# Patient Record
Sex: Male | Born: 1935
Health system: Southern US, Community
[De-identification: ages and names within clinical notes are randomized; demographics above are authoritative.]

## PROBLEM LIST (undated history)

## (undated) DIAGNOSIS — N189 Chronic kidney disease, unspecified: Secondary | ICD-10-CM

## (undated) DIAGNOSIS — M109 Gout, unspecified: Secondary | ICD-10-CM

## (undated) DIAGNOSIS — E785 Hyperlipidemia, unspecified: Secondary | ICD-10-CM

## (undated) DIAGNOSIS — R0600 Dyspnea, unspecified: Secondary | ICD-10-CM

## (undated) DIAGNOSIS — K219 Gastro-esophageal reflux disease without esophagitis: Secondary | ICD-10-CM

## (undated) DIAGNOSIS — R0609 Other forms of dyspnea: Secondary | ICD-10-CM

## (undated) DIAGNOSIS — J439 Emphysema, unspecified: Secondary | ICD-10-CM

## (undated) DIAGNOSIS — R9439 Abnormal result of other cardiovascular function study: Secondary | ICD-10-CM

## (undated) DIAGNOSIS — J449 Chronic obstructive pulmonary disease, unspecified: Secondary | ICD-10-CM

## (undated) DIAGNOSIS — I10 Essential (primary) hypertension: Secondary | ICD-10-CM

## (undated) HISTORY — DX: Chronic kidney disease, unspecified: N18.9

## (undated) HISTORY — DX: Gout, unspecified: M10.9

## (undated) HISTORY — DX: Abnormal result of other cardiovascular function study: R94.39

## (undated) HISTORY — DX: Chronic obstructive pulmonary disease, unspecified: J44.9

## (undated) HISTORY — PX: APPENDECTOMY: SHX54

## (undated) HISTORY — DX: Dyspnea, unspecified: R06.00

## (undated) HISTORY — DX: Other forms of dyspnea: R06.09

## (undated) HISTORY — DX: Essential (primary) hypertension: I10

## (undated) HISTORY — DX: Gastro-esophageal reflux disease without esophagitis: K21.9

## (undated) HISTORY — DX: Emphysema, unspecified: J43.9

## (undated) HISTORY — PX: HERNIA REPAIR: SHX51

## (undated) HISTORY — DX: Hyperlipidemia, unspecified: E78.5

---

## 2004-02-25 ENCOUNTER — Ambulatory Visit (HOSPITAL_COMMUNITY): Admission: RE | Admit: 2004-02-25 | Discharge: 2004-02-25 | Payer: Self-pay | Admitting: Gastroenterology

## 2011-07-19 ENCOUNTER — Other Ambulatory Visit: Payer: Self-pay | Admitting: Family Medicine

## 2012-11-15 ENCOUNTER — Encounter: Payer: Self-pay | Admitting: Cardiology

## 2013-05-21 ENCOUNTER — Encounter: Payer: Self-pay | Admitting: Cardiology

## 2013-09-03 ENCOUNTER — Encounter (INDEPENDENT_AMBULATORY_CARE_PROVIDER_SITE_OTHER): Payer: Self-pay

## 2013-09-03 ENCOUNTER — Encounter: Payer: Self-pay | Admitting: Cardiology

## 2013-09-03 ENCOUNTER — Ambulatory Visit (INDEPENDENT_AMBULATORY_CARE_PROVIDER_SITE_OTHER): Payer: Commercial Managed Care - HMO | Admitting: Cardiology

## 2013-09-03 VITALS — BP 142/80 | HR 79 | Ht 72.0 in | Wt 200.2 lb

## 2013-09-03 DIAGNOSIS — R0609 Other forms of dyspnea: Secondary | ICD-10-CM

## 2013-09-03 DIAGNOSIS — E785 Hyperlipidemia, unspecified: Secondary | ICD-10-CM | POA: Insufficient documentation

## 2013-09-03 DIAGNOSIS — I10 Essential (primary) hypertension: Secondary | ICD-10-CM | POA: Insufficient documentation

## 2013-09-03 DIAGNOSIS — R079 Chest pain, unspecified: Secondary | ICD-10-CM

## 2013-09-03 NOTE — Patient Instructions (Signed)
We will schedule you for a stress myoview study.   

## 2013-09-03 NOTE — Progress Notes (Signed)
   Charles Archer Date of Birth: 07/04/1936 Medical Record #[id number]  History of Present Illness: [name] is seen at the request of Dr. Tenny Craw for evaluation of dyspnea. He is a pleasant 77 year old white male who has a history of hyperlipidemia and hypertension. He reports that he has had slowly progressive dyspnea on exertion for the past 5 years. He does have a history of tobacco abuse. He quit smoking in 1990 but prior to that had smoked as much as 3 packs per day. He reports he was tried on Symbicort without any improvement. He has also been on a nitroglycerin patch for the past month without any improvement. He denies any significant chest pain or discomfort. He has no cough. He admits that he is fairly sedentary. The extent of his exertion is when he walks from his cough cart up to the green while playing golf.  Medications: Lisinopril 40 mg daily, Albuterol inhaler when necessary Niacin 250 mg daily Allopurinol 100 mg daily Symbicort 160/4.52 puffs twice a day Nitroglycerin patch 0.2 mg per hour daily Patient is on multiple over-the-counter supplements including vitamin B complex, Gingko biloba, zinc, Osteo Bi-Flex  Past Medical History  Diagnosis Date  . HTN (hypertension)   . Hyperlipidemia   . Gout   . GERD (gastroesophageal reflux disease)   . CKD (chronic kidney disease)     Past Surgical History  Procedure Laterality Date  . Hernia repair    . Appendectomy      History  Smoking status  . Former Smoker  . Quit date: 09/03/1989  Smokeless tobacco  . Not on file    History  Alcohol Use: Not on file    History reviewed. No pertinent family history.  Review of Systems: As noted in history of present illness.  All other systems were reviewed and are negative.  Physical Exam: BP 142/80  Pulse 79  Ht 6' (1.829 m)  Wt 200 lb 4 oz (90.833 kg)  BMI 27.15 kg/m2 He is a pleasant overweight white male in no acute distress. HEENT: Normocephalic, atraumatic.  Pupils equal round and reactive light accommodation. Extraocular movements are full. Oropharynx is clear. Neck is supple no JVD, adenopathy, thyromegaly, or bruits. Lungs: Clear Cardiovascular: Regular rate and rhythm, normal S1 and S2, no gallop or murmur. PMI is normal. Abdomen: Soft and nontender. No masses or bruits. Bowel sounds are positive. Extremities: No cyanosis or edema. Pulses are 2+ and symmetric. Skin: Warm and dry Neuro: Alert oriented x3. Cranial nerves II through XII are intact.  LABORATORY DATA: ECG today demonstrates normal sinus rhythm with PACs. He has a right bundle branch block.  Assessment / Plan: 1. Dyspnea on exertion. I suspect this is mostly related to COPD and/or deconditioning. These could be anginal equivalent symptoms with history of hypertension and dyslipidemia. He reports that he has had chest x-ray and complete lab work with Dr. Tenny Craw. I do not have these results. We will schedule him for a stress Myoview study. If this is normal then I would recommend further treatment for his COPD and a regular aerobic exercise program. 2. Hypertension-controlled 3. Mild dyslipidemia.

## 2013-09-19 ENCOUNTER — Encounter: Payer: Self-pay | Admitting: Cardiology

## 2013-09-19 ENCOUNTER — Ambulatory Visit (HOSPITAL_COMMUNITY): Payer: Medicare HMO | Attending: Cardiovascular Disease | Admitting: Radiology

## 2013-09-19 VITALS — BP 116/90 | HR 84 | Ht 72.0 in | Wt 198.0 lb

## 2013-09-19 DIAGNOSIS — R079 Chest pain, unspecified: Secondary | ICD-10-CM

## 2013-09-19 DIAGNOSIS — J449 Chronic obstructive pulmonary disease, unspecified: Secondary | ICD-10-CM | POA: Insufficient documentation

## 2013-09-19 DIAGNOSIS — Z87891 Personal history of nicotine dependence: Secondary | ICD-10-CM | POA: Insufficient documentation

## 2013-09-19 DIAGNOSIS — I4510 Unspecified right bundle-branch block: Secondary | ICD-10-CM | POA: Insufficient documentation

## 2013-09-19 DIAGNOSIS — E785 Hyperlipidemia, unspecified: Secondary | ICD-10-CM | POA: Insufficient documentation

## 2013-09-19 DIAGNOSIS — R0602 Shortness of breath: Secondary | ICD-10-CM

## 2013-09-19 DIAGNOSIS — J4489 Other specified chronic obstructive pulmonary disease: Secondary | ICD-10-CM | POA: Insufficient documentation

## 2013-09-19 DIAGNOSIS — I10 Essential (primary) hypertension: Secondary | ICD-10-CM | POA: Insufficient documentation

## 2013-09-19 DIAGNOSIS — R0989 Other specified symptoms and signs involving the circulatory and respiratory systems: Secondary | ICD-10-CM | POA: Insufficient documentation

## 2013-09-19 DIAGNOSIS — R0609 Other forms of dyspnea: Secondary | ICD-10-CM | POA: Insufficient documentation

## 2013-09-19 MED ORDER — TECHNETIUM TC 99M SESTAMIBI GENERIC - CARDIOLITE
11.0000 | Freq: Once | INTRAVENOUS | Status: AC | PRN
  Administered 2013-09-19: 11 via INTRAVENOUS

## 2013-09-19 MED ORDER — TECHNETIUM TC 99M SESTAMIBI GENERIC - CARDIOLITE
33.0000 | Freq: Once | INTRAVENOUS | Status: AC | PRN
  Administered 2013-09-19: 33 via INTRAVENOUS

## 2013-09-19 NOTE — Progress Notes (Signed)
[facility] SITE [address] NUCLEAR MED [address] [place], [place] [address] [phone]    Cardiology Nuclear Med Study  Charles Archer is a 77 y.o. male     MRN : [id number]     DOB: 23-Aug-1936  Procedure Date: 09/19/2013  Nuclear Med Background Indication for Stress Test:  Evaluation for Ischemia History:  No known CAD, COPD Cardiac Risk Factors: History of Smoking, Hypertension, Lipids and RBBB  Symptoms:  DOE   Nuclear Pre-Procedure Caffeine/Decaff Intake:  None > 12 hrs NPO After: 7:00am   Lungs:  clear O2 Sat: 94% on room air. IV 0.9% NS with Angio Cath:  22g  IV Site: R Antecubital x 1, tolerated well IV Started by:  Irean Hong, RN  Chest Size (in):  42 Cup Size: n/a  Height: 6' (1.829 m)  Weight:  198 lb (89.812 kg)  BMI:  Body mass index is 26.85 kg/(m^2). Tech Comments:  Took am medications    Nuclear Med Study 1 or 2 day study: 1 day  Stress Test Type:  Stress  Reading MD: Tobias Alexander, MD  Order Authorizing Provider:  Peter Swaziland, MD  Resting Radionuclide: Technetium 12m Sestamibi  Resting Radionuclide Dose: 11.0 mCi   Stress Radionuclide:  Technetium 43m Sestamibi  Stress Radionuclide Dose: 33.0 mCi           Stress Protocol Rest HR: 84 Stress HR: 142  Rest BP: 116/90 Stress BP: 176/79  Exercise Time (min): 4:00 METS: 4.6           Dose of Adenosine (mg):  n/a Dose of Lexiscan: n/a mg  Dose of Atropine (mg): n/a Dose of Dobutamine: n/a mcg/kg/min (at max HR)  Stress Test Technologist: Nelson Chimes, BS-ES  Nuclear Technologist:  Dario Guardian, CNMT     Rest Procedure:  Myocardial perfusion imaging was performed at rest 45 minutes following the intravenous administration of Technetium 66m Sestamibi. Rest ECG: NSR with RBBB  Stress Procedure:  The patient exercised on the treadmill utilizing the Bruce Protocol for 4:00 minutes. The patient stopped due to severe SOB and denied any chest pain.  Technetium 66m Sestamibi was injected at peak  exercise and myocardial perfusion imaging was performed after a brief delay. Stress ECG: Insignificant upsloping ST depression anterolateral leads.   QPS Raw Data Images:  Normal; no motion artifact; normal heart/lung ratio. Stress Images:  Medium-sized, moderate basal to mid inferior perfusion defect.  Rest Images:  Medium-sized, moderate basal to mid inferior perfusion defect.  Subtraction (SDS):  Fixed, medium-sized basal to mid inferior perfusion defect. Transient Ischemic Dilatation (Normal <1.22):  0.98 Lung/Heart Ratio (Normal <0.45):  0.26  Quantitative Gated Spect Images QGS EDV:  n/a ml QGS ESV:  n/a ml  Impression Exercise Capacity:  Poor exercise capacity. BP Response:  Normal blood pressure response. Clinical Symptoms:  Very dyspneic.  ECG Impression:  Insignificant upsloping ST segment depression. Comparison with Prior Nuclear Study: No images to compare  Overall Impression:  Low risk stress nuclear study with a fixed, medium-sized moderate basal to mid inferior perfusion defect.  No ischemia. This may represent prior inferior MI.  LV Ejection Fraction: Study not gated.  LV Wall Motion:  ungated study.   Marca Ancona 09/22/2013

## 2013-09-26 ENCOUNTER — Other Ambulatory Visit: Payer: Self-pay

## 2013-09-26 DIAGNOSIS — R079 Chest pain, unspecified: Secondary | ICD-10-CM

## 2013-10-15 ENCOUNTER — Ambulatory Visit (HOSPITAL_COMMUNITY): Payer: Medicare HMO | Attending: Cardiology | Admitting: Radiology

## 2013-10-15 DIAGNOSIS — R072 Precordial pain: Secondary | ICD-10-CM

## 2013-10-15 DIAGNOSIS — E785 Hyperlipidemia, unspecified: Secondary | ICD-10-CM | POA: Insufficient documentation

## 2013-10-15 DIAGNOSIS — Z87891 Personal history of nicotine dependence: Secondary | ICD-10-CM | POA: Insufficient documentation

## 2013-10-15 DIAGNOSIS — I079 Rheumatic tricuspid valve disease, unspecified: Secondary | ICD-10-CM | POA: Insufficient documentation

## 2013-10-15 DIAGNOSIS — I10 Essential (primary) hypertension: Secondary | ICD-10-CM | POA: Insufficient documentation

## 2013-10-15 DIAGNOSIS — I359 Nonrheumatic aortic valve disorder, unspecified: Secondary | ICD-10-CM | POA: Insufficient documentation

## 2013-10-15 DIAGNOSIS — R079 Chest pain, unspecified: Secondary | ICD-10-CM

## 2013-10-15 DIAGNOSIS — R0609 Other forms of dyspnea: Secondary | ICD-10-CM | POA: Insufficient documentation

## 2013-10-15 DIAGNOSIS — R0989 Other specified symptoms and signs involving the circulatory and respiratory systems: Secondary | ICD-10-CM

## 2013-10-15 NOTE — Progress Notes (Signed)
Echocardiogram performed.  

## 2013-10-20 ENCOUNTER — Encounter: Payer: Self-pay | Admitting: Cardiology

## 2013-10-20 ENCOUNTER — Ambulatory Visit (INDEPENDENT_AMBULATORY_CARE_PROVIDER_SITE_OTHER): Payer: Medicare HMO | Admitting: Cardiology

## 2013-10-20 VITALS — BP 140/76 | HR 90 | Ht 72.0 in | Wt 205.0 lb

## 2013-10-20 DIAGNOSIS — R0609 Other forms of dyspnea: Secondary | ICD-10-CM

## 2013-10-20 DIAGNOSIS — R9439 Abnormal result of other cardiovascular function study: Secondary | ICD-10-CM

## 2013-10-20 DIAGNOSIS — I10 Essential (primary) hypertension: Secondary | ICD-10-CM

## 2013-10-20 DIAGNOSIS — R0989 Other specified symptoms and signs involving the circulatory and respiratory systems: Secondary | ICD-10-CM

## 2013-10-20 DIAGNOSIS — E785 Hyperlipidemia, unspecified: Secondary | ICD-10-CM

## 2013-10-20 NOTE — Progress Notes (Signed)
Charles Archer Date of Birth: January 08, 1936 Medical Record #[id number]  History of Present Illness: Charles Archer is seen for follow up of dyspnea. He  has a history of hyperlipidemia and hypertension. He reports that he has had slowly progressive dyspnea on exertion for the past 5 years. He does have a history of tobacco abuse. He quit smoking in 1990 but prior to that had smoked as much as 3 packs per day. He reports he was tried on Symbicort without any improvement. He has also been on a nitroglycerin patch for the past month without any improvement. He denies any significant chest pain or discomfort. He has no cough. He admits that he is fairly sedentary. He recently underwent cardiac evaluation with a stress Myoview and Echo as noted below.  Medications: Lisinopril 40 mg daily, Albuterol inhaler when necessary Niacin 250 mg daily Allopurinol 100 mg daily Symbicort 160/4.52 puffs twice a day Nitroglycerin patch 0.2 mg per hour daily Patient is on multiple over-the-counter supplements including vitamin B complex, Gingko biloba, zinc, Osteo Bi-Flex  Past Medical History  Diagnosis Date  . HTN (hypertension)   . Hyperlipidemia   . Gout   . GERD (gastroesophageal reflux disease)   . CKD (chronic kidney disease)   . Dyspnea on exertion   . COPD (chronic obstructive pulmonary disease)     Past Surgical History  Procedure Laterality Date  . Hernia repair    . Appendectomy      History  Smoking status  . Former Smoker  . Quit date: 09/03/1989  Smokeless tobacco  . Not on file    History  Alcohol Use: Not on file    History reviewed. No pertinent family history.  Review of Systems: As noted in history of present illness.  All other systems were reviewed and are negative.  Physical Exam: BP 140/76  Pulse 90  Ht 6' (1.829 m)  Wt 205 lb (92.987 kg)  BMI 27.80 kg/m2 He is a pleasant overweight white male in no acute distress. HEENT: Normocephalic, atraumatic. Pupils equal  round and reactive light accommodation. Extraocular movements are full. Oropharynx is clear. Neck is supple no JVD, adenopathy, thyromegaly, or bruits. Lungs: Clear Cardiovascular: Regular rate and rhythm, normal S1 and S2, no gallop or murmur. PMI is normal. Abdomen: Soft and nontender. No masses or bruits. Bowel sounds are positive. Extremities: No cyanosis or edema. Pulses are 2+ and symmetric. Skin: Warm and dry Neuro: Alert oriented x3. Cranial nerves II through XII are intact.  LABORATORY DATA: ECG today demonstrates normal sinus rhythm with PACs. He has a right bundle branch block.  Echo:Study Conclusions  - Left ventricle: The cavity size was normal. Wall thickness was increased in a pattern of mild LVH. The estimated ejection fraction was 45%. Diffuse hypokinesis. The basal inferior wall looks worse than other wall segments. Features are consistent with a pseudonormal left ventricular filling pattern, with concomitant abnormal relaxation and increased filling pressure (grade 2 diastolic dysfunction). - Aortic valve: There was no stenosis. Trivial regurgitation. - Mitral valve: No significant regurgitation. - Right ventricle: Systolic function was mildly reduced. - Tricuspid valve: Peak RV-RA gradient: 24mm Hg (S). - Pulmonary arteries: PA systolic pressure 30-34 mmHg. - Systemic veins: IVC measured 2.3 cm with normal respirophasic variation, suggesting RA pressure 6-10 mmHg. Impressions:  - Normal LV size with mild LV hypertrophy. EF 45% with diffuse hypokinesis (somewhat worse in the basal inferior wall). Moderate diastolic dysfunction. Normal RV size with mildly decreased systolic function. No significant valvular  abnormalities.  Cardiology Nuclear Med Study  Charles Archer is a 78 y.o. male MRN : [id number] DOB: 11-18-1935  Procedure Date: 09/19/2013  Nuclear Med Background  Indication for Stress Test: Evaluation for Ischemia  History: No known CAD, COPD  Cardiac  Risk Factors: History of Smoking, Hypertension, Lipids and RBBB  Symptoms: DOE  Nuclear Pre-Procedure  Caffeine/Decaff Intake: None > 12 hrs  NPO After: 7:00am   Lungs: clear  O2 Sat: 94% on room air.  IV 0.9% NS with Angio Cath: 22g   IV Site: R Antecubital x 1, tolerated well  IV Started by: [name], RN   Chest Size (in): 42  Cup Size: n/a   Height: 6' (1.829 m)  Weight: 198 lb (89.812 kg)   BMI: Body mass index is 26.85 kg/(m^2).  Tech Comments: Took am medications   Nuclear Med Study  1 or 2 day study: 1 day  Stress Test Type: Stress   Reading MD: [name], MD  Order Authorizing Provider: [name], MD   Resting Radionuclide: Technetium 7442m Sestamibi  Resting Radionuclide Dose: 11.0 mCi   Stress Radionuclide: Technetium 3142m Sestamibi  Stress Radionuclide Dose: 33.0 mCi   Stress Protocol  Rest HR: 84  Stress HR: 142   Rest BP: 116/90  Stress BP: 176/79   Exercise Time (min): 4:00  METS: 4.6    Dose of Adenosine (mg): n/a  Dose of Lexiscan: n/a mg   Dose of Atropine (mg): n/a  Dose of Dobutamine: n/a mcg/kg/min (at max HR)   Stress Test Technologist: [name], BS-ES  Nuclear Technologist: [name], CNMT   Rest Procedure: Myocardial perfusion imaging was performed at rest 45 minutes following the intravenous administration of Technetium 8542m Sestamibi.  Rest ECG: NSR with RBBB  Stress Procedure: The patient exercised on the treadmill utilizing the Bruce Protocol for 4:00 minutes. The patient stopped due to severe SOB and denied any chest pain. Technetium 4842m Sestamibi was injected at peak exercise and myocardial perfusion imaging was performed after a brief delay.  Stress ECG: Insignificant upsloping ST depression anterolateral leads.  QPS  Raw Data Images: Normal; no motion artifact; normal heart/lung ratio.  Stress Images: Medium-sized, moderate basal to mid inferior perfusion defect.  Rest Images: Medium-sized, moderate basal to mid inferior perfusion defect.   Subtraction (SDS): Fixed, medium-sized basal to mid inferior perfusion defect.  Transient Ischemic Dilatation (Normal <1.22): 0.98  Lung/Heart Ratio (Normal <0.45): 0.26  Quantitative Gated Spect Images  QGS EDV: n/a ml  QGS ESV: n/a ml  Impression  Exercise Capacity: Poor exercise capacity.  BP Response: Normal blood pressure response.  Clinical Symptoms: Very dyspneic.  ECG Impression: Insignificant upsloping ST segment depression.  Comparison with Prior Nuclear Study: No images to compare  Overall Impression: Low risk stress nuclear study with a fixed, medium-sized moderate basal to mid inferior perfusion defect. No ischemia. This may represent prior inferior MI.  LV Ejection Fraction: Study not gated. LV Wall Motion: ungated study.  Marca AnconaDalton Archer  09/22/2013     Assessment / Plan: 1. Dyspnea on exertion. Recent noninvasive cardiac evaluation demonstrates at least intermediate risk with poor exercise tolerance, fixed inferior wall defect consistent with infarct, and decreased LV systolic function. I have recommended a cardiac cath to further evaluate his coronary status. Recommended he start an ASA daily. Given his CKD will have him arrive early for IV hydration. The procedure and risks were reviewed including but not limited to death, myocardial infarction, stroke, arrythmias, bleeding, transfusion, emergency surgery,  dye allergy, or renal dysfunction. The patient voices understanding and is agreeable to proceed.. 2. Hypertension-controlled 3. Mild dyslipidemia. 4. CKD. Awaiting labs.

## 2013-10-20 NOTE — Patient Instructions (Signed)
Will schedule you for a cardiac catheterization. Hold lisinopril the day before and the day of the procedure.   Start ASA 81 mg daily.

## 2013-10-20 NOTE — Addendum Note (Signed)
Addended by: Tonita PhoenixBOWDEN, Kwabena Strutz K on: 10/20/2013 04:32 PM   Modules accepted: Orders

## 2013-10-21 ENCOUNTER — Encounter (HOSPITAL_COMMUNITY): Payer: Self-pay | Admitting: Pharmacy Technician

## 2013-10-21 LAB — CBC WITH DIFFERENTIAL/PLATELET
BASOS PCT: 0.7 % (ref 0.0–3.0)
Basophils Absolute: 0 10*3/uL (ref 0.0–0.1)
EOS PCT: 4 % (ref 0.0–5.0)
Eosinophils Absolute: 0.3 10*3/uL (ref 0.0–0.7)
HEMATOCRIT: 46.3 % (ref 39.0–52.0)
HEMOGLOBIN: 15.8 g/dL (ref 13.0–17.0)
Lymphocytes Relative: 37.6 % (ref 12.0–46.0)
Lymphs Abs: 2.6 10*3/uL (ref 0.7–4.0)
MCHC: 34.1 g/dL (ref 30.0–36.0)
MCV: 95.4 fl (ref 78.0–100.0)
MONOS PCT: 5.3 % (ref 3.0–12.0)
Monocytes Absolute: 0.4 10*3/uL (ref 0.1–1.0)
NEUTROS ABS: 3.6 10*3/uL (ref 1.4–7.7)
Neutrophils Relative %: 52.4 % (ref 43.0–77.0)
Platelets: 189 10*3/uL (ref 150.0–400.0)
RBC: 4.85 Mil/uL (ref 4.22–5.81)
RDW: 14.8 % — ABNORMAL HIGH (ref 11.5–14.6)
WBC: 6.8 10*3/uL (ref 4.5–10.5)

## 2013-10-21 LAB — BASIC METABOLIC PANEL
BUN: 16 mg/dL (ref 6–23)
CO2: 27 mEq/L (ref 19–32)
Calcium: 9.2 mg/dL (ref 8.4–10.5)
Chloride: 109 mEq/L (ref 96–112)
Creatinine, Ser: 1.5 mg/dL (ref 0.4–1.5)
GFR: 50.31 mL/min — AB (ref >60.00)
GLUCOSE: 104 mg/dL — AB (ref 70–99)
POTASSIUM: 4.3 meq/L (ref 3.5–5.1)
SODIUM: 142 meq/L (ref 135–145)

## 2013-10-21 LAB — PROTIME-INR
INR: 1.1 ratio — ABNORMAL HIGH (ref 0.8–1.0)
Prothrombin Time: 12.1 s (ref 10.2–12.4)

## 2013-10-22 ENCOUNTER — Encounter (HOSPITAL_COMMUNITY)
Admission: RE | Disposition: A | Discharge status: Home / Self Care | Payer: Self-pay | Source: Ambulatory Visit | Attending: Cardiology

## 2013-10-22 ENCOUNTER — Ambulatory Visit (HOSPITAL_COMMUNITY)
Admission: RE | Admit: 2013-10-22 | Discharge: 2013-10-22 | Disposition: A | Discharge status: Home / Self Care | Payer: Medicare HMO | Source: Ambulatory Visit | Attending: Cardiology | Admitting: Cardiology

## 2013-10-22 DIAGNOSIS — R9439 Abnormal result of other cardiovascular function study: Secondary | ICD-10-CM | POA: Insufficient documentation

## 2013-10-22 DIAGNOSIS — I2510 Atherosclerotic heart disease of native coronary artery without angina pectoris: Secondary | ICD-10-CM | POA: Insufficient documentation

## 2013-10-22 DIAGNOSIS — E785 Hyperlipidemia, unspecified: Secondary | ICD-10-CM | POA: Insufficient documentation

## 2013-10-22 DIAGNOSIS — R0989 Other specified symptoms and signs involving the circulatory and respiratory systems: Principal | ICD-10-CM | POA: Insufficient documentation

## 2013-10-22 DIAGNOSIS — J4489 Other specified chronic obstructive pulmonary disease: Secondary | ICD-10-CM | POA: Insufficient documentation

## 2013-10-22 DIAGNOSIS — J449 Chronic obstructive pulmonary disease, unspecified: Secondary | ICD-10-CM | POA: Insufficient documentation

## 2013-10-22 DIAGNOSIS — Z87891 Personal history of nicotine dependence: Secondary | ICD-10-CM | POA: Insufficient documentation

## 2013-10-22 DIAGNOSIS — N189 Chronic kidney disease, unspecified: Secondary | ICD-10-CM | POA: Insufficient documentation

## 2013-10-22 DIAGNOSIS — K219 Gastro-esophageal reflux disease without esophagitis: Secondary | ICD-10-CM | POA: Insufficient documentation

## 2013-10-22 DIAGNOSIS — R0609 Other forms of dyspnea: Secondary | ICD-10-CM | POA: Insufficient documentation

## 2013-10-22 DIAGNOSIS — I129 Hypertensive chronic kidney disease with stage 1 through stage 4 chronic kidney disease, or unspecified chronic kidney disease: Secondary | ICD-10-CM | POA: Insufficient documentation

## 2013-10-22 DIAGNOSIS — M109 Gout, unspecified: Secondary | ICD-10-CM | POA: Insufficient documentation

## 2013-10-22 HISTORY — PX: LEFT HEART CATHETERIZATION WITH CORONARY ANGIOGRAM: SHX5451

## 2013-10-22 SURGERY — LEFT HEART CATHETERIZATION WITH CORONARY ANGIOGRAM
Anesthesia: LOCAL

## 2013-10-22 MED ORDER — VERAPAMIL HCL 2.5 MG/ML IV SOLN
INTRAVENOUS | Status: AC
  Filled 2013-10-22: qty 2

## 2013-10-22 MED ORDER — FENTANYL CITRATE 0.05 MG/ML IJ SOLN
INTRAMUSCULAR | Status: AC
  Filled 2013-10-22: qty 2

## 2013-10-22 MED ORDER — SODIUM CHLORIDE 0.9 % IV SOLN: 250.0000 mL | INTRAVENOUS | Status: DC | PRN

## 2013-10-22 MED ORDER — SODIUM CHLORIDE 0.9 % IV SOLN: 1.0000 mL/kg/h | INTRAVENOUS | Status: DC

## 2013-10-22 MED ORDER — ASPIRIN 81 MG PO CHEW: 81.0000 mg | CHEWABLE_TABLET | ORAL | Status: DC

## 2013-10-22 MED ORDER — MIDAZOLAM HCL 2 MG/2ML IJ SOLN
INTRAMUSCULAR | Status: AC
  Filled 2013-10-22: qty 2

## 2013-10-22 MED ORDER — SODIUM CHLORIDE 0.9 % IJ SOLN: 3.0000 mL | Freq: Two times a day (BID) | INTRAMUSCULAR | Status: DC

## 2013-10-22 MED ORDER — SODIUM CHLORIDE 0.9 % IJ SOLN: 3.0000 mL | INTRAMUSCULAR | Status: DC | PRN

## 2013-10-22 MED ORDER — LIDOCAINE HCL (PF) 1 % IJ SOLN
INTRAMUSCULAR | Status: AC
  Filled 2013-10-22: qty 30

## 2013-10-22 MED ORDER — HEPARIN SODIUM (PORCINE) 1000 UNIT/ML IJ SOLN
INTRAMUSCULAR | Status: AC
  Filled 2013-10-22: qty 1

## 2013-10-22 MED ORDER — NITROGLYCERIN 0.2 MG/ML ON CALL CATH LAB
INTRAVENOUS | Status: AC
  Filled 2013-10-22: qty 1

## 2013-10-22 MED ORDER — HEPARIN (PORCINE) IN NACL 2-0.9 UNIT/ML-% IJ SOLN
INTRAMUSCULAR | Status: AC
  Filled 2013-10-22: qty 1500

## 2013-10-22 MED ORDER — SODIUM CHLORIDE 0.9 % IV SOLN
INTRAVENOUS | Status: DC
  Administered 2013-10-22: 07:00:00 via INTRAVENOUS

## 2013-10-22 NOTE — CV Procedure (Signed)
    Cardiac Catheterization Procedure Note  Name: Charles AtesDaniel A Archer MRN: [id number] DOB: [birth year]  Procedure: Left Heart Cath, Selective Coronary Angiography  Indication: 78 yo WM presents with symptoms of dyspnea on exertion. Myoview study demonstrates a fixed inferior wall defect. Echo shows an EF of 45% with global hypokinesis.   Procedural Details: The right wrist was prepped, draped, and anesthetized with 1% lidocaine. Using the modified Seldinger technique, a 5 French sheath was introduced into the right radial artery. 3 mg of verapamil was administered through the sheath, weight-based unfractionated heparin was administered intravenously. Standard Judkins catheters were used for selective coronary angiography. Catheter exchanges were performed over an exchange length guidewire. There were no immediate procedural complications. A TR band was used for radial hemostasis at the completion of the procedure.  The patient was transferred to the post catheterization recovery area for further monitoring.  Procedural Findings: Hemodynamics: AO 137/67 mean 101 mm Hg LV 138/12 mm Hg  Coronary angiography: Coronary dominance: right  Left mainstem: Normal  Left anterior descending (LAD): The proximal vessel is ectatic. There are diffuse irregularities less than 10-20%. The first diagonal is a large branch and is normal.  Left circumflex (LCx): The LCX gives rise to a single large OM. It is normal.  Right coronary artery (RCA): There is mild ectasia in the mid vessel followed by a segment with 20-30% disease.  Left ventriculography: Not done.  Final Conclusions:   1. Nonobstructive CAD 2. Normal LV filling pressures.  Recommendations: Based on his cath data I do not feel his dyspnea is cardiac related. May need to consider pulmonary evaluation.  Theron Aristaeter [facility] HospitalJordanMD,FACC 10/22/2013, 12:02 PM

## 2013-10-22 NOTE — Discharge Instructions (Signed)

## 2013-10-22 NOTE — Research (Signed)
[facility] Research Study Informed Consent   Subject Name: Charles Archer  Subject met inclusion and exclusion criteria.  The informed consent form, study requirements and expectations were reviewed with the subject and questions and concerns were addressed prior to the signing of the consent form.  The subject verbalized understanding of the trail requirements.  The subject agreed to participate in the [place] trial and signed the informed consent at [place] on 10/22/2013.  The informed consent was obtained prior to performance of any protocol-specific procedures for the subject.  A copy of the signed informed consent was given to the subject and a copy was placed in the subject's medical record.  Blossom Hoops 10/22/2013, 8:38 AM

## 2013-10-22 NOTE — Interval H&P Note (Signed)
History and Physical Interval Note:  10/22/2013 11:36 AM  Charles Archer  has presented today for surgery, with the diagnosis of Chest pain  The various methods of treatment have been discussed with the patient and family. After consideration of risks, benefits and other options for treatment, the patient has consented to  Procedure(s): LEFT HEART CATHETERIZATION WITH CORONARY ANGIOGRAM (N/A) as a surgical intervention .  The patient's history has been reviewed, patient examined, no change in status, stable for surgery.  I have reviewed the patient's chart and labs.  Questions were answered to the patient's satisfaction.   Cath Lab Visit (complete for each Cath Lab visit)  Clinical Evaluation Leading to the Procedure:   ACS: no  Non-ACS:    Anginal Classification: CCS III  Anti-ischemic medical therapy: Minimal Therapy (1 class of medications)  Non-Invasive Test Results: Intermediate-risk stress test findings: cardiac mortality 1-3%/year  Prior CABG: No previous CABG        Theron Aristaeter [facility] HospitalJordanMD,FACC 10/22/2013 11:37 AM

## 2013-10-22 NOTE — H&P (View-Only) (Signed)
 Charles Archer Date of Birth: 11/13/1935 Medical Record #[id number]  History of Present Illness: Mr. Albrecht is seen for follow up of dyspnea. He  has a history of hyperlipidemia and hypertension. He reports that he has had slowly progressive dyspnea on exertion for the past 5 years. He does have a history of tobacco abuse. He quit smoking in 1990 but prior to that had smoked as much as 3 packs per day. He reports he was tried on Symbicort without any improvement. He has also been on a nitroglycerin patch for the past month without any improvement. He denies any significant chest pain or discomfort. He has no cough. He admits that he is fairly sedentary. He recently underwent cardiac evaluation with a stress Myoview and Echo as noted below.  Medications: Lisinopril 40 mg daily, Albuterol inhaler when necessary Niacin 250 mg daily Allopurinol 100 mg daily Symbicort 160/4.52 puffs twice a day Nitroglycerin patch 0.2 mg per hour daily Patient is on multiple over-the-counter supplements including vitamin B complex, Gingko biloba, zinc, Osteo Bi-Flex  Past Medical History  Diagnosis Date  . HTN (hypertension)   . Hyperlipidemia   . Gout   . GERD (gastroesophageal reflux disease)   . CKD (chronic kidney disease)   . Dyspnea on exertion   . COPD (chronic obstructive pulmonary disease)     Past Surgical History  Procedure Laterality Date  . Hernia repair    . Appendectomy      History  Smoking status  . Former Smoker  . Quit date: 09/03/1989  Smokeless tobacco  . Not on file    History  Alcohol Use: Not on file    History reviewed. No pertinent family history.  Review of Systems: As noted in history of present illness.  All other systems were reviewed and are negative.  Physical Exam: BP 140/76  Pulse 90  Ht 6' (1.829 m)  Wt 205 lb (92.987 kg)  BMI 27.80 kg/m2 He is a pleasant overweight white male in no acute distress. HEENT: Normocephalic, atraumatic. Pupils equal  round and reactive light accommodation. Extraocular movements are full. Oropharynx is clear. Neck is supple no JVD, adenopathy, thyromegaly, or bruits. Lungs: Clear Cardiovascular: Regular rate and rhythm, normal S1 and S2, no gallop or murmur. PMI is normal. Abdomen: Soft and nontender. No masses or bruits. Bowel sounds are positive. Extremities: No cyanosis or edema. Pulses are 2+ and symmetric. Skin: Warm and dry Neuro: Alert oriented x3. Cranial nerves II through XII are intact.  LABORATORY DATA: ECG today demonstrates normal sinus rhythm with PACs. He has a right bundle branch block.  Echo:Study Conclusions  - Left ventricle: The cavity size was normal. Wall thickness was increased in a pattern of mild LVH. The estimated ejection fraction was 45%. Diffuse hypokinesis. The basal inferior wall looks worse than other wall segments. Features are consistent with a pseudonormal left ventricular filling pattern, with concomitant abnormal relaxation and increased filling pressure (grade 2 diastolic dysfunction). - Aortic valve: There was no stenosis. Trivial regurgitation. - Mitral valve: No significant regurgitation. - Right ventricle: Systolic function was mildly reduced. - Tricuspid valve: Peak RV-RA gradient: 24mm Hg (S). - Pulmonary arteries: PA systolic pressure 30-34 mmHg. - Systemic veins: IVC measured 2.3 cm with normal respirophasic variation, suggesting RA pressure 6-10 mmHg. Impressions:  - Normal LV size with mild LV hypertrophy. EF 45% with diffuse hypokinesis (somewhat worse in the basal inferior wall). Moderate diastolic dysfunction. Normal RV size with mildly decreased systolic function. No significant valvular   abnormalities.  Cardiology Nuclear Med Study  Delance A Knoll is a 78 y.o. male MRN : [id number] DOB: 01/27/1936  Procedure Date: 09/19/2013  Nuclear Med Background  Indication for Stress Test: Evaluation for Ischemia  History: No known CAD, COPD  Cardiac  Risk Factors: History of Smoking, Hypertension, Lipids and RBBB  Symptoms: DOE  Nuclear Pre-Procedure  Caffeine/Decaff Intake: None > 12 hrs  NPO After: 7:00am   Lungs: clear  O2 Sat: 94% on room air.  IV 0.9% NS with Angio Cath: 22g   IV Site: R Antecubital x 1, tolerated well  IV Started by: Patsy Edwards, RN   Chest Size (in): 42  Cup Size: n/a   Height: 6' (1.829 m)  Weight: 198 lb (89.812 kg)   BMI: Body mass index is 26.85 kg/(m^2).  Tech Comments: Took am medications   Nuclear Med Study  1 or 2 day study: 1 day  Stress Test Type: Stress   Reading MD: Katarina Nelson, MD  Order Authorizing Provider: Peter Jordan, MD   Resting Radionuclide: Technetium 99m Sestamibi  Resting Radionuclide Dose: 11.0 mCi   Stress Radionuclide: Technetium 99m Sestamibi  Stress Radionuclide Dose: 33.0 mCi   Stress Protocol  Rest HR: 84  Stress HR: 142   Rest BP: 116/90  Stress BP: 176/79   Exercise Time (min): 4:00  METS: 4.6    Dose of Adenosine (mg): n/a  Dose of Lexiscan: n/a mg   Dose of Atropine (mg): n/a  Dose of Dobutamine: n/a mcg/kg/min (at max HR)   Stress Test Technologist: Sharon Brooks, BS-ES  Nuclear Technologist: Wendy Bryson, CNMT   Rest Procedure: Myocardial perfusion imaging was performed at rest 45 minutes following the intravenous administration of Technetium 99m Sestamibi.  Rest ECG: NSR with RBBB  Stress Procedure: The patient exercised on the treadmill utilizing the Bruce Protocol for 4:00 minutes. The patient stopped due to severe SOB and denied any chest pain. Technetium 99m Sestamibi was injected at peak exercise and myocardial perfusion imaging was performed after a brief delay.  Stress ECG: Insignificant upsloping ST depression anterolateral leads.  QPS  Raw Data Images: Normal; no motion artifact; normal heart/lung ratio.  Stress Images: Medium-sized, moderate basal to mid inferior perfusion defect.  Rest Images: Medium-sized, moderate basal to mid inferior perfusion defect.   Subtraction (SDS): Fixed, medium-sized basal to mid inferior perfusion defect.  Transient Ischemic Dilatation (Normal <1.22): 0.98  Lung/Heart Ratio (Normal <0.45): 0.26  Quantitative Gated Spect Images  QGS EDV: n/a ml  QGS ESV: n/a ml  Impression  Exercise Capacity: Poor exercise capacity.  BP Response: Normal blood pressure response.  Clinical Symptoms: Very dyspneic.  ECG Impression: Insignificant upsloping ST segment depression.  Comparison with Prior Nuclear Study: No images to compare  Overall Impression: Low risk stress nuclear study with a fixed, medium-sized moderate basal to mid inferior perfusion defect. No ischemia. This may represent prior inferior MI.  LV Ejection Fraction: Study not gated. LV Wall Motion: ungated study.  Dalton McLean  09/22/2013     Assessment / Plan: 1. Dyspnea on exertion. Recent noninvasive cardiac evaluation demonstrates at least intermediate risk with poor exercise tolerance, fixed inferior wall defect consistent with infarct, and decreased LV systolic function. I have recommended a cardiac cath to further evaluate his coronary status. Recommended he start an ASA daily. Given his CKD will have him arrive early for IV hydration. The procedure and risks were reviewed including but not limited to death, myocardial infarction, stroke, arrythmias, bleeding, transfusion, emergency surgery,   dye allergy, or renal dysfunction. The patient voices understanding and is agreeable to proceed.. 2. Hypertension-controlled 3. Mild dyslipidemia. 4. CKD. Awaiting labs.  

## 2013-11-05 ENCOUNTER — Encounter: Payer: Self-pay | Admitting: Nurse Practitioner

## 2013-11-05 ENCOUNTER — Ambulatory Visit (INDEPENDENT_AMBULATORY_CARE_PROVIDER_SITE_OTHER): Payer: Medicare HMO | Admitting: Nurse Practitioner

## 2013-11-05 VITALS — BP 120/68 | HR 90 | Ht 72.0 in | Wt 202.2 lb

## 2013-11-05 DIAGNOSIS — R0600 Dyspnea, unspecified: Secondary | ICD-10-CM

## 2013-11-05 DIAGNOSIS — R0609 Other forms of dyspnea: Secondary | ICD-10-CM

## 2013-11-05 DIAGNOSIS — R0989 Other specified symptoms and signs involving the circulatory and respiratory systems: Secondary | ICD-10-CM

## 2013-11-05 NOTE — Progress Notes (Signed)
Charles Archer Date of Birth: 24-Apr-1936 Medical Record #[id number]  History of Present Illness: Charles Archer is seen back today for a post cath visit. Seen for Charles Archer. He has HLD and HLD. Has had progressive DOE with past tobacco abuse - quite in 1990 but had smoked as much as 3 packs per day.   Had had recent Myoview and echo - led to cardiac cath - this showed non obstructive disease with normal LV function. His dyspnea was not felt to be cardiac related. Pulmonary referral recommended.   Comes back today. Here alone. Still short of breath - can only walk about 10 yards and then gets short of breath. Uses some inhalers with no real improvement. Never seen pulmonary and is willing to go. No problems with his cath site.    Current Outpatient Prescriptions  Medication Sig Dispense Refill  . acetaminophen (TYLENOL ARTHRITIS PAIN) 650 MG CR tablet Take 650 mg by mouth every 8 (eight) hours as needed for pain.      Marland Kitchen. albuterol (PROVENTIL HFA) 108 (90 BASE) MCG/ACT inhaler Inhale 1 puff into the lungs every 4 (four) hours as needed for wheezing or shortness of breath.       . allopurinol (ZYLOPRIM) 100 MG tablet Take 100 mg by mouth daily.      Marland Kitchen. aspirin 81 MG tablet Take 81 mg by mouth daily.      . B Complex-Folic Acid (SUPER B COMPLEX MAXI PO) Take 1 tablet by mouth daily.       . calcium carbonate (OS-CAL) 600 MG TABS tablet Take 600 mg by mouth daily.      . Chlorpheniramine Maleate (ALLERGY RELIEF PO) Take 10 mg by mouth as needed.      . Cinnamon 500 MG capsule Take 1,000 mg by mouth daily.      Marland Kitchen. docusate sodium (COLACE) 50 MG capsule Take 50 mg by mouth daily as needed for mild constipation.       . fluorouracil (EFUDEX) 5 % cream Apply 5 % topically 2 (two) times daily.      . Ginkgo Biloba 40 MG TABS Take 40 mg by mouth daily.       Marland Kitchen. lisinopril (PRINIVIL,ZESTRIL) 40 MG tablet Take 40 mg by mouth daily.      . Misc Natural Products (OSTEO BI-FLEX ADV JOINT SHIELD PO) Take 1,500 mg  by mouth daily.      . Multiple Vitamin (MULTIVITAMIN) capsule Take 1 capsule by mouth daily.      . Naproxen Sodium (ALEVE PO) Take 220 mg by mouth as needed (for pain).       . niacin 250 MG tablet Take 250 mg by mouth at bedtime.      . vitamin B-12 (CYANOCOBALAMIN) 1000 MCG tablet Take 1,000 mcg by mouth daily.      Marland Kitchen. zinc gluconate 50 MG tablet Take 50 mg by mouth daily.      . nitroGLYCERIN (NITRODUR - DOSED IN MG/24 HR) 0.2 mg/hr patch Place 0.2 mg onto the skin daily.        No current facility-administered medications for this visit.    No Known Allergies  Past Medical History  Diagnosis Date  . HTN (hypertension)   . Hyperlipidemia   . Gout   . GERD (gastroesophageal reflux disease)   . CKD (chronic kidney disease)   . Dyspnea on exertion   . COPD (chronic obstructive pulmonary disease)   . Abnormal stress test  s/p cardiac cath 10/2013 - nonobstructive CAD with normal LV function    Past Surgical History  Procedure Laterality Date  . Hernia repair    . Appendectomy      History  Smoking status  . Former Smoker  . Quit date: 09/03/1989  Smokeless tobacco  . Not on file    History  Alcohol Use: Not on file    History reviewed. No pertinent family history.  Review of Systems: The review of systems is per the HPI.  All other systems were reviewed and are negative.  Physical Exam: BP 120/68  Pulse 90  Ht 6' (1.829 m)  Wt 202 lb 3.2 oz (91.717 kg)  BMI 27.42 kg/m2  SpO2 96% Patient is very pleasant and in no acute distress. Skin is warm and dry. Color is normal.  HEENT is unremarkable. Normocephalic/atraumatic. PERRL. Sclera are nonicteric. Neck is supple. No masses. No JVD. Lungs are clear. Cardiac exam shows a regular rate and rhythm. Occasional ectopics.  Abdomen is soft. Extremities are without edema. Gait and ROM are intact. No gross neurologic deficits noted.  LABORATORY DATA: EKG today shows sinus with PVCs - RBBB  Lab Results  Component  Value Date   WBC 6.8 10/20/2013   HGB 15.8 10/20/2013   HCT 46.3 10/20/2013   PLT 189.0 10/20/2013   GLUCOSE 104* 10/20/2013   NA 142 10/20/2013   K 4.3 10/20/2013   CL 109 10/20/2013   CREATININE 1.5 10/20/2013   BUN 16 10/20/2013   CO2 27 10/20/2013   INR 1.1* 10/20/2013   Echo Study Conclusions  - Left ventricle: The cavity size was normal. Wall thickness was increased in a pattern of mild LVH. The estimated ejection fraction was 45%. Diffuse hypokinesis. The basal inferior wall looks worse than other wall segments. Features are consistent with a pseudonormal left ventricular filling pattern, with concomitant abnormal relaxation and increased filling pressure (grade 2 diastolic dysfunction). - Aortic valve: There was no stenosis. Trivial regurgitation. - Mitral valve: No significant regurgitation. - Right ventricle: Systolic function was mildly reduced. - Tricuspid valve: Peak RV-RA gradient: 24mm Hg (S). - Pulmonary arteries: PA systolic pressure 30-34 mmHg. - Systemic veins: IVC measured 2.3 cm with normal respirophasic variation, suggesting RA pressure 6-10 mmHg.  Myoview Overall Impression: Low risk stress nuclear study with a fixed, medium-sized moderate basal to mid inferior perfusion defect. No ischemia. This may represent prior inferior MI.  LV Ejection Fraction: Study not gated. LV Wall Motion: ungated study.  Charles Archer  09/22/2013  Coronary angiography:  Coronary dominance: right  Left mainstem: Normal  Left anterior descending (LAD): The proximal vessel is ectatic. There are diffuse irregularities less than 10-20%. The first diagonal is a large branch and is normal.  Left circumflex (LCx): The LCX gives rise to a single large OM. It is normal.  Right coronary artery (RCA): There is mild ectasia in the mid vessel followed by a segment with 20-30% disease.  Left ventriculography: Not done.  Final Conclusions:  1. Nonobstructive CAD  2. Normal LV filling pressures.    Recommendations: Based on his cath data I do not feel his dyspnea is cardiac related. May need to consider pulmonary evaluation.  Charles Arista [facility]  10/22/2013, 12:02 PM    Assessment / Plan:  1. Post cath - showing non obstructive CAD - normal LV function - favor CV risk factor modification.   2. Dyspnea - not felt to be cardiac related - favor pulmonary - he is asking for referral.  3. HTN - BP looks good  4. HLD  We will see back prn. Referral to pulmonary. No change in medicines.   Patient is agreeable to this plan and will call if any problems develop in the interim.   Rosalio Macadamia, RN, ANP-C [facility] [address] [place], [place]  [address] [phone]

## 2013-11-05 NOTE — Patient Instructions (Addendum)
Your heart cath looks good with normal pumping function of your heart and very minimal blockage.   I am going to refer you to the lung doctor - pulmonary referral - anyone except Dr. Sherene SiresWert  We will see you back as needed  Call the [facility] office at [phone] if you have any questions, problems or concerns.

## 2013-11-11 ENCOUNTER — Ambulatory Visit (INDEPENDENT_AMBULATORY_CARE_PROVIDER_SITE_OTHER): Payer: Medicare HMO | Admitting: Emergency Medicine

## 2013-11-11 ENCOUNTER — Encounter: Payer: Self-pay | Admitting: Emergency Medicine

## 2013-11-11 VITALS — BP 124/76 | HR 83 | Ht 72.0 in | Wt 203.0 lb

## 2013-11-11 DIAGNOSIS — R0989 Other specified symptoms and signs involving the circulatory and respiratory systems: Secondary | ICD-10-CM

## 2013-11-11 DIAGNOSIS — R0609 Other forms of dyspnea: Secondary | ICD-10-CM

## 2013-11-11 NOTE — Patient Instructions (Signed)
We will perform full pulmonary function testing at your next visit Walking oximetry today Continue to have ventolin available to use as needed Follow with Dr Delton CoombesByrum next available with full PFT.

## 2013-11-11 NOTE — Progress Notes (Signed)
Subjective:    Patient ID: Charles Archer, male    DOB: 05/02/1936, 78 y.o.   MRN: [id number]  HPI 78 yo former smoker (90 pk-yrs), hx of HTN, hyperlipemia. Has been evaluated at [facility] for DOE. Stress testing and L heart cath done 09/23/13 and 10/22/13 > non-critical CAD, overall reassuring. His SOB has been slowly progressive over 5 yrs, now limiting. Unable to walk hills. He does still golf but clearly a change. No CP, no cough or wheeze.  He was treated with prednisone x 1 in the summer for CP, now ascribed to GERD. He does have ventolin that he uses rarely for cough.    Review of Systems  Constitutional: Negative for fever and unexpected weight change.  HENT: Negative for congestion, dental problem, ear pain, nosebleeds, postnasal drip, rhinorrhea, sinus pressure, sneezing, sore throat and trouble swallowing.   Eyes: Negative for redness and itching.  Respiratory: Positive for cough and shortness of breath. Negative for chest tightness and wheezing.   Cardiovascular: Negative for palpitations and leg swelling.  Gastrointestinal: Positive for abdominal distention. Negative for nausea and vomiting.       Heartburn  Genitourinary: Negative for dysuria.  Musculoskeletal: Positive for joint swelling.       Stiffness  Skin: Negative for rash.  Neurological: Negative for headaches.  Hematological: Bruises/bleeds easily.  Psychiatric/Behavioral: Negative for dysphoric mood. The patient is not nervous/anxious.     Past Medical History  Diagnosis Date  . HTN (hypertension)   . Hyperlipidemia   . Gout   . GERD (gastroesophageal reflux disease)   . CKD (chronic kidney disease)   . Dyspnea on exertion   . COPD (chronic obstructive pulmonary disease)   . Abnormal stress test     s/p cardiac cath 10/2013 - nonobstructive CAD with normal LV function  . Emphysema lung      Family History  Problem Relation Age of Onset  . Emphysema Father   . Asthma Father      History    Social History  . Marital Status: Married    Spouse Name: N/A    Number of Children: 2  . Years of Education: N/A   Occupational History  . retired Print production planner    Social History Main Topics  . Smoking status: Former Smoker -- 3.00 packs/day for 30 years    Types: Cigarettes    Quit date: 09/03/1989  . Smokeless tobacco: Not on file  . Alcohol Use: No  . Drug Use: No  . Sexual Activity: Not on file   Other Topics Concern  . Not on file   Social History Narrative  . 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 acetaminophen (TYLENOL ARTHRITIS PAIN) 650 MG CR tablet Take 650 mg by mouth every 8 (eight) hours as needed for pain.      Marland Kitchen albuterol (PROVENTIL HFA) 108 (90 BASE) MCG/ACT inhaler Inhale 1 puff into the lungs every 4 (four) hours as needed for wheezing or shortness of breath.       . allopurinol (ZYLOPRIM) 100 MG tablet Take 100 mg by mouth daily.      Marland Kitchen aspirin 81 MG tablet Take 81 mg by mouth daily.      . B Complex-Folic Acid (SUPER B COMPLEX MAXI PO) Take 1 tablet by mouth daily.       Marland Kitchen  calcium carbonate (OS-CAL) 600 MG TABS tablet Take 600 mg by mouth daily.      . Chlorpheniramine Maleate (ALLERGY RELIEF PO) Take 10 mg by mouth as needed.      . Cinnamon 500 MG capsule Take 1,000 mg by mouth daily.      Marland Kitchen. docusate sodium (COLACE) 50 MG capsule Take 50 mg by mouth daily as needed for mild constipation.       . fluorouracil (EFUDEX) 5 % cream Apply 5 % topically 2 (two) times daily.      . Ginkgo Biloba 40 MG TABS Take 40 mg by mouth daily.       Marland Kitchen. lisinopril (PRINIVIL,ZESTRIL) 40 MG tablet Take 40 mg by mouth daily.      . Misc Natural Products (OSTEO BI-FLEX ADV JOINT SHIELD PO) Take 1,500 mg by mouth daily.      . Multiple Vitamin (MULTIVITAMIN) capsule Take 1 capsule by mouth daily.      . Naproxen Sodium  (ALEVE PO) Take 220 mg by mouth as needed (for pain).       . niacin 250 MG tablet Take 250 mg by mouth at bedtime.      . vitamin B-12 (CYANOCOBALAMIN) 1000 MCG tablet Take 1,000 mcg by mouth daily.      Marland Kitchen. zinc gluconate 50 MG tablet Take 50 mg by mouth daily.       No facility-administered medications prior to visit.       Objective:   Physical Exam Filed Vitals:   11/11/13 1007  BP: 124/76  Pulse: 83  Height: 6' (1.829 m)  Weight: 203 lb (92.08 kg)  SpO2: 96%   Gen: Pleasant, well-nourished, in no distress,  normal affect  ENT: No lesions,  mouth clear,  oropharynx clear, no postnasal drip  Neck: No JVD, no TMG, no carotid bruits  Lungs: No use of accessory muscles, no dullness to percussion, clear without rales or rhonchi  Cardiovascular: RRR, heart sounds normal, no murmur or gallops, no peripheral edema  Musculoskeletal: No deformities, no cyanosis or clubbing  Neuro: alert, non focal  Skin: Warm, no lesions or rashes      Assessment & Plan:  Dyspnea on exertion Reassuring cardiac eval. Agree that he likely has some degree COPD. Need to quantify.  - full PFT - walking oximetry today (states that he had desat at Dr Charlott Rakesoss's office on one occasion) - rov next avail

## 2013-11-11 NOTE — Assessment & Plan Note (Signed)
Reassuring cardiac eval. Agree that he likely has some degree COPD. Need to quantify.  - full PFT - walking oximetry today (states that he had desat at Dr Charlott Rakesoss's office on one occasion) - rov next avail

## 2013-12-15 ENCOUNTER — Other Ambulatory Visit: Payer: Self-pay | Admitting: Emergency Medicine

## 2013-12-15 DIAGNOSIS — R0602 Shortness of breath: Secondary | ICD-10-CM

## 2013-12-16 ENCOUNTER — Ambulatory Visit (INDEPENDENT_AMBULATORY_CARE_PROVIDER_SITE_OTHER): Payer: Medicare HMO | Admitting: Emergency Medicine

## 2013-12-16 ENCOUNTER — Encounter: Payer: Self-pay | Admitting: Emergency Medicine

## 2013-12-16 VITALS — BP 120/78 | HR 83 | Ht 71.0 in | Wt 200.0 lb

## 2013-12-16 DIAGNOSIS — R0989 Other specified symptoms and signs involving the circulatory and respiratory systems: Secondary | ICD-10-CM

## 2013-12-16 DIAGNOSIS — R0602 Shortness of breath: Secondary | ICD-10-CM

## 2013-12-16 DIAGNOSIS — J449 Chronic obstructive pulmonary disease, unspecified: Secondary | ICD-10-CM

## 2013-12-16 DIAGNOSIS — R0609 Other forms of dyspnea: Secondary | ICD-10-CM

## 2013-12-16 LAB — PULMONARY FUNCTION TEST
DL/VA % pred: 35 %
DL/VA: 1.66 ml/min/mmHg/L
DLCO UNC: 10.84 ml/min/mmHg
DLCO unc % pred: 32 %
FEF 25-75 Post: 1.19 L/sec
FEF 25-75 Pre: 0.97 L/sec
FEF2575-%CHANGE-POST: 21 %
FEF2575-%Pred-Post: 52 %
FEF2575-%Pred-Pre: 42 %
FEV1-%Change-Post: 6 %
FEV1-%PRED-POST: 75 %
FEV1-%Pred-Pre: 70 %
FEV1-POST: 2.37 L
FEV1-Pre: 2.22 L
FEV1FVC-%CHANGE-POST: 2 %
FEV1FVC-%Pred-Pre: 72 %
FEV6-%Change-Post: 5 %
FEV6-%PRED-PRE: 99 %
FEV6-%Pred-Post: 104 %
FEV6-PRE: 4.06 L
FEV6-Post: 4.27 L
FEV6FVC-%Change-Post: 0 %
FEV6FVC-%PRED-POST: 102 %
FEV6FVC-%Pred-Pre: 102 %
FVC-%Change-Post: 4 %
FVC-%Pred-Post: 101 %
FVC-%Pred-Pre: 96 %
FVC-PRE: 4.23 L
FVC-Post: 4.44 L
POST FEV6/FVC RATIO: 96 %
PRE FEV6/FVC RATIO: 96 %
Post FEV1/FVC ratio: 53 %
Pre FEV1/FVC ratio: 52 %
RV % pred: 91 %
RV: 2.41 L
TLC % PRED: 100 %
TLC: 7.29 L

## 2013-12-16 NOTE — Progress Notes (Signed)
PFT done today. 

## 2013-12-16 NOTE — Assessment & Plan Note (Signed)
-   PFT support moderate AFL, will trial spiriva

## 2013-12-16 NOTE — Patient Instructions (Signed)
We will start Spiriva 1 inhalation daily We will perform a CT scan of your chest  Follow with Dr Delton CoombesByrum in 1 month to review

## 2013-12-16 NOTE — Progress Notes (Signed)
   Subjective:    Patient ID: Charles Archer, male    DOB: 1935/11/11, 78 y.o.   MRN: [id number]  HPI 78 yo former smoker (90 pk-yrs), hx of HTN, hyperlipemia. Has been [facility] at [facility] for DOE. Stress testing and L heart cath done 09/23/13 and 10/22/13 > non-critical CAD, overall reassuring. His SOB has been slowly progressive over 5 yrs, now limiting. Unable to walk hills. He does still golf but clearly a change. No CP, no cough or wheeze.  He was treated with prednisone x 1 in the summer for CP, now ascribed to GERD. He does have ventolin that he uses rarely for cough.   ROV 12/16/13 -- former smoker, seen for exertional dyspnea. He underwent PFT today >> moderate AFL, no BD response, normal volumes, significantly decreased DLCO.  No new sx, still with exertional SOB. No flares. Last time exertional hypoxemia.    Review of Systems  Constitutional: Negative for fever and unexpected weight change.  HENT: Negative for congestion, dental problem, ear pain, nosebleeds, postnasal drip, rhinorrhea, sinus pressure, sneezing, sore throat and trouble swallowing.   Eyes: Negative for redness and itching.  Respiratory: Positive for shortness of breath. Negative for cough, chest tightness and wheezing.   Cardiovascular: Negative for palpitations and leg swelling.  Gastrointestinal: Negative for nausea, vomiting and abdominal distention.       Heartburn  Genitourinary: Negative for dysuria.  Musculoskeletal: Positive for joint swelling.       Stiffness  Skin: Negative for rash.  Neurological: Negative for headaches.  Hematological: Bruises/bleeds easily.  Psychiatric/Behavioral: Negative for dysphoric mood. The patient is not nervous/anxious.        Objective:   Physical Exam Filed Vitals:   12/16/13 1324  BP: 120/78  Pulse: 83  Height: 5\' 11"  (1.803 m)  Weight: 200 lb (90.719 kg)  SpO2: 90%   Gen: Pleasant, well-nourished, in no distress,  normal affect  ENT: No lesions,   mouth clear,  oropharynx clear, no postnasal drip  Neck: No JVD, no TMG, no carotid bruits  Lungs: No use of accessory muscles, no dullness to percussion, clear without rales or rhonchi  Cardiovascular: RRR, heart sounds normal, no murmur or gallops, no peripheral edema  Musculoskeletal: No deformities, no cyanosis or clubbing  Neuro: alert, non focal  Skin: Warm, no lesions or rashes      Assessment & Plan:  COPD (chronic obstructive pulmonary disease) - PFT support moderate AFL, will trial spiriva   Dyspnea on exertion His hypoxemia seems out of proportion to his COPD. Will check CT chest to look for ILD in setting joint disease.

## 2013-12-16 NOTE — Assessment & Plan Note (Signed)
His hypoxemia seems out of proportion to his COPD. Will check CT chest to look for ILD in setting joint disease.

## 2013-12-24 ENCOUNTER — Ambulatory Visit (INDEPENDENT_AMBULATORY_CARE_PROVIDER_SITE_OTHER)
Admission: RE | Admit: 2013-12-24 | Discharge: 2013-12-24 | Disposition: A | Discharge status: Home / Self Care | Payer: Medicare HMO | Source: Ambulatory Visit | Attending: Emergency Medicine | Admitting: Emergency Medicine

## 2013-12-24 DIAGNOSIS — R0609 Other forms of dyspnea: Secondary | ICD-10-CM

## 2013-12-24 DIAGNOSIS — R0989 Other specified symptoms and signs involving the circulatory and respiratory systems: Secondary | ICD-10-CM

## 2014-01-19 ENCOUNTER — Ambulatory Visit (INDEPENDENT_AMBULATORY_CARE_PROVIDER_SITE_OTHER): Payer: Commercial Managed Care - HMO | Admitting: Emergency Medicine

## 2014-01-19 ENCOUNTER — Encounter: Payer: Self-pay | Admitting: Emergency Medicine

## 2014-01-19 VITALS — BP 110/70 | HR 112 | Ht 71.0 in | Wt 210.4 lb

## 2014-01-19 DIAGNOSIS — R0602 Shortness of breath: Secondary | ICD-10-CM

## 2014-01-19 DIAGNOSIS — J449 Chronic obstructive pulmonary disease, unspecified: Secondary | ICD-10-CM

## 2014-01-19 MED ORDER — ALBUTEROL SULFATE HFA 108 (90 BASE) MCG/ACT IN AERS: 2.0000 | INHALATION_SPRAY | Freq: Four times a day (QID) | RESPIRATORY_TRACT | Status: DC | PRN

## 2014-01-19 MED ORDER — TIOTROPIUM BROMIDE MONOHYDRATE 18 MCG IN CAPS: 18.0000 ug | ORAL_CAPSULE | Freq: Every day | RESPIRATORY_TRACT | Status: DC

## 2014-01-19 NOTE — Patient Instructions (Signed)
Return in 4 months to see Dr. Delton CoombesByrum Continue spiriva daily sent to pharm and proair Start on O2

## 2014-01-19 NOTE — Progress Notes (Signed)
Subjective:    Patient ID: Charles Archer, male    DOB: 1936-01-17, 78 y.o.   MRN: [id number]  HPI 78 yo former smoker (90 pk-yrs), hx of HTN, hyperlipemia. Has been evaluated at [facility] for DOE. Stress testing and L heart cath done 09/23/13 and 10/22/13 > non-critical CAD, overall reassuring. His SOB has been slowly progressive over 5 yrs, now limiting. Unable to walk hills. He does still golf but clearly a change. No CP, no cough or wheeze.  He was treated with prednisone x 1 in the summer for CP, now ascribed to GERD. He does have ventolin that he uses rarely for cough.   ROV 12/16/13 -- former smoker, seen for exertional dyspnea. He underwent PFT today >> moderate AFL, no BD response, normal volumes, significantly decreased DLCO.  No new sx, still with exertional SOB. No flares. Last time exertional hypoxemia.   ROV 01/19/14 -- returns for f/u COPD, former tobacco. Last time we started Spiriva. Also checked CT chest as his hypoxemia seemed out of proportion  to his COPD. He says that he has benfited from Spiriva. His exertional tolerance is better.    Review of Systems  Constitutional: Negative for fever and unexpected weight change.  HENT: Negative for congestion, dental problem, ear pain, nosebleeds, postnasal drip, rhinorrhea, sinus pressure, sneezing, sore throat and trouble swallowing.   Eyes: Negative for redness and itching.  Respiratory: Positive for shortness of breath. Negative for cough, chest tightness and wheezing.   Cardiovascular: Negative for palpitations and leg swelling.  Gastrointestinal: Negative for nausea, vomiting and abdominal distention.       Heartburn  Genitourinary: Negative for dysuria.  Musculoskeletal: Positive for joint swelling.       Stiffness  Skin: Negative for rash.  Neurological: Negative for headaches.  Hematological: Bruises/bleeds easily.  Psychiatric/Behavioral: Negative for dysphoric mood. The patient is not nervous/anxious.         Objective:   Physical Exam Filed Vitals:   01/19/14 1355  BP: 110/70  Pulse: 112  Height: 5\' 11"  (1.803 m)  Weight: 95.437 kg (210 lb 6.4 oz)  SpO2: 90%   Gen: Pleasant, well-nourished, in no distress,  normal affect  ENT: No lesions,  mouth clear,  oropharynx clear, no postnasal drip  Neck: No JVD, no TMG, no carotid bruits  Lungs: No use of accessory muscles, clear without rales or rhonchi  Cardiovascular: RRR, heart sounds normal, no murmur or gallops, no peripheral edema  Musculoskeletal: No deformities, no cyanosis or clubbing  Neuro: alert, non focal  Skin: Warm, no lesions or rashes   12/24/13 --  COMPARISON  No priors.  FINDINGS  Mediastinum: Heart size is normal. There is no significant  pericardial fluid, thickening or pericardial calcification. There is  atherosclerosis of the thoracic aorta, the great vessels of the  mediastinum and the coronary arteries, including calcified  atherosclerotic plaque in the left main, left anterior descending,  left circumflex and right coronary arteries. No pathologically  enlarged mediastinal or hilar lymph nodes. Please note that accurate  exclusion of hilar adenopathy is limited on noncontrast CT scans.  Esophagus is unremarkable in appearance.  Lungs/Pleura: Mild diffuse bronchial wall thickening. Severe  centrilobular and paraseptal emphysema with advanced bullous changes  throughout the upper lobes of the lungs bilaterally, as well as the  lateral aspect of the right lower lobe. High-resolution images  demonstrate no definite regions of subpleural reticulation,  parenchymal banding, traction bronchiectasis or frank honeycombing  to suggest superimposed interstitial  lung disease at this time. No  acute consolidative airspace disease. No pleural effusions. No  definite suspicious appearing pulmonary nodules or masses are noted.  Upper Abdomen: Atherosclerosis. Low-attenuation in the hepatic  parenchyma, compatible  with hepatic steatosis.  Musculoskeletal: Healing nondisplaced fractures of the anterior  aspects of the left sixth, seventh and eighth ribs. There are no  aggressive appearing lytic or blastic lesions noted in the  visualized portions of the skeleton.  IMPRESSION  1. No evidence of underlying interstitial lung disease.  2. Mild diffuse bronchial wall thickening with severe centrilobular  and paraseptal emphysema; imaging findings suggestive of advanced  COPD.  3. Atherosclerosis, including left main and 3 vessel coronary artery  disease. Assessment for potential risk factor modification, dietary  therapy or pharmacologic therapy may be warranted, if clinically  indicated.  4. Hepatic steatosis      Assessment & Plan:  COPD (chronic obstructive pulmonary disease) - will continue spiriva as he has benefited - will change ventolin to proair because he feels it does more for him - new start O2 w exertion.

## 2014-01-19 NOTE — Assessment & Plan Note (Signed)
-   will continue spiriva as he has benefited - will change ventolin to proair because he feels it does more for him - new start O2 w exertion.

## 2014-01-20 ENCOUNTER — Telehealth: Payer: Self-pay | Admitting: Emergency Medicine

## 2014-01-20 DIAGNOSIS — J449 Chronic obstructive pulmonary disease, unspecified: Secondary | ICD-10-CM

## 2014-01-20 NOTE — Telephone Encounter (Signed)
Pt is aware. Nothing further needed 

## 2014-01-20 NOTE — Telephone Encounter (Signed)
lmtcb x1 for pt Order has been placed with updated liter flow.

## 2014-01-20 NOTE — Telephone Encounter (Signed)
Pt returned triage's call.  Charles Archer ° °

## 2014-06-10 ENCOUNTER — Ambulatory Visit (INDEPENDENT_AMBULATORY_CARE_PROVIDER_SITE_OTHER): Payer: Commercial Managed Care - HMO | Admitting: Emergency Medicine

## 2014-06-10 ENCOUNTER — Encounter: Payer: Self-pay | Admitting: Emergency Medicine

## 2014-06-10 VITALS — BP 122/74 | HR 92 | Ht 71.0 in | Wt 205.0 lb

## 2014-06-10 DIAGNOSIS — Z23 Encounter for immunization: Secondary | ICD-10-CM

## 2014-06-10 DIAGNOSIS — J449 Chronic obstructive pulmonary disease, unspecified: Secondary | ICD-10-CM

## 2014-06-10 NOTE — Addendum Note (Signed)
Addended by: Velvet Bathe on: 06/10/2014 02:17 PM   Modules accepted: Orders

## 2014-06-10 NOTE — Patient Instructions (Signed)
Please continue your Spiriva daily Use ProAir 2 puffs as needed for shortness of breath Wear your oxygen with exertion We will give the Prevnar-13 Pneumonia shot today Please get the Flu Shot in the Fall Follow with Dr Delton Coombes in 6 months or sooner if you have any problems

## 2014-06-10 NOTE — Progress Notes (Signed)
Subjective:    Patient ID: Charles Archer, male    DOB: [birth year], 78 y.o.   MRN: [id number]  HPI 78 yo former smoker (90 pk-yrs), hx of HTN, hyperlipemia. Has been evaluated at [facility] for DOE. Stress testing and L heart cath done 09/23/13 and 10/22/13 > non-critical CAD, overall reassuring. His SOB has been slowly progressive over 5 yrs, now limiting. Unable to walk hills. He does still golf but clearly a change. No CP, no cough or wheeze.  He was treated with prednisone x 1 in the summer for CP, now ascribed to GERD. He does have ventolin that he uses rarely for cough.   ROV 12/16/13 -- former smoker, seen for exertional dyspnea. He underwent PFT today >> moderate AFL, no BD response, normal volumes, significantly decreased DLCO.  No new sx, still with exertional SOB. No flares. Last time exertional hypoxemia.   ROV 01/19/14 -- returns for f/u COPD, former tobacco. Last time we started Spiriva. Also checked CT chest as his hypoxemia seemed out of proportion  to his COPD. He says that he has benfited from Spiriva. His exertional tolerance is better.   ROV 06/10/14 -- follows for COPD, hypoxemia. We started O2 w exertion last visit. He believes proair is better than ventolin.  His breathing has been better, he is able to golf and exert some. Uses his O2 with any significant exertion. He has had the pneumovax, not clear whether he has had the Prevnar-13   Review of Systems  Constitutional: Negative for fever and unexpected weight change.  HENT: Negative for congestion, dental problem, ear pain, nosebleeds, postnasal drip, rhinorrhea, sinus pressure, sneezing, sore throat and trouble swallowing.   Eyes: Negative for redness and itching.  Respiratory: Positive for shortness of breath. Negative for cough, chest tightness and wheezing.   Cardiovascular: Negative for palpitations and leg swelling.  Gastrointestinal: Negative for nausea, vomiting and abdominal distention.       Heartburn   Genitourinary: Negative for dysuria.  Musculoskeletal: Negative for joint swelling.       Stiffness  Skin: Negative for rash.  Neurological: Negative for headaches.  Hematological: Does not bruise/bleed easily.  Psychiatric/Behavioral: Negative for dysphoric mood. The patient is not nervous/anxious.        Objective:   Physical Exam Filed Vitals:   06/10/14 1349  BP: 122/74  Pulse: 92  Height:  (1.803 m)  Weight: 205 lb (92.987 kg)  SpO2: 94%   Gen: Pleasant, well-nourished, in no distress,  normal affect  ENT: No lesions,  mouth clear,  oropharynx clear, no postnasal drip  Neck: No JVD, no TMG, no carotid bruits  Lungs: No use of accessory muscles, clear without rales or rhonchi  Cardiovascular: RRR, heart sounds normal, no murmur or gallops, no peripheral edema  Musculoskeletal: No deformities, no cyanosis or clubbing  Neuro: alert, non focal  Skin: Warm, no lesions or rashes   12/24/13 --  COMPARISON  No priors.  FINDINGS  Mediastinum: Heart size is normal. There is no significant  pericardial fluid, thickening or pericardial calcification. There is  atherosclerosis of the thoracic aorta, the great vessels of the  mediastinum and the coronary arteries, including calcified  atherosclerotic plaque in the left main, left anterior descending,  left circumflex and right coronary arteries. No pathologically  enlarged mediastinal or hilar lymph nodes. Please note that accurate  exclusion of hilar adenopathy is limited on noncontrast CT scans.  Esophagus is unremarkable in appearance.  Lungs/Pleura: Mild diffuse bronchial  wall thickening. Severe  centrilobular and paraseptal emphysema with advanced bullous changes  throughout the upper lobes of the lungs bilaterally, as well as the  lateral aspect of the right lower lobe. High-resolution images  demonstrate no definite regions of subpleural reticulation,  parenchymal banding, traction bronchiectasis or frank  honeycombing  to suggest superimposed interstitial lung disease at this time. No  acute consolidative airspace disease. No pleural effusions. No  definite suspicious appearing pulmonary nodules or masses are noted.  Upper Abdomen: Atherosclerosis. Low-attenuation in the hepatic  parenchyma, compatible with hepatic steatosis.  Musculoskeletal: Healing nondisplaced fractures of the anterior  aspects of the left sixth, seventh and eighth ribs. There are no  aggressive appearing lytic or blastic lesions noted in the  visualized portions of the skeleton.  IMPRESSION  1. No evidence of underlying interstitial lung disease.  2. Mild diffuse bronchial wall thickening with severe centrilobular  and paraseptal emphysema; imaging findings suggestive of advanced  COPD.  3. Atherosclerosis, including left main and 3 vessel coronary artery  disease. Assessment for potential risk factor modification, dietary  therapy or pharmacologic therapy may be warranted, if clinically  indicated.  4. Hepatic steatosis      Assessment & Plan:  COPD (chronic obstructive pulmonary disease) Please continue your Spiriva daily Use ProAir 2 puffs as needed for shortness of breath Wear your oxygen with exertion We will give the Prevnar-13 Pneumonia shot today Please get the Flu Shot in the Fall Follow with Dr Delton Coombes in 6 months or sooner if you have any problems

## 2014-06-10 NOTE — Assessment & Plan Note (Signed)
Please continue your Spiriva daily Use ProAir 2 puffs as needed for shortness of breath Wear your oxygen with exertion We will give the Prevnar-13 Pneumonia shot today Please get the Flu Shot in the Fall Follow with Dr Marieli Rudy in 6 months or sooner if you have any problems  

## 2014-09-07 ENCOUNTER — Other Ambulatory Visit: Payer: Self-pay | Admitting: Emergency Medicine

## 2014-09-07 ENCOUNTER — Telehealth: Payer: Self-pay | Admitting: Emergency Medicine

## 2014-09-08 ENCOUNTER — Other Ambulatory Visit: Payer: Self-pay | Admitting: Emergency Medicine

## 2014-09-08 NOTE — Telephone Encounter (Signed)
Spoke with pt and he states that the pharmacy called him and told him that it has been taken care of.  Nothing further needed.

## 2014-09-24 ENCOUNTER — Encounter (HOSPITAL_COMMUNITY): Payer: Self-pay | Admitting: Cardiology

## 2015-02-23 ENCOUNTER — Other Ambulatory Visit: Payer: Self-pay | Admitting: Emergency Medicine

## 2015-02-23 NOTE — Telephone Encounter (Signed)
Spiriva refilled #1 0 refills patient must schedule office visit.

## 2015-03-24 ENCOUNTER — Encounter: Payer: Self-pay | Admitting: Emergency Medicine

## 2015-03-24 ENCOUNTER — Ambulatory Visit (INDEPENDENT_AMBULATORY_CARE_PROVIDER_SITE_OTHER): Payer: Commercial Managed Care - HMO | Admitting: Emergency Medicine

## 2015-03-24 VITALS — BP 108/72 | HR 86 | Ht 71.0 in | Wt 194.0 lb

## 2015-03-24 DIAGNOSIS — J449 Chronic obstructive pulmonary disease, unspecified: Secondary | ICD-10-CM | POA: Diagnosis not present

## 2015-03-24 DIAGNOSIS — R05 Cough: Secondary | ICD-10-CM

## 2015-03-24 DIAGNOSIS — J309 Allergic rhinitis, unspecified: Secondary | ICD-10-CM

## 2015-03-24 DIAGNOSIS — R053 Chronic cough: Secondary | ICD-10-CM

## 2015-03-24 MED ORDER — FLUTICASONE PROPIONATE 50 MCG/ACT NA SUSP: 2.0000 | Freq: Every day | NASAL | Status: DC

## 2015-03-24 NOTE — Assessment & Plan Note (Addendum)
He is on a exacerbations. He is having more cough but he remains active As He Has His Oxygen. He Has Been Able to [employer]. Suspect That His Cough Is Related to allergic disease. We discussed an exercise routine today. [facility]. If he feels that he needs more observation or supervision then I will refer him to pulmonary rehabilitation

## 2015-03-24 NOTE — Patient Instructions (Addendum)
Please continue your Spiriva.  Please continue your oxygen as you have been using it Continue loratadine 10mg  daily Try using fluticasone nasal spray, 2 sprays each nostril once a day during the allergy season If her cough does not improve in the next several weeks and please call our office. We may need to temporarily stop your lisinopril to help your cough resolve Follow with Dr Delton CoombesByrum in 6 months or sooner if you have any problems

## 2015-03-24 NOTE — Assessment & Plan Note (Signed)
I believe that we can improve his cough by more aggressively treating allergies. If his cough persists then we will likely need to perform a trial off of lisinopril to see if he benefits

## 2015-03-24 NOTE — Progress Notes (Signed)
Subjective:    Patient ID: Charles Archer, male    DOB: October 19, 1935, 79 y.o.   MRN: [id number]  HPI 79 yo former smoker (90 pk-yrs), hx of HTN, hyperlipemia. Has been evaluated at [facility] for DOE. Stress testing and L heart cath done 09/23/13 and 10/22/13 > non-critical CAD, overall reassuring. His SOB has been slowly progressive over 5 yrs, now limiting. Unable to walk hills. He does still golf but clearly a change. No CP, no cough or wheeze.  He was treated with prednisone x 1 in the summer for CP, now ascribed to GERD. He does have ventolin that he uses rarely for cough.   ROV 12/16/13 -- former smoker, seen for exertional dyspnea. He underwent PFT today >> moderate AFL, no BD response, normal volumes, significantly decreased DLCO.  No new sx, still with exertional SOB. No flares. Last time exertional hypoxemia.   ROV 01/19/14 -- returns for f/u COPD, former tobacco. Last time we started Spiriva. Also checked CT chest as his hypoxemia seemed out of proportion  to his COPD. He says that he has benfited from Spiriva. His exertional tolerance is better.   ROV 06/10/14 -- follows for COPD, hypoxemia. We started O2 w exertion last visit. He believes proair is better than ventolin.  His breathing has been better, he is able to golf and exert some. Uses his O2 with any significant exertion. He has had the pneumovax, not clear whether he has had the Prevnar-13  ROV 03/24/15 -- follow-up visit for COPD and chronic hypoxic respiratory failure. He has been having congestion for about 2 weeks, some cough minimally productive of clear. He is on lisinopril, has always tolerated it.  He has been taking loratadine qd, has chlorpheniramine on his list but has not been using. Occasional GERD sx.    Review of Systems  Constitutional: Negative for fever and unexpected weight change.  HENT: Positive for congestion. Negative for dental problem, ear pain, nosebleeds, postnasal drip, rhinorrhea, sinus pressure,  sneezing, sore throat and trouble swallowing.   Eyes: Negative for redness and itching.  Respiratory: Positive for cough. Negative for chest tightness, shortness of breath and wheezing.   Cardiovascular: Negative for palpitations and leg swelling.  Gastrointestinal: Negative for nausea, vomiting and abdominal distention.  Genitourinary: Negative for dysuria.  Musculoskeletal: Negative for joint swelling.  Skin: Negative for rash.  Neurological: Negative for headaches.  Hematological: Does not bruise/bleed easily.  Psychiatric/Behavioral: Negative for dysphoric mood. The patient is not nervous/anxious.        Objective:   Physical Exam Filed Vitals:   03/24/15 1424  BP: 108/72  Pulse: 86  Height: 5\' 11"  (1.803 m)  Weight: 194 lb (87.998 kg)  SpO2: 95%   Gen: Pleasant, well-nourished, in no distress,  normal affect  ENT: No lesions,  mouth clear,  oropharynx clear, no postnasal drip  Neck: No JVD, no TMG, no carotid bruits  Lungs: No use of accessory muscles, clear without rales or rhonchi  Cardiovascular: RRR, heart sounds normal, no murmur or gallops, no peripheral edema  Musculoskeletal: No deformities, no cyanosis or clubbing  Neuro: alert, non focal  Skin: Warm, no lesions or rashes   12/24/13 --  COMPARISON  No priors.  FINDINGS  Mediastinum: Heart size is normal. There is no significant  pericardial fluid, thickening or pericardial calcification. There is  atherosclerosis of the thoracic aorta, the great vessels of the  mediastinum and the coronary arteries, including calcified  atherosclerotic plaque in the left main,  left anterior descending,  left circumflex and right coronary arteries. No pathologically  enlarged mediastinal or hilar lymph nodes. Please note that accurate  exclusion of hilar adenopathy is limited on noncontrast CT scans.  Esophagus is unremarkable in appearance.  Lungs/Pleura: Mild diffuse bronchial wall thickening. Severe  centrilobular  and paraseptal emphysema with advanced bullous changes  throughout the upper lobes of the lungs bilaterally, as well as the  lateral aspect of the right lower lobe. High-resolution images  demonstrate no definite regions of subpleural reticulation,  parenchymal banding, traction bronchiectasis or frank honeycombing  to suggest superimposed interstitial lung disease at this time. No  acute consolidative airspace disease. No pleural effusions. No  definite suspicious appearing pulmonary nodules or masses are noted.  Upper Abdomen: Atherosclerosis. Low-attenuation in the hepatic  parenchyma, compatible with hepatic steatosis.  Musculoskeletal: Healing nondisplaced fractures of the anterior  aspects of the left sixth, seventh and eighth ribs. There are no  aggressive appearing lytic or blastic lesions noted in the  visualized portions of the skeleton.  IMPRESSION  1. No evidence of underlying interstitial lung disease.  2. Mild diffuse bronchial wall thickening with severe centrilobular  and paraseptal emphysema; imaging findings suggestive of advanced  COPD.  3. Atherosclerosis, including left main and 3 vessel coronary artery  disease. Assessment for potential risk factor modification, dietary  therapy or pharmacologic therapy may be warranted, if clinically  indicated.  4. Hepatic steatosis      Assessment & Plan:  COPD (chronic obstructive pulmonary disease) He is on a exacerbations. He is having more cough but he remains active As He Has His Oxygen. He Has Been Able to [employer]. Suspect That His Cough Is Related to allergic disease. We discussed an exercise routine today. He is going to go to the [facility]. If he feels that he needs more observation or supervision then I will refer him to pulmonary rehabilitation  Allergic rhinitis I have asked him to continue his loratadine every day. We will also try adding fluticasone nasal spray daily to see if his cough improves with more aggressive  treatment of his allergies.   Chronic cough I believe that we can improve his cough by more aggressively treating allergies. If his cough persists then we will likely need to perform a trial off of lisinopril to see if he benefits

## 2015-03-24 NOTE — Assessment & Plan Note (Signed)
I have asked him to continue his loratadine every day. We will also try adding fluticasone nasal spray daily to see if his cough improves with more aggressive treatment of his allergies.

## 2015-03-30 ENCOUNTER — Other Ambulatory Visit: Payer: Self-pay | Admitting: Emergency Medicine

## 2015-07-15 ENCOUNTER — Other Ambulatory Visit: Payer: Self-pay | Admitting: Emergency Medicine

## 2015-10-05 ENCOUNTER — Ambulatory Visit (INDEPENDENT_AMBULATORY_CARE_PROVIDER_SITE_OTHER): Payer: Commercial Managed Care - HMO | Admitting: Emergency Medicine

## 2015-10-05 ENCOUNTER — Encounter: Payer: Self-pay | Admitting: Emergency Medicine

## 2015-10-05 VITALS — BP 128/74 | HR 87 | Ht 71.0 in | Wt 196.6 lb

## 2015-10-05 DIAGNOSIS — J449 Chronic obstructive pulmonary disease, unspecified: Secondary | ICD-10-CM | POA: Diagnosis not present

## 2015-10-05 DIAGNOSIS — J309 Allergic rhinitis, unspecified: Secondary | ICD-10-CM | POA: Diagnosis not present

## 2015-10-05 DIAGNOSIS — R05 Cough: Secondary | ICD-10-CM

## 2015-10-05 DIAGNOSIS — R053 Chronic cough: Secondary | ICD-10-CM

## 2015-10-05 NOTE — Assessment & Plan Note (Signed)
Continue  your loratdine daily Use fluticasone nasal spray as needed. We will plan to use it every day during the spring.

## 2015-10-05 NOTE — Assessment & Plan Note (Signed)
Appears to be well managed at this time. I do not believe we need to stop lisinopril for now

## 2015-10-05 NOTE — Assessment & Plan Note (Signed)
Please continue your Spiriva daily, try converting to the Respimat version 2 puffs daily Use albuterol 2 puffs as needed.  Flu Shot up to date.  Follow with Dr Delton CoombesByrum in 6 months or sooner if you have any problems

## 2015-10-05 NOTE — Progress Notes (Signed)
Subjective:    Patient ID: Charles Archer, male    DOB: 1936-02-07, 79 y.o.   MRN: [id number]  HPI 79 yo former smoker (90 pk-yrs), hx of HTN, hyperlipemia. Has been evaluated at [facility] for DOE. Stress testing and L heart cath done 09/23/13 and 10/22/13 > non-critical CAD, overall reassuring. His SOB has been slowly progressive over 5 yrs, now limiting. Unable to walk hills. He does still golf but clearly a change. No CP, no cough or wheeze.  He was treated with prednisone x 1 in the summer for CP, now ascribed to GERD. He does have ventolin that he uses rarely for cough.   ROV 12/16/13 -- former smoker, seen for exertional dyspnea. He underwent PFT today >> moderate AFL, no BD response, normal volumes, significantly decreased DLCO.  No new sx, still with exertional SOB. No flares. Last time exertional hypoxemia.   ROV 01/19/14 -- returns for f/u COPD, former tobacco. Last time we started Spiriva. Also checked CT chest as his hypoxemia seemed out of proportion  to his COPD. He says that he has benfited from Spiriva. His exertional tolerance is better.   ROV 06/10/14 -- follows for COPD, hypoxemia. We started O2 w exertion last visit. He believes proair is better than ventolin.  His breathing has been better, he is able to golf and exert some. Uses his O2 with any significant exertion. He has had the pneumovax, not clear whether he has had the Prevnar-13  ROV 03/24/15 -- follow-up visit for COPD and chronic hypoxic respiratory failure. He has been having congestion for about 2 weeks, some cough minimally productive of clear. He is on lisinopril, has always tolerated it.  He has been taking loratadine qd, has chlorpheniramine on his list but has not been using. Occasional GERD sx.   ROV 10/05/15 -- follow up for COPD, chronic hypoxemic respiratory failure. Allergic rhinitis , chronic cough. At his last visit we added fluticasone nasal spray to loratadine. Continued Spiriva.  He benefited from the  flonase, still uses prn. He feels that his breathing has been fairly stable, may have been worse this Fall. He has used albuterol before - helps him. Uses it about 2x a week. Remains on lisinopril.    Review of Systems  Constitutional: Negative for fever and unexpected weight change.  HENT: Positive for congestion. Negative for dental problem, ear pain, nosebleeds, postnasal drip, rhinorrhea, sinus pressure, sneezing, sore throat and trouble swallowing.   Eyes: Negative for redness and itching.  Respiratory: Positive for cough. Negative for chest tightness, shortness of breath and wheezing.   Cardiovascular: Negative for palpitations and leg swelling.  Gastrointestinal: Negative for nausea, vomiting and abdominal distention.  Genitourinary: Negative for dysuria.  Musculoskeletal: Negative for joint swelling.  Skin: Negative for rash.  Neurological: Negative for headaches.  Hematological: Does not bruise/bleed easily.  Psychiatric/Behavioral: Negative for dysphoric mood. The patient is not nervous/anxious.        Objective:   Physical Exam Filed Vitals:   10/05/15 1416  BP: 128/74  Pulse: 87  Height:  (1.803 m)  Weight: 196 lb 9.6 oz (89.177 kg)  SpO2: 94%   Gen: Pleasant, well-nourished, in no distress,  normal affect  ENT: No lesions,  mouth clear,  oropharynx clear, no postnasal drip  Neck: No JVD, no TMG, no carotid bruits  Lungs: No use of accessory muscles, clear without rales or rhonchi  Cardiovascular: RRR, heart sounds normal, no murmur or gallops, no peripheral edema  Musculoskeletal:  No deformities, no cyanosis or clubbing  Neuro: alert, non focal  Skin: Warm, no lesions or rashes   12/24/13 --  COMPARISON  No priors.  FINDINGS  Mediastinum: Heart size is normal. There is no significant  pericardial fluid, thickening or pericardial calcification. There is  atherosclerosis of the thoracic aorta, the great vessels of the  mediastinum and the coronary  arteries, including calcified  atherosclerotic plaque in the left main, left anterior descending,  left circumflex and right coronary arteries. No pathologically  enlarged mediastinal or hilar lymph nodes. Please note that accurate  exclusion of hilar adenopathy is limited on noncontrast CT scans.  Esophagus is unremarkable in appearance.  Lungs/Pleura: Mild diffuse bronchial wall thickening. Severe  centrilobular and paraseptal emphysema with advanced bullous changes  throughout the upper lobes of the lungs bilaterally, as well as the  lateral aspect of the right lower lobe. High-resolution images  demonstrate no definite regions of subpleural reticulation,  parenchymal banding, traction bronchiectasis or frank honeycombing  to suggest superimposed interstitial lung disease at this time. No  acute consolidative airspace disease. No pleural effusions. No  definite suspicious appearing pulmonary nodules or masses are noted.  Upper Abdomen: Atherosclerosis. Low-attenuation in the hepatic  parenchyma, compatible with hepatic steatosis.  Musculoskeletal: Healing nondisplaced fractures of the anterior  aspects of the left sixth, seventh and eighth ribs. There are no  aggressive appearing lytic or blastic lesions noted in the  visualized portions of the skeleton.  IMPRESSION  1. No evidence of underlying interstitial lung disease.  2. Mild diffuse bronchial wall thickening with severe centrilobular  and paraseptal emphysema; imaging findings suggestive of advanced  COPD.  3. Atherosclerosis, including left main and 3 vessel coronary artery  disease. Assessment for potential risk factor modification, dietary  therapy or pharmacologic therapy may be warranted, if clinically  indicated.  4. Hepatic steatosis      Assessment & Plan:  COPD (chronic obstructive pulmonary disease) Please continue your Spiriva daily, try converting to the Respimat version 2 puffs daily Use albuterol 2 puffs as  needed.  Flu Shot up to date.  Follow with Dr Delton CoombesByrum in 6 months or sooner if you have any problems  Allergic rhinitis Continue  your loratdine daily Use fluticasone nasal spray as needed. We will plan to use it every day during the spring.   Chronic cough Appears to be well managed at this time. I do not believe we need to stop lisinopril for now

## 2015-10-05 NOTE — Patient Instructions (Signed)
Please continue your Spiriva daily, try converting to the Respimat version 2 puffs daily Use albuterol 2 puffs as needed.  Continue  your loratdine daily Use fluticasone nasal spray as needed. We will plan to use it every day during the spring.  Flu Shot up to date.  Follow with Dr Delton CoombesByrum in 6 months or sooner if you have any problems

## 2015-10-24 DIAGNOSIS — J449 Chronic obstructive pulmonary disease, unspecified: Secondary | ICD-10-CM | POA: Diagnosis not present

## 2015-10-29 DIAGNOSIS — J449 Chronic obstructive pulmonary disease, unspecified: Secondary | ICD-10-CM | POA: Diagnosis not present

## 2015-11-09 ENCOUNTER — Other Ambulatory Visit: Payer: Self-pay | Admitting: Emergency Medicine

## 2015-11-24 DIAGNOSIS — J449 Chronic obstructive pulmonary disease, unspecified: Secondary | ICD-10-CM | POA: Diagnosis not present

## 2015-11-29 DIAGNOSIS — J449 Chronic obstructive pulmonary disease, unspecified: Secondary | ICD-10-CM | POA: Diagnosis not present

## 2015-12-10 ENCOUNTER — Other Ambulatory Visit: Payer: Self-pay | Admitting: Emergency Medicine

## 2015-12-22 DIAGNOSIS — J449 Chronic obstructive pulmonary disease, unspecified: Secondary | ICD-10-CM | POA: Diagnosis not present

## 2015-12-27 DIAGNOSIS — J449 Chronic obstructive pulmonary disease, unspecified: Secondary | ICD-10-CM | POA: Diagnosis not present

## 2016-01-05 ENCOUNTER — Other Ambulatory Visit: Payer: Self-pay | Admitting: Emergency Medicine

## 2016-01-11 ENCOUNTER — Telehealth: Payer: Self-pay | Admitting: Emergency Medicine

## 2016-01-11 NOTE — Telephone Encounter (Signed)
Spoke with patient, aware of rec's per RB. Nothing further needed.

## 2016-01-11 NOTE — Telephone Encounter (Signed)
Patient states that his wife was diagnosed with "infectious PNA".   Patient wants to know what he can do to prevent from getting this from her. Dr. Delton CoombesByrum, please advise.

## 2016-01-11 NOTE — Telephone Encounter (Signed)
Good hand washing, avoid any contact if possible. No prophylactic meds are indicated. Needs to let us know if he develops sx

## 2016-01-22 DIAGNOSIS — J449 Chronic obstructive pulmonary disease, unspecified: Secondary | ICD-10-CM | POA: Diagnosis not present

## 2016-01-27 DIAGNOSIS — J449 Chronic obstructive pulmonary disease, unspecified: Secondary | ICD-10-CM | POA: Diagnosis not present

## 2016-02-21 DIAGNOSIS — J449 Chronic obstructive pulmonary disease, unspecified: Secondary | ICD-10-CM | POA: Diagnosis not present

## 2016-02-26 DIAGNOSIS — J449 Chronic obstructive pulmonary disease, unspecified: Secondary | ICD-10-CM | POA: Diagnosis not present

## 2016-03-23 DIAGNOSIS — J449 Chronic obstructive pulmonary disease, unspecified: Secondary | ICD-10-CM | POA: Diagnosis not present

## 2016-03-28 DIAGNOSIS — J449 Chronic obstructive pulmonary disease, unspecified: Secondary | ICD-10-CM | POA: Diagnosis not present

## 2016-03-29 DIAGNOSIS — M109 Gout, unspecified: Secondary | ICD-10-CM | POA: Diagnosis not present

## 2016-03-29 DIAGNOSIS — I10 Essential (primary) hypertension: Secondary | ICD-10-CM | POA: Diagnosis not present

## 2016-03-29 DIAGNOSIS — J449 Chronic obstructive pulmonary disease, unspecified: Secondary | ICD-10-CM | POA: Diagnosis not present

## 2016-03-29 DIAGNOSIS — Z0000 Encounter for general adult medical examination without abnormal findings: Secondary | ICD-10-CM | POA: Diagnosis not present

## 2016-03-29 DIAGNOSIS — E7800 Pure hypercholesterolemia, unspecified: Secondary | ICD-10-CM | POA: Diagnosis not present

## 2016-03-29 DIAGNOSIS — M179 Osteoarthritis of knee, unspecified: Secondary | ICD-10-CM | POA: Diagnosis not present

## 2016-03-29 DIAGNOSIS — N400 Enlarged prostate without lower urinary tract symptoms: Secondary | ICD-10-CM | POA: Diagnosis not present

## 2016-03-29 DIAGNOSIS — N529 Male erectile dysfunction, unspecified: Secondary | ICD-10-CM | POA: Diagnosis not present

## 2016-04-11 ENCOUNTER — Ambulatory Visit (INDEPENDENT_AMBULATORY_CARE_PROVIDER_SITE_OTHER): Payer: PPO | Admitting: Emergency Medicine

## 2016-04-11 ENCOUNTER — Encounter: Payer: Self-pay | Admitting: Emergency Medicine

## 2016-04-11 VITALS — BP 122/84 | HR 84 | Ht 71.0 in | Wt 192.0 lb

## 2016-04-11 DIAGNOSIS — J449 Chronic obstructive pulmonary disease, unspecified: Secondary | ICD-10-CM

## 2016-04-11 DIAGNOSIS — J309 Allergic rhinitis, unspecified: Secondary | ICD-10-CM | POA: Diagnosis not present

## 2016-04-11 DIAGNOSIS — R053 Chronic cough: Secondary | ICD-10-CM

## 2016-04-11 DIAGNOSIS — R05 Cough: Secondary | ICD-10-CM

## 2016-04-11 NOTE — Progress Notes (Signed)
Subjective:    Patient ID: Charles Archer, male    DOB: 1936/06/01, 80 y.o.   MRN: [id number]  HPI 80 yo former smoker (90 pk-yrs), hx of HTN, hyperlipemia. Has been [facility] for DOE. Stress testing and L heart cath done 09/23/13 and 10/22/13 > non-critical CAD, overall reassuring. His SOB has been slowly progressive over 5 yrs, now limiting. Unable to walk hills. He does still golf but clearly a change. No CP, no cough or wheeze.  He was treated with prednisone x 1 in the summer for CP, now ascribed to GERD. He does have ventolin that he uses rarely for cough.   ROV 12/16/13 -- former smoker, seen for exertional dyspnea. He underwent PFT today >> moderate AFL, no BD response, normal volumes, significantly decreased DLCO.  No new sx, still with exertional SOB. No flares. Last time exertional hypoxemia.   ROV 01/19/14 -- returns for f/u COPD, former tobacco. Last time we started Spiriva. Also checked CT chest as his hypoxemia seemed out of proportion  to his COPD. He says that he has benfited from Spiriva. His exertional tolerance is better.   ROV 06/10/14 -- follows for COPD, hypoxemia. We started O2 w exertion last visit. He believes proair is better than ventolin.  His breathing has been better, he is able to golf and exert some. Uses his O2 with any significant exertion. He has had the pneumovax, not clear whether he has had the Prevnar-13  ROV 03/24/15 -- follow-up visit for COPD and chronic hypoxic respiratory failure. He has been having congestion for about 2 weeks, some cough minimally productive of clear. He is on lisinopril, has always tolerated it.  He has been taking loratadine qd, has chlorpheniramine on his list but has not been using. Occasional GERD sx.   ROV 10/05/15 -- follow up for COPD, chronic hypoxemic respiratory failure. Allergic rhinitis , chronic cough. At his last visit we added fluticasone nasal spray to loratadine. Continued Spiriva.  He benefited from the  flonase, still uses prn. He feels that his breathing has been fairly stable, may have been worse this Fall. He has used albuterol before - helps him. Uses it about 2x a week. Remains on lisinopril.   ROV 05/11/16 -- 80 year old gentleman who follows up today for his COPD and chronic hypoxemic respiratory failure. He also has chronic cough in the setting of this and allergic rhinitis. He was taken off lisinopril about a month ago and it seems to have helped some. Uses his O2 reliably, uses it with playing golf. He has run out of flonase, uses it prn.  He has not had any flares since last visit.    Review of Systems  Constitutional: Negative for fever and unexpected weight change.  HENT: Positive for congestion. Negative for dental problem, ear pain, nosebleeds, postnasal drip, rhinorrhea, sinus pressure, sneezing, sore throat and trouble swallowing.   Eyes: Negative for redness and itching.  Respiratory: Positive for cough. Negative for chest tightness, shortness of breath and wheezing.   Cardiovascular: Negative for palpitations and leg swelling.  Gastrointestinal: Negative for nausea, vomiting and abdominal distention.  Genitourinary: Negative for dysuria.  Musculoskeletal: Negative for joint swelling.  Skin: Negative for rash.  Neurological: Negative for headaches.  Hematological: Does not bruise/bleed easily.  Psychiatric/Behavioral: Negative for dysphoric mood. The patient is not nervous/anxious.        Objective:   Physical Exam Filed Vitals:   04/11/16 1415 04/11/16 1416  BP:  122/84  Pulse:  84  Height: 5\' 11"  (1.803 m)   Weight: 192 lb (87.091 kg)   SpO2:  93%   Gen: Pleasant, well-nourished, in no distress,  normal affect  ENT: No lesions,  mouth clear,  oropharynx clear, no postnasal drip  Neck: No JVD, no TMG, no carotid bruits  Lungs: No use of accessory muscles, clear without rales or rhonchi  Cardiovascular: RRR, heart sounds normal, no murmur or gallops, no  peripheral edema  Musculoskeletal: No deformities, no cyanosis or clubbing  Neuro: alert, non focal  Skin: Warm, no lesions or rashes   12/24/13 --  COMPARISON  No priors.  FINDINGS  Mediastinum: Heart size is normal. There is no significant  pericardial fluid, thickening or pericardial calcification. There is  atherosclerosis of the thoracic aorta, the great vessels of the  mediastinum and the coronary arteries, including calcified  atherosclerotic plaque in the left main, left anterior descending,  left circumflex and right coronary arteries. No pathologically  enlarged mediastinal or hilar lymph nodes. Please note that accurate  exclusion of hilar adenopathy is limited on noncontrast CT scans.  Esophagus is unremarkable in appearance.  Lungs/Pleura: Mild diffuse bronchial wall thickening. Severe  centrilobular and paraseptal emphysema with advanced bullous changes  throughout the upper lobes of the lungs bilaterally, as well as the  lateral aspect of the right lower lobe. High-resolution images  demonstrate no definite regions of subpleural reticulation,  parenchymal banding, traction bronchiectasis or frank honeycombing  to suggest superimposed interstitial lung disease at this time. No  acute consolidative airspace disease. No pleural effusions. No  definite suspicious appearing pulmonary nodules or masses are noted.  Upper Abdomen: Atherosclerosis. Low-attenuation in the hepatic  parenchyma, compatible with hepatic steatosis.  Musculoskeletal: Healing nondisplaced fractures of the anterior  aspects of the left sixth, seventh and eighth ribs. There are no  aggressive appearing lytic or blastic lesions noted in the  visualized portions of the skeleton.  IMPRESSION  1. No evidence of underlying interstitial lung disease.  2. Mild diffuse bronchial wall thickening with severe centrilobular  and paraseptal emphysema; imaging findings suggestive of advanced  COPD.  3.  Atherosclerosis, including left main and 3 vessel coronary artery  disease. Assessment for potential risk factor modification, dietary  therapy or pharmacologic therapy may be warranted, if clinically  indicated.  4. Hepatic steatosis      Assessment & Plan:  COPD (chronic obstructive pulmonary disease)  Please continue Spiriva and albuterol as you are taking them We will restart Flonase 2 sprays each nostril once a day Where your oxygen 2 L/m with exertion Follow with Dr Delton CoombesByrum in 6 months or sooner if you have any problems Remember to get the flu shot this fall  Allergic rhinitis flonase qd  Chronic cough Appears to be somewhat better after switching his lisinopril to an ARB. Continue rhinitis controlled   Levy Pupaobert Dayzee Trower, MD, PhD 04/11/2016, 2:51 PM [facility] [phone] or if no answer [phone]

## 2016-04-11 NOTE — Assessment & Plan Note (Signed)
Appears to be somewhat better after switching his lisinopril to an ARB. Continue rhinitis controlled

## 2016-04-11 NOTE — Assessment & Plan Note (Signed)
flonase qd

## 2016-04-11 NOTE — Patient Instructions (Addendum)
Please continue Spiriva and albuterol as you are taking them We will restart Flonase 2 sprays each nostril once a day Where your oxygen 2 L/m with exertion Follow with Dr Delton CoombesByrum in 6 months or sooner if you have any problems Remember to get the flu shot this fall

## 2016-04-11 NOTE — Assessment & Plan Note (Signed)
Please continue Spiriva and albuterol as you are taking them We will restart Flonase 2 sprays each nostril once a day Where your oxygen 2 L/m with exertion Follow with Dr Jevante Hollibaugh in 6 months or sooner if you have any problems Remember to get the flu shot this fall 

## 2016-04-12 ENCOUNTER — Other Ambulatory Visit: Payer: Self-pay | Admitting: Emergency Medicine

## 2016-04-22 DIAGNOSIS — J449 Chronic obstructive pulmonary disease, unspecified: Secondary | ICD-10-CM | POA: Diagnosis not present

## 2016-04-27 DIAGNOSIS — J449 Chronic obstructive pulmonary disease, unspecified: Secondary | ICD-10-CM | POA: Diagnosis not present

## 2016-05-23 DIAGNOSIS — J449 Chronic obstructive pulmonary disease, unspecified: Secondary | ICD-10-CM | POA: Diagnosis not present

## 2016-05-28 DIAGNOSIS — J449 Chronic obstructive pulmonary disease, unspecified: Secondary | ICD-10-CM | POA: Diagnosis not present

## 2016-06-23 DIAGNOSIS — J449 Chronic obstructive pulmonary disease, unspecified: Secondary | ICD-10-CM | POA: Diagnosis not present

## 2016-06-28 DIAGNOSIS — J449 Chronic obstructive pulmonary disease, unspecified: Secondary | ICD-10-CM | POA: Diagnosis not present

## 2016-07-15 ENCOUNTER — Other Ambulatory Visit: Payer: Self-pay | Admitting: Emergency Medicine

## 2016-07-23 DIAGNOSIS — J449 Chronic obstructive pulmonary disease, unspecified: Secondary | ICD-10-CM | POA: Diagnosis not present

## 2016-07-28 DIAGNOSIS — J449 Chronic obstructive pulmonary disease, unspecified: Secondary | ICD-10-CM | POA: Diagnosis not present

## 2016-08-08 DIAGNOSIS — Z23 Encounter for immunization: Secondary | ICD-10-CM | POA: Diagnosis not present

## 2016-08-16 DIAGNOSIS — H2513 Age-related nuclear cataract, bilateral: Secondary | ICD-10-CM | POA: Diagnosis not present

## 2016-08-16 DIAGNOSIS — Z0100 Encounter for examination of eyes and vision without abnormal findings: Secondary | ICD-10-CM | POA: Diagnosis not present

## 2016-08-16 DIAGNOSIS — H25043 Posterior subcapsular polar age-related cataract, bilateral: Secondary | ICD-10-CM | POA: Diagnosis not present

## 2016-08-21 ENCOUNTER — Encounter: Payer: Self-pay | Admitting: Emergency Medicine

## 2016-08-21 ENCOUNTER — Ambulatory Visit (INDEPENDENT_AMBULATORY_CARE_PROVIDER_SITE_OTHER): Payer: PPO | Admitting: Emergency Medicine

## 2016-08-21 DIAGNOSIS — J301 Allergic rhinitis due to pollen: Secondary | ICD-10-CM

## 2016-08-21 DIAGNOSIS — J449 Chronic obstructive pulmonary disease, unspecified: Secondary | ICD-10-CM | POA: Diagnosis not present

## 2016-08-21 MED ORDER — UMECLIDINIUM-VILANTEROL 62.5-25 MCG/INH IN AEPB: 1.0000 | INHALATION_SPRAY | Freq: Every day | RESPIRATORY_TRACT | 5 refills | Status: DC

## 2016-08-21 NOTE — Assessment & Plan Note (Signed)
We will temporarily stop Spiriva, start Anoro once daily for the next month. Continue albuterol 2 puffs as needed Continue oxygen at 2 L/m with exertion He will call us to let us know he feels that he's benefited from the change to Anoro. If so we will order this through his pharmacy and continue  Flu shot up to date Follow with Dr Jalan Fariss in 6 months or sooner if you have any problems  

## 2016-08-21 NOTE — Progress Notes (Signed)
Subjective:    Patient ID: Charles Archer, male    DOB: [birth year], 80 y.o.   MRN: [id number]  HPI 80 yo former smoker (90 pk-yrs), hx of HTN, hyperlipemia. [facility] for DOE. Stress testing and L heart cath done 09/23/13 and 10/22/13 > non-critical CAD, overall reassuring. His SOB has been slowly progressive over 5 yrs, now limiting. Unable to walk hills. He does still golf but clearly a change. No CP, no cough or wheeze.  He was treated with prednisone x 1 in the summer for CP, now ascribed to GERD. He does have ventolin that he uses rarely for cough.   ROV 12/16/13 -- former smoker, seen for exertional dyspnea. He underwent PFT today >> moderate AFL, no BD response, normal volumes, significantly decreased DLCO.  No new sx, still with exertional SOB. No flares. Last time exertional hypoxemia.   ROV 01/19/14 -- returns for f/u COPD, former tobacco. Last time we started Spiriva. Also checked CT chest as his hypoxemia seemed out of proportion  to his COPD. He says that he has benfited from Spiriva. His exertional tolerance is better.   ROV 06/10/14 -- follows for COPD, hypoxemia. We started O2 w exertion last visit. He believes proair is better than ventolin.  His breathing has been better, he is able to golf and exert some. Uses his O2 with any significant exertion. He has had the pneumovax, not clear whether he has had the Prevnar-13  ROV 03/24/15 -- follow-up visit for COPD and chronic hypoxic respiratory failure. He has been having congestion for about 2 weeks, some cough minimally productive of clear. He is on lisinopril, has always tolerated it.  He has been taking loratadine qd, has chlorpheniramine on his list but has not been using. Occasional GERD sx.   ROV 10/05/15 -- follow up for COPD, chronic hypoxemic respiratory failure. Allergic rhinitis , chronic cough. At his last visit we added fluticasone nasal spray to loratadine. Continued Spiriva.  He benefited from the  flonase, still uses prn. He feels that his breathing has been fairly stable, may have been worse this Fall. He has used albuterol before - helps him. Uses it about 2x a week. Remains on lisinopril.   ROV 05/11/16 -- 80 year old gentleman who follows up today for his COPD and chronic hypoxemic respiratory failure. He also has chronic cough in the setting of this and allergic rhinitis. He was taken off lisinopril about a month ago and it seems to have helped some. Uses his O2 reliably, uses it with playing golf. He has run out of flonase, uses it prn.  He has not had any flares since last visit.  ROV 08/21/16 -- this is a follow-up visit for COPD and associated chronic hypoxemic respiratory failure. He also has a history of allergic rhinitis and chronic cough. He has been managed on Spiriva once a day, albuterol as needed. He uses this approximately about 2x a day. He uses oxygen at 2 L/m with exertion. He states that he is less able to walk without wearing his O2. Notices that he desaturates and is dyspneic when he doesn't wear O2.  He is is not requiring flonase frequently.  No flares since last time. Flu shot is up-to-date   Review of Systems  Constitutional: Negative for fever and unexpected weight change.  HENT: Positive for congestion. Negative for dental problem, ear pain, nosebleeds, postnasal drip, rhinorrhea, sinus pressure, sneezing, sore throat and trouble swallowing.   Eyes: Negative for  redness and itching.  Respiratory: Positive for cough. Negative for chest tightness, shortness of breath and wheezing.   Cardiovascular: Negative for palpitations and leg swelling.  Gastrointestinal: Negative for abdominal distention, nausea and vomiting.  Genitourinary: Negative for dysuria.  Musculoskeletal: Negative for joint swelling.  Skin: Negative for rash.  Neurological: Negative for headaches.  Hematological: Does not bruise/bleed easily.  Psychiatric/Behavioral: Negative for dysphoric mood. The  patient is not nervous/anxious.        Objective:   Physical Exam Vitals:   08/21/16 0904  BP: 130/80  BP Location: Left Arm  Cuff Size: Normal  Pulse: (!) 104  SpO2: 91%  Weight: 200 lb (90.7 kg)  Height: 5\' 11"  (1.803 m)   Gen: Pleasant, well-nourished, in no distress,  normal affect  ENT: No lesions,  mouth clear,  oropharynx clear, no postnasal drip  Neck: No JVD, no TMG, no carotid bruits  Lungs: No use of accessory muscles, clear without rales or rhonchi  Cardiovascular: RRR, heart sounds normal, no murmur or gallops, no peripheral edema  Musculoskeletal: No deformities, no cyanosis or clubbing  Neuro: alert, non focal  Skin: Warm, no lesions or rashes   12/24/13 --  COMPARISON  No priors.  FINDINGS  Mediastinum: Heart size is normal. There is no significant  pericardial fluid, thickening or pericardial calcification. There is  atherosclerosis of the thoracic aorta, the great vessels of the  mediastinum and the coronary arteries, including calcified  atherosclerotic plaque in the left main, left anterior descending,  left circumflex and right coronary arteries. No pathologically  enlarged mediastinal or hilar lymph nodes. Please note that accurate  exclusion of hilar adenopathy is limited on noncontrast CT scans.  Esophagus is unremarkable in appearance.  Lungs/Pleura: Mild diffuse bronchial wall thickening. Severe  centrilobular and paraseptal emphysema with advanced bullous changes  throughout the upper lobes of the lungs bilaterally, as well as the  lateral aspect of the right lower lobe. High-resolution images  demonstrate no definite regions of subpleural reticulation,  parenchymal banding, traction bronchiectasis or frank honeycombing  to suggest superimposed interstitial lung disease at this time. No  acute consolidative airspace disease. No pleural effusions. No  definite suspicious appearing pulmonary nodules or masses are noted.  Upper Abdomen:  Atherosclerosis. Low-attenuation in the hepatic  parenchyma, compatible with hepatic steatosis.  Musculoskeletal: Healing nondisplaced fractures of the anterior  aspects of the left sixth, seventh and eighth ribs. There are no  aggressive appearing lytic or blastic lesions noted in the  visualized portions of the skeleton.  IMPRESSION  1. No evidence of underlying interstitial lung disease.  2. Mild diffuse bronchial wall thickening with severe centrilobular  and paraseptal emphysema; imaging findings suggestive of advanced  COPD.  3. Atherosclerosis, including left main and 3 vessel coronary artery  disease. Assessment for potential risk factor modification, dietary  therapy or pharmacologic therapy may be warranted, if clinically  indicated.  4. Hepatic steatosis      Assessment & Plan:  COPD (chronic obstructive pulmonary disease)  We will temporarily stop Spiriva, start Anoro once daily for the next month. Continue albuterol 2 puffs as needed Continue oxygen at 2 L/m with exertion He will call us to let us know he feels that he's benefited from the change to Anoro. If so we will order this through his pharmacy and continue  Flu shot up to date Follow with Dr Delton Coombes in 6 months or sooner if you have any problems  Allergic rhinitis He has been able to  change flonase to prn.   Levy Pupaobert Malone Vanblarcom, MD, PhD 08/21/2016, 9:21 AM [facility] [phone] or if no answer [phone]

## 2016-08-21 NOTE — Patient Instructions (Addendum)
We will temporarily stop Spiriva, start Anoro once daily for the next month. Continue albuterol 2 puffs as needed Continue oxygen at 2 L/m with exertion He will call us to let us know he feels that he's benefited from the change to Anoro. If so we will order this through his pharmacy and continue  Flu shot up to date Follow with Dr Delton CoombesByrum in 6 months or sooner if you have any problems

## 2016-08-21 NOTE — Addendum Note (Signed)
Addended by: Jaynee EaglesLEMONS, LINDSAY C on: 08/21/2016 09:24 AM   Modules accepted: Orders

## 2016-08-21 NOTE — Assessment & Plan Note (Signed)
He has been able to change flonase to prn.

## 2016-08-22 ENCOUNTER — Other Ambulatory Visit: Payer: Self-pay | Admitting: *Deleted

## 2016-08-23 DIAGNOSIS — J449 Chronic obstructive pulmonary disease, unspecified: Secondary | ICD-10-CM | POA: Diagnosis not present

## 2016-08-28 DIAGNOSIS — J449 Chronic obstructive pulmonary disease, unspecified: Secondary | ICD-10-CM | POA: Diagnosis not present

## 2016-09-13 ENCOUNTER — Telehealth: Payer: Self-pay | Admitting: Emergency Medicine

## 2016-09-13 NOTE — Telephone Encounter (Signed)
Please check to see if Stiolto is covered.

## 2016-09-13 NOTE — Telephone Encounter (Signed)
Spoke with pt, states that the anoro is working much better for him than the spiriva, but it is not covered by insurance.  He has a free month coupon that will cover him through the end of the year, but wants to try something else that will be covered by insurance.    RB please advise.  Thanks!

## 2016-09-14 NOTE — Telephone Encounter (Signed)
Sp[facility] with Toni [facility] who states stiolto is a covered medication, however it is a $132.70 co pay. I have advise pt to contact his insurance company for a formulary. Pt states he will call us back with this formulary and covered alternatives for anoro.  Nothing further needed at this time.

## 2016-09-15 ENCOUNTER — Telehealth: Payer: Self-pay | Admitting: Emergency Medicine

## 2016-09-15 NOTE — Telephone Encounter (Signed)
Pt called back and he stated that his insurance will cover the advair and atrovent.  RB please advise of which medication you would like to send in .  Thanks   No Known Allergies

## 2016-09-19 NOTE — Telephone Encounter (Signed)
RB please advise. Thanks.  

## 2016-09-21 NOTE — Telephone Encounter (Signed)
RB please advise. thanks 

## 2016-09-22 DIAGNOSIS — J449 Chronic obstructive pulmonary disease, unspecified: Secondary | ICD-10-CM | POA: Diagnosis not present

## 2016-09-25 MED ORDER — IPRATROPIUM BROMIDE HFA 17 MCG/ACT IN AERS: 2.0000 | INHALATION_SPRAY | Freq: Four times a day (QID) | RESPIRATORY_TRACT | 5 refills | Status: DC

## 2016-09-25 MED ORDER — FLUTICASONE-SALMETEROL 250-50 MCG/DOSE IN AEPB: 1.0000 | INHALATION_SPRAY | Freq: Two times a day (BID) | RESPIRATORY_TRACT | 5 refills | Status: DC

## 2016-09-25 NOTE — Telephone Encounter (Signed)
Pt would like to proceed with advair and atrovent. RX has been sent to preferred pharmacy. Pt aware & voiced understanding. Nothing further needed.

## 2016-09-25 NOTE — Telephone Encounter (Signed)
Please let him know that Stiolto may be a better choice here because it is longer acting.  If he would still like to try to change then instruct him to start Advair 250/50 twice a day, atrovent 2 puffs 4 times a day.

## 2016-09-27 DIAGNOSIS — J449 Chronic obstructive pulmonary disease, unspecified: Secondary | ICD-10-CM | POA: Diagnosis not present

## 2016-10-12 ENCOUNTER — Other Ambulatory Visit: Payer: Self-pay | Admitting: *Deleted

## 2016-10-12 MED ORDER — ALBUTEROL SULFATE HFA 108 (90 BASE) MCG/ACT IN AERS: 2.0000 | INHALATION_SPRAY | Freq: Four times a day (QID) | RESPIRATORY_TRACT | 5 refills | Status: DC | PRN

## 2016-10-23 DIAGNOSIS — J449 Chronic obstructive pulmonary disease, unspecified: Secondary | ICD-10-CM | POA: Diagnosis not present

## 2016-10-28 DIAGNOSIS — J449 Chronic obstructive pulmonary disease, unspecified: Secondary | ICD-10-CM | POA: Diagnosis not present

## 2016-11-23 DIAGNOSIS — J449 Chronic obstructive pulmonary disease, unspecified: Secondary | ICD-10-CM | POA: Diagnosis not present

## 2016-11-28 DIAGNOSIS — J449 Chronic obstructive pulmonary disease, unspecified: Secondary | ICD-10-CM | POA: Diagnosis not present

## 2016-12-21 DIAGNOSIS — J449 Chronic obstructive pulmonary disease, unspecified: Secondary | ICD-10-CM | POA: Diagnosis not present

## 2016-12-26 DIAGNOSIS — J449 Chronic obstructive pulmonary disease, unspecified: Secondary | ICD-10-CM | POA: Diagnosis not present

## 2017-01-08 ENCOUNTER — Telehealth: Payer: Self-pay | Admitting: Emergency Medicine

## 2017-01-08 DIAGNOSIS — R05 Cough: Secondary | ICD-10-CM | POA: Diagnosis not present

## 2017-01-08 DIAGNOSIS — J449 Chronic obstructive pulmonary disease, unspecified: Secondary | ICD-10-CM | POA: Diagnosis not present

## 2017-01-08 DIAGNOSIS — R918 Other nonspecific abnormal finding of lung field: Secondary | ICD-10-CM

## 2017-01-08 NOTE — Telephone Encounter (Signed)
Called Dr Tenny Crawoss Office, they are currently closed and we will have to call back in the AM 01/09/17

## 2017-01-09 NOTE — Telephone Encounter (Signed)
lmtcb X1 for April at Dr. Tenny Crawoss' office.

## 2017-01-11 ENCOUNTER — Telehealth: Payer: Self-pay | Admitting: Emergency Medicine

## 2017-01-11 NOTE — Telephone Encounter (Signed)
lmomtcb x 2 for April.

## 2017-01-11 NOTE — Telephone Encounter (Signed)
RB please advise if okay to double book, as there is no availability. Pt is scheduled for CT on 01/17/17, and was instructed to f/u with RB after CT within the next week. lmtcb to make pt aware that we are awaiting response from RB.  RB please advise. Thanks.

## 2017-01-11 NOTE — Telephone Encounter (Signed)
Called spoke with patient, discussed RB's recommendations below Pt okay with proceeding with Ct Chest and is aware once he receives a date for this, he will need appt with RB or NP/APP to discuss results.  Order placed Nothing further needed; will sign off

## 2017-01-11 NOTE — Telephone Encounter (Signed)
Charles Archer with Dr Daisy Florohar[facility] called back Dr Tenny Crawoss saw patient on 3.26.18 for acute visit > started him on albuterol neb, gave pt depo 80, Levaquin 500mg  x10days Cxr was abnormal with questionable fuild vs left lung mass  Patient was last seen by RB 08/2016 and recommended to follow up here in 6 months Dr Tenny Crawoss had referred back to RB to be seen in 2 weeks for follow up but with the cxr results, would like to know if RB feels patient should be seen sooner  Charles Archer reported that the office note and cxr report have been faxed to this office but have not been able to locate records Charles Archer also provided the call report they received from the radiologist: IMPRESSION: LLL opacity questionable loculated effusion or scarring.  Infiltrate in this area however cannot be excluded given the change from prior studies and concerns for potential PNA.  CT Chest non contrast recommended   Dr Delton CoombesByrum please advise on recommended follow up for patient.  Thank you.  **Charles Archer does not need call back unless there are additional questions.

## 2017-01-11 NOTE — Telephone Encounter (Signed)
Please order a Ct scan of the chest without contrast, dx is pulmonary infiltrate. Then have him seen by APP after to review if I am not available in office (I probably am not)

## 2017-01-15 NOTE — Telephone Encounter (Signed)
Called spoke with patient Appt scheduled with TP for 4.6.18 @ 1115 to discuss CT results Nothing further needed; will sign off

## 2017-01-17 ENCOUNTER — Ambulatory Visit (INDEPENDENT_AMBULATORY_CARE_PROVIDER_SITE_OTHER)
Admission: RE | Admit: 2017-01-17 | Discharge: 2017-01-17 | Disposition: A | Discharge status: Home / Self Care | Payer: PPO | Source: Ambulatory Visit | Attending: Emergency Medicine | Admitting: Emergency Medicine

## 2017-01-17 DIAGNOSIS — J439 Emphysema, unspecified: Secondary | ICD-10-CM | POA: Diagnosis not present

## 2017-01-17 DIAGNOSIS — R918 Other nonspecific abnormal finding of lung field: Secondary | ICD-10-CM

## 2017-01-17 DIAGNOSIS — R0602 Shortness of breath: Secondary | ICD-10-CM | POA: Diagnosis not present

## 2017-01-19 ENCOUNTER — Ambulatory Visit (INDEPENDENT_AMBULATORY_CARE_PROVIDER_SITE_OTHER): Payer: PPO | Admitting: Adult Health

## 2017-01-19 ENCOUNTER — Encounter: Payer: Self-pay | Admitting: Adult Health

## 2017-01-19 DIAGNOSIS — R911 Solitary pulmonary nodule: Secondary | ICD-10-CM | POA: Diagnosis not present

## 2017-01-19 DIAGNOSIS — J449 Chronic obstructive pulmonary disease, unspecified: Secondary | ICD-10-CM

## 2017-01-19 NOTE — Progress Notes (Signed)
  ID: Charles Archer, male    DOB: 03-Jan-1936, 81 y.o.   MRN: [id number]  Chief Complaint  Patient presents with  . Follow-up    COPD     Referring provider: Daisy Floro, MD  HPI: 81 yo male former smoker followed for COPD and O2 RF .   TEST  CT chest 4/418 >subpleural density along the anterior aspect of the left major fissure.     01/24/2017 Acute OV  Pt presents for an acute office visit . Complains of 2 weeks of cough , congestion .  Seen by PCP 2 weeks ago , with bronchitis , tx w/ abx and steroids . CXR showed abnormal area on left lung .  Starting to feel better with cough and congestion .  CT chest was set up on 01/17/17 that showed a subpleural density along the anterior aspect of left major fissure. Severe emphysema , BB honeycombing .  Remains on Adviar . Uses albuterol neb As needed  .  Wife has been sick too.  No fever, hemoptyiss , or wt loss.    No Known Allergies  Immunization History  Administered Date(s) Administered  . Influenza Split 07/16/2013, 08/05/2015  . Influenza,inj,Quad PF,36+ Mos 07/17/2014, 08/14/2016  . Pneumococcal Conjugate-13 06/10/2014    Past Medical History:  Diagnosis Date  . Abnormal stress test    s/p cardiac cath 10/2013 - nonobstructive CAD with normal LV function  . CKD (chronic kidney disease)   . COPD (chronic obstructive pulmonary disease) (HCC)   . Dyspnea on exertion   . Emphysema lung (HCC)   . GERD (gastroesophageal reflux disease)   . Gout   . HTN (hypertension)   . Hyperlipidemia     Tobacco History: History  Smoking Status  . Former Smoker  . Packs/day: 3.00  . Years: 30.00  . Types: Cigarettes  . Quit date: 09/03/1989  Smokeless Tobacco  . Never Used   Counseling given: Not Answered   Outpatient Encounter Prescriptions as of 01/19/2017  Medication Sig  . acetaminophen (TYLENOL ARTHRITIS PAIN) 650 MG CR tablet Take 650 mg by mouth every 8 (eight) hours as needed for pain.  Marland Kitchen albuterol  (PROAIR HFA) 108 (90 Base) MCG/ACT inhaler Inhale 2 puffs into the lungs every 6 (six) hours as needed for wheezing or shortness of breath.  Marland Kitchen albuterol (PROVENTIL) (2.5 MG/3ML) 0.083% nebulizer solution Take 2.5 mg by nebulization every 6 (six) hours as needed for wheezing or shortness of breath.  . allopurinol (ZYLOPRIM) 100 MG tablet Take 100 mg by mouth daily.  Marland Kitchen aspirin 81 MG tablet Take 81 mg by mouth daily.  . B Complex-Folic Acid (SUPER B COMPLEX MAXI PO) Take 1 tablet by mouth daily.   . calcium carbonate (OS-CAL) 600 MG TABS tablet Take 600 mg by mouth daily.  . Cinnamon 500 MG capsule Take 1,000 mg by mouth daily.  Marland Kitchen docusate sodium (COLACE) 50 MG capsule Take 50 mg by mouth daily as needed for mild constipation.   . fluticasone (FLONASE) 50 MCG/ACT nasal spray PLACE 2 SPRAYS INTO BOTH NOSTRILS DAILY.  Marland Kitchen Fluticasone-Salmeterol (ADVAIR DISKUS) 250-50 MCG/DOSE AEPB Inhale 1 puff into the lungs 2 (two) times daily.  . Ginkgo Biloba 40 MG TABS Take 40 mg by mouth daily.   Marland Kitchen ipratropium (ATROVENT HFA) 17 MCG/ACT inhaler Inhale 2 puffs into the lungs every 6 (six) hours.  . irbesartan (AVAPRO) 300 MG tablet Take 1 tablet by mouth daily.  . Misc Natural Products (OSTEO BI-FLEX  ADV JOINT SHIELD PO) Take 1,500 mg by mouth daily.  . Multiple Vitamin (MULTIVITAMIN) capsule Take 1 capsule by mouth daily.  . Naproxen Sodium (ALEVE PO) Take 220 mg by mouth as needed (for pain).   . niacin 250 MG tablet Take 250 mg by mouth at bedtime.  . sildenafil (REVATIO) 20 MG tablet Take 1 tablet by mouth daily.  . tamsulosin (FLOMAX) 0.4 MG CAPS capsule Take 1 capsule by mouth daily.  . vitamin B-12 (CYANOCOBALAMIN) 1000 MCG tablet Take 1,000 mcg by mouth daily.  Marland Kitchen zinc gluconate 50 MG tablet Take 50 mg by mouth daily.  . fluorouracil (EFUDEX) 5 % cream Apply 5 % topically 2 (two) times daily as needed.   Marland Kitchen SPIRIVA HANDIHALER 18 MCG inhalation capsule PLACE 1 CAPSULE (18 MCG TOTAL) INTO INHALER AND INHALE  DAILY. (Patient not taking: Reported on 01/19/2017)  . [DISCONTINUED] umeclidinium-vilanterol (ANORO ELLIPTA) 62.5-25 MCG/INH AEPB Inhale 1 puff into the lungs daily. (Patient not taking: Reported on 01/19/2017)   No facility-administered encounter medications on file as of 01/19/2017.      Review of Systems  Constitutional:   No  weight loss, night sweats,  Fevers, chills, fatigue, or  lassitude.  HEENT:   No headaches,  Difficulty swallowing,  Tooth/dental problems, or  Sore throat,                No sneezing, itching, ear ache,  +nasal congestion, post nasal drip,   CV:  No chest pain,  Orthopnea, PND, swelling in lower extremities, anasarca, dizziness, palpitations, syncope.   GI  No heartburn, indigestion, abdominal pain, nausea, vomiting, diarrhea, change in bowel habits, loss of appetite, bloody stools.   Resp:  No chest wall deformity  Skin: no rash or lesions.  GU: no dysuria, change in color of urine, no urgency or frequency.  No flank pain, no hematuria   MS:  No joint pain or swelling.  No decreased range of motion.  No back pain.    Physical Exam  BP 106/66 (BP Location: Left Arm, Cuff Size: Normal)   Pulse 99   SpO2 95%   GEN: A/Ox3; pleasant , NAD , elderly    HEENT:  [place]/AT,  EACs-clear, TMs-wnl, NOSE-clear, THROAT-clear, no lesions, no postnasal drip or exudate noted.   NECK:  Supple w/ fair ROM; no JVD; normal carotid impulses w/o bruits; no thyromegaly or nodules palpated; no lymphadenopathy.    RESP  Decreased BS in bases ,  no accessory muscle use, no dullness to percussion  CARD:  RRR, no m/r/g, no peripheral edema, pulses intact, no cyanosis or clubbing.  GI:   Soft & nt; nml bowel sounds; no organomegaly or masses detected.   Musco: Warm bil, no deformities or joint swelling noted.   Neuro: alert, no focal deficits noted.    Skin: Warm, no lesions or rashes    Lab Results:  CBC    Component Value Date/Time   WBC 6.8 10/20/2013 1632   RBC  4.85 10/20/2013 1632   HGB 15.8 10/20/2013 1632   HCT 46.3 10/20/2013 1632   PLT 189.0 10/20/2013 1632   MCV 95.4 10/20/2013 1632   MCHC 34.1 10/20/2013 1632   RDW 14.8 (H) 10/20/2013 1632   LYMPHSABS 2.6 10/20/2013 1632   MONOABS 0.4 10/20/2013 1632   EOSABS 0.3 10/20/2013 1632   BASOSABS 0.0 10/20/2013 1632    BMET    Component Value Date/Time   NA 142 10/20/2013 1632   K 4.3 10/20/2013 1632  CL 109 10/20/2013 1632   CO2 27 10/20/2013 1632   GLUCOSE 104 (H) 10/20/2013 1632   BUN 16 10/20/2013 1632   CREATININE 1.5 10/20/2013 1632   CALCIUM 9.2 10/20/2013 1632    BNP No results found for: BNP  ProBNP No results found for: PROBNP  Imaging: Ct Chest Wo Contrast  Result Date: 01/17/2017 CLINICAL DATA:  Cough with shortness of breath. Diagnosed with pneumonia last week, feeling better after antibiotic therapy. Oxygen dependent from chronic obstructive pulmonary disease. Former smoker. No history of malignancy. EXAM: CT CHEST WITHOUT CONTRAST TECHNIQUE: Multidetector CT imaging of the chest was performed following the standard protocol without IV contrast. COMPARISON:  Radiographs 01/08/2017.  CT 12/24/2013. FINDINGS: Cardiovascular: No acute vascular findings on noncontrast imaging. There is atherosclerosis of the aorta, great vessels and coronary arteries. The heart size is normal. There is no pericardial effusion. Mediastinum/Nodes: There are no enlarged mediastinal, hilar or axillary lymph nodes.Hilar assessment is limited by the lack of intravenous contrast, although the hilar contours appear unchanged. The thyroid gland, trachea and esophagus demonstrate no significant findings. Lungs/Pleura: There is no pleural effusion. Again demonstrated is severe, upper lobe predominant emphysema. No acute findings are seen within the right lung. There is some subpleural scarring along the fissures. There is mildly increased subpleural scarring posteriorly at the left apex, best seen on the  reformatted images. This appears non mass-like. Corresponding with the recent radiographic density is a new wedge-shaped subpleural density along the anterior aspect of the left major fissure. Previously, there was a large bulla or possible loculated pneumothorax in this area. The soft tissue components of this currently measure up to 4.4 x 2.2 cm on image 102. There is some developing honeycomb formation at both lung bases, greater on the left. Upper abdomen: The liver demonstrates mild contour irregularity and parenchymal low-density, similar to the previous examination. There is a probable exophytic cyst involving the upper pole the left kidney, incompletely visualized. No adrenal mass. Musculoskeletal/Chest wall: There is no chest wall mass or suspicious osseous finding. IMPRESSION: 1. The recent radiographic density corresponds with a subpleural density along the anterior aspect of the left major fissure. This is wedge-shaped and may reflect an area of resolving inflammation, pulmonary infarct or rounded atelectasis. Neoplasm is felt to be unlikely, but requires follow-up to exclude. Radiographic or CT follow-up in 6 months suggested. 2. Severe emphysema with developing honeycomb formation at both lung bases consistent with superimposed UIP. 3. No adenopathy, pleural effusion or pneumothorax. 4. Diffuse atherosclerosis. Electronically Signed   By: Carey Bullocks M.D.   On: 01/17/2017 15:26     Assessment & Plan:   COPD (chronic obstructive pulmonary disease) Exacerbation -now resolving with abx and steroids  Plan  Patient Instructions  I will discuss case with Dr. Delton Coombes  And call back if we need to do something different .  Continue on current regimen .  Set up for CT chest in 3 months to follow lung nodule.  follow up Dr. Delton Coombes  In 3 months and As needed      Lung nodule Left lung density - area measures up to 4.4cm , it does abut up to the pleura . Could possibly be reached by CT guided bx  however he has severe emphysema with previous bullae along this spot in past . Worry that PTX or BPF might occur with TTX . Will discuss with Dr. Delton Coombes  .  Another option would be to consider FOB and serial CT chest in 3  months .       Rubye Oaks, NP 01/24/2017

## 2017-01-19 NOTE — Patient Instructions (Addendum)
I will discuss case with Dr. Delton Coombes  And call back if we need to do something different .  Continue on current regimen .  Set up for CT chest in 3 months to follow lung nodule.  follow up Dr. Delton Coombes  In 3 months and As needed    Late add : case discussed with Dr. Delton Coombes  - will have pt return in 3 weeks with cxr

## 2017-01-19 NOTE — Assessment & Plan Note (Signed)
Exacerbation -now resolving with abx and steroids  Plan  Patient Instructions  I will discuss case with Dr. Delton Coombes  And call back if we need to do something different .  Continue on current regimen .  Set up for CT chest in 3 months to follow lung nodule.  follow up Dr. Delton Coombes  In 3 months and As needed

## 2017-01-19 NOTE — Assessment & Plan Note (Signed)
Left lung density - area measures up to 4.4cm , it does abut up to the pleura . Could possibly be reached by CT guided bx however he has severe emphysema with previous bullae along this spot in past . Worry that PTX or BPF might occur with TTX . Will discuss with Dr. Delton Coombes  .  Another option would be to consider FOB and serial CT chest in 3 months .

## 2017-01-21 DIAGNOSIS — J449 Chronic obstructive pulmonary disease, unspecified: Secondary | ICD-10-CM | POA: Diagnosis not present

## 2017-01-25 ENCOUNTER — Telehealth: Payer: Self-pay | Admitting: Adult Health

## 2017-01-25 DIAGNOSIS — R911 Solitary pulmonary nodule: Secondary | ICD-10-CM

## 2017-01-25 NOTE — Telephone Encounter (Signed)
Pt seen by TP on 4.6.18 Per her discussion with RB - pt to be seen in 3 weeks with cxr by RB.  Per TP okay to double book on 5.3.18 @ 1330, pt needs to arrive early for cxr.  Called spoke with patient's spouse [name] and appt was scheduled.  She is aware pt needs to arrive early for cxr.  Order has been placed STAT under RB.  Nothing further needed; will sign off.

## 2017-01-26 DIAGNOSIS — J449 Chronic obstructive pulmonary disease, unspecified: Secondary | ICD-10-CM | POA: Diagnosis not present

## 2017-02-15 ENCOUNTER — Encounter: Payer: Self-pay | Admitting: Emergency Medicine

## 2017-02-15 ENCOUNTER — Ambulatory Visit (INDEPENDENT_AMBULATORY_CARE_PROVIDER_SITE_OTHER)
Admission: RE | Admit: 2017-02-15 | Discharge: 2017-02-15 | Disposition: A | Discharge status: Home / Self Care | Payer: PPO | Source: Ambulatory Visit | Attending: Emergency Medicine | Admitting: Emergency Medicine

## 2017-02-15 ENCOUNTER — Ambulatory Visit (INDEPENDENT_AMBULATORY_CARE_PROVIDER_SITE_OTHER): Payer: PPO | Admitting: Emergency Medicine

## 2017-02-15 DIAGNOSIS — J189 Pneumonia, unspecified organism: Secondary | ICD-10-CM | POA: Diagnosis not present

## 2017-02-15 DIAGNOSIS — J301 Allergic rhinitis due to pollen: Secondary | ICD-10-CM | POA: Diagnosis not present

## 2017-02-15 DIAGNOSIS — J439 Emphysema, unspecified: Secondary | ICD-10-CM | POA: Diagnosis not present

## 2017-02-15 DIAGNOSIS — J449 Chronic obstructive pulmonary disease, unspecified: Secondary | ICD-10-CM | POA: Diagnosis not present

## 2017-02-15 DIAGNOSIS — J9611 Chronic respiratory failure with hypoxia: Secondary | ICD-10-CM | POA: Diagnosis not present

## 2017-02-15 DIAGNOSIS — R911 Solitary pulmonary nodule: Secondary | ICD-10-CM

## 2017-02-15 MED ORDER — IPRATROPIUM-ALBUTEROL 0.5-2.5 (3) MG/3ML IN SOLN: 3.0000 mL | Freq: Three times a day (TID) | RESPIRATORY_TRACT | 4 refills | Status: DC

## 2017-02-15 NOTE — Assessment & Plan Note (Signed)
Seems to be recovering from his acute exacerbation/pneumonia. Discussed with him adding Spiriva to his Advair. He is concerned about cost which would be $45 monthly. For now we will continue the Advair. I will change his albuterol nebulizers (which she has been using twice a day) to ipratropium plus albuterol 3 times a day.  Please continue your Advair twice a day.  We will start DuoNeb (atrovent / albuterol) three times a day.  We discussed adding Spiriva to your regimen - we can do this in the future when you decide you need it,.  Take albuterol 2 puffs up to every 4 hours if needed for shortness of breath.

## 2017-02-15 NOTE — Assessment & Plan Note (Addendum)
Continue same medications.

## 2017-02-15 NOTE — Assessment & Plan Note (Signed)
Desaturated with ambulation today. I will try increasing his exertional oxygen to 4 L/m pulsed. He can continue 3 L/m

## 2017-02-15 NOTE — Assessment & Plan Note (Signed)
Left lower lobe opacity on CT scan of the chest looks like possible rounded atelectasis. His chest x-ray today is stable. I would like to repeat his CT scan without contrast in October 2018 to look for interval change.

## 2017-02-15 NOTE — Patient Instructions (Addendum)
Please continue your Advair twice a day.  We will start DuoNeb (atrovent / albuterol) three times a day.  We discussed adding Spiriva to your regimen - we can do this in the future when you decide you need it,.  Take albuterol 2 puffs up to every 4 hours if needed for shortness of breath.  We will increase your oxygen to 4L/min pulsed when you are walking or exerting. You can stay on 3L/min when you are at rest.  We will repeat your CT scan of the chest in October 2018.  Follow with Dr Delton CoombesByrum in July as planned.

## 2017-02-15 NOTE — Progress Notes (Signed)
Subjective:    Patient ID: Charles Archer, male    DOB: 08-27-1936, 81 y.o.   MRN: [id number]  HPI 81 yo former smoker (90 pk-yrs), hx of HTN, hyperlipemia. Has been evaluated at [facility] for DOE. Stress testing and L heart cath done 09/23/13 and 10/22/13 > non-critical CAD, overall reassuring. His SOB has been slowly progressive over 5 yrs, now limiting. Unable to walk hills. He does still golf but clearly a change. No CP, no cough or wheeze.  He was treated with prednisone x 1 in the summer for CP, now ascribed to GERD. He does have ventolin that he uses rarely for cough.   PFT 12/28/13  >> moderate AFL, no BD response, normal volumes, significantly decreased DLCO.  No new sx, still with exertional SOB. No flares. Last time exertional hypoxemia.    ROV 08/21/16 -- this is a follow-up visit for COPD and associated chronic hypoxemic respiratory failure. He also has a history of allergic rhinitis and chronic cough. He has been managed on Spiriva once a day, albuterol as needed. He uses this approximately about 2x a day. He uses oxygen at 2 L/m with exertion. He states that he is less able to walk without wearing his O2. Notices that he desaturates and is dyspneic when he doesn't wear O2.  He is is not requiring flonase frequently.  No flares since last time. Flu shot is up-to-date  ROV 02/15/17 -- Patient has a history of COPD with associated chronic hypoxemic respiratory failure, allergic rhinitis, chronic cough. He was seen 01/19/17 after being treated for an acute exacerbation. He was found to have a left lung density, subsequent CT scan of the chest 01/17/17 showed a wedge-shaped area of possible rounded atelectasis. He underwent chest x-ray today that I have reviewed. This shows stable L lateral -basilar change compared with last 2 month. He is still building up his exercise tolerance. He desaturates on 3L/min pulsed. Currently on Advair, feels that he benefits from it. No longer on a LAMA.     Review of Systems  Constitutional: Negative for fever and unexpected weight change.  HENT: Positive for congestion. Negative for dental problem, ear pain, nosebleeds, postnasal drip, rhinorrhea, sinus pressure, sneezing, sore throat and trouble swallowing.   Eyes: Negative for redness and itching.  Respiratory: Positive for cough. Negative for chest tightness, shortness of breath and wheezing.   Cardiovascular: Negative for palpitations and leg swelling.  Gastrointestinal: Negative for abdominal distention, nausea and vomiting.  Genitourinary: Negative for dysuria.  Musculoskeletal: Negative for joint swelling.  Skin: Negative for rash.  Neurological: Negative for headaches.  Hematological: Does not bruise/bleed easily.  Psychiatric/Behavioral: Negative for dysphoric mood. The patient is not nervous/anxious.        Objective:   Physical Exam Vitals:   02/15/17 1327  BP: 124/74  Pulse: 96  SpO2: 94%  Weight: 182 lb (82.6 kg)  Height: 5\' 11"  (1.803 m)   Gen: Pleasant, well-nourished, in no distress,  normal affect  ENT: No lesions,  mouth clear,  oropharynx clear, no postnasal drip  Neck: No JVD, no TMG, no carotid bruits  Lungs: No use of accessory muscles, clear without rales or rhonchi  Cardiovascular: RRR, heart sounds normal, no murmur or gallops, no peripheral edema  Musculoskeletal: No deformities, no cyanosis or clubbing  Neuro: alert, non focal  Skin: Warm, no lesions or rashes   12/24/13 --  COMPARISON  No priors.  FINDINGS  Mediastinum: Heart size is normal. There is no  significant  pericardial fluid, thickening or pericardial calcification. There is  atherosclerosis of the thoracic aorta, the great vessels of the  mediastinum and the coronary arteries, including calcified  atherosclerotic plaque in the left main, left anterior descending,  left circumflex and right coronary arteries. No pathologically  enlarged mediastinal or hilar lymph nodes.  Please note that accurate  exclusion of hilar adenopathy is limited on noncontrast CT scans.  Esophagus is unremarkable in appearance.  Lungs/Pleura: Mild diffuse bronchial wall thickening. Severe  centrilobular and paraseptal emphysema with advanced bullous changes  throughout the upper lobes of the lungs bilaterally, as well as the  lateral aspect of the right lower lobe. High-resolution images  demonstrate no definite regions of subpleural reticulation,  parenchymal banding, traction bronchiectasis or frank honeycombing  to suggest superimposed interstitial lung disease at this time. No  acute consolidative airspace disease. No pleural effusions. No  definite suspicious appearing pulmonary nodules or masses are noted.  Upper Abdomen: Atherosclerosis. Low-attenuation in the hepatic  parenchyma, compatible with hepatic steatosis.  Musculoskeletal: Healing nondisplaced fractures of the anterior  aspects of the left sixth, seventh and eighth ribs. There are no  aggressive appearing lytic or blastic lesions noted in the  visualized portions of the skeleton.  IMPRESSION  1. No evidence of underlying interstitial lung disease.  2. Mild diffuse bronchial wall thickening with severe centrilobular  and paraseptal emphysema; imaging findings suggestive of advanced  COPD.  3. Atherosclerosis, including left main and 3 vessel coronary artery  disease. Assessment for potential risk factor modification, dietary  therapy or pharmacologic therapy may be warranted, if clinically  indicated.  4. Hepatic steatosis      Assessment & Plan:  COPD (chronic obstructive pulmonary disease) Seems to be recovering from his acute exacerbation/pneumonia. Discussed with him adding Spiriva to his Advair. He is concerned about cost which would be $45 monthly. For now we will continue the Advair. I will change his albuterol nebulizers (which she has been using twice a day) to ipratropium plus albuterol 3 times a  day.  Please continue your Advair twice a day.  We will start DuoNeb (atrovent / albuterol) three times a day.  We discussed adding Spiriva to your regimen - we can do this in the future when you decide you need it,.  Take albuterol 2 puffs up to every 4 hours if needed for shortness of breath.   Allergic rhinitis Continue same medications  Lung nodule Left lower lobe opacity on CT scan of the chest looks like possible rounded atelectasis. His chest x-ray today is stable. I would like to repeat his CT scan without contrast in October 2018 to look for interval change.  Hypoxemic respiratory failure, chronic (HCC) Desaturated with ambulation today. I will try increasing his exertional oxygen to 4 L/m pulsed. He can continue 3 L/m  Levy Pupaobert Kaiyon Hynes, MD, PhD 02/15/2017, 2:11 PM [facility] [phone] or if no answer [phone]

## 2017-02-20 DIAGNOSIS — J449 Chronic obstructive pulmonary disease, unspecified: Secondary | ICD-10-CM | POA: Diagnosis not present

## 2017-02-25 DIAGNOSIS — J449 Chronic obstructive pulmonary disease, unspecified: Secondary | ICD-10-CM | POA: Diagnosis not present

## 2017-03-06 ENCOUNTER — Inpatient Hospital Stay: Admission: RE | Admit: 2017-03-06 | Payer: PPO | Source: Ambulatory Visit

## 2017-03-23 DIAGNOSIS — J449 Chronic obstructive pulmonary disease, unspecified: Secondary | ICD-10-CM | POA: Diagnosis not present

## 2017-03-28 DIAGNOSIS — J449 Chronic obstructive pulmonary disease, unspecified: Secondary | ICD-10-CM | POA: Diagnosis not present

## 2017-04-03 DIAGNOSIS — N529 Male erectile dysfunction, unspecified: Secondary | ICD-10-CM | POA: Diagnosis not present

## 2017-04-03 DIAGNOSIS — I10 Essential (primary) hypertension: Secondary | ICD-10-CM | POA: Diagnosis not present

## 2017-04-03 DIAGNOSIS — N400 Enlarged prostate without lower urinary tract symptoms: Secondary | ICD-10-CM | POA: Diagnosis not present

## 2017-04-03 DIAGNOSIS — N183 Chronic kidney disease, stage 3 (moderate): Secondary | ICD-10-CM | POA: Diagnosis not present

## 2017-04-03 DIAGNOSIS — E7800 Pure hypercholesterolemia, unspecified: Secondary | ICD-10-CM | POA: Diagnosis not present

## 2017-04-03 DIAGNOSIS — Z125 Encounter for screening for malignant neoplasm of prostate: Secondary | ICD-10-CM | POA: Diagnosis not present

## 2017-04-03 DIAGNOSIS — M109 Gout, unspecified: Secondary | ICD-10-CM | POA: Diagnosis not present

## 2017-04-03 DIAGNOSIS — Z0000 Encounter for general adult medical examination without abnormal findings: Secondary | ICD-10-CM | POA: Diagnosis not present

## 2017-04-22 DIAGNOSIS — J449 Chronic obstructive pulmonary disease, unspecified: Secondary | ICD-10-CM | POA: Diagnosis not present

## 2017-04-24 ENCOUNTER — Ambulatory Visit: Payer: PPO | Admitting: Emergency Medicine

## 2017-04-27 ENCOUNTER — Ambulatory Visit: Payer: PPO | Admitting: Emergency Medicine

## 2017-04-27 DIAGNOSIS — J449 Chronic obstructive pulmonary disease, unspecified: Secondary | ICD-10-CM | POA: Diagnosis not present

## 2017-04-28 ENCOUNTER — Other Ambulatory Visit: Payer: Self-pay | Admitting: Emergency Medicine

## 2017-05-08 ENCOUNTER — Ambulatory Visit (INDEPENDENT_AMBULATORY_CARE_PROVIDER_SITE_OTHER): Payer: PPO | Admitting: Emergency Medicine

## 2017-05-08 ENCOUNTER — Encounter: Payer: Self-pay | Admitting: Emergency Medicine

## 2017-05-08 VITALS — BP 108/68 | HR 115 | Ht 69.0 in | Wt 189.0 lb

## 2017-05-08 DIAGNOSIS — J439 Emphysema, unspecified: Secondary | ICD-10-CM

## 2017-05-08 DIAGNOSIS — R911 Solitary pulmonary nodule: Secondary | ICD-10-CM | POA: Diagnosis not present

## 2017-05-08 DIAGNOSIS — J449 Chronic obstructive pulmonary disease, unspecified: Secondary | ICD-10-CM | POA: Diagnosis not present

## 2017-05-08 NOTE — Progress Notes (Signed)
Subjective:    Patient ID: Charles Archer, male    DOB: 01-20-1936, 81 y.o.   MRN: [id number]  HPI 81 yo former smoker (90 pk-yrs), hx of HTN, hyperlipemia. Has been evaluated at [facility] for DOE. Stress testing and L heart cath done 09/23/13 and 10/22/13 > non-critical CAD, overall reassuring. His SOB has been slowly progressive over 5 yrs, now limiting. Unable to walk hills. He does still golf but clearly a change. No CP, no cough or wheeze.  He was treated with prednisone x 1 in the summer for CP, now ascribed to GERD. He does have ventolin that he uses rarely for cough.   PFT 12/28/13  >> moderate AFL, no BD response, normal volumes, significantly decreased DLCO.  No new sx, still with exertional SOB. No flares. Last time exertional hypoxemia.    ROV 08/21/16 -- this is a follow-up visit for COPD and associated chronic hypoxemic respiratory failure. He also has a history of allergic rhinitis and chronic cough. He has been managed on Spiriva once a day, albuterol as needed. He uses this approximately about 2x a day. He uses oxygen at 2 L/m with exertion. He states that he is less able to walk without wearing his O2. Notices that he desaturates and is dyspneic when he doesn't wear O2.  He is is not requiring flonase frequently.  No flares since last time. Flu shot is up-to-date  ROV 02/15/17 -- Patient has a history of COPD with associated chronic hypoxemic respiratory failure, allergic rhinitis, chronic cough. He was seen 01/19/17 after being treated for an acute exacerbation. He was found to have a left lung density, subsequent CT scan of the chest 01/17/17 showed a wedge-shaped area of possible rounded atelectasis. He underwent chest x-ray today that I have reviewed. This shows stable L lateral -basilar change compared with last 2 month. He is still building up his exercise tolerance. He desaturates on 3L/min pulsed. Currently on Advair, feels that he benefits from it. No longer on a LAMA.    ROV 05/08/17 -- This follow-up visit for patient with a history of COPD, allergic rhinitis and chronic cough, chronic hypoxemic respiratory failure. He has an area of rounded density on serial CT scans of the chest consistent with probable rounded atelectasis. At his last visit we continued Advair, started DuoNeb on a schedule 3 times a day. His oxygen is at 3-4 L/m. He has benefited from the DuoNeb.  He does still cough some. He is using mucinex, thick mucous.    Review of Systems  Constitutional: Negative for fever and unexpected weight change.  HENT: Positive for congestion. Negative for dental problem, ear pain, nosebleeds, postnasal drip, rhinorrhea, sinus pressure, sneezing, sore throat and trouble swallowing.   Eyes: Negative for redness and itching.  Respiratory: Positive for cough. Negative for chest tightness, shortness of breath and wheezing.   Cardiovascular: Negative for palpitations and leg swelling.  Gastrointestinal: Negative for abdominal distention, nausea and vomiting.  Genitourinary: Negative for dysuria.  Musculoskeletal: Negative for joint swelling.  Skin: Negative for rash.  Neurological: Negative for headaches.  Hematological: Does not bruise/bleed easily.  Psychiatric/Behavioral: Negative for dysphoric mood. The patient is not nervous/anxious.        Objective:   Physical Exam Vitals:   05/08/17 1550  BP: 108/68  Pulse: (!) 115  SpO2: 97%  Weight: 189 lb (85.7 kg)  Height: 5\' 9"  (1.753 m)   Gen: Pleasant, well-nourished, in no distress,  normal affect  ENT: No  lesions,  mouth clear,  oropharynx clear, no postnasal drip  Neck: No JVD, no TMG, no carotid bruits  Lungs: No use of accessory muscles, clear without rales or rhonchi  Cardiovascular: RRR, heart sounds normal, no murmur or gallops, no peripheral edema  Musculoskeletal: No deformities, no cyanosis or clubbing  Neuro: alert, non focal  Skin: Warm, no lesions or rashes   12/24/13 --   COMPARISON  No priors.  FINDINGS  Mediastinum: Heart size is normal. There is no significant  pericardial fluid, thickening or pericardial calcification. There is  atherosclerosis of the thoracic aorta, the great vessels of the  mediastinum and the coronary arteries, including calcified  atherosclerotic plaque in the left main, left anterior descending,  left circumflex and right coronary arteries. No pathologically  enlarged mediastinal or hilar lymph nodes. Please note that accurate  exclusion of hilar adenopathy is limited on noncontrast CT scans.  Esophagus is unremarkable in appearance.  Lungs/Pleura: Mild diffuse bronchial wall thickening. Severe  centrilobular and paraseptal emphysema with advanced bullous changes  throughout the upper lobes of the lungs bilaterally, as well as the  lateral aspect of the right lower lobe. High-resolution images  demonstrate no definite regions of subpleural reticulation,  parenchymal banding, traction bronchiectasis or frank honeycombing  to suggest superimposed interstitial lung disease at this time. No  acute consolidative airspace disease. No pleural effusions. No  definite suspicious appearing pulmonary nodules or masses are noted.  Upper Abdomen: Atherosclerosis. Low-attenuation in the hepatic  parenchyma, compatible with hepatic steatosis.  Musculoskeletal: Healing nondisplaced fractures of the anterior  aspects of the left sixth, seventh and eighth ribs. There are no  aggressive appearing lytic or blastic lesions noted in the  visualized portions of the skeleton.  IMPRESSION  1. No evidence of underlying interstitial lung disease.  2. Mild diffuse bronchial wall thickening with severe centrilobular  and paraseptal emphysema; imaging findings suggestive of advanced  COPD.  3. Atherosclerosis, including left main and 3 vessel coronary artery  disease. Assessment for potential risk factor modification, dietary  therapy or pharmacologic  therapy may be warranted, if clinically  indicated.  4. Hepatic steatosis      Assessment & Plan:  Lung nodule Rounded area in the left lower lobe suspect that this is rounded atelectasis. He has a repeat CT scan of the chest planned for October  COPD (chronic obstructive pulmonary disease) We will increase your DuoNeb to four times a day on a schedule.  Keep albuterol available to use 2 puffs up to every 4 hours if needed for shortness of breath.  Stop Advair for now. Keep trach of whether your symptoms improve or worsen with this change.  Please continue your oxygen at 3-4 L/min.  We will make a referral to pulmonary rehab Follow with Dr Delton CoombesByrum in 3 months after your CT scan, or sooner if you have any problems.   Levy Pupaobert Generoso Cropper, MD, PhD 05/08/2017, 4:21 PM [facility] [phone] or if no answer [phone]

## 2017-05-08 NOTE — Patient Instructions (Addendum)
We will increase your DuoNeb to four times a day on a schedule.  Keep albuterol available to use 2 puffs up to every 4 hours if needed for shortness of breath.  Stop Advair for now. Keep trach of whether your symptoms improve or worsen with this change.  Please continue your oxygen at 3-4 L/min.  We will make a referral to pulmonary rehab Get your CT scan chest in October as planned.  Follow with Dr Delton CoombesByrum in 3 months after your CT scan, or sooner if you have any problems.

## 2017-05-08 NOTE — Assessment & Plan Note (Signed)
Rounded area in the left lower lobe suspect that this is rounded atelectasis. He has a repeat CT scan of the chest planned for October

## 2017-05-08 NOTE — Assessment & Plan Note (Signed)
We will increase your DuoNeb to four times a day on a schedule.  Keep albuterol available to use 2 puffs up to every 4 hours if needed for shortness of breath.  Stop Advair for now. Keep trach of whether your symptoms improve or worsen with this change.  Please continue your oxygen at 3-4 L/min.  We will make a referral to pulmonary rehab Follow with Dr Delton CoombesByrum in 3 months after your CT scan, or sooner if you have any problems.

## 2017-05-08 NOTE — Addendum Note (Signed)
Addended by: Jaynee EaglesLEMONS, Viridiana Spaid C on: 05/08/2017 04:24 PM   Modules accepted: Orders

## 2017-05-23 DIAGNOSIS — J449 Chronic obstructive pulmonary disease, unspecified: Secondary | ICD-10-CM | POA: Diagnosis not present

## 2017-05-28 ENCOUNTER — Encounter (HOSPITAL_COMMUNITY): Payer: Self-pay

## 2017-05-28 ENCOUNTER — Telehealth: Payer: Self-pay | Admitting: Emergency Medicine

## 2017-05-28 ENCOUNTER — Encounter (HOSPITAL_COMMUNITY)
Admission: RE | Admit: 2017-05-28 | Discharge: 2017-05-28 | Disposition: A | Discharge status: Home / Self Care | Payer: PPO | Source: Ambulatory Visit | Attending: Emergency Medicine | Admitting: Emergency Medicine

## 2017-05-28 ENCOUNTER — Ambulatory Visit (HOSPITAL_COMMUNITY)
Admission: RE | Admit: 2017-05-28 | Discharge: 2017-05-28 | Disposition: A | Discharge status: Home / Self Care | Payer: PPO | Source: Ambulatory Visit | Attending: Family Medicine | Admitting: Family Medicine

## 2017-05-28 VITALS — BP 137/80 | HR 120 | Resp 18 | Ht 71.0 in | Wt 190.0 lb

## 2017-05-28 DIAGNOSIS — Z87891 Personal history of nicotine dependence: Secondary | ICD-10-CM | POA: Diagnosis not present

## 2017-05-28 DIAGNOSIS — K219 Gastro-esophageal reflux disease without esophagitis: Secondary | ICD-10-CM | POA: Diagnosis not present

## 2017-05-28 DIAGNOSIS — N189 Chronic kidney disease, unspecified: Secondary | ICD-10-CM | POA: Diagnosis not present

## 2017-05-28 DIAGNOSIS — J439 Emphysema, unspecified: Secondary | ICD-10-CM | POA: Diagnosis not present

## 2017-05-28 DIAGNOSIS — E785 Hyperlipidemia, unspecified: Secondary | ICD-10-CM | POA: Diagnosis not present

## 2017-05-28 DIAGNOSIS — Z7982 Long term (current) use of aspirin: Secondary | ICD-10-CM | POA: Insufficient documentation

## 2017-05-28 DIAGNOSIS — Z79899 Other long term (current) drug therapy: Secondary | ICD-10-CM | POA: Insufficient documentation

## 2017-05-28 DIAGNOSIS — M109 Gout, unspecified: Secondary | ICD-10-CM | POA: Diagnosis not present

## 2017-05-28 DIAGNOSIS — I129 Hypertensive chronic kidney disease with stage 1 through stage 4 chronic kidney disease, or unspecified chronic kidney disease: Secondary | ICD-10-CM | POA: Diagnosis not present

## 2017-05-28 DIAGNOSIS — Z7951 Long term (current) use of inhaled steroids: Secondary | ICD-10-CM | POA: Insufficient documentation

## 2017-05-28 DIAGNOSIS — J449 Chronic obstructive pulmonary disease, unspecified: Secondary | ICD-10-CM | POA: Diagnosis not present

## 2017-05-28 NOTE — Telephone Encounter (Addendum)
Presented to pulm rehab for orientation and his resting HR was 120 Last ov 7.15.18 w/ RB his HR = 115 Last ov w/ Dr SwazilandJordan 3 yrs ago HR = 80s >> cardiologist has signed off on him  Pt states HR is 80s at home w/ pulse-ox rhythm strip showed tachycardia, did ekg while at pulm rehab.  Verbiage on ekg states "Inferior infarct , age undetermined Anteroseptal infarct , age undetermined"  Pt is fine, non symptomatic - wonders if this may be a machine error Bowman Nonesortia is requesting EKG be viewed before letting pt go home Of note, Johannes Nonesortia would also like it known that pt is taking Duoneb QID scheduled and suspects this may be contributing  Text message sent to RB

## 2017-05-28 NOTE — Progress Notes (Signed)
Charles Archer 81 y.o. male Pulmonary Rehab Orientation Note Patient arrived today in [facility] for orientation to Pulmonary Rehab. [facility] via wheel chair. He does carry portable oxygen. Per pt, he uses oxygen continuously. Color good, skin warm and dry. Patient is oriented to time and place. Patient's medical history, psychosocial health, and medications reviewed. Psychosocial assessment reveals pt lives with their spouse. There adult son who is a Investment banker, operationalchef is also living with them temporarily. Pt is currently retired. Pt hobbies include watching [employer] and playing golf. Pt reports his stress level is low. Areas of stress/anxiety include Health.  Pt does not exhibit signs of depression. Pt shows good  coping skills with positive outlook. He is offered emotional support and reassurance. Will continue to monitor and evaluate progress toward psychosocial goal(s) of . Physical assessment reveals heart rate is tachycardic. Resting HR 120. Patient denies chest pain, pressure, palpitations. Per protocol rhythm strip and EKG obtained. Read by Dr. Delton CoombesByrum. No intervention needed at this time. Patient educated on warning signs of cardiac and pulmonary emergencies and agreed to seek care if needed. Breath sounds diminished. Grip strength equal, strong. Distal pulses palpable. Patient reports he  does take medications as prescribed. Patient states he follows a Regular diet. The patient reports no specific efforts to gain or lose weight.. Patient's weight will be monitored closely. Demonstration and practice of PLB using pulse oximeter. Patient able to return demonstration satisfactorily. Safety and hand hygiene in the exercise area reviewed with patient. Patient voices understanding of the information reviewed. Department expectations discussed with patient and achievable goals were set. The patient shows enthusiasm about attending the program and we look forward to working with  this nice gentleman. The patient is scheduled for a 6 min walk test on 05/31/17 and to begin exercise on 06/07/17 at 1330.   45 minutes was spent on a variety of activities such as assessment of the patient, obtaining baseline data including height, weight, BMI, and grip strength, verifying medical history, allergies, and current medications, and teaching patient strategies for performing tasks with less respiratory effort with emphasis on pursed lip breathing.

## 2017-05-28 NOTE — Telephone Encounter (Signed)
I reviewed his ECG. He has a bundle branch block, tachycardia, no ST abnormalities. Based on absence of symptoms I do not believe there is any intervention to be made at this time. He would likely bnefit from outpt follow up [facility]Jordan [facility] to insure no other workup needed.

## 2017-05-28 NOTE — Telephone Encounter (Signed)
Message has already been addressed. Will sign off on this message.

## 2017-05-28 NOTE — Telephone Encounter (Signed)
Called Pulmonary Rehab and spoke with Portia. She is aware of RB's response. Nothing further was needed.

## 2017-05-31 ENCOUNTER — Encounter (HOSPITAL_COMMUNITY)
Admission: RE | Admit: 2017-05-31 | Discharge: 2017-05-31 | Disposition: A | Discharge status: Home / Self Care | Payer: PPO | Source: Ambulatory Visit | Attending: Emergency Medicine | Admitting: Emergency Medicine

## 2017-05-31 DIAGNOSIS — J439 Emphysema, unspecified: Secondary | ICD-10-CM

## 2017-05-31 NOTE — Progress Notes (Signed)
Pulmonary Individual Treatment Plan  Patient Details  Name: Charles Archer MRN: [id number] Date of Birth: [birth year] Referring Provider:     Pulmonary Rehab Walk Test from 05/31/2017 in [facility]  Referring Provider  Dr. Delton Coombes      Initial Encounter Date:    Pulmonary Rehab Walk Test from 05/31/2017 in [facility]  Date  05/31/17  Referring Provider  Dr. Delton Coombes      Visit Diagnosis: Pulmonary emphysema, unspecified emphysema type (HCC)  Patient's Home Medications on Admission:   Current Outpatient Prescriptions:  .  acetaminophen (TYLENOL ARTHRITIS PAIN) 650 MG CR tablet, Take 650 mg by mouth every 8 (eight) hours as needed for pain., Disp: , Rfl:  .  ADVAIR DISKUS 250-50 MCG/DOSE AEPB, INHALE ONE PUFF BY MOUTH TWICE A DAY (Patient not taking: Reported on 05/28/2017), Disp: 60 each, Rfl: 4 .  albuterol (PROAIR HFA) 108 (90 Base) MCG/ACT inhaler, Inhale 2 puffs into the lungs every 6 (six) hours as needed for wheezing or shortness of breath., Disp: 1 Inhaler, Rfl: 5 .  allopurinol (ZYLOPRIM) 100 MG tablet, Take 100 mg by mouth daily., Disp: , Rfl:  .  aspirin 81 MG tablet, Take 81 mg by mouth daily., Disp: , Rfl:  .  B Complex-Folic Acid (SUPER B COMPLEX MAXI PO), Take 1 tablet by mouth daily. , Disp: , Rfl:  .  calcium carbonate (OS-CAL) 600 MG TABS tablet, Take 600 mg by mouth daily., Disp: , Rfl:  .  Cinnamon 500 MG capsule, Take 1,000 mg by mouth daily., Disp: , Rfl:  .  docusate sodium (COLACE) 50 MG capsule, Take 50 mg by mouth daily as needed for mild constipation. , Disp: , Rfl:  .  fluorouracil (EFUDEX) 5 % cream, Apply 5 % topically 2 (two) times daily as needed. , Disp: , Rfl:  .  fluticasone (FLONASE) 50 MCG/ACT nasal spray, PLACE 2 SPRAYS INTO BOTH NOSTRILS DAILY., Disp: 16 g, Rfl: 5 .  Ginkgo Biloba 40 MG TABS, Take 40 mg by mouth daily. , Disp: , Rfl:  .  ipratropium-albuterol (DUONEB) 0.5-2.5 (3) MG/3ML SOLN,  Take 3 mLs by nebulization 3 (three) times daily. (Patient taking differently: Take 3 mLs by nebulization every 6 (six) hours as needed. ), Disp: 360 mL, Rfl: 4 .  irbesartan (AVAPRO) 300 MG tablet, Take 1 tablet by mouth daily., Disp: , Rfl:  .  Misc Natural Products (OSTEO BI-FLEX ADV JOINT SHIELD PO), Take 1,500 mg by mouth daily., Disp: , Rfl:  .  Multiple Vitamin (MULTIVITAMIN) capsule, Take 1 capsule by mouth daily., Disp: , Rfl:  .  Naproxen Sodium (ALEVE PO), Take 220 mg by mouth as needed (for pain). , Disp: , Rfl:  .  niacin 250 MG tablet, Take 250 mg by mouth at bedtime., Disp: , Rfl:  .  sildenafil (REVATIO) 20 MG tablet, Take 1 tablet by mouth daily., Disp: , Rfl:  .  tamsulosin (FLOMAX) 0.4 MG CAPS capsule, Take 1 capsule by mouth daily., Disp: , Rfl:  .  vitamin B-12 (CYANOCOBALAMIN) 1000 MCG tablet, Take 1,000 mcg by mouth daily., Disp: , Rfl:  .  zinc gluconate 50 MG tablet, Take 50 mg by mouth daily., Disp: , Rfl:   Past Medical History: Past Medical History:  Diagnosis Date  . Abnormal stress test    s/p cardiac cath 10/2013 - nonobstructive CAD with normal LV function  . CKD (chronic kidney disease)   . COPD (chronic  obstructive pulmonary disease) (HCC)   . Dyspnea on exertion   . Emphysema lung (HCC)   . GERD (gastroesophageal reflux disease)   . Gout   . HTN (hypertension)   . Hyperlipidemia     Tobacco Use: History  Smoking Status  . Former Smoker  . Packs/day: 3.00  . Years: 30.00  . Types: Cigarettes  . Quit date: 09/03/1989  Smokeless Tobacco  . Never Used    Labs: Recent Review Flowsheet Data    There is no flowsheet data to display.      Capillary Blood Glucose: No results found for: GLUCAP   ADL UCSD:     Pulmonary Assessment Scores    Row Name 05/31/17 0901 05/31/17 1627       ADL UCSD   ADL Phase Entry Entry    SOB Score total 93  -      CAT Score   CAT Score 23  Entry  -      mMRC Score   mMRC Score  - 2        Pulmonary Function Assessment:     Pulmonary Function Assessment - 05/28/17 1502      Breath   Bilateral Breath Sounds Decreased   Shortness of Breath Yes;Limiting activity      Exercise Target Goals: Date: 05/31/17  Exercise Program Goal: Individual exercise prescription set with THRR, safety & activity barriers. Participant demonstrates ability to understand and report RPE using BORG scale, to self-measure pulse accurately, and to acknowledge the importance of the exercise prescription.  Exercise Prescription Goal: Starting with aerobic activity 30 plus minutes a day, 3 days per week for initial exercise prescription. Provide home exercise prescription and guidelines that participant acknowledges understanding prior to discharge.  Activity Barriers & Risk Stratification:     Activity Barriers & Cardiac Risk Stratification - 05/28/17 1447      Activity Barriers & Cardiac Risk Stratification   Activity Barriers Deconditioning;Shortness of Breath      6 Minute Walk:     6 Minute Walk    Row Name 05/31/17 1622         6 Minute Walk   Phase Initial     Distance 400 feet     Walk Time -  2 minutes 55 seconds     # of Rest Breaks -  2 (3 minutes 5 seconds total)     MPH 0.75     METS 1.09     RPE 11     Perceived Dyspnea  1     Symptoms Yes (comment)     Comments WHEELCHAIR     Resting HR 107 bpm     Resting BP 125/83     Max Ex. HR 118 bpm     Max Ex. BP 135/78       Interval HR   Baseline HR 107     1 Minute HR 116     2 Minute HR 116     3 Minute HR 111     4 Minute HR 118     5 Minute HR 116     6 Minute HR 113     2 Minute Post HR 103     Interval Heart Rate? Yes       Interval Oxygen   Interval Oxygen? Yes     Baseline Oxygen Saturation % 91 %     Baseline Liters of Oxygen 4 L     1 Minute Oxygen Saturation % 81 %  1 Minute Liters of Oxygen 4 L     2 Minute Oxygen Saturation % 82 %     2 Minute Liters of Oxygen 4 L     3 Minute  Oxygen Saturation % 88 %     3 Minute Liters of Oxygen 4 L     4 Minute Oxygen Saturation % 85 %     4 Minute Liters of Oxygen 4 L     5 Minute Oxygen Saturation % 81 %     5 Minute Liters of Oxygen 4 L     6 Minute Oxygen Saturation % 87 %     6 Minute Liters of Oxygen 4 L     2 Minute Post Oxygen Saturation % 91 %     2 Minute Post Liters of Oxygen 4 L        Oxygen Initial Assessment:     Oxygen Initial Assessment - 05/31/17 1611      Initial 6 min Walk   Oxygen Used Continuous;E-Tanks   Liters per minute 4   Resting Oxygen Saturation  during 6 min walk 91 %   Exercise Oxygen Saturation  during 6 min walk 81 %     Program Oxygen Prescription   Program Oxygen Prescription Continuous   Liters per minute --  unsure since emphysema will contact Dr. Delton Coombes for further guidance      Oxygen Re-Evaluation:   Oxygen Discharge (Final Oxygen Re-Evaluation):   Initial Exercise Prescription:     Initial Exercise Prescription - 05/31/17 1600      Date of Initial Exercise RX and Referring Provider   Date 05/31/17   Referring Provider Dr. Delton Coombes     Oxygen   Oxygen Continuous   Liters 4  4     NuStep   Level 1   Minutes 34   METs 1.2     Arm Ergometer   Level 1   Minutes 17     Prescription Details   Frequency (times per week) 2   Duration Progress to 45 minutes of aerobic exercise without signs/symptoms of physical distress     Intensity   THRR 40-80% of Max Heartrate 56-113   Ratings of Perceived Exertion 11-13   Perceived Dyspnea 0-4     Resistance Training   Training Prescription Yes   Weight blue bands   Reps 10-15      Perform Capillary Blood Glucose checks as needed.  Exercise Prescription Changes:   Exercise Comments:   Exercise Goals and Review:     Exercise Goals    Row Name 05/28/17 1447             Exercise Goals   Increase Physical Activity Yes       Intervention Provide advice, education, support and counseling about  physical activity/exercise needs.;Develop an individualized exercise prescription for aerobic and resistive training based on initial evaluation findings, risk stratification, comorbidities and participant's personal goals.       Expected Outcomes Achievement of increased cardiorespiratory fitness and enhanced flexibility, muscular endurance and strength shown through measurements of functional capacity and personal statement of participant.       Increase Strength and Stamina Yes       Intervention Provide advice, education, support and counseling about physical activity/exercise needs.;Develop an individualized exercise prescription for aerobic and resistive training based on initial evaluation findings, risk stratification, comorbidities and participant's personal goals.       Expected Outcomes Achievement of increased cardiorespiratory fitness and enhanced  flexibility, muscular endurance and strength shown through measurements of functional capacity and personal statement of participant.          Exercise Goals Re-Evaluation :   Discharge Exercise Prescription (Final Exercise Prescription Changes):   Nutrition:  Target Goals: Understanding of nutrition guidelines, daily intake of sodium 1500mg , cholesterol 200mg , calories 30% from fat and 7% or less from saturated fats, daily to have 5 or more servings of fruits and vegetables.  Biometrics:     Pre Biometrics - 05/28/17 1509      Pre Biometrics   Grip Strength 36 kg       Nutrition Therapy Plan and Nutrition Goals:   Nutrition Discharge: Rate Your Plate Scores:   Nutrition Goals Re-Evaluation:   Nutrition Goals Discharge (Final Nutrition Goals Re-Evaluation):   Psychosocial: Target Goals: Acknowledge presence or absence of significant depression and/or stress, maximize coping skills, provide positive support system. Participant is able to verbalize types and ability to use techniques and skills needed for reducing  stress and depression.  Initial Review & Psychosocial Screening:     Initial Psych Review & Screening - 05/28/17 1503      Initial Review   Current issues with None Identified     Family Dynamics   Good Support System? Yes     Barriers   Psychosocial barriers to participate in program There are no identifiable barriers or psychosocial needs.     Screening Interventions   Interventions Encouraged to exercise      Quality of Life Scores:   PHQ-9: Recent Review Flowsheet Data    Depression screen [facility] 2/[facility] 05/28/2017   Decreased Interest 0   Down, Depressed, Hopeless 0   PHQ - 2 Score 0   Altered sleeping 0   Tired, decreased energy 0   Change in appetite 0   Feeling bad or failure about yourself  0   Trouble concentrating 0   Moving slowly or fidgety/restless 0   Suicidal thoughts 0   PHQ-9 Score 0   Difficult doing work/chores Not difficult at all     Interpretation of Total Score  Total Score Depression Severity:  1-4 = Minimal depression, 5-9 = Mild depression, 10-14 = Moderate depression, 15-19 = Moderately severe depression, 20-27 = Severe depression   Psychosocial Evaluation and Intervention:     Psychosocial Evaluation - 05/28/17 1506      Psychosocial Evaluation & Interventions   Interventions Encouraged to exercise with the program and follow exercise prescription   Expected Outcomes patient will remain free from psychosocial barriers to participation   Continue Psychosocial Services  No Follow up required      Psychosocial Re-Evaluation:   Psychosocial Discharge (Final Psychosocial Re-Evaluation):   Education: Education Goals: Education classes will be provided on a weekly basis, covering required topics. Participant will state understanding/return demonstration of topics presented.  Learning Barriers/Preferences:     Learning Barriers/Preferences - 05/28/17 1502      Learning Barriers/Preferences   Learning Barriers None   Learning  Preferences Computer/Internet;Group Instruction;Individual Instruction;Written Material      Education Topics: Risk Factor Reduction:  -Group instruction that is supported by a PowerPoint presentation. Instructor discusses the definition of a risk factor, different risk factors for pulmonary disease, and how the heart and lungs work together.     Nutrition for Pulmonary Patient:  -Group instruction provided by PowerPoint slides, verbal discussion, and written materials to support subject matter. The instructor gives an explanation and review of healthy diet recommendations, which includes a  discussion on weight management, recommendations for fruit and vegetable consumption, as well as protein, fluid, caffeine, fiber, sodium, sugar, and alcohol. Tips for eating when patients are short of breath are discussed.   Pursed Lip Breathing:  -Group instruction that is supported by demonstration and informational handouts. Instructor discusses the benefits of pursed lip and diaphragmatic breathing and detailed demonstration on how to preform both.     Oxygen Safety:  -Group instruction provided by PowerPoint, verbal discussion, and written material to support subject matter. There is an overview of "What is Oxygen" and "Why do we need it".  Instructor also reviews how to create a safe environment for oxygen use, the importance of using oxygen as prescribed, and the risks of noncompliance. There is a brief discussion on traveling with oxygen and resources the patient may utilize.   Oxygen Equipment:  -Group instruction provided by [facility] Staff utilizing handouts, written materials, and equipment demonstrations.   Signs and Symptoms:  -Group instruction provided by written material and verbal discussion to support subject matter. Warning signs and symptoms of infection, stroke, and heart attack are reviewed and when to call the physician/911 reinforced. Tips for preventing the spread of infection  discussed.   Advanced Directives:  -Group instruction provided by verbal instruction and written material to support subject matter. Instructor reviews Advanced Directive laws and proper instruction for filling out document.   Pulmonary Video:  -Group video education that reviews the importance of medication and oxygen compliance, exercise, good nutrition, pulmonary hygiene, and pursed lip and diaphragmatic breathing for the pulmonary patient.   Exercise for the Pulmonary Patient:  -Group instruction that is supported by a PowerPoint presentation. Instructor discusses benefits of exercise, core components of exercise, frequency, duration, and intensity of an exercise routine, importance of utilizing pulse oximetry during exercise, safety while exercising, and options of places to exercise outside of rehab.     Pulmonary Medications:  -Verbally interactive group education provided by instructor with focus on inhaled medications and proper administration.   Anatomy and Physiology of the Respiratory System and Intimacy:  -Group instruction provided by PowerPoint, verbal discussion, and written material to support subject matter. Instructor reviews respiratory cycle and anatomical components of the respiratory system and their functions. Instructor also reviews differences in obstructive and restrictive respiratory diseases with examples of each. Intimacy, Sex, and Sexuality differences are reviewed with a discussion on how relationships can change when diagnosed with pulmonary disease. Common sexual concerns are reviewed.   MD DAY -A group question and answer session with a medical doctor that allows participants to ask questions that relate to their pulmonary disease state.   OTHER EDUCATION -Group or individual verbal, written, or video instructions that support the educational goals of the pulmonary rehab program.   Knowledge Questionnaire Score:     Knowledge Questionnaire Score -  05/31/17 0901      Knowledge Questionnaire Score   Pre Score 10/13      Core Components/Risk Factors/Patient Goals at Admission:     Personal Goals and Risk Factors at Admission - 05/28/17 1503      Core Components/Risk Factors/Patient Goals on Admission   Improve shortness of breath with ADL's Yes   Intervention Provide education, individualized exercise plan and daily activity instruction to help decrease symptoms of SOB with activities of daily living.   Expected Outcomes Short Term: Achieves a reduction of symptoms when performing activities of daily living.   Develop more efficient breathing techniques such as purse lipped breathing and diaphragmatic  breathing; and practicing self-pacing with activity Yes   Intervention Provide education, demonstration and support about specific breathing techniuqes utilized for more efficient breathing. Include techniques such as pursed lipped breathing, diaphragmatic breathing and self-pacing activity.   Expected Outcomes Short Term: Participant will be able to demonstrate and use breathing techniques as needed throughout daily activities.   Increase knowledge of respiratory medications and ability to use respiratory devices properly  Yes   Intervention Provide education and demonstration as needed of appropriate use of medications, inhalers, and oxygen therapy.   Expected Outcomes Short Term: Achieves understanding of medications use. Understands that oxygen is a medication prescribed by physician. Demonstrates appropriate use of inhaler and oxygen therapy.      Core Components/Risk Factors/Patient Goals Review:    Core Components/Risk Factors/Patient Goals at Discharge (Final Review):    ITP Comments:   Comments:

## 2017-06-05 ENCOUNTER — Encounter: Payer: Self-pay | Admitting: Emergency Medicine

## 2017-06-05 MED ORDER — IPRATROPIUM-ALBUTEROL 0.5-2.5 (3) MG/3ML IN SOLN: 3.0000 mL | Freq: Three times a day (TID) | RESPIRATORY_TRACT | 4 refills | Status: DC

## 2017-06-07 ENCOUNTER — Encounter (HOSPITAL_COMMUNITY)
Admission: RE | Admit: 2017-06-07 | Discharge: 2017-06-07 | Disposition: A | Discharge status: Home / Self Care | Payer: PPO | Source: Ambulatory Visit | Attending: Emergency Medicine | Admitting: Emergency Medicine

## 2017-06-07 VITALS — Wt 187.8 lb

## 2017-06-07 DIAGNOSIS — J439 Emphysema, unspecified: Secondary | ICD-10-CM

## 2017-06-07 MED ORDER — IPRATROPIUM-ALBUTEROL 0.5-2.5 (3) MG/3ML IN SOLN: 3.0000 mL | Freq: Three times a day (TID) | RESPIRATORY_TRACT | 5 refills | Status: DC

## 2017-06-07 NOTE — Progress Notes (Signed)
Daily Session Note  Patient Details  Name: Charles Archer MRN: [id number] Date of Birth: 04/20/1936 Referring Provider:     Pulmonary Rehab Walk Test from 05/31/2017 in [place]  Referring Provider  Dr. Lamonte Sakai      Encounter Date: 06/07/2017  Check In:     Session Check In - 06/07/17 1030      Check-In   Location [facility]   Staff Present Su Hilt, MS, ACSM RCEP, Exercise Physiologist;Portia Rollene Rotunda, RN, BSN   Supervising physician immediately available to respond to emergencies [facility] immediately available   Physician(s) Dr. Clementeen Graham   Medication changes reported     No   Fall or balance concerns reported    No   Tobacco Cessation No Change   Warm-up and Cool-down Performed as group-led instruction   Resistance Training Performed Yes   VAD Patient? No     Pain Assessment   Currently in Pain? No/denies   Multiple Pain Sites No      Capillary Blood Glucose: No results found for this or any previous visit (from the past 24 hour(s)).      Exercise Prescription Changes - 06/07/17 1200      Response to Exercise   Blood Pressure (Admit) 142/70   Blood Pressure (Exercise) 126/62   Blood Pressure (Exit) 102/62   Heart Rate (Admit) 102 bpm   Heart Rate (Exercise) 107 bpm   Heart Rate (Exit) 84 bpm   Oxygen Saturation (Admit) 98 %   Oxygen Saturation (Exercise) 89 %   Oxygen Saturation (Exit) 97 %   Rating of Perceived Exertion (Exercise) 13   Perceived Dyspnea (Exercise) 2   Duration Progress to 45 minutes of aerobic exercise without signs/symptoms of physical distress   Intensity THRR unchanged     Progression   Progression Continue to progress workloads to maintain intensity without signs/symptoms of physical distress.     Resistance Training   Training Prescription Yes   Weight blue bands   Reps 10-15   Time 10 Minutes     Oxygen   Oxygen Continuous   Liters 4     NuStep   Level 2   Minutes 17   METs 2.1     Arm Ergometer   Level 1   Minutes 17      History  Smoking Status  . Former Smoker  . Packs/day: 3.00  . Years: 30.00  . Types: Cigarettes  . Quit date: 09/03/1989  Smokeless Tobacco  . Never Used    Goals Met:  Exercise tolerated well No report of cardiac concerns or symptoms Strength training completed today  Goals Unmet:  Not Applicable  Comments: Service time is from 1030 to 1230    Dr. Rush Farmer is Medical Director for Pulmonary Rehab at [facility].

## 2017-06-08 ENCOUNTER — Telehealth: Payer: Self-pay | Admitting: Emergency Medicine

## 2017-06-08 MED ORDER — IPRATROPIUM-ALBUTEROL 0.5-2.5 (3) MG/3ML IN SOLN: 3.0000 mL | Freq: Four times a day (QID) | RESPIRATORY_TRACT | 5 refills | Status: DC

## 2017-06-08 NOTE — Telephone Encounter (Signed)
Pt's Duoneb rx has been corrected and resent. Nothing further was needed.

## 2017-06-12 ENCOUNTER — Encounter (HOSPITAL_COMMUNITY)
Admission: RE | Admit: 2017-06-12 | Discharge: 2017-06-12 | Disposition: A | Discharge status: Home / Self Care | Payer: PPO | Source: Ambulatory Visit | Attending: Emergency Medicine | Admitting: Emergency Medicine

## 2017-06-12 VITALS — Wt 189.4 lb

## 2017-06-12 DIAGNOSIS — J439 Emphysema, unspecified: Secondary | ICD-10-CM

## 2017-06-12 NOTE — Progress Notes (Signed)
Daily Session Note  Patient Details  Name: Charles Archer MRN: [id number] Date of Birth: 10-27-1935 Referring Provider:     Pulmonary Rehab Walk Test from 05/31/2017 in [place]  Referring Provider  Dr. Lamonte Sakai      Encounter Date: 06/12/2017  Check In:     Session Check In - 06/12/17 1158      Check-In   Location [facility]   Staff Present Su Hilt, MS, ACSM RCEP, Exercise Physiologist;Portia Rollene Rotunda, RN, BSN;Other   Supervising physician immediately available to respond to emergencies [facility] immediately available   Physician(s) Dr. Clementeen Graham   Medication changes reported     No   Fall or balance concerns reported    No   Tobacco Cessation No Change   Warm-up and Cool-down Performed as group-led instruction   Resistance Training Performed Yes   VAD Patient? No     Pain Assessment   Currently in Pain? No/denies   Multiple Pain Sites No      Capillary Blood Glucose: No results found for this or any previous visit (from the past 24 hour(s)).      Exercise Prescription Changes - 06/12/17 1200      Response to Exercise   Blood Pressure (Admit) 112/60   Blood Pressure (Exercise) 138/62   Blood Pressure (Exit) 106/60   Heart Rate (Admit) 95 bpm   Heart Rate (Exercise) 100 bpm   Heart Rate (Exit) 80 bpm   Oxygen Saturation (Admit) 95 %   Oxygen Saturation (Exercise) 93 %   Oxygen Saturation (Exit) 98 %   Rating of Perceived Exertion (Exercise) 12   Perceived Dyspnea (Exercise) 1   Duration Progress to 45 minutes of aerobic exercise without signs/symptoms of physical distress   Intensity Other (comment)  40-80% of HRR     Progression   Progression Continue to progress workloads to maintain intensity without signs/symptoms of physical distress.     Resistance Training   Training Prescription Yes   Weight blue bands   Reps 10-15   Time 10 Minutes     Oxygen   Oxygen Continuous   Liters 4     NuStep   Level 2   Minutes 34   METs 2     Arm Ergometer   Level 2   Minutes 17      History  Smoking Status  . Former Smoker  . Packs/day: 3.00  . Years: 30.00  . Types: Cigarettes  . Quit date: 09/03/1989  Smokeless Tobacco  . Never Used    Goals Met:  Exercise tolerated well No report of cardiac concerns or symptoms Strength training completed today  Goals Unmet:  Not Applicable  Comments: Service time is from 10:30a to 12:10p    Dr. Rush Farmer is Medical Director for Pulmonary Rehab at [facility].

## 2017-06-13 NOTE — Progress Notes (Signed)
Pulmonary Individual Treatment Plan  Patient Details  Name: Charles Archer MRN: [id number] Date of Birth: 02-Dec-1935 Referring Provider:     Pulmonary Rehab Walk Test from 05/31/2017 in [place]  Referring Provider  Dr. Lamonte Sakai      Initial Encounter Date:    Pulmonary Rehab Walk Test from 05/31/2017 in [place]  Date  05/31/17  Referring Provider  Dr. Lamonte Sakai      Visit Diagnosis: Pulmonary emphysema, unspecified emphysema type ([place])  Patient's Home Medications on Admission:   Current Outpatient Prescriptions:  .  acetaminophen (TYLENOL ARTHRITIS PAIN) 650 MG CR tablet, Take 650 mg by mouth every 8 (eight) hours as needed for pain., Disp: , Rfl:  .  ADVAIR DISKUS 250-50 MCG/DOSE AEPB, INHALE ONE PUFF BY MOUTH TWICE A DAY (Patient not taking: Reported on 05/28/2017), Disp: 60 each, Rfl: 4 .  albuterol (PROAIR HFA) 108 (90 Base) MCG/ACT inhaler, Inhale 2 puffs into the lungs every 6 (six) hours as needed for wheezing or shortness of breath., Disp: 1 Inhaler, Rfl: 5 .  allopurinol (ZYLOPRIM) 100 MG tablet, Take 100 mg by mouth daily., Disp: , Rfl:  .  aspirin 81 MG tablet, Take 81 mg by mouth daily., Disp: , Rfl:  .  B Complex-Folic Acid (SUPER B COMPLEX MAXI PO), Take 1 tablet by mouth daily. , Disp: , Rfl:  .  calcium carbonate (OS-CAL) 600 MG TABS tablet, Take 600 mg by mouth daily., Disp: , Rfl:  .  Cinnamon 500 MG capsule, Take 1,000 mg by mouth daily., Disp: , Rfl:  .  docusate sodium (COLACE) 50 MG capsule, Take 50 mg by mouth daily as needed for mild constipation. , Disp: , Rfl:  .  fluorouracil (EFUDEX) 5 % cream, Apply 5 % topically 2 (two) times daily as needed. , Disp: , Rfl:  .  fluticasone (FLONASE) 50 MCG/ACT nasal spray, PLACE 2 SPRAYS INTO BOTH NOSTRILS DAILY., Disp: 16 g, Rfl: 5 .  Ginkgo Biloba 40 MG TABS, Take 40 mg by mouth daily. , Disp: , Rfl:  .  ipratropium-albuterol (DUONEB) 0.5-2.5 (3) MG/3ML SOLN,  Take 3 mLs by nebulization 4 (four) times daily., Disp: 360 mL, Rfl: 5 .  irbesartan (AVAPRO) 300 MG tablet, Take 1 tablet by mouth daily., Disp: , Rfl:  .  Misc Natural Products (OSTEO BI-FLEX ADV JOINT SHIELD PO), Take 1,500 mg by mouth daily., Disp: , Rfl:  .  Multiple Vitamin (MULTIVITAMIN) capsule, Take 1 capsule by mouth daily., Disp: , Rfl:  .  Naproxen Sodium (ALEVE PO), Take 220 mg by mouth as needed (for pain). , Disp: , Rfl:  .  niacin 250 MG tablet, Take 250 mg by mouth at bedtime., Disp: , Rfl:  .  sildenafil (REVATIO) 20 MG tablet, Take 1 tablet by mouth daily., Disp: , Rfl:  .  tamsulosin (FLOMAX) 0.4 MG CAPS capsule, Take 1 capsule by mouth daily., Disp: , Rfl:  .  vitamin B-12 (CYANOCOBALAMIN) 1000 MCG tablet, Take 1,000 mcg by mouth daily., Disp: , Rfl:  .  zinc gluconate 50 MG tablet, Take 50 mg by mouth daily., Disp: , Rfl:   Past Medical History: Past Medical History:  Diagnosis Date  . Abnormal stress test    s/p cardiac cath 10/2013 - nonobstructive CAD with normal LV function  . CKD (chronic kidney disease)   . COPD (chronic obstructive pulmonary disease) ([place])   . Dyspnea on exertion   . Emphysema lung (  [facility])   . GERD (gastroesophageal reflux disease)   . Gout   . HTN (hypertension)   . Hyperlipidemia     Tobacco Use: History  Smoking Status  . Former Smoker  . Packs/day: 3.00  . Years: 30.00  . Types: Cigarettes  . Quit date: 09/03/1989  Smokeless Tobacco  . Never Used    Labs: Recent Review Flowsheet Data    There is no flowsheet data to display.      Capillary Blood Glucose: No results found for: GLUCAP   Pulmonary Assessment Scores:     Pulmonary Assessment Scores    Row Name 05/31/17 0901 05/31/17 1627       ADL UCSD   ADL Phase Entry Entry    SOB Score total 93  -      CAT Score   CAT Score 23  Entry  -      mMRC Score   mMRC Score  - 2       Pulmonary Function Assessment:     Pulmonary Function Assessment - 05/28/17  1502      Breath   Bilateral Breath Sounds Decreased   Shortness of Breath Yes;Limiting activity      Exercise Target Goals:    Exercise Program Goal: Individual exercise prescription set with THRR, safety & activity barriers. Participant demonstrates ability to understand and report RPE using BORG scale, to self-measure pulse accurately, and to acknowledge the importance of the exercise prescription.  Exercise Prescription Goal: Starting with aerobic activity 30 plus minutes a day, 3 days per week for initial exercise prescription. Provide home exercise prescription and guidelines that participant acknowledges understanding prior to discharge.  Activity Barriers & Risk Stratification:     Activity Barriers & Cardiac Risk Stratification - 05/28/17 1447      Activity Barriers & Cardiac Risk Stratification   Activity Barriers Deconditioning;Shortness of Breath      6 Minute Walk:     6 Minute Walk    Row Name 05/31/17 1622         6 Minute Walk   Phase Initial     Distance 400 feet     Walk Time -  2 minutes 55 seconds     # of Rest Breaks -  2 (3 minutes 5 seconds total)     MPH 0.75     METS 1.09     RPE 11     Perceived Dyspnea  1     Symptoms Yes (comment)     Comments WHEELCHAIR     Resting HR 107 bpm     Resting BP 125/83     Max Ex. HR 118 bpm     Max Ex. BP 135/78       Interval HR   Baseline HR (retired) 107     1 Minute HR 116     2 Minute HR 116     3 Minute HR 111     4 Minute HR 118     5 Minute HR 116     6 Minute HR 113     2 Minute Post HR 103     Interval Heart Rate? Yes       Interval Oxygen   Interval Oxygen? Yes     Baseline Oxygen Saturation % 91 %     Resting Liters of Oxygen 4 L     1 Minute Oxygen Saturation % 81 %     1 Minute Liters of Oxygen 4 L  2 Minute Oxygen Saturation % 82 %     2 Minute Liters of Oxygen 4 L     3 Minute Oxygen Saturation % 88 %     3 Minute Liters of Oxygen 4 L     4 Minute Oxygen Saturation %  85 %     4 Minute Liters of Oxygen 4 L     5 Minute Oxygen Saturation % 81 %     5 Minute Liters of Oxygen 4 L     6 Minute Oxygen Saturation % 87 %     6 Minute Liters of Oxygen 4 L     2 Minute Post Oxygen Saturation % 91 %     2 Minute Post Liters of Oxygen 4 L        Oxygen Initial Assessment:     Oxygen Initial Assessment - 05/31/17 1611      Initial 6 min Walk   Oxygen Used Continuous;E-Tanks   Liters per minute 4   Resting Oxygen Saturation  91 %   Exercise Oxygen Saturation  during 6 min walk 81 %     Program Oxygen Prescription   Program Oxygen Prescription Continuous   Liters per minute --  unsure since emphysema will contact Dr. Lamonte Sakai for further guidance      Oxygen Re-Evaluation:     Oxygen Re-Evaluation    Row Name 06/13/17 1816             Program Oxygen Prescription   Program Oxygen Prescription Continuous       Liters per minute 4       Comments he does desaturate on 4 liters and requires rest breaks while walking the track. will consult with MD regarding intolerance with ambulation on 4 liters         Home Oxygen   Home Oxygen Device Home Concentrator;E-Tanks       Sleep Oxygen Prescription Continuous       Liters per minute 3       Home Exercise Oxygen Prescription Pulsed  patient only has pulsed regulator on portable tank       Liters per minute 4       Home at Rest Exercise Oxygen Prescription Continuous       Compliance with Home Oxygen Use Yes         Goals/Expected Outcomes   Short Term Goals To learn and exhibit compliance with exercise, home and travel O2 prescription;To learn and understand importance of monitoring SPO2 with pulse oximeter and demonstrate accurate use of the pulse oximeter.;To learn and understand importance of maintaining oxygen saturations>88%;To learn and demonstrate proper pursed lip breathing techniques or other breathing techniques.;To learn and demonstrate proper use of respiratory medications       Long  Term  Goals Exhibits compliance with exercise, home and travel O2 prescription;Verbalizes importance of monitoring SPO2 with pulse oximeter and return demonstration;Maintenance of O2 saturations>88%;Compliance with respiratory medication;Exhibits proper breathing techniques, such as pursed lip breathing or other method taught during program session          Oxygen Discharge (Final Oxygen Re-Evaluation):     Oxygen Re-Evaluation - 06/13/17 1816      Program Oxygen Prescription   Program Oxygen Prescription Continuous   Liters per minute 4   Comments he does desaturate on 4 liters and requires rest breaks while walking the track. will consult with MD regarding intolerance with ambulation on 4 liters     [place]  Oxygen Device Home Concentrator;E-Tanks   Sleep Oxygen Prescription Continuous   Liters per minute 3   Home Exercise Oxygen Prescription Pulsed  patient only has pulsed regulator on portable tank   Liters per minute 4   Home at Rest Exercise Oxygen Prescription Continuous   Compliance with Home Oxygen Use Yes     Goals/Expected Outcomes   Short Term Goals To learn and exhibit compliance with exercise, home and travel O2 prescription;To learn and understand importance of monitoring SPO2 with pulse oximeter and demonstrate accurate use of the pulse oximeter.;To learn and understand importance of maintaining oxygen saturations>88%;To learn and demonstrate proper pursed lip breathing techniques or other breathing techniques.;To learn and demonstrate proper use of respiratory medications   Long  Term Goals Exhibits compliance with exercise, home and travel O2 prescription;Verbalizes importance of monitoring SPO2 with pulse oximeter and return demonstration;Maintenance of O2 saturations>88%;Compliance with respiratory medication;Exhibits proper breathing techniques, such as pursed lip breathing or other method taught during program session      Initial Exercise Prescription:      Initial Exercise Prescription - 05/31/17 1600      Date of Initial Exercise RX and Referring Provider   Date 05/31/17   Referring Provider Dr. Lamonte Sakai     Oxygen   Oxygen Continuous   Liters 4  4     NuStep   Level 1   Minutes 34   METs 1.2     Arm Ergometer   Level 1   Minutes 17     Prescription Details   Frequency (times per week) 2   Duration Progress to 45 minutes of aerobic exercise without signs/symptoms of physical distress     Intensity   THRR 40-80% of Max Heartrate 56-113   Ratings of Perceived Exertion 11-13   Perceived Dyspnea 0-4     Resistance Training   Training Prescription Yes   Weight blue bands   Reps 10-15      Perform Capillary Blood Glucose checks as needed.  Exercise Prescription Changes:     Exercise Prescription Changes    Row Name 06/07/17 1200 06/12/17 1200           Response to Exercise   Blood Pressure (Admit) 142/70 112/60      Blood Pressure (Exercise) 126/62 138/62      Blood Pressure (Exit) 102/62 106/60      Heart Rate (Admit) 102 bpm 95 bpm      Heart Rate (Exercise) 107 bpm 100 bpm      Heart Rate (Exit) 84 bpm 80 bpm      Oxygen Saturation (Admit) 98 % 95 %      Oxygen Saturation (Exercise) 89 % 93 %      Oxygen Saturation (Exit) 97 % 98 %      Rating of Perceived Exertion (Exercise) 13 12      Perceived Dyspnea (Exercise) 2 1      Duration Progress to 45 minutes of aerobic exercise without signs/symptoms of physical distress Progress to 45 minutes of aerobic exercise without signs/symptoms of physical distress      Intensity Other (comment)  40-80% of HRR Other (comment)  40-80% of HRR        Progression   Progression Continue to progress workloads to maintain intensity without signs/symptoms of physical distress. Continue to progress workloads to maintain intensity without signs/symptoms of physical distress.        Resistance Training   Training Prescription Yes Yes  Weight blue bands blue bands       Reps 10-15 10-15      Time 10 Minutes 10 Minutes        Oxygen   Oxygen Continuous Continuous      Liters 4 4        NuStep   Level 2 2      Minutes 17 34      METs 2.1 2        Arm Ergometer   Level 1 2      Minutes 17 17         Exercise Comments:   Exercise Goals and Review:     Exercise Goals    Row Name 05/28/17 1447             Exercise Goals   Increase Physical Activity Yes       Intervention Provide advice, education, support and counseling about physical activity/exercise needs.;Develop an individualized exercise prescription for aerobic and resistive training based on initial evaluation findings, risk stratification, comorbidities and participant's personal goals.       Expected Outcomes Achievement of increased cardiorespiratory fitness and enhanced flexibility, muscular endurance and strength shown through measurements of functional capacity and personal statement of participant.       Increase Strength and Stamina Yes       Intervention Provide advice, education, support and counseling about physical activity/exercise needs.;Develop an individualized exercise prescription for aerobic and resistive training based on initial evaluation findings, risk stratification, comorbidities and participant's personal goals.       Expected Outcomes Achievement of increased cardiorespiratory fitness and enhanced flexibility, muscular endurance and strength shown through measurements of functional capacity and personal statement of participant.          Exercise Goals Re-Evaluation :     Exercise Goals Re-Evaluation    Row Name 06/12/17 0830             Exercise Goal Re-Evaluation   Exercise Goals Review Increase Physical Activity;Increase Strength and Stamina;Able to understand and use Dyspnea scale;Able to understand and use rate of perceived exertion (RPE) scale;Knowledge and understanding of Target Heart Rate Range (THRR);Understanding of Exercise Prescription        Comments Patient has only attended one exercise session. Will cont to progress as able.        Expected Outcomes Through exercise and education at rehab and at home, patient will increase strength and stamina. Patient will also gain a better understanding of the need for physical activity on a daily basis and the effects it can have on quality of life.          Discharge Exercise Prescription (Final Exercise Prescription Changes):     Exercise Prescription Changes - 06/12/17 1200      Response to Exercise   Blood Pressure (Admit) 112/60   Blood Pressure (Exercise) 138/62   Blood Pressure (Exit) 106/60   Heart Rate (Admit) 95 bpm   Heart Rate (Exercise) 100 bpm   Heart Rate (Exit) 80 bpm   Oxygen Saturation (Admit) 95 %   Oxygen Saturation (Exercise) 93 %   Oxygen Saturation (Exit) 98 %   Rating of Perceived Exertion (Exercise) 12   Perceived Dyspnea (Exercise) 1   Duration Progress to 45 minutes of aerobic exercise without signs/symptoms of physical distress   Intensity Other (comment)  40-80% of HRR     Progression   Progression Continue to progress workloads to maintain intensity without signs/symptoms of physical distress.  Resistance Training   Training Prescription Yes   Weight blue bands   Reps 10-15   Time 10 Minutes     Oxygen   Oxygen Continuous   Liters 4     NuStep   Level 2   Minutes 34   METs 2     Arm Ergometer   Level 2   Minutes 17      Nutrition:  Target Goals: Understanding of nutrition guidelines, daily intake of sodium <1533m, cholesterol <2056m calories 30% from fat and 7% or less from saturated fats, daily to have 5 or more servings of fruits and vegetables.  Biometrics:     Pre Biometrics - 05/28/17 1509      Pre Biometrics   Grip Strength 36 kg       Nutrition Therapy Plan and Nutrition Goals:   Nutrition Discharge: Rate Your Plate Scores:   Nutrition Goals Re-Evaluation:   Nutrition Goals Discharge (Final  Nutrition Goals Re-Evaluation):   Psychosocial: Target Goals: Acknowledge presence or absence of significant depression and/or stress, maximize coping skills, provide positive support system. Participant is able to verbalize types and ability to use techniques and skills needed for reducing stress and depression.  Initial Review & Psychosocial Screening:     Initial Psych Review & Screening - 05/28/17 1503      Initial Review   Current issues with None Identified     Family Dynamics   Good Support System? Yes     Barriers   Psychosocial barriers to participate in program There are no identifiable barriers or psychosocial needs.     Screening Interventions   Interventions Encouraged to exercise      Quality of Life Scores:   PHQ-9: Recent Review Flowsheet Data    [facility]   Decreased Interest 0   Down, Depressed, Hopeless 0   PHQ - 2 Score 0   Altered sleeping 0   Tired, decreased energy 0   Change in appetite 0   Feeling bad or failure about yourself  0   Trouble concentrating 0   Moving slowly or fidgety/restless 0   Suicidal thoughts 0   PHQ-9 Score 0   Difficult doing work/chores Not difficult at all     Interpretation of Total Score  Total Score Depression Severity:  1-4 = Minimal depression, 5-9 = Mild depression, 10-14 = Moderate depression, 15-19 = Moderately severe depression, 20-27 = Severe depression   Psychosocial Evaluation and Intervention:     Psychosocial Evaluation - 05/28/17 1506      Psychosocial Evaluation & Interventions   Interventions Encouraged to exercise with the program and follow exercise prescription   Expected Outcomes patient will remain free from psychosocial barriers to participation   Continue Psychosocial Services  No Follow up required      Psychosocial Re-Evaluation:     Psychosocial Re-Evaluation    [place] 06/13/17 1820             Psychosocial Re-Evaluation   Current issues with  None Identified       Expected Outcomes patient will remain free from psychosocial barriers to participation in pulmonary rehab        Continue Psychosocial Services  No Follow up required          Psychosocial Discharge (Final Psychosocial Re-Evaluation):     Psychosocial Re-Evaluation - 06/13/17 1820      Psychosocial Re-Evaluation   Current issues with None Identified   Expected Outcomes patient will remain free  from psychosocial barriers to participation in pulmonary rehab    Continue Psychosocial Services  No Follow up required      Education: Education Goals: Education classes will be provided on a weekly basis, covering required topics. Participant will state understanding/return demonstration of topics presented.  Learning Barriers/Preferences:     Learning Barriers/Preferences - 05/28/17 1502      Learning Barriers/Preferences   Learning Barriers None   Learning Preferences Computer/Internet;Group Instruction;Individual Instruction;Written Material      Education Topics: Risk Factor Reduction:  -Group instruction that is supported by a PowerPoint presentation. Instructor discusses the definition of a risk factor, different risk factors for pulmonary disease, and how the heart and lungs work together.     Nutrition for Pulmonary Patient:  -Group instruction provided by PowerPoint slides, verbal discussion, and written materials to support subject matter. The instructor gives an explanation and review of healthy diet recommendations, which includes a discussion on weight management, recommendations for fruit and vegetable consumption, as well as protein, fluid, caffeine, fiber, sodium, sugar, and alcohol. Tips for eating when patients are short of breath are discussed.   PULMONARY REHAB OTHER RESPIRATORY from 06/07/2017 in [place]  Date  06/07/17  Educator  Nutritionist  Instruction Review Code  2- meets goals/outcomes      Pursed  Lip Breathing:  -Group instruction that is supported by demonstration and informational handouts. Instructor discusses the benefits of pursed lip and diaphragmatic breathing and detailed demonstration on how to preform both.     Oxygen Safety:  -Group instruction provided by PowerPoint, verbal discussion, and written material to support subject matter. There is an overview of "What is Oxygen" and "Why do we need it".  Instructor also reviews how to create a safe environment for oxygen use, the importance of using oxygen as prescribed, and the risks of noncompliance. There is a brief discussion on traveling with oxygen and resources the patient may utilize.   Oxygen Equipment:  -Group instruction provided by [facility] Staff utilizing handouts, written materials, and equipment demonstrations.   Signs and Symptoms:  -Group instruction provided by written material and verbal discussion to support subject matter. Warning signs and symptoms of infection, stroke, and heart attack are reviewed and when to call the physician/911 reinforced. Tips for preventing the spread of infection discussed.   Advanced Directives:  -Group instruction provided by verbal instruction and written material to support subject matter. Instructor reviews Advanced Directive laws and proper instruction for filling out document.   Pulmonary Video:  -Group video education that reviews the importance of medication and oxygen compliance, exercise, good nutrition, pulmonary hygiene, and pursed lip and diaphragmatic breathing for the pulmonary patient.   Exercise for the Pulmonary Patient:  -Group instruction that is supported by a PowerPoint presentation. Instructor discusses benefits of exercise, core components of exercise, frequency, duration, and intensity of an exercise routine, importance of utilizing pulse oximetry during exercise, safety while exercising, and options of places to exercise outside of rehab.      Pulmonary Medications:  -Verbally interactive group education provided by instructor with focus on inhaled medications and proper administration.   Anatomy and Physiology of the Respiratory System and Intimacy:  -Group instruction provided by PowerPoint, verbal discussion, and written material to support subject matter. Instructor reviews respiratory cycle and anatomical components of the respiratory system and their functions. Instructor also reviews differences in obstructive and restrictive respiratory diseases with examples of each. Intimacy, Sex, and Sexuality differences are reviewed with  a discussion on how relationships can change when diagnosed with pulmonary disease. Common sexual concerns are reviewed.   MD DAY -A group question and answer session with a medical doctor that allows participants to ask questions that relate to their pulmonary disease state.   OTHER EDUCATION -Group or individual verbal, written, or video instructions that support the educational goals of the pulmonary rehab program.   Knowledge Questionnaire Score:     Knowledge Questionnaire Score - 05/31/17 0901      Knowledge Questionnaire Score   Pre Score 10/13      Core Components/Risk Factors/Patient Goals at Admission:     Personal Goals and Risk Factors at Admission - 05/28/17 1503      Core Components/Risk Factors/Patient Goals on Admission   Improve shortness of breath with ADL's Yes   Intervention Provide education, individualized exercise plan and daily activity instruction to help decrease symptoms of SOB with activities of daily living.   Expected Outcomes Short Term: Achieves a reduction of symptoms when performing activities of daily living.   Develop more efficient breathing techniques such as purse lipped breathing and diaphragmatic breathing; and practicing self-pacing with activity Yes   Intervention Provide education, demonstration and support about specific breathing  techniuqes utilized for more efficient breathing. Include techniques such as pursed lipped breathing, diaphragmatic breathing and self-pacing activity.   Expected Outcomes Short Term: Participant will be able to demonstrate and use breathing techniques as needed throughout daily activities.   Increase knowledge of respiratory medications and ability to use respiratory devices properly  Yes   Intervention Provide education and demonstration as needed of appropriate use of medications, inhalers, and oxygen therapy.   Expected Outcomes Short Term: Achieves understanding of medications use. Understands that oxygen is a medication prescribed by physician. Demonstrates appropriate use of inhaler and oxygen therapy.      Core Components/Risk Factors/Patient Goals Review:      Goals and Risk Factor Review    Row Name 06/13/17 1819             Core Components/Risk Factors/Patient Goals Review   Personal Goals Review Improve shortness of breath with ADL's;Develop more efficient breathing techniques such as purse lipped breathing and diaphragmatic breathing and practicing self-pacing with activity.;Increase knowledge of respiratory medications and ability to use respiratory devices properly.       Review patient has only attended 2 sessions since admission. will evaluate progression towards goals over the next 30 days       Expected Outcomes see admission outcomes          Core Components/Risk Factors/Patient Goals at Discharge (Final Review):      Goals and Risk Factor Review - 06/13/17 1819      Core Components/Risk Factors/Patient Goals Review   Personal Goals Review Improve shortness of breath with ADL's;Develop more efficient breathing techniques such as purse lipped breathing and diaphragmatic breathing and practicing self-pacing with activity.;Increase knowledge of respiratory medications and ability to use respiratory devices properly.   Review patient has only attended 2 sessions since  admission. will evaluate progression towards goals over the next 30 days   Expected Outcomes see admission outcomes      ITP Comments:   Comments: ITP REVIEW Unable to evaluate progress toward pulmonary rehab goals after completing 2 sessions. Expect to see progression over next 30 days. Recommend continued exercise, life style modification, education, and utilization of breathing techniques to increase stamina and strength and decrease shortness of breath with exertion.

## 2017-06-14 ENCOUNTER — Encounter (HOSPITAL_COMMUNITY)
Admission: RE | Admit: 2017-06-14 | Discharge: 2017-06-14 | Disposition: A | Discharge status: Home / Self Care | Payer: PPO | Source: Ambulatory Visit | Attending: Emergency Medicine | Admitting: Emergency Medicine

## 2017-06-14 VITALS — Wt 188.1 lb

## 2017-06-14 DIAGNOSIS — J439 Emphysema, unspecified: Secondary | ICD-10-CM

## 2017-06-14 NOTE — Progress Notes (Signed)
Daily Session Note  Patient Details  Name: [name] MRN: [id number] Date of Birth: November 15, 1935 Referring Provider:     Pulmonary Rehab Walk Test from 05/31/2017 in [place]  Referring Provider  Dr. Lamonte Sakai      Encounter Date: 06/14/2017  Check In:     Session Check In - 06/14/17 1030      Check-In   Location [facility]   Staff Present Ramon Dredge, RN, MHA;Lisa Ysidro Evert, RN;Tamar Lipscomb Rollene Rotunda, RN, BSN   Supervising physician immediately available to respond to emergencies [facility] immediately available   Physician(s) Dr. Justus Memory   Medication changes reported     No   Fall or balance concerns reported    No   Tobacco Cessation No Change   Warm-up and Cool-down Performed as group-led instruction   Resistance Training Performed Yes   VAD Patient? No     Pain Assessment   Currently in Pain? No/denies   Multiple Pain Sites No      Capillary Blood Glucose: No results found for this or any previous visit (from the past 24 hour(s)).      Exercise Prescription Changes - 06/14/17 1256      Response to Exercise   Blood Pressure (Admit) 110/62   Blood Pressure (Exercise) 140/80   Blood Pressure (Exit) 114/62   Heart Rate (Admit) 83 bpm   Heart Rate (Exercise) 106 bpm   Heart Rate (Exit) 85 bpm   Oxygen Saturation (Admit) 97 %   Oxygen Saturation (Exercise) 79 %   Oxygen Saturation (Exit) 98 %   Rating of Perceived Exertion (Exercise) 15   Perceived Dyspnea (Exercise) 4   Duration Progress to 45 minutes of aerobic exercise without signs/symptoms of physical distress   Intensity Other (comment)  40-80% of HRR     Progression   Progression Continue to progress workloads to maintain intensity without signs/symptoms of physical distress.     Resistance Training   Training Prescription Yes   Weight blue bands   Reps 10-15   Time 10 Minutes     Oxygen   Oxygen Continuous   Liters 4     NuStep   Level 2   Minutes 17   METs 2.1     Track   Laps 5   Minutes 17      History  Smoking Status  . Former Smoker  . Packs/day: 3.00  . Years: 30.00  . Types: Cigarettes  . Quit date: 09/03/1989  Smokeless Tobacco  . Never Used    Goals Met:  Queuing for purse lip breathing No report of cardiac concerns or symptoms Strength training completed today  Goals Unmet:  O2 Sat. Patient has significant desaturation with rest break during walking track. Sats remain at 86 while ambulation but drops to 79 on 4 liters when he stops.  Comments: Service time is from 1030 to 1220   Dr. Rush Farmer is Medical Director for Pulmonary Rehab at [facility].

## 2017-06-19 ENCOUNTER — Encounter (HOSPITAL_COMMUNITY)
Admission: RE | Admit: 2017-06-19 | Discharge: 2017-06-19 | Disposition: A | Discharge status: Home / Self Care | Payer: PPO | Source: Ambulatory Visit | Attending: Emergency Medicine | Admitting: Emergency Medicine

## 2017-06-19 VITALS — Wt 187.4 lb

## 2017-06-19 DIAGNOSIS — J439 Emphysema, unspecified: Secondary | ICD-10-CM | POA: Diagnosis not present

## 2017-06-19 DIAGNOSIS — Z7951 Long term (current) use of inhaled steroids: Secondary | ICD-10-CM | POA: Diagnosis not present

## 2017-06-19 DIAGNOSIS — Z7982 Long term (current) use of aspirin: Secondary | ICD-10-CM | POA: Insufficient documentation

## 2017-06-19 DIAGNOSIS — Z87891 Personal history of nicotine dependence: Secondary | ICD-10-CM | POA: Diagnosis not present

## 2017-06-19 DIAGNOSIS — Z79899 Other long term (current) drug therapy: Secondary | ICD-10-CM | POA: Diagnosis not present

## 2017-06-19 DIAGNOSIS — I129 Hypertensive chronic kidney disease with stage 1 through stage 4 chronic kidney disease, or unspecified chronic kidney disease: Secondary | ICD-10-CM | POA: Diagnosis not present

## 2017-06-19 DIAGNOSIS — M109 Gout, unspecified: Secondary | ICD-10-CM | POA: Insufficient documentation

## 2017-06-19 DIAGNOSIS — K219 Gastro-esophageal reflux disease without esophagitis: Secondary | ICD-10-CM | POA: Diagnosis not present

## 2017-06-19 DIAGNOSIS — N189 Chronic kidney disease, unspecified: Secondary | ICD-10-CM | POA: Diagnosis not present

## 2017-06-19 DIAGNOSIS — E785 Hyperlipidemia, unspecified: Secondary | ICD-10-CM | POA: Diagnosis not present

## 2017-06-19 NOTE — Progress Notes (Signed)
Daily Session Note  Patient Details  Name: BROCKTON MCKESSON MRN: [id number] Date of Birth: 13-Aug-1936 Referring Provider:     Pulmonary Rehab Walk Test from 05/31/2017 in [place]  Referring Provider  Dr. Lamonte Sakai      Encounter Date: 06/19/2017  Check In:     Session Check In - 06/19/17 1216      Check-In   [facility]   Staff Present Su Hilt, MS, ACSM RCEP, Exercise Physiologist;Lisa Ysidro Evert, RN;Other;Runa Whittingham Rollene Rotunda, RN, BSN   Supervising physician immediately available to respond to emergencies [facility] immediately available   Physician(s) Dr. Bonner Puna   Medication changes reported     No   Fall or balance concerns reported    No   Tobacco Cessation No Change   Warm-up and Cool-down Performed as group-led instruction   Resistance Training Performed Yes   VAD Patient? No     Pain Assessment   Currently in Pain? No/denies   Multiple Pain Sites No      Capillary Blood Glucose: No results found for this or any previous visit (from the past 24 hour(s)).      Exercise Prescription Changes - 06/19/17 1224      Response to Exercise   Blood Pressure (Admit) 104/68   Blood Pressure (Exercise) 110/60   Blood Pressure (Exit) 112/70   Heart Rate (Admit) 109 bpm   Heart Rate (Exercise) 119 bpm   Heart Rate (Exit) 104 bpm   Oxygen Saturation (Admit) 91 %   Oxygen Saturation (Exercise) 89 %   Oxygen Saturation (Exit) 99 %   Rating of Perceived Exertion (Exercise) 15   Perceived Dyspnea (Exercise) 3   Duration Progress to 45 minutes of aerobic exercise without signs/symptoms of physical distress   Intensity Other (comment)  40-80% of HRR     Progression   Progression Continue to progress workloads to maintain intensity without signs/symptoms of physical distress.     Resistance Training   Training Prescription Yes   Weight blue bands   Reps 10-15   Time 10 Minutes     Oxygen   Oxygen Continuous   Liters 4     NuStep   Level 3   Minutes 17   METs 2.4     Arm Ergometer   Level 2   Minutes 17     Track   Laps 8   Minutes 17      History  Smoking Status  . Former Smoker  . Packs/day: 3.00  . Years: 30.00  . Types: Cigarettes  . Quit date: 09/03/1989  Smokeless Tobacco  . Never Used    Goals Met:  Exercise tolerated well No report of cardiac concerns or symptoms Strength training completed today  Goals Unmet:  Not Applicable  Comments: Service time is from 1030 to 1205   Dr. Rush Farmer is Medical Director for Pulmonary Rehab at [facility].

## 2017-06-21 ENCOUNTER — Encounter (HOSPITAL_COMMUNITY)
Admission: RE | Admit: 2017-06-21 | Discharge: 2017-06-21 | Disposition: A | Discharge status: Home / Self Care | Payer: PPO | Source: Ambulatory Visit | Attending: Emergency Medicine | Admitting: Emergency Medicine

## 2017-06-21 VITALS — Wt 186.5 lb

## 2017-06-21 DIAGNOSIS — J439 Emphysema, unspecified: Secondary | ICD-10-CM

## 2017-06-21 NOTE — Progress Notes (Signed)
Daily Session Note  Patient Details  Name: Charles Archer MRN: [id number] Date of Birth: 01-08-1936 Referring Provider:     Pulmonary Rehab Walk Test from 05/31/2017 in [place]  Referring Provider  Dr. Lamonte Sakai      Encounter Date: 06/21/2017  Check In:     Session Check In - 06/21/17 1204      Check-In   [facility]   Staff Present Su Hilt, MS, ACSM RCEP, Exercise Physiologist;Jayceion Lisenby Ysidro Evert, RN;Portia Rollene Rotunda, RN, BSN   Supervising physician immediately available to respond to emergencies [facility] immediately available   Physician(s) Dr. Bonner Puna   Medication changes reported     No   Fall or balance concerns reported    No   Tobacco Cessation No Change   Warm-up and Cool-down Performed as group-led instruction   Resistance Training Performed Yes   VAD Patient? No     Pain Assessment   Currently in Pain? No/denies   Multiple Pain Sites No      Capillary Blood Glucose: No results found for this or any previous visit (from the past 24 hour(s)).      Exercise Prescription Changes - 06/21/17 1200      Response to Exercise   Blood Pressure (Admit) 112/60   Blood Pressure (Exercise) 116/50   Blood Pressure (Exit) 96/60   Heart Rate (Admit) 98 bpm   Heart Rate (Exercise) 102 bpm   Heart Rate (Exit) 83 bpm   Oxygen Saturation (Admit) 95 %   Oxygen Saturation (Exercise) 90 %   Oxygen Saturation (Exit) 100 %   Rating of Perceived Exertion (Exercise) 13   Perceived Dyspnea (Exercise) 2   Duration Progress to 45 minutes of aerobic exercise without signs/symptoms of physical distress   Intensity THRR unchanged     Progression   Progression Continue to progress workloads to maintain intensity without signs/symptoms of physical distress.     Resistance Training   Training Prescription Yes   Weight blue bands   Reps 10-15   Time 10 Minutes     Oxygen   Oxygen Continuous   Liters 4-6     NuStep   Level 3   Minutes 17   METs 2.7     Arm Ergometer   Level 3   Minutes 17      History  Smoking Status  . Former Smoker  . Packs/day: 3.00  . Years: 30.00  . Types: Cigarettes  . Quit date: 09/03/1989  Smokeless Tobacco  . Never Used    Goals Met:  Exercise tolerated well No report of cardiac concerns or symptoms Strength training completed today  Goals Unmet:  Not Applicable  Comments: Service time is from 1030 to 1200    Dr. Rush Farmer is Medical Director for Pulmonary Rehab at [facility].

## 2017-06-23 DIAGNOSIS — J449 Chronic obstructive pulmonary disease, unspecified: Secondary | ICD-10-CM | POA: Diagnosis not present

## 2017-06-26 ENCOUNTER — Encounter (HOSPITAL_COMMUNITY)
Admission: RE | Admit: 2017-06-26 | Discharge: 2017-06-26 | Disposition: A | Discharge status: Home / Self Care | Payer: PPO | Source: Ambulatory Visit | Attending: Emergency Medicine | Admitting: Emergency Medicine

## 2017-06-26 VITALS — Wt 182.5 lb

## 2017-06-26 DIAGNOSIS — J439 Emphysema, unspecified: Secondary | ICD-10-CM | POA: Diagnosis not present

## 2017-06-26 NOTE — Progress Notes (Signed)
Daily Session Note  Patient Details  Name: Charles Archer MRN: [id number] Date of Birth: 1935/11/05 Referring Provider:     Pulmonary Rehab Walk Test from 05/31/2017 in [place]  Referring Provider  Dr. Lamonte Sakai      Encounter Date: 06/26/2017  Check In:     Session Check In - 06/26/17 1231      Check-In   [facility]   Staff Present Su Hilt, MS, ACSM RCEP, Exercise Physiologist;Maria Whitaker, RN, BSN;Joann Rion, RN, Luisa Hart, RN, BSN   Supervising physician immediately available to respond to emergencies [facility] immediately available   Physician(s) Dr. Bonner Puna   Medication changes reported     No   Fall or balance concerns reported    No   Tobacco Cessation No Change   Warm-up and Cool-down Performed as group-led instruction   Resistance Training Performed Yes   VAD Patient? No     Pain Assessment   Currently in Pain? No/denies   Multiple Pain Sites No      Capillary Blood Glucose: No results found for this or any previous visit (from the past 24 hour(s)).      Exercise Prescription Changes - 06/26/17 1200      Response to Exercise   Blood Pressure (Admit) 110/60   Blood Pressure (Exercise) 122/64   Blood Pressure (Exit) 102/60   Heart Rate (Admit) 101 bpm   Heart Rate (Exercise) 106 bpm   Heart Rate (Exit) 85 bpm   Oxygen Saturation (Admit) 93 %   Oxygen Saturation (Exercise) 83 %   Oxygen Saturation (Exit) 98 %   Rating of Perceived Exertion (Exercise) 13   Perceived Dyspnea (Exercise) 2   Duration Progress to 45 minutes of aerobic exercise without signs/symptoms of physical distress   Intensity THRR unchanged     Progression   Progression Continue to progress workloads to maintain intensity without signs/symptoms of physical distress.     Resistance Training   Training Prescription Yes   Weight blue bands   Reps 10-15   Time 10 Minutes     Oxygen   Oxygen Continuous    Liters 4-6     NuStep   Level 3   Minutes 17     Track   Laps 7   Minutes 17      History  Smoking Status  . Former Smoker  . Packs/day: 3.00  . Years: 30.00  . Types: Cigarettes  . Quit date: 09/03/1989  Smokeless Tobacco  . Never Used    Goals Met:  Exercise tolerated well No report of cardiac concerns or symptoms Strength training completed today  Goals Unmet:  Not Applicable  Comments: Service time is from 10:30a to 12:30p    Dr. Rush Farmer is Medical Director for Pulmonary Rehab at [facility].

## 2017-06-27 ENCOUNTER — Encounter: Payer: Self-pay | Admitting: Emergency Medicine

## 2017-06-28 ENCOUNTER — Encounter (HOSPITAL_COMMUNITY)
Admission: RE | Admit: 2017-06-28 | Discharge: 2017-06-28 | Disposition: A | Discharge status: Home / Self Care | Payer: PPO | Source: Ambulatory Visit | Attending: Emergency Medicine | Admitting: Emergency Medicine

## 2017-06-28 VITALS — Wt 186.7 lb

## 2017-06-28 DIAGNOSIS — J449 Chronic obstructive pulmonary disease, unspecified: Secondary | ICD-10-CM | POA: Diagnosis not present

## 2017-06-28 DIAGNOSIS — J439 Emphysema, unspecified: Secondary | ICD-10-CM | POA: Diagnosis not present

## 2017-06-28 NOTE — Progress Notes (Signed)
Daily Session Note  Patient Details  Name: Charles Archer MRN: [id number] Date of Birth: 1935/11/21 Referring Provider:     Pulmonary Rehab Walk Test from 05/31/2017 in [place]  Referring Provider  Dr. Lamonte Sakai      Encounter Date: 06/28/2017  Check In:     Session Check In - 06/28/17 1202      Check-In   [facility]   Staff Present Su Hilt, MS, ACSM RCEP, Exercise Physiologist;Portia Rollene Rotunda, RN, BSN   Supervising physician immediately available to respond to emergencies [facility] immediately available   Physician(s) Dr. Carolin Sicks   Medication changes reported     No   Fall or balance concerns reported    No   Tobacco Cessation No Change   Warm-up and Cool-down Performed as group-led instruction   Resistance Training Performed Yes   VAD Patient? No     Pain Assessment   Currently in Pain? No/denies   Multiple Pain Sites No      Capillary Blood Glucose: No results found for this or any previous visit (from the past 24 hour(s)).      Exercise Prescription Changes - 06/28/17 1200      Response to Exercise   Blood Pressure (Admit) 120/64   Blood Pressure (Exercise) 128/70   Blood Pressure (Exit) 102/62   Heart Rate (Admit) 108 bpm   Heart Rate (Exercise) 118 bpm   Heart Rate (Exit) 85 bpm   Oxygen Saturation (Admit) 90 %   Oxygen Saturation (Exercise) 83 %  4 liters increased to 8 liters   Oxygen Saturation (Exit) 100 %   Rating of Perceived Exertion (Exercise) 13   Perceived Dyspnea (Exercise) 3   Duration Progress to 45 minutes of aerobic exercise without signs/symptoms of physical distress   Intensity THRR unchanged     Progression   Progression Continue to progress workloads to maintain intensity without signs/symptoms of physical distress.     Resistance Training   Training Prescription Yes   Weight blue bands   Reps 10-15   Time 10 Minutes     Oxygen   Oxygen Continuous   Liters 4-6     NuStep   Level 4   Minutes 17   METs 2.4     Arm Ergometer   Level 3   Minutes 17     Track   Laps 5   Minutes 17      History  Smoking Status  . Former Smoker  . Packs/day: 3.00  . Years: 30.00  . Types: Cigarettes  . Quit date: 09/03/1989  Smokeless Tobacco  . Never Used    Goals Met:  Exercise tolerated well No report of cardiac concerns or symptoms Strength training completed today  Goals Unmet:  Not Applicable  Comments: Service time is from 10:30a to 12:00p    Dr. Rush Farmer is Medical Director for Pulmonary Rehab at [facility].

## 2017-06-28 NOTE — Progress Notes (Signed)
Charles Archer 81 y.o. male   DOB: 01/17/1936 MRN: [id number]          Nutrition Note Dx: Pulmonary Emphysema Past Medical History:  Diagnosis Date  . Abnormal stress test    s/p cardiac cath 10/2013 - nonobstructive CAD with normal LV function  . CKD (chronic kidney disease)   . COPD (chronic obstructive pulmonary disease) (HCC)   . Dyspnea on exertion   . Emphysema lung (HCC)   . GERD (gastroesophageal reflux disease)   . Gout   . HTN (hypertension)   . Hyperlipidemia    Meds reviewed. Ht: Ht Readings from Last 1 Encounters:  05/28/17 5' 11" (1.803 m)    Wt:  Wt Readings from Last 3 Encounters:  06/26/17 182 lb 8.7 oz (82.8 kg)  06/21/17 186 lb 8.2 oz (84.6 kg)  06/19/17 187 lb 6.3 oz (85 kg)    BMI: 25.5    Current tobacco use? No  Labs:  Lipid Panel  No results found for: CHOL, TRIG, HDL, CHOLHDL, VLDL, LDLCALC, LDLDIRECT  No results found for: HGBA1C  Nutrition Note Spoke with pt. Pt is overweight. Pt reports losing 15 lb over 6 months due to pna. Pt wants to maintain his current wt. There are some ways the pt can make his eating habits healthier. Pt's Rate Your Plate results reviewed with pt. Pt tries to avoid most salty food; uses very few canned/ convenience foods.  Pt adds salt to food occasionally.  The role of sodium in lung disease reviewed with pt. Pt expressed understanding of the information reviewed via feedback method.    Nutrition Diagnosis ? Food-and nutrition-related knowledge deficit related to lack of exposure to information as related to diagnosis of pulmonary disease  Nutrition Intervention ? Pt's individual nutrition plan and goals reviewed with pt. ? Benefits of adopting healthy eating habits discussed when pt's Rate Your Plate reviewed.  Goal(s) 1. Describe the benefit of including fruits, vegetables, whole grains, and low-fat dairy products in a healthy meal plan.  Plan:  Pt to attend Pulmonary Nutrition class - met 06/07/17 Will provide  client-centered nutrition education as part of interdisciplinary care.   Monitor and evaluate progress toward nutrition goal with team.  Monitor and Evaluate progress toward nutrition goal with team.   Charles Archer, M.Ed, RD, LDN, CDE 06/28/2017 12:00 PM 

## 2017-07-03 ENCOUNTER — Encounter (HOSPITAL_COMMUNITY)
Admission: RE | Admit: 2017-07-03 | Discharge: 2017-07-03 | Disposition: A | Discharge status: Home / Self Care | Payer: PPO | Source: Ambulatory Visit | Attending: Emergency Medicine | Admitting: Emergency Medicine

## 2017-07-03 ENCOUNTER — Telehealth: Payer: Self-pay | Admitting: Emergency Medicine

## 2017-07-03 VITALS — Wt 185.8 lb

## 2017-07-03 DIAGNOSIS — J449 Chronic obstructive pulmonary disease, unspecified: Secondary | ICD-10-CM

## 2017-07-03 DIAGNOSIS — J439 Emphysema, unspecified: Secondary | ICD-10-CM

## 2017-07-03 NOTE — Telephone Encounter (Signed)
[facility] but they are closed for today. I needed to clarify message. Will call in the AM.

## 2017-07-03 NOTE — Progress Notes (Signed)
Daily Session Note  Patient Details  Name: Charles Archer MRN: [id number] Date of Birth: [birth year] Referring Provider:     Pulmonary Rehab Walk Test from 05/31/2017 in [place]  Referring Provider  Dr. Lamonte Sakai      Encounter Date: 07/03/2017  Check In:     Session Check In - 07/03/17 1030      Check-In   [facility]   Staff Present Rodney Langton, RN;Molly diVincenzo, MS, ACSM RCEP, Exercise Physiologist;Fintan Grater Rollene Rotunda, RN, BSN   Supervising physician immediately available to respond to emergencies [facility] immediately available   Physician(s) Dr. Carolin Sicks   Medication changes reported     No   Fall or balance concerns reported    No   Tobacco Cessation No Change   Warm-up and Cool-down Performed as group-led instruction   Resistance Training Performed Yes   VAD Patient? No     Pain Assessment   Currently in Pain? No/denies   Multiple Pain Sites No      Capillary Blood Glucose: No results found for this or any previous visit (from the past 24 hour(s)).      Exercise Prescription Changes - 07/03/17 1632      Response to Exercise   Blood Pressure (Admit) 120/62   Blood Pressure (Exercise) 118/80   Blood Pressure (Exit) 100/60   Heart Rate (Admit) 103 bpm   Heart Rate (Exercise) 116 bpm   Heart Rate (Exit) 95 bpm   Oxygen Saturation (Admit) 95 %   Oxygen Saturation (Exercise) 85 %  desaturated on 10L   Oxygen Saturation (Exit) 85 %   Rating of Perceived Exertion (Exercise) 13   Perceived Dyspnea (Exercise) 3   Duration Progress to 45 minutes of aerobic exercise without signs/symptoms of physical distress   Intensity THRR unchanged     Progression   Progression Continue to progress workloads to maintain intensity without signs/symptoms of physical distress.     Resistance Training   Training Prescription Yes   Weight blue bands   Reps 10-15   Time 10 Minutes     Oxygen   Oxygen Continuous   Liters 10     NuStep   Level 4   Minutes 17   METs 2.8     Arm Ergometer   Level 3   Minutes 17     Track   Laps 6   Minutes 17      History  Smoking Status  . Former Smoker  . Packs/day: 3.00  . Years: 30.00  . Types: Cigarettes  . Quit date: 09/03/1989  Smokeless Tobacco  . Never Used    Goals Met:  Exercise tolerated well Queuing for purse lip breathing No report of cardiac concerns or symptoms Strength training completed today  Goals Unmet:  Not Applicable  Comments: Service time is from 1030 to 1200   Dr. Rush Farmer is Medical Director for Pulmonary Rehab at [facility].

## 2017-07-04 NOTE — Telephone Encounter (Signed)
Attempted to call Charles Archer but pulmonary rehab is closed on Wednesday.  Will call back tomorrow

## 2017-07-05 ENCOUNTER — Encounter (HOSPITAL_COMMUNITY)
Admission: RE | Admit: 2017-07-05 | Discharge: 2017-07-05 | Disposition: A | Discharge status: Home / Self Care | Payer: PPO | Source: Ambulatory Visit | Attending: Emergency Medicine | Admitting: Emergency Medicine

## 2017-07-05 VITALS — Wt 185.6 lb

## 2017-07-05 DIAGNOSIS — J439 Emphysema, unspecified: Secondary | ICD-10-CM

## 2017-07-05 NOTE — Progress Notes (Signed)
Daily Session Note  Patient Details  Name: Charles Archer MRN: [id number] Date of Birth: 05-06-1936 Referring Provider:     Pulmonary Rehab Walk Test from 05/31/2017 in [place]  Referring Provider  Dr. Lamonte Sakai      Encounter Date: 07/05/2017  Check In:     Session Check In - 07/05/17 1057      Check-In   [facility]   Staff Present Su Hilt, MS, ACSM RCEP, Exercise Physiologist;Portia Rollene Rotunda, RN, BSN   Supervising physician immediately available to respond to emergencies [facility] immediately available   Physician(s) Dr. Wynelle Cleveland   Medication changes reported     No   Fall or balance concerns reported    No   Tobacco Cessation No Change   Warm-up and Cool-down Performed as group-led instruction   Resistance Training Performed Yes   VAD Patient? No     Pain Assessment   Currently in Pain? No/denies   Multiple Pain Sites No      Capillary Blood Glucose: No results found for this or any previous visit (from the past 24 hour(s)).      Exercise Prescription Changes - 07/05/17 1200      Response to Exercise   Blood Pressure (Admit) 120/64   Blood Pressure (Exercise) 120/80   Blood Pressure (Exit) 106/64   Heart Rate (Admit) 88 bpm   Heart Rate (Exercise) 110 bpm   Heart Rate (Exit) 85 bpm   Oxygen Saturation (Admit) 97 %   Oxygen Saturation (Exercise) 87 %  desaturated on 10L   Oxygen Saturation (Exit) 97 %   Rating of Perceived Exertion (Exercise) 13   Perceived Dyspnea (Exercise) 4   Duration Progress to 45 minutes of aerobic exercise without signs/symptoms of physical distress   Intensity THRR unchanged     Progression   Progression Continue to progress workloads to maintain intensity without signs/symptoms of physical distress.     Resistance Training   Training Prescription Yes   Weight blue bands   Reps 10-15   Time 10 Minutes     Oxygen   Oxygen Continuous   Liters 10     NuStep   Level 4   Minutes 17   METs 2.6     Track   Laps 6   Minutes 17      History  Smoking Status  . Former Smoker  . Packs/day: 3.00  . Years: 30.00  . Types: Cigarettes  . Quit date: 09/03/1989  Smokeless Tobacco  . Never Used    Goals Met:  Exercise tolerated well No report of cardiac concerns or symptoms Strength training completed today  Goals Unmet:  Not Applicable  Comments: Service time is from 10:30a to 12:20p    Dr. Rush Farmer is Medical Director for Pulmonary Rehab at [facility]

## 2017-07-06 NOTE — Telephone Encounter (Signed)
[facility] wanted to see if you would be willing to place an order for the oxymizer for the pt. Please advise. Thanks

## 2017-07-06 NOTE — Progress Notes (Signed)
Pulmonary Individual Treatment Plan  Patient Details  Name: Charles Archer MRN: [id number] Date of Birth: November 01, 1935 Referring Provider:     Pulmonary Rehab Walk Test from 05/31/2017 in [facility]  Referring Provider  Dr. Lamonte Sakai      Initial Encounter Date:    Pulmonary Rehab Walk Test from 05/31/2017 in [facility]  Date  05/31/17  Referring Provider  Dr. Lamonte Sakai      Visit Diagnosis: Pulmonary emphysema, unspecified emphysema type ([place])  Patient's Home Medications on Admission:   Current Outpatient Prescriptions:  .  acetaminophen (TYLENOL ARTHRITIS PAIN) 650 MG CR tablet, Take 650 mg by mouth every 8 (eight) hours as needed for pain., Disp: , Rfl:  .  ADVAIR DISKUS 250-50 MCG/DOSE AEPB, INHALE ONE PUFF BY MOUTH TWICE A DAY (Patient not taking: Reported on 05/28/2017), Disp: 60 each, Rfl: 4 .  albuterol (PROAIR HFA) 108 (90 Base) MCG/ACT inhaler, Inhale 2 puffs into the lungs every 6 (six) hours as needed for wheezing or shortness of breath., Disp: 1 Inhaler, Rfl: 5 .  allopurinol (ZYLOPRIM) 100 MG tablet, Take 100 mg by mouth daily., Disp: , Rfl:  .  aspirin 81 MG tablet, Take 81 mg by mouth daily., Disp: , Rfl:  .  B Complex-Folic Acid (SUPER B COMPLEX MAXI PO), Take 1 tablet by mouth daily. , Disp: , Rfl:  .  calcium carbonate (OS-CAL) 600 MG TABS tablet, Take 600 mg by mouth daily., Disp: , Rfl:  .  Cinnamon 500 MG capsule, Take 1,000 mg by mouth daily., Disp: , Rfl:  .  docusate sodium (COLACE) 50 MG capsule, Take 50 mg by mouth daily as needed for mild constipation. , Disp: , Rfl:  .  fluorouracil (EFUDEX) 5 % cream, Apply 5 % topically 2 (two) times daily as needed. , Disp: , Rfl:  .  fluticasone (FLONASE) 50 MCG/ACT nasal spray, PLACE 2 SPRAYS INTO BOTH NOSTRILS DAILY., Disp: 16 g, Rfl: 5 .  Ginkgo Biloba 40 MG TABS, Take 40 mg by mouth daily. , Disp: , Rfl:  .  ipratropium-albuterol (DUONEB) 0.5-2.5 (3) MG/3ML SOLN,  Take 3 mLs by nebulization 4 (four) times daily., Disp: 360 mL, Rfl: 5 .  irbesartan (AVAPRO) 300 MG tablet, Take 1 tablet by mouth daily., Disp: , Rfl:  .  Misc Natural Products (OSTEO BI-FLEX ADV JOINT SHIELD PO), Take 1,500 mg by mouth daily., Disp: , Rfl:  .  Multiple Vitamin (MULTIVITAMIN) capsule, Take 1 capsule by mouth daily., Disp: , Rfl:  .  Naproxen Sodium (ALEVE PO), Take 220 mg by mouth as needed (for pain). , Disp: , Rfl:  .  niacin 250 MG tablet, Take 250 mg by mouth at bedtime., Disp: , Rfl:  .  sildenafil (REVATIO) 20 MG tablet, Take 1 tablet by mouth daily., Disp: , Rfl:  .  tamsulosin (FLOMAX) 0.4 MG CAPS capsule, Take 1 capsule by mouth daily., Disp: , Rfl:  .  vitamin B-12 (CYANOCOBALAMIN) 1000 MCG tablet, Take 1,000 mcg by mouth daily., Disp: , Rfl:  .  zinc gluconate 50 MG tablet, Take 50 mg by mouth daily., Disp: , Rfl:   Past Medical History: Past Medical History:  Diagnosis Date  . Abnormal stress test    s/p cardiac cath 10/2013 - nonobstructive CAD with normal LV function  . CKD (chronic kidney disease)   . COPD (chronic obstructive pulmonary disease) ([place])   . Dyspnea on exertion   . Emphysema lung (  HCC)   . GERD (gastroesophageal reflux disease)   . Gout   . HTN (hypertension)   . Hyperlipidemia     Tobacco Use: History  Smoking Status  . Former Smoker  . Packs/day: 3.00  . Years: 30.00  . Types: Cigarettes  . Quit date: 09/03/1989  Smokeless Tobacco  . Never Used    Labs: Recent Review Flowsheet Data    There is no flowsheet data to display.      Capillary Blood Glucose: No results found for: GLUCAP   Pulmonary Assessment Scores:     Pulmonary Assessment Scores    Row Name 05/31/17 0901 05/31/17 1627       ADL UCSD   ADL Phase Entry Entry    SOB Score total 93  -      CAT Score   CAT Score 23  Entry  -      mMRC Score   mMRC Score  - 2       Pulmonary Function Assessment:     Pulmonary Function Assessment - 05/28/17  1502      Breath   Bilateral Breath Sounds Decreased   Shortness of Breath Yes;Limiting activity      Exercise Target Goals:    Exercise Program Goal: Individual exercise prescription set with THRR, safety & activity barriers. Participant demonstrates ability to understand and report RPE using BORG scale, to self-measure pulse accurately, and to acknowledge the importance of the exercise prescription.  Exercise Prescription Goal: Starting with aerobic activity 30 plus minutes a day, 3 days per week for initial exercise prescription. Provide home exercise prescription and guidelines that participant acknowledges understanding prior to discharge.  Activity Barriers & Risk Stratification:     Activity Barriers & Cardiac Risk Stratification - 05/28/17 1447      Activity Barriers & Cardiac Risk Stratification   Activity Barriers Deconditioning;Shortness of Breath      6 Minute Walk:     6 Minute Walk    Row Name 05/31/17 1622         6 Minute Walk   Phase Initial     Distance 400 feet     Walk Time -  2 minutes 55 seconds     # of Rest Breaks -  2 (3 minutes 5 seconds total)     MPH 0.75     METS 1.09     RPE 11     Perceived Dyspnea  1     Symptoms Yes (comment)     Comments WHEELCHAIR     Resting HR 107 bpm     Resting BP 125/83     Max Ex. HR 118 bpm     Max Ex. BP 135/78       Interval HR   Baseline HR (retired) 107     1 Minute HR 116     2 Minute HR 116     3 Minute HR 111     4 Minute HR 118     5 Minute HR 116     6 Minute HR 113     2 Minute Post HR 103     Interval Heart Rate? Yes       Interval Oxygen   Interval Oxygen? Yes     Baseline Oxygen Saturation % 91 %     Resting Liters of Oxygen 4 L     1 Minute Oxygen Saturation % 81 %     1 Minute Liters of Oxygen 4 L  2 Minute Oxygen Saturation % 82 %     2 Minute Liters of Oxygen 4 L     3 Minute Oxygen Saturation % 88 %     3 Minute Liters of Oxygen 4 L     4 Minute Oxygen Saturation %  85 %     4 Minute Liters of Oxygen 4 L     5 Minute Oxygen Saturation % 81 %     5 Minute Liters of Oxygen 4 L     6 Minute Oxygen Saturation % 87 %     6 Minute Liters of Oxygen 4 L     2 Minute Post Oxygen Saturation % 91 %     2 Minute Post Liters of Oxygen 4 L        Oxygen Initial Assessment:     Oxygen Initial Assessment - 05/31/17 1611      Initial 6 min Walk   Oxygen Used Continuous;E-Tanks   Liters per minute 4   Resting Oxygen Saturation  91 %   Exercise Oxygen Saturation  during 6 min walk 81 %     Program Oxygen Prescription   Program Oxygen Prescription Continuous   Liters per minute --  unsure since emphysema will contact Dr. Lamonte Sakai for further guidance      Oxygen Re-Evaluation:     Oxygen Re-Evaluation    Row Name 06/13/17 1816 07/06/17 1219           Program Oxygen Prescription   Program Oxygen Prescription Continuous Continuous      Liters per minute 4 10      Comments he does desaturate on 4 liters and requires rest breaks while walking the track. will consult with MD regarding intolerance with ambulation on 4 liters  -        Home Oxygen   Home Oxygen Device Home Concentrator;E-Tanks Home Concentrator;E-Tanks      Sleep Oxygen Prescription Continuous Continuous      Liters per minute 3 3      Home Exercise Oxygen Prescription Pulsed  patient only has pulsed regulator on portable tank Pulsed  this is not adequate for home exercise and will work with MD to make sure patient has sufficient oxygen to exercise.      Liters per minute 4  -      Home at Rest Exercise Oxygen Prescription Continuous Continuous      Compliance with Home Oxygen Use Yes Yes        Goals/Expected Outcomes   Short Term Goals To learn and exhibit compliance with exercise, home and travel O2 prescription;To learn and understand importance of monitoring SPO2 with pulse oximeter and demonstrate accurate use of the pulse oximeter.;To learn and understand importance of  maintaining oxygen saturations>88%;To learn and demonstrate proper pursed lip breathing techniques or other breathing techniques.;To learn and demonstrate proper use of respiratory medications To learn and exhibit compliance with exercise, home and travel O2 prescription;To learn and understand importance of monitoring SPO2 with pulse oximeter and demonstrate accurate use of the pulse oximeter.;To learn and understand importance of maintaining oxygen saturations>88%;To learn and demonstrate proper pursed lip breathing techniques or other breathing techniques.;To learn and demonstrate proper use of respiratory medications      Long  Term Goals Exhibits compliance with exercise, home and travel O2 prescription;Verbalizes importance of monitoring SPO2 with pulse oximeter and return demonstration;Maintenance of O2 saturations>88%;Compliance with respiratory medication;Exhibits proper breathing techniques, such as pursed lip breathing or other method taught during  program session  -      Comments  - patient is compliant with home o2 prescription      Goals/Expected Outcomes  - see above goals         Oxygen Discharge (Final Oxygen Re-Evaluation):     Oxygen Re-Evaluation - 07/06/17 1219      Program Oxygen Prescription   Program Oxygen Prescription Continuous   Liters per minute 10     Home Oxygen   Home Oxygen Device Home Concentrator;E-Tanks   Sleep Oxygen Prescription Continuous   Liters per minute 3   Home Exercise Oxygen Prescription Pulsed  this is not adequate for home exercise and will work with MD to make sure patient has sufficient oxygen to exercise.   Home at Rest Exercise Oxygen Prescription Continuous   Compliance with Home Oxygen Use Yes     Goals/Expected Outcomes   Short Term Goals To learn and exhibit compliance with exercise, home and travel O2 prescription;To learn and understand importance of monitoring SPO2 with pulse oximeter and demonstrate accurate use of the pulse  oximeter.;To learn and understand importance of maintaining oxygen saturations>88%;To learn and demonstrate proper pursed lip breathing techniques or other breathing techniques.;To learn and demonstrate proper use of respiratory medications   Comments patient is compliant with home o2 prescription   Goals/Expected Outcomes see above goals      Initial Exercise Prescription:     Initial Exercise Prescription - 05/31/17 1600      Date of Initial Exercise RX and Referring Provider   Date 05/31/17   Referring Provider Dr. Lamonte Sakai     Oxygen   Oxygen Continuous   Liters 4  4     NuStep   Level 1   Minutes 34   METs 1.2     Arm Ergometer   Level 1   Minutes 17     Prescription Details   Frequency (times per week) 2   Duration Progress to 45 minutes of aerobic exercise without signs/symptoms of physical distress     Intensity   THRR 40-80% of Max Heartrate 56-113   Ratings of Perceived Exertion 11-13   Perceived Dyspnea 0-4     Resistance Training   Training Prescription Yes   Weight blue bands   Reps 10-15      Perform Capillary Blood Glucose checks as needed.  Exercise Prescription Changes:     Exercise Prescription Changes    Row Name 06/07/17 1200 06/12/17 1200 06/14/17 1256 06/19/17 1224 06/21/17 1200     Response to Exercise   Blood Pressure (Admit) 142/70 112/60 110/62 104/68 112/60   Blood Pressure (Exercise) 126/62 138/62 140/80 110/60 116/50   Blood Pressure (Exit) 102/62 106/60 114/62 112/70 96/60   Heart Rate (Admit) 102 bpm 95 bpm 83 bpm 109 bpm 98 bpm   Heart Rate (Exercise) 107 bpm 100 bpm 106 bpm 119 bpm 102 bpm   Heart Rate (Exit) 84 bpm 80 bpm 85 bpm 104 bpm 83 bpm   Oxygen Saturation (Admit) 98 % 95 % 97 % 91 % 95 %   Oxygen Saturation (Exercise) 89 % 93 % 79 % 89 % 90 %   Oxygen Saturation (Exit) 97 % 98 % 98 % 99 % 100 %   Rating of Perceived Exertion (Exercise) 13 12 15 15 13    Perceived Dyspnea (Exercise) 2 1 4 3 2    Duration Progress to  45 minutes of aerobic exercise without signs/symptoms of physical distress Progress to 45 minutes of aerobic  exercise without signs/symptoms of physical distress Progress to 45 minutes of aerobic exercise without signs/symptoms of physical distress Progress to 45 minutes of aerobic exercise without signs/symptoms of physical distress Progress to 45 minutes of aerobic exercise without signs/symptoms of physical distress   Intensity Other (comment)  40-80% of HRR Other (comment)  40-80% of HRR Other (comment)  40-80% of HRR Other (comment)  40-80% of HRR THRR unchanged     Progression   Progression Continue to progress workloads to maintain intensity without signs/symptoms of physical distress. Continue to progress workloads to maintain intensity without signs/symptoms of physical distress. Continue to progress workloads to maintain intensity without signs/symptoms of physical distress. Continue to progress workloads to maintain intensity without signs/symptoms of physical distress. Continue to progress workloads to maintain intensity without signs/symptoms of physical distress.     Resistance Training   Training Prescription Yes Yes Yes Yes Yes   Weight blue bands blue bands blue bands blue bands blue bands   Reps 10-15 10-15 10-15 10-15 10-15   Time 10 Minutes 10 Minutes 10 Minutes 10 Minutes 10 Minutes     Oxygen   Oxygen Continuous Continuous Continuous Continuous Continuous   Liters 4 4 4 4  4-6     NuStep   Level 2 2 2 3 3    Minutes 17 34 17 17 17    METs 2.1 2 2.1 2.4 2.7     Arm Ergometer   Level 1 2  - 2 3   Minutes 17 17  - 17 [place]  -  - 5 8  -   Minutes  -  - 17 17  -   Row Name 06/26/17 1200 06/28/17 1200 07/03/17 1632 07/05/17 1200       Response to Exercise   Blood Pressure (Admit) 110/60 120/64 120/62 120/64    Blood Pressure (Exercise) 122/64 128/70 118/80 120/80    Blood Pressure (Exit) 102/60 102/62 100/60 106/64    Heart Rate (Admit) 101 bpm 108  bpm 103 bpm 88 bpm    Heart Rate (Exercise) 106 bpm 118 bpm 116 bpm 110 bpm    Heart Rate (Exit) 85 bpm 85 bpm 95 bpm 85 bpm    Oxygen Saturation (Admit) 93 % 90 % 95 % 97 %    Oxygen Saturation (Exercise) 83 % 83 %  4 liters increased to 8 liters 85 %  desaturated on 10L 87 %  desaturated on 10L    Oxygen Saturation (Exit) 98 % 100 % 85 % 97 %    Rating of Perceived Exertion (Exercise) 13 13 13 13     Perceived Dyspnea (Exercise) 2 3 3 4     Duration Progress to 45 minutes of aerobic exercise without signs/symptoms of physical distress Progress to 45 minutes of aerobic exercise without signs/symptoms of physical distress Progress to 45 minutes of aerobic exercise without signs/symptoms of physical distress Progress to 45 minutes of aerobic exercise without signs/symptoms of physical distress    Intensity THRR unchanged THRR unchanged THRR unchanged THRR unchanged      Progression   Progression Continue to progress workloads to maintain intensity without signs/symptoms of physical distress. Continue to progress workloads to maintain intensity without signs/symptoms of physical distress. Continue to progress workloads to maintain intensity without signs/symptoms of physical distress. Continue to progress workloads to maintain intensity without signs/symptoms of physical distress.      Resistance Training   Training Prescription Yes Yes Yes Yes  Weight blue bands blue bands blue bands blue bands    Reps 10-15 10-15 10-15 10-15    Time 10 Minutes 10 Minutes 10 Minutes 10 Minutes      Oxygen   Oxygen Continuous Continuous Continuous Continuous    Liters 4-6 4-6 10 10       NuStep   Level 3 4 4 4     Minutes 17 17 17 17     METs  - 2.4 2.8 2.6      Arm Ergometer   Level  - 3 3  -    Minutes  - 17 17  -      Track   Laps 7 5 6 6     Minutes 17 17 17 17        Exercise Comments:   Exercise Goals and Review:     Exercise Goals    Row Name 05/28/17 1447             Exercise  Goals   Increase Physical Activity Yes       Intervention Provide advice, education, support and counseling about physical activity/exercise needs.;Develop an individualized exercise prescription for aerobic and resistive training based on initial evaluation findings, risk stratification, comorbidities and participant's personal goals.       Expected Outcomes Achievement of increased cardiorespiratory fitness and enhanced flexibility, muscular endurance and strength shown through measurements of functional capacity and personal statement of participant.       Increase Strength and Stamina Yes       Intervention Provide advice, education, support and counseling about physical activity/exercise needs.;Develop an individualized exercise prescription for aerobic and resistive training based on initial evaluation findings, risk stratification, comorbidities and participant's personal goals.       Expected Outcomes Achievement of increased cardiorespiratory fitness and enhanced flexibility, muscular endurance and strength shown through measurements of functional capacity and personal statement of participant.          Exercise Goals Re-Evaluation :     Exercise Goals Re-Evaluation    Row Name 06/12/17 0830 07/02/17 1152           Exercise Goal Re-Evaluation   Exercise Goals Review Increase Physical Activity;Increase Strength and Stamina;Able to understand and use Dyspnea scale;Able to understand and use rate of perceived exertion (RPE) scale;Knowledge and understanding of Target Heart Rate Range (THRR);Understanding of Exercise Prescription Increase Strength and Stamina;Increase Physical Activity;Able to understand and use Dyspnea scale;Able to understand and use rate of perceived exertion (RPE) scale;Knowledge and understanding of Target Heart Rate Range (THRR);Understanding of Exercise Prescription      Comments Patient has only attended one exercise session. Will cont to progress as able.  Patient  is progressing slow and steady in program. Patient has been desaturating on 8 liters. Permission has been granted from Dr. Lamonte Sakai to increase liter flow to titrate above 88%. Will cont to monitor and progress as able.       Expected Outcomes Through exercise and education at rehab and at home, patient will increase strength and stamina. Patient will also gain a better understanding of the need for physical activity on a daily basis and the effects it can have on quality of life. Through exercise and education at rehab and at home, patient will increase strength and stamina. Patient will also gain a better understanding of the need for physical activity on a daily basis and the effects it can have on quality of life.         Discharge Exercise Prescription (Final  Exercise Prescription Changes):     Exercise Prescription Changes - 07/05/17 1200      Response to Exercise   Blood Pressure (Admit) 120/64   Blood Pressure (Exercise) 120/80   Blood Pressure (Exit) 106/64   Heart Rate (Admit) 88 bpm   Heart Rate (Exercise) 110 bpm   Heart Rate (Exit) 85 bpm   Oxygen Saturation (Admit) 97 %   Oxygen Saturation (Exercise) 87 %  desaturated on 10L   Oxygen Saturation (Exit) 97 %   Rating of Perceived Exertion (Exercise) 13   Perceived Dyspnea (Exercise) 4   Duration Progress to 45 minutes of aerobic exercise without signs/symptoms of physical distress   Intensity THRR unchanged     Progression   Progression Continue to progress workloads to maintain intensity without signs/symptoms of physical distress.     Resistance Training   Training Prescription Yes   Weight blue bands   Reps 10-15   Time 10 Minutes     Oxygen   Oxygen Continuous   Liters 10     NuStep   Level 4   Minutes 17   METs 2.6     Track   Laps 6   Minutes 17      Nutrition:  Target Goals: Understanding of nutrition guidelines, daily intake of sodium <1574m, cholesterol <2084m calories 30% from fat and 7% or less  from saturated fats, daily to have 5 or more servings of fruits and vegetables.  Biometrics:     Pre Biometrics - 05/28/17 1509      Pre Biometrics   Grip Strength 36 kg       Nutrition Therapy Plan and Nutrition Goals:   Nutrition Discharge: Rate Your Plate Scores:   Nutrition Goals Re-Evaluation:   Nutrition Goals Discharge (Final Nutrition Goals Re-Evaluation):   Psychosocial: Target Goals: Acknowledge presence or absence of significant depression and/or stress, maximize coping skills, provide positive support system. Participant is able to verbalize types and ability to use techniques and skills needed for reducing stress and depression.  Initial Review & Psychosocial Screening:     Initial Psych Review & Screening - 05/28/17 1503      Initial Review   Current issues with None Identified     Family Dynamics   Good Support System? Yes     Barriers   Psychosocial barriers to participate in program There are no identifiable barriers or psychosocial needs.     Screening Interventions   Interventions Encouraged to exercise      Quality of Life Scores:   PHQ-9: Recent Review Flowsheet Data    [facility]   Decreased Interest 0   Down, Depressed, Hopeless 0   PHQ - 2 Score 0   Altered sleeping 0   Tired, decreased energy 0   Change in appetite 0   Feeling bad or failure about yourself  0   Trouble concentrating 0   Moving slowly or fidgety/restless 0   Suicidal thoughts 0   PHQ-9 Score 0   Difficult doing work/chores Not difficult at all     Interpretation of Total Score  Total Score Depression Severity:  1-4 = Minimal depression, 5-9 = Mild depression, 10-14 = Moderate depression, 15-19 = Moderately severe depression, 20-27 = Severe depression   Psychosocial Evaluation and Intervention:     Psychosocial Evaluation - 05/28/17 1506      Psychosocial Evaluation & Interventions   Interventions Encouraged to exercise with  the program and follow exercise prescription  Expected Outcomes patient will remain free from psychosocial barriers to participation   Continue Psychosocial Services  No Follow up required      Psychosocial Re-Evaluation:     Psychosocial Re-Evaluation    [place] Name 06/13/17 1820 07/06/17 1223           Psychosocial Re-Evaluation   Current issues with None Identified None Identified      Expected Outcomes patient will remain free from psychosocial barriers to participation in pulmonary rehab  patient will remain free from psychosocial barriers to participation in pulmonary rehab       Continue Psychosocial Services  No Follow up required No Follow up required         Psychosocial Discharge (Final Psychosocial Re-Evaluation):     Psychosocial Re-Evaluation - 07/06/17 1223      Psychosocial Re-Evaluation   Current issues with None Identified   Expected Outcomes patient will remain free from psychosocial barriers to participation in pulmonary rehab    Continue Psychosocial Services  No Follow up required      Education: Education Goals: Education classes will be provided on a weekly basis, covering required topics. Participant will state understanding/return demonstration of topics presented.  Learning Barriers/Preferences:     Learning Barriers/Preferences - 05/28/17 1502      Learning Barriers/Preferences   Learning Barriers None   Learning Preferences Computer/Internet;Group Instruction;Individual Instruction;Written Material      Education Topics: Risk Factor Reduction:  -Group instruction that is supported by a PowerPoint presentation. Instructor discusses the definition of a risk factor, different risk factors for pulmonary disease, and how the heart and lungs work together.     Nutrition for Pulmonary Patient:  -Group instruction provided by PowerPoint slides, verbal discussion, and written materials to support subject matter. The instructor gives an  explanation and review of healthy diet recommendations, which includes a discussion on weight management, recommendations for fruit and vegetable consumption, as well as protein, fluid, caffeine, fiber, sodium, sugar, and alcohol. Tips for eating when patients are short of breath are discussed.   PULMONARY REHAB OTHER RESPIRATORY from 07/05/2017 in [place]  Date  06/07/17  Educator  Nutritionist  Instruction Review Code  2- meets goals/outcomes      Pursed Lip Breathing:  -Group instruction that is supported by demonstration and informational handouts. Instructor discusses the benefits of pursed lip and diaphragmatic breathing and detailed demonstration on how to preform both.     Oxygen Safety:  -Group instruction provided by PowerPoint, verbal discussion, and written material to support subject matter. There is an overview of "What is Oxygen" and "Why do we need it".  Instructor also reviews how to create a safe environment for oxygen use, the importance of using oxygen as prescribed, and the risks of noncompliance. There is a brief discussion on traveling with oxygen and resources the patient may utilize.   Oxygen Equipment:  -Group instruction provided by [facility] Staff utilizing handouts, written materials, and equipment demonstrations.   Signs and Symptoms:  -Group instruction provided by written material and verbal discussion to support subject matter. Warning signs and symptoms of infection, stroke, and heart attack are reviewed and when to call the physician/911 reinforced. Tips for preventing the spread of infection discussed.   PULMONARY REHAB OTHER RESPIRATORY from 07/05/2017 in [place]  Date  07/05/17  Educator  RN  Instruction Review Code  2- meets goals/outcomes      Advanced Directives:  -Group instruction provided  by verbal instruction and written material to support subject matter. Instructor  reviews Advanced Directive laws and proper instruction for filling out document.   Pulmonary Video:  -Group video education that reviews the importance of medication and oxygen compliance, exercise, good nutrition, pulmonary hygiene, and pursed lip and diaphragmatic breathing for the pulmonary patient.   Exercise for the Pulmonary Patient:  -Group instruction that is supported by a PowerPoint presentation. Instructor discusses benefits of exercise, core components of exercise, frequency, duration, and intensity of an exercise routine, importance of utilizing pulse oximetry during exercise, safety while exercising, and options of places to exercise outside of rehab.     PULMONARY REHAB OTHER RESPIRATORY from 07/05/2017 in [place]  Date  06/26/17  Educator  ep  Instruction Review Code  2- meets goals/outcomes      Pulmonary Medications:  -Verbally interactive group education provided by instructor with focus on inhaled medications and proper administration.   PULMONARY REHAB OTHER RESPIRATORY from 07/05/2017 in [place]  Date  06/14/17  Educator  pharmacist  Instruction Review Code  2- meets goals/outcomes      Anatomy and Physiology of the Respiratory System and Intimacy:  -Group instruction provided by PowerPoint, verbal discussion, and written material to support subject matter. Instructor reviews respiratory cycle and anatomical components of the respiratory system and their functions. Instructor also reviews differences in obstructive and restrictive respiratory diseases with examples of each. Intimacy, Sex, and Sexuality differences are reviewed with a discussion on how relationships can change when diagnosed with pulmonary disease. Common sexual concerns are reviewed.   MD DAY -A group question and answer session with a medical doctor that allows participants to ask questions that relate to their pulmonary disease  state.   OTHER EDUCATION -Group or individual verbal, written, or video instructions that support the educational goals of the pulmonary rehab program.   Knowledge Questionnaire Score:     Knowledge Questionnaire Score - 05/31/17 0901      Knowledge Questionnaire Score   Pre Score 10/13      Core Components/Risk Factors/Patient Goals at Admission:     Personal Goals and Risk Factors at Admission - 05/28/17 1503      Core Components/Risk Factors/Patient Goals on Admission   Improve shortness of breath with ADL's Yes   Intervention Provide education, individualized exercise plan and daily activity instruction to help decrease symptoms of SOB with activities of daily living.   Expected Outcomes Short Term: Achieves a reduction of symptoms when performing activities of daily living.   Develop more efficient breathing techniques such as purse lipped breathing and diaphragmatic breathing; and practicing self-pacing with activity Yes   Intervention Provide education, demonstration and support about specific breathing techniuqes utilized for more efficient breathing. Include techniques such as pursed lipped breathing, diaphragmatic breathing and self-pacing activity.   Expected Outcomes Short Term: Participant will be able to demonstrate and use breathing techniques as needed throughout daily activities.   Increase knowledge of respiratory medications and ability to use respiratory devices properly  Yes   Intervention Provide education and demonstration as needed of appropriate use of medications, inhalers, and oxygen therapy.   Expected Outcomes Short Term: Achieves understanding of medications use. Understands that oxygen is a medication prescribed by physician. Demonstrates appropriate use of inhaler and oxygen therapy.      Core Components/Risk Factors/Patient Goals Review:      Goals and Risk Factor Review    Row Name 06/13/17 1819  07/06/17 1222           Core Components/Risk  Factors/Patient Goals Review   Personal Goals Review Improve shortness of breath with ADL's;Develop more efficient breathing techniques such as purse lipped breathing and diaphragmatic breathing and practicing self-pacing with activity.;Increase knowledge of respiratory medications and ability to use respiratory devices properly. Improve shortness of breath with ADL's;Develop more efficient breathing techniques such as purse lipped breathing and diaphragmatic breathing and practicing self-pacing with activity.;Increase knowledge of respiratory medications and ability to use respiratory devices properly.      Review patient has only attended 2 sessions since admission. will evaluate progression towards goals over the next 30 days patient is making slow progress towards goals related to the initial lack of oxygen orders for exercise. Now have order to maintain saturations 88 or greater with unlimited ammount of oxygen with exertion. He is using plb without cueing and paces himself while taking restbreaks with walking.      Expected Outcomes see admission outcomes see admission outcomes         Core Components/Risk Factors/Patient Goals at Discharge (Final Review):      Goals and Risk Factor Review - 07/06/17 1222      Core Components/Risk Factors/Patient Goals Review   Personal Goals Review Improve shortness of breath with ADL's;Develop more efficient breathing techniques such as purse lipped breathing and diaphragmatic breathing and practicing self-pacing with activity.;Increase knowledge of respiratory medications and ability to use respiratory devices properly.   Review patient is making slow progress towards goals related to the initial lack of oxygen orders for exercise. Now have order to maintain saturations 88 or greater with unlimited ammount of oxygen with exertion. He is using plb without cueing and paces himself while taking restbreaks with walking.   Expected Outcomes see admission outcomes       ITP Comments:   Comments: ITP REVIEW Pt is making slow progress toward pulmonary rehab goals after completing 9 sessions. Expect to see greater progress over the next 30 days now that we have sufficient orders for oxygen during exercise. Patient works hard however until now he was limited by the amount of oxygen he could use. This is no longer a barrier. Recommend continued exercise, life style modification, education, and utilization of breathing techniques to increase stamina and strength and decrease shortness of breath with exertion.

## 2017-07-09 NOTE — Telephone Encounter (Signed)
Spoke with Liborio Nixon at Pulmonary rehab.  Kirt Boys is out of office today.   She will have her call us back tomorrow.

## 2017-07-09 NOTE — Telephone Encounter (Signed)
Yes ok to order. Also ask them to document an o2 titration on oxymizer at pulm rehab so we can dose appropriately.

## 2017-07-10 ENCOUNTER — Encounter (HOSPITAL_COMMUNITY)
Admission: RE | Admit: 2017-07-10 | Discharge: 2017-07-10 | Disposition: A | Discharge status: Home / Self Care | Payer: PPO | Source: Ambulatory Visit | Attending: Emergency Medicine | Admitting: Emergency Medicine

## 2017-07-10 VITALS — Wt 184.7 lb

## 2017-07-10 DIAGNOSIS — J439 Emphysema, unspecified: Secondary | ICD-10-CM | POA: Diagnosis not present

## 2017-07-10 NOTE — Progress Notes (Signed)
Daily Session Note  Patient Details  Name: Charles Archer MRN: [id number] Date of Birth: 20-May-1936 Referring Provider:     Pulmonary Rehab Walk Test from 05/31/2017 in [place]  Referring Provider  Dr. Lamonte Sakai      Encounter Date: 07/10/2017  Check In:     Session Check In - 07/10/17 1030      Check-In   Location [facility]   Staff Present Rodney Langton, RN;Ashleah Valtierra, MS, ACSM RCEP, Exercise Physiologist;Portia Rollene Rotunda, RN, BSN   Supervising physician immediately available to respond to emergencies [facility] immediately available   Physician(s) Dr. Wynelle [place]   Medication changes reported     No   Fall or balance concerns reported    No   Tobacco Cessation No Change   Warm-up and Cool-down Performed as group-led instruction   Resistance Training Performed Yes   VAD Patient? No     Pain Assessment   Currently in Pain? No/denies   Multiple Pain Sites No      Capillary Blood Glucose: No results found for this or any previous visit (from the past 24 hour(s)).      Exercise Prescription Changes - 07/10/17 1200      Response to Exercise   Blood Pressure (Admit) 119/64   Blood Pressure (Exercise) 104/62   Blood Pressure (Exit) 90/50   Heart Rate (Admit) 98 bpm   Heart Rate (Exercise) 116 bpm   Heart Rate (Exit) 100 bpm   Oxygen Saturation (Admit) 94 %   Oxygen Saturation (Exercise) 88 %   Oxygen Saturation (Exit) 99 %   Rating of Perceived Exertion (Exercise) 14   Perceived Dyspnea (Exercise) 3   Duration Progress to 45 minutes of aerobic exercise without signs/symptoms of physical distress   Intensity THRR unchanged     Progression   Progression Continue to progress workloads to maintain intensity without signs/symptoms of physical distress.     Resistance Training   Training Prescription Yes   Weight blue bands   Reps 10-15   Time 10 Minutes     Oxygen   Oxygen Continuous   Liters 10     NuStep   Level 5   Minutes 17   METs 2.2     Arm Ergometer   Level 4   Minutes 17     Track   Laps 9   Minutes 17      History  Smoking Status  . Former Smoker  . Packs/day: 3.00  . Years: 30.00  . Types: Cigarettes  . Quit date: 09/03/1989  Smokeless Tobacco  . Never Used    Goals Met:  Exercise tolerated well No report of cardiac concerns or symptoms Strength training completed today  Goals Unmet:  Not Applicable  Comments: Service time is from 10:30a to 12:05p    Dr. Rush Farmer is Medical Director for Pulmonary Rehab at [facility].

## 2017-07-11 DIAGNOSIS — R972 Elevated prostate specific antigen [PSA]: Secondary | ICD-10-CM | POA: Diagnosis not present

## 2017-07-11 NOTE — Telephone Encounter (Signed)
Spoke with Marily Memos at pulmonary rehab. Kirt Boys does not work on Wednesdays. Marily Memos has taken a message for Kirt Boys to call us back tomorrow.

## 2017-07-12 ENCOUNTER — Encounter (HOSPITAL_COMMUNITY)
Admission: RE | Admit: 2017-07-12 | Discharge: 2017-07-12 | Disposition: A | Discharge status: Home / Self Care | Payer: PPO | Source: Ambulatory Visit | Attending: Emergency Medicine | Admitting: Emergency Medicine

## 2017-07-12 ENCOUNTER — Telehealth: Payer: Self-pay | Admitting: Emergency Medicine

## 2017-07-12 VITALS — Wt 186.7 lb

## 2017-07-12 DIAGNOSIS — J439 Emphysema, unspecified: Secondary | ICD-10-CM

## 2017-07-12 NOTE — Progress Notes (Signed)
Daily Session Note  Patient Details  Name: Charles Archer MRN: [id number] Date of Birth: 01-27-1936 Referring Provider:     Pulmonary Rehab Walk Test from 05/31/2017 in [place]  Referring Provider  Dr. Lamonte Sakai      Encounter Date: 07/12/2017  Check In:     Session Check In - 07/12/17 1100      Check-In   Location [facility]   Staff Present Trish Fountain, RN, BSN;Lisa Ysidro Evert, RN;Molly diVincenzo, MS, ACSM RCEP, Exercise Physiologist   Supervising physician immediately available to respond to emergencies [facility] immediately available   Physician(s) Dr. Wendee Beavers   Medication changes reported     No   Fall or balance concerns reported    No   Tobacco Cessation No Change   Warm-up and Cool-down Performed as group-led instruction   Resistance Training Performed Yes   VAD Patient? No     Pain Assessment   Currently in Pain? No/denies   Multiple Pain Sites No      Capillary Blood Glucose: No results found for this or any previous visit (from the past 24 hour(s)).      Exercise Prescription Changes - 07/12/17 1222      Response to Exercise   Blood Pressure (Admit) 118/56   Blood Pressure (Exercise) 104/58   Blood Pressure (Exit) 104/60   Heart Rate (Admit) 92 bpm   Heart Rate (Exercise) 107 bpm   Heart Rate (Exit) 85 bpm   Oxygen Saturation (Admit) 97 %   Oxygen Saturation (Exercise) 85 %   Oxygen Saturation (Exit) 99 %   Rating of Perceived Exertion (Exercise) 13   Perceived Dyspnea (Exercise) 3   Duration Progress to 45 minutes of aerobic exercise without signs/symptoms of physical distress   Intensity THRR unchanged     Progression   Progression Continue to progress workloads to maintain intensity without signs/symptoms of physical distress.     Resistance Training   Training Prescription Yes   Weight blue bands   Reps 10-15   Time 10 Minutes     Oxygen   Oxygen Continuous   Liters 10     NuStep   Level 6   Minutes 17   METs 3.1     Arm Ergometer   Level 4   Minutes 17      History  Smoking Status  . Former Smoker  . Packs/day: 3.00  . Years: 30.00  . Types: Cigarettes  . Quit date: 09/03/1989  Smokeless Tobacco  . Never Used    Goals Met:  Independence with exercise equipment Improved SOB with ADL's Using PLB without cueing & demonstrates good technique Exercise tolerated well No report of cardiac concerns or symptoms Strength training completed today  Goals Unmet:  Not Applicable  Comments: Service time is from 1030 to 1210   Dr. Rush Farmer is Medical Director for Pulmonary Rehab at [facility].

## 2017-07-13 NOTE — Telephone Encounter (Signed)
I am still awaiting fax. RB would you order oxymizer?

## 2017-07-13 NOTE — Telephone Encounter (Signed)
Spoke with Charles Archer at cardiac rehab and I asked her to re-fax the readings to me so I can put it in RB's folder and route message. Will await fax.

## 2017-07-16 NOTE — Telephone Encounter (Signed)
Called and spoke to Kimberly with pulmonary rehab and informed her of the recs per RB. She verbalized understanding and states she faxed over pt's walk log showign desaturation, this is only for review. Order placed for oxymizer. Nothing further needed.

## 2017-07-16 NOTE — Telephone Encounter (Signed)
RB has already given verbal order for oxymizer. Please see phone note from 07/03/17. Will sign off.

## 2017-07-17 ENCOUNTER — Encounter (HOSPITAL_COMMUNITY): Payer: PPO

## 2017-07-17 ENCOUNTER — Ambulatory Visit (INDEPENDENT_AMBULATORY_CARE_PROVIDER_SITE_OTHER)
Admission: RE | Admit: 2017-07-17 | Discharge: 2017-07-17 | Disposition: A | Discharge status: Home / Self Care | Payer: PPO | Source: Ambulatory Visit | Attending: Emergency Medicine | Admitting: Emergency Medicine

## 2017-07-17 DIAGNOSIS — J439 Emphysema, unspecified: Secondary | ICD-10-CM | POA: Diagnosis not present

## 2017-07-17 DIAGNOSIS — R911 Solitary pulmonary nodule: Secondary | ICD-10-CM | POA: Diagnosis not present

## 2017-07-17 DIAGNOSIS — J449 Chronic obstructive pulmonary disease, unspecified: Secondary | ICD-10-CM | POA: Diagnosis not present

## 2017-07-19 ENCOUNTER — Encounter (HOSPITAL_COMMUNITY)
Admission: RE | Admit: 2017-07-19 | Discharge: 2017-07-19 | Disposition: A | Discharge status: Home / Self Care | Payer: PPO | Source: Ambulatory Visit | Attending: Emergency Medicine | Admitting: Emergency Medicine

## 2017-07-19 VITALS — Wt 186.5 lb

## 2017-07-19 DIAGNOSIS — J439 Emphysema, unspecified: Secondary | ICD-10-CM | POA: Diagnosis not present

## 2017-07-19 DIAGNOSIS — Z87891 Personal history of nicotine dependence: Secondary | ICD-10-CM | POA: Diagnosis not present

## 2017-07-19 DIAGNOSIS — Z79899 Other long term (current) drug therapy: Secondary | ICD-10-CM | POA: Diagnosis not present

## 2017-07-19 DIAGNOSIS — Z7982 Long term (current) use of aspirin: Secondary | ICD-10-CM | POA: Insufficient documentation

## 2017-07-19 DIAGNOSIS — M109 Gout, unspecified: Secondary | ICD-10-CM | POA: Insufficient documentation

## 2017-07-19 DIAGNOSIS — Z7951 Long term (current) use of inhaled steroids: Secondary | ICD-10-CM | POA: Insufficient documentation

## 2017-07-19 DIAGNOSIS — I129 Hypertensive chronic kidney disease with stage 1 through stage 4 chronic kidney disease, or unspecified chronic kidney disease: Secondary | ICD-10-CM | POA: Insufficient documentation

## 2017-07-19 DIAGNOSIS — K219 Gastro-esophageal reflux disease without esophagitis: Secondary | ICD-10-CM | POA: Diagnosis not present

## 2017-07-19 DIAGNOSIS — E785 Hyperlipidemia, unspecified: Secondary | ICD-10-CM | POA: Insufficient documentation

## 2017-07-19 DIAGNOSIS — N189 Chronic kidney disease, unspecified: Secondary | ICD-10-CM | POA: Insufficient documentation

## 2017-07-19 NOTE — Progress Notes (Signed)
Daily Session Note  Patient Details  Name: Charles Archer MRN: [id number] Date of Birth: April 03, 1936 Referring Provider:     Pulmonary Rehab Walk Test from 05/31/2017 in [place]  Referring Provider  Dr. Lamonte Sakai      Encounter Date: 07/19/2017  Check In:     Session Check In - 07/19/17 1044      Check-In   Location [facility]   Staff Present Rodney Langton, RN;Willowdean Luhmann, MS, ACSM RCEP, Exercise Physiologist   Supervising physician immediately available to respond to emergencies [facility] immediately available   Physician(s) Dr. Zigmund Channin   Medication changes reported     No   Fall or balance concerns reported    No   Tobacco Cessation No Change   Warm-up and Cool-down Performed as group-led instruction   Resistance Training Performed Yes   VAD Patient? No     Pain Assessment   Currently in Pain? No/denies   Multiple Pain Sites No      Capillary Blood Glucose: No results found for this or any previous visit (from the past 24 hour(s)).      Exercise Prescription Changes - 07/19/17 1300      Response to Exercise   Blood Pressure (Admit) 106/58   Blood Pressure (Exercise) 118/62   Blood Pressure (Exit) 96/64   Heart Rate (Admit) 92 bpm   Heart Rate (Exercise) 109 bpm   Heart Rate (Exit) 69 bpm   Oxygen Saturation (Admit) 94 %   Oxygen Saturation (Exercise) 87 %   Oxygen Saturation (Exit) 93 %   Rating of Perceived Exertion (Exercise) 13   Perceived Dyspnea (Exercise) 2   Duration Progress to 45 minutes of aerobic exercise without signs/symptoms of physical distress   Intensity THRR unchanged     Progression   Progression Continue to progress workloads to maintain intensity without signs/symptoms of physical distress.     Resistance Training   Training Prescription Yes   Weight blue bands   Reps 10-15   Time 10 Minutes     Oxygen   Oxygen Continuous   Liters 10     NuStep   Level 6   Minutes  17   METs 2.7     Arm Ergometer   Level 4   Minutes 17      History  Smoking Status  . Former Smoker  . Packs/day: 3.00  . Years: 30.00  . Types: Cigarettes  . Quit date: 09/03/1989  Smokeless Tobacco  . Never Used    Goals Met:  Exercise tolerated well No report of cardiac concerns or symptoms Strength training completed today  Goals Unmet:  Not Applicable  Comments: Service time is from 10:30a to 12:10p    Dr. Rush Farmer is Medical Director for Pulmonary Rehab at [facility].

## 2017-07-23 DIAGNOSIS — J449 Chronic obstructive pulmonary disease, unspecified: Secondary | ICD-10-CM | POA: Diagnosis not present

## 2017-07-24 ENCOUNTER — Encounter (HOSPITAL_COMMUNITY)
Admission: RE | Admit: 2017-07-24 | Discharge: 2017-07-24 | Disposition: A | Discharge status: Home / Self Care | Payer: PPO | Source: Ambulatory Visit | Attending: Emergency Medicine | Admitting: Emergency Medicine

## 2017-07-24 VITALS — Wt 184.3 lb

## 2017-07-24 DIAGNOSIS — J439 Emphysema, unspecified: Secondary | ICD-10-CM | POA: Diagnosis not present

## 2017-07-24 NOTE — Progress Notes (Signed)
Daily Session Note  Patient Details  Name: Charles Archer MRN: [id number] Date of Birth: 1936/06/08 Referring Provider:     Pulmonary Rehab Walk Test from 05/31/2017 in [place]  Referring Provider  Dr. Lamonte Sakai      Encounter Date: 07/24/2017  Check In:     Session Check In - 07/24/17 1103      Check-In   Location [facility]   Staff Present Su Hilt, MS, ACSM RCEP, Exercise Physiologist;Terica Yogi Ysidro Evert, RN;Portia Rollene Rotunda, RN, BSN   Supervising physician immediately available to respond to emergencies [facility] immediately available   Physician(s) Dr. Zigmund Ryler   Medication changes reported     No   Fall or balance concerns reported    No   Tobacco Cessation No Change   Warm-up and Cool-down Performed as group-led instruction   Resistance Training Performed Yes   VAD Patient? No     Pain Assessment   Currently in Pain? No/denies   Multiple Pain Sites No      Capillary Blood Glucose: No results found for this or any previous visit (from the past 24 hour(s)).      Exercise Prescription Changes - 07/24/17 1200      Response to Exercise   Blood Pressure (Admit) 130/74   Blood Pressure (Exercise) 150/82   Blood Pressure (Exit) 104/70   Heart Rate (Admit) 104 bpm   Heart Rate (Exercise) 1256 bpm   Heart Rate (Exit) 102 bpm   Oxygen Saturation (Admit) 94 %   Oxygen Saturation (Exercise) 89 %   Oxygen Saturation (Exit) 97 %   Rating of Perceived Exertion (Exercise) 14   Perceived Dyspnea (Exercise) 2   Duration Progress to 45 minutes of aerobic exercise without signs/symptoms of physical distress   Intensity THRR unchanged     Progression   Progression Continue to progress workloads to maintain intensity without signs/symptoms of physical distress.     Resistance Training   Training Prescription Yes   Weight blue bands   Reps 10-15   Time 10 Minutes     NuStep   Level 6   Minutes 17   METs 2.5     Arm Ergometer   Level 6   Minutes 17      History  Smoking Status  . Former Smoker  . Packs/day: 3.00  . Years: 30.00  . Types: Cigarettes  . Quit date: 09/03/1989  Smokeless Tobacco  . Never Used    Goals Met:  Exercise tolerated well No report of cardiac concerns or symptoms Strength training completed today  Goals Unmet:  Not Applicable  Comments: Service time is from 1030 to 1230    Dr. Rush Farmer is Medical Director for Pulmonary Rehab at [facility].

## 2017-07-26 ENCOUNTER — Encounter (HOSPITAL_COMMUNITY): Admission: RE | Admit: 2017-07-26 | Payer: PPO | Source: Ambulatory Visit

## 2017-07-26 ENCOUNTER — Telehealth (HOSPITAL_COMMUNITY): Payer: Self-pay | Admitting: *Deleted

## 2017-07-28 DIAGNOSIS — J449 Chronic obstructive pulmonary disease, unspecified: Secondary | ICD-10-CM | POA: Diagnosis not present

## 2017-07-31 ENCOUNTER — Encounter (HOSPITAL_COMMUNITY)
Admission: RE | Admit: 2017-07-31 | Discharge: 2017-07-31 | Disposition: A | Discharge status: Home / Self Care | Payer: PPO | Source: Ambulatory Visit | Attending: Emergency Medicine | Admitting: Emergency Medicine

## 2017-07-31 VITALS — Wt 184.3 lb

## 2017-07-31 DIAGNOSIS — J439 Emphysema, unspecified: Secondary | ICD-10-CM

## 2017-07-31 NOTE — Progress Notes (Signed)
Daily Session Note  Patient Details  Name: Charles Archer MRN: [id number] Date of Birth: Dec 13, 1935 Referring Provider:     Pulmonary Rehab Walk Test from 05/31/2017 in [place]  Referring Provider  Dr. Lamonte Sakai      Encounter Date: 07/31/2017  Check In:     Session Check In - 07/31/17 1020      Check-In   Location [facility]   Staff Present Su Hilt, MS, ACSM RCEP, Exercise Physiologist;Lisa Ysidro Evert, RN;Portia Rollene Rotunda, RN, BSN   Supervising physician immediately available to respond to emergencies [facility] immediately available   Physician(s) Dr. Verlon Au   Medication changes reported     No   Fall or balance concerns reported    No   Tobacco Cessation No Change   Warm-up and Cool-down Performed as group-led instruction   Resistance Training Performed Yes   VAD Patient? No     Pain Assessment   Currently in Pain? No/denies   Multiple Pain Sites No      Capillary Blood Glucose: No results found for this or any previous visit (from the past 24 hour(s)).      Exercise Prescription Changes - 07/31/17 1200      Response to Exercise   Blood Pressure (Admit) 120/68   Blood Pressure (Exercise) 128/80   Blood Pressure (Exit) 108/60   Heart Rate (Admit) 93 bpm   Heart Rate (Exercise) 114 bpm   Heart Rate (Exit) 93 bpm   Oxygen Saturation (Admit) 99 %   Oxygen Saturation (Exercise) 85 %   Oxygen Saturation (Exit) 87 %   Rating of Perceived Exertion (Exercise) 13   Perceived Dyspnea (Exercise) 3   Duration Progress to 45 minutes of aerobic exercise without signs/symptoms of physical distress   Intensity THRR unchanged     Progression   Progression Continue to progress workloads to maintain intensity without signs/symptoms of physical distress.     Resistance Training   Training Prescription Yes   Weight blue bands   Reps 10-15   Time 10 Minutes     Oxygen   Oxygen Continuous   Liters 10     NuStep    Level 6   Minutes 17   METs 2.8     Arm Ergometer   Level 6   Minutes 17     Track   Laps 7   Minutes 17      History  Smoking Status  . Former Smoker  . Packs/day: 3.00  . Years: 30.00  . Types: Cigarettes  . Quit date: 09/03/1989  Smokeless Tobacco  . Never Used    Goals Met:  Exercise tolerated well No report of cardiac concerns or symptoms Strength training completed today  Goals Unmet:  Not Applicable  Comments: Service time is from 10:30a to 12:10p     Dr. Rush Farmer is Medical Director for Pulmonary Rehab at [facility]

## 2017-08-02 ENCOUNTER — Encounter (HOSPITAL_COMMUNITY)
Admission: RE | Admit: 2017-08-02 | Discharge: 2017-08-02 | Disposition: A | Discharge status: Home / Self Care | Payer: PPO | Source: Ambulatory Visit | Attending: Emergency Medicine | Admitting: Emergency Medicine

## 2017-08-02 ENCOUNTER — Telehealth: Payer: Self-pay | Admitting: Emergency Medicine

## 2017-08-02 VITALS — Wt 185.0 lb

## 2017-08-02 DIAGNOSIS — J439 Emphysema, unspecified: Secondary | ICD-10-CM | POA: Diagnosis not present

## 2017-08-02 DIAGNOSIS — J449 Chronic obstructive pulmonary disease, unspecified: Secondary | ICD-10-CM

## 2017-08-02 NOTE — Telephone Encounter (Signed)
Faxed to [facility].

## 2017-08-02 NOTE — Telephone Encounter (Signed)
I have printed RX and called number to call Molly back.

## 2017-08-02 NOTE — Progress Notes (Signed)
Daily Session Note  Patient Details  Name: Charles Archer MRN: [id number] Date of Birth: [birth year] Referring Provider:     Pulmonary Rehab Walk Test from 05/31/2017 in [place]  Referring Provider  Dr. Lamonte Sakai      Encounter Date: 08/02/2017  Check In:     Session Check In - 08/02/17 1023      Check-In   Location [facility]   Staff Present Su Hilt, MS, ACSM RCEP, Exercise Physiologist;Lisa Ysidro Evert, RN;Portia Rollene Rotunda, RN, BSN   Supervising physician immediately available to respond to emergencies [facility] immediately available   Physician(s) Dr. Horris Latino   Medication changes reported     No   Fall or balance concerns reported    No   Tobacco Cessation No Change   Warm-up and Cool-down Performed as group-led instruction   Resistance Training Performed Yes   VAD Patient? No     Pain Assessment   Currently in Pain? No/denies   Multiple Pain Sites No      Capillary Blood Glucose: No results found for this or any previous visit (from the past 24 hour(s)).      Exercise Prescription Changes - 08/02/17 1200      Response to Exercise   Blood Pressure (Admit) 142/70   Blood Pressure (Exercise) 144/70   Blood Pressure (Exit) 110/72   Heart Rate (Admit) 97 bpm   Heart Rate (Exercise) 117 bpm   Heart Rate (Exit) 95 bpm   Oxygen Saturation (Admit) 92 %   Oxygen Saturation (Exercise) 81 %  10 liters 89% 15 liters   Oxygen Saturation (Exit) 98 %   Rating of Perceived Exertion (Exercise) 14   Perceived Dyspnea (Exercise) 4   Duration Progress to 45 minutes of aerobic exercise without signs/symptoms of physical distress   Intensity THRR unchanged     Progression   Progression Continue to progress workloads to maintain intensity without signs/symptoms of physical distress.     Resistance Training   Training Prescription Yes   Weight blue bands   Reps 10-15   Time 10 Minutes     Oxygen   Oxygen Continuous    Liters 10     Arm Ergometer   Level 6   Minutes 17     Track   Laps 8   Minutes 17      History  Smoking Status  . Former Smoker  . Packs/day: 3.00  . Years: 30.00  . Types: Cigarettes  . Quit date: 09/03/1989  Smokeless Tobacco  . Never Used    Goals Met:  Exercise tolerated well No report of cardiac concerns or symptoms Strength training completed today  Goals Unmet:  Not Applicable  Comments: Service time is from 10:30a to 12:35p    Dr. Rush Farmer is Medical Director for Pulmonary Rehab at [facility].

## 2017-08-06 NOTE — Progress Notes (Signed)
Pulmonary Individual Treatment Plan  Patient Details  Name: Charles Archer MRN: [id number] Date of Birth: 1936-04-11 Referring Provider:     Pulmonary Rehab Walk Test from 05/31/2017 in [facility]  Referring Provider  Dr. Lamonte Sakai      Initial Encounter Date:    Pulmonary Rehab Walk Test from 05/31/2017 in [facility]  Date  05/31/17  Referring Provider  Dr. Lamonte Sakai      Visit Diagnosis: Pulmonary emphysema, unspecified emphysema type ([place])  Patient's Home Medications on Admission:   Current Outpatient Prescriptions:  .  acetaminophen (TYLENOL ARTHRITIS PAIN) 650 MG CR tablet, Take 650 mg by mouth every 8 (eight) hours as needed for pain., Disp: , Rfl:  .  ADVAIR DISKUS 250-50 MCG/DOSE AEPB, INHALE ONE PUFF BY MOUTH TWICE A DAY (Patient not taking: Reported on 05/28/2017), Disp: 60 each, Rfl: 4 .  albuterol (PROAIR HFA) 108 (90 Base) MCG/ACT inhaler, Inhale 2 puffs into the lungs every 6 (six) hours as needed for wheezing or shortness of breath., Disp: 1 Inhaler, Rfl: 5 .  allopurinol (ZYLOPRIM) 100 MG tablet, Take 100 mg by mouth daily., Disp: , Rfl:  .  aspirin 81 MG tablet, Take 81 mg by mouth daily., Disp: , Rfl:  .  B Complex-Folic Acid (SUPER B COMPLEX MAXI PO), Take 1 tablet by mouth daily. , Disp: , Rfl:  .  calcium carbonate (OS-CAL) 600 MG TABS tablet, Take 600 mg by mouth daily., Disp: , Rfl:  .  Cinnamon 500 MG capsule, Take 1,000 mg by mouth daily., Disp: , Rfl:  .  docusate sodium (COLACE) 50 MG capsule, Take 50 mg by mouth daily as needed for mild constipation. , Disp: , Rfl:  .  fluorouracil (EFUDEX) 5 % cream, Apply 5 % topically 2 (two) times daily as needed. , Disp: , Rfl:  .  fluticasone (FLONASE) 50 MCG/ACT nasal spray, PLACE 2 SPRAYS INTO BOTH NOSTRILS DAILY., Disp: 16 g, Rfl: 5 .  Ginkgo Biloba 40 MG TABS, Take 40 mg by mouth daily. , Disp: , Rfl:  .  ipratropium-albuterol (DUONEB) 0.5-2.5 (3) MG/3ML SOLN,  Take 3 mLs by nebulization 4 (four) times daily., Disp: 360 mL, Rfl: 5 .  irbesartan (AVAPRO) 300 MG tablet, Take 1 tablet by mouth daily., Disp: , Rfl:  .  Misc Natural Products (OSTEO BI-FLEX ADV JOINT SHIELD PO), Take 1,500 mg by mouth daily., Disp: , Rfl:  .  Multiple Vitamin (MULTIVITAMIN) capsule, Take 1 capsule by mouth daily., Disp: , Rfl:  .  Naproxen Sodium (ALEVE PO), Take 220 mg by mouth as needed (for pain). , Disp: , Rfl:  .  niacin 250 MG tablet, Take 250 mg by mouth at bedtime., Disp: , Rfl:  .  sildenafil (REVATIO) 20 MG tablet, Take 1 tablet by mouth daily., Disp: , Rfl:  .  tamsulosin (FLOMAX) 0.4 MG CAPS capsule, Take 1 capsule by mouth daily., Disp: , Rfl:  .  vitamin B-12 (CYANOCOBALAMIN) 1000 MCG tablet, Take 1,000 mcg by mouth daily., Disp: , Rfl:  .  zinc gluconate 50 MG tablet, Take 50 mg by mouth daily., Disp: , Rfl:   Past Medical History: Past Medical History:  Diagnosis Date  . Abnormal stress test    s/p cardiac cath 10/2013 - nonobstructive CAD with normal LV function  . CKD (chronic kidney disease)   . COPD (chronic obstructive pulmonary disease) ([place])   . Dyspnea on exertion   . Emphysema lung (  [facility])   . GERD (gastroesophageal reflux disease)   . Gout   . HTN (hypertension)   . Hyperlipidemia     Tobacco Use: History  Smoking Status  . Former Smoker  . Packs/day: 3.00  . Years: 30.00  . Types: Cigarettes  . Quit date: 09/03/1989  Smokeless Tobacco  . Never Used    Labs: Recent Review Flowsheet Data    There is no flowsheet data to display.      Capillary Blood Glucose: No results found for: GLUCAP   Pulmonary Assessment Scores:     Pulmonary Assessment Scores    Row Name 05/31/17 0901 05/31/17 1627       ADL UCSD   ADL Phase Entry Entry    SOB Score total 93  -      CAT Score   CAT Score 23  Entry  -      mMRC Score   mMRC Score  - 2       Pulmonary Function Assessment:     Pulmonary Function Assessment - 05/28/17  1502      Breath   Bilateral Breath Sounds Decreased   Shortness of Breath Yes;Limiting activity      Exercise Target Goals:    Exercise Program Goal: Individual exercise prescription set with THRR, safety & activity barriers. Participant demonstrates ability to understand and report RPE using BORG scale, to self-measure pulse accurately, and to acknowledge the importance of the exercise prescription.  Exercise Prescription Goal: Starting with aerobic activity 30 plus minutes a day, 3 days per week for initial exercise prescription. Provide home exercise prescription and guidelines that participant acknowledges understanding prior to discharge.  Activity Barriers & Risk Stratification:     Activity Barriers & Cardiac Risk Stratification - 05/28/17 1447      Activity Barriers & Cardiac Risk Stratification   Activity Barriers Deconditioning;Shortness of Breath      6 Minute Walk:     6 Minute Walk    Row Name 05/31/17 1622         6 Minute Walk   Phase Initial     Distance 400 feet     Walk Time -  2 minutes 55 seconds     # of Rest Breaks -  2 (3 minutes 5 seconds total)     MPH 0.75     METS 1.09     RPE 11     Perceived Dyspnea  1     Symptoms Yes (comment)     Comments WHEELCHAIR     Resting HR 107 bpm     Resting BP 125/83     Max Ex. HR 118 bpm     Max Ex. BP 135/78       Interval HR   Baseline HR (retired) 107     1 Minute HR 116     2 Minute HR 116     3 Minute HR 111     4 Minute HR 118     5 Minute HR 116     6 Minute HR 113     2 Minute Post HR 103     Interval Heart Rate? Yes       Interval Oxygen   Interval Oxygen? Yes     Baseline Oxygen Saturation % 91 %     Resting Liters of Oxygen 4 L     1 Minute Oxygen Saturation % 81 %     1 Minute Liters of Oxygen 4 L  2 Minute Oxygen Saturation % 82 %     2 Minute Liters of Oxygen 4 L     3 Minute Oxygen Saturation % 88 %     3 Minute Liters of Oxygen 4 L     4 Minute Oxygen Saturation %  85 %     4 Minute Liters of Oxygen 4 L     5 Minute Oxygen Saturation % 81 %     5 Minute Liters of Oxygen 4 L     6 Minute Oxygen Saturation % 87 %     6 Minute Liters of Oxygen 4 L     2 Minute Post Oxygen Saturation % 91 %     2 Minute Post Liters of Oxygen 4 L        Oxygen Initial Assessment:     Oxygen Initial Assessment - 05/31/17 1611      Initial 6 min Walk   Oxygen Used Continuous;E-Tanks   Liters per minute 4   Resting Oxygen Saturation  91 %   Exercise Oxygen Saturation  during 6 min walk 81 %     Program Oxygen Prescription   Program Oxygen Prescription Continuous   Liters per minute --  unsure since emphysema will contact Dr. Lamonte Sakai for further guidance      Oxygen Re-Evaluation:     Oxygen Re-Evaluation    Row Name 06/13/17 1816 07/06/17 1219 08/05/17 2118         Program Oxygen Prescription   Program Oxygen Prescription Continuous Continuous Continuous     Liters per minute 4 10 10      Comments he does desaturate on 4 liters and requires rest breaks while walking the track. will consult with MD regarding intolerance with ambulation on 4 liters  - MD has approved increase liter flow to keep saturations >88%. continuing to work with MD office to secure patient appropriate oxygen equiptment for home exercise use       Home Oxygen   Home Oxygen Device Home Concentrator;E-Tanks Home Concentrator;E-Tanks Home Concentrator;E-Tanks     Sleep Oxygen Prescription Continuous Continuous Continuous     Liters per minute 3 3 3      Home Exercise Oxygen Prescription Pulsed  patient only has pulsed regulator on portable tank Pulsed  this is not adequate for home exercise and will work with MD to make sure patient has sufficient oxygen to exercise.  -     Liters per minute 4  -  -     Home at Rest Exercise Oxygen Prescription Continuous Continuous Continuous     Liters per minute  -  - 3     Compliance with Home Oxygen Use Yes Yes Yes       Goals/Expected Outcomes    Short Term Goals To learn and exhibit compliance with exercise, home and travel O2 prescription;To learn and understand importance of monitoring SPO2 with pulse oximeter and demonstrate accurate use of the pulse oximeter.;To learn and understand importance of maintaining oxygen saturations>88%;To learn and demonstrate proper pursed lip breathing techniques or other breathing techniques.;To learn and demonstrate proper use of respiratory medications To learn and exhibit compliance with exercise, home and travel O2 prescription;To learn and understand importance of monitoring SPO2 with pulse oximeter and demonstrate accurate use of the pulse oximeter.;To learn and understand importance of maintaining oxygen saturations>88%;To learn and demonstrate proper pursed lip breathing techniques or other breathing techniques.;To learn and demonstrate proper use of respiratory medications To learn and exhibit compliance with exercise,  home and travel O2 prescription;To learn and understand importance of monitoring SPO2 with pulse oximeter and demonstrate accurate use of the pulse oximeter.;To learn and understand importance of maintaining oxygen saturations>88%;To learn and demonstrate proper pursed lip breathing techniques or other breathing techniques.;To learn and demonstrate proper use of respiratory medications     Long  Term Goals Exhibits compliance with exercise, home and travel O2 prescription;Verbalizes importance of monitoring SPO2 with pulse oximeter and return demonstration;Maintenance of O2 saturations>88%;Compliance with respiratory medication;Exhibits proper breathing techniques, such as pursed lip breathing or other method taught during program session  - Exhibits compliance with exercise, home and travel O2 prescription;Verbalizes importance of monitoring SPO2 with pulse oximeter and return demonstration;Maintenance of O2 saturations>88%;Compliance with respiratory medication;Exhibits proper breathing  techniques, such as pursed lip breathing or other method taught during program session     Comments  - patient is compliant with home o2 prescription patient is compliant with home o2 prescription     Goals/Expected Outcomes  - see above goals see above goals        Oxygen Discharge (Final Oxygen Re-Evaluation):     Oxygen Re-Evaluation - 08/05/17 2118      Program Oxygen Prescription   Program Oxygen Prescription Continuous   Liters per minute 10   Comments MD has approved increase liter flow to keep saturations >88%. continuing to work with MD office to secure patient appropriate oxygen equiptment for home exercise use     Home Oxygen   Home Oxygen Device Home Concentrator;E-Tanks   Sleep Oxygen Prescription Continuous   Liters per minute 3   Home at Rest Exercise Oxygen Prescription Continuous   Liters per minute 3   Compliance with Home Oxygen Use Yes     Goals/Expected Outcomes   Short Term Goals To learn and exhibit compliance with exercise, home and travel O2 prescription;To learn and understand importance of monitoring SPO2 with pulse oximeter and demonstrate accurate use of the pulse oximeter.;To learn and understand importance of maintaining oxygen saturations>88%;To learn and demonstrate proper pursed lip breathing techniques or other breathing techniques.;To learn and demonstrate proper use of respiratory medications   Long  Term Goals Exhibits compliance with exercise, home and travel O2 prescription;Verbalizes importance of monitoring SPO2 with pulse oximeter and return demonstration;Maintenance of O2 saturations>88%;Compliance with respiratory medication;Exhibits proper breathing techniques, such as pursed lip breathing or other method taught during program session   Comments patient is compliant with home o2 prescription   Goals/Expected Outcomes see above goals      Initial Exercise Prescription:     Initial Exercise Prescription - 05/31/17 1600      Date of  Initial Exercise RX and Referring Provider   Date 05/31/17   Referring Provider Dr. Lamonte Sakai     Oxygen   Oxygen Continuous   Liters 4  4     NuStep   Level 1   Minutes 34   METs 1.2     Arm Ergometer   Level 1   Minutes 17     Prescription Details   Frequency (times per week) 2   Duration Progress to 45 minutes of aerobic exercise without signs/symptoms of physical distress     Intensity   THRR 40-80% of Max Heartrate 56-113   Ratings of Perceived Exertion 11-13   Perceived Dyspnea 0-4     Resistance Training   Training Prescription Yes   Weight blue bands   Reps 10-15      Perform Capillary Blood Glucose checks as needed.  Exercise Prescription Changes:     Exercise Prescription Changes    Row Name 06/07/17 1200 06/12/17 1200 06/14/17 1256 06/19/17 1224 06/21/17 1200     Response to Exercise   Blood Pressure (Admit) 142/70 112/60 110/62 104/68 112/60   Blood Pressure (Exercise) 126/62 138/62 140/80 110/60 116/50   Blood Pressure (Exit) 102/62 106/60 114/62 112/70 96/60   Heart Rate (Admit) 102 bpm 95 bpm 83 bpm 109 bpm 98 bpm   Heart Rate (Exercise) 107 bpm 100 bpm 106 bpm 119 bpm 102 bpm   Heart Rate (Exit) 84 bpm 80 bpm 85 bpm 104 bpm 83 bpm   Oxygen Saturation (Admit) 98 % 95 % 97 % 91 % 95 %   Oxygen Saturation (Exercise) 89 % 93 % 79 % 89 % 90 %   Oxygen Saturation (Exit) 97 % 98 % 98 % 99 % 100 %   Rating of Perceived Exertion (Exercise) 13 12 15 15 13    Perceived Dyspnea (Exercise) 2 1 4 3 2    Duration Progress to 45 minutes of aerobic exercise without signs/symptoms of physical distress Progress to 45 minutes of aerobic exercise without signs/symptoms of physical distress Progress to 45 minutes of aerobic exercise without signs/symptoms of physical distress Progress to 45 minutes of aerobic exercise without signs/symptoms of physical distress Progress to 45 minutes of aerobic exercise without signs/symptoms of physical distress   Intensity Other (comment)   40-80% of HRR Other (comment)  40-80% of HRR Other (comment)  40-80% of HRR Other (comment)  40-80% of HRR THRR unchanged     Progression   Progression Continue to progress workloads to maintain intensity without signs/symptoms of physical distress. Continue to progress workloads to maintain intensity without signs/symptoms of physical distress. Continue to progress workloads to maintain intensity without signs/symptoms of physical distress. Continue to progress workloads to maintain intensity without signs/symptoms of physical distress. Continue to progress workloads to maintain intensity without signs/symptoms of physical distress.     Resistance Training   Training Prescription Yes Yes Yes Yes Yes   Weight blue bands blue bands blue bands blue bands blue bands   Reps 10-15 10-15 10-15 10-15 10-15   Time 10 Minutes 10 Minutes 10 Minutes 10 Minutes 10 Minutes     Oxygen   Oxygen Continuous Continuous Continuous Continuous Continuous   Liters 4 4 4 4  4-6     NuStep   Level 2 2 2 3 3    Minutes 17 34 17 17 17    METs 2.1 2 2.1 2.4 2.7     Arm Ergometer   Level 1 2  - 2 3   Minutes 17 17  - 17 17     Track   Laps  -  - 5 8  -   Minutes  -  - 17 17  -   Row Name 06/26/17 1200 06/28/17 1200 07/03/17 1632 07/05/17 1200 07/10/17 1200     Response to Exercise   Blood Pressure (Admit) 110/60 120/64 120/62 120/64 119/64   Blood Pressure (Exercise) 122/64 128/70 118/80 120/80 104/62   Blood Pressure (Exit) 102/60 102/62 100/60 106/64 90/50   Heart Rate (Admit) 101 bpm 108 bpm 103 bpm 88 bpm 98 bpm   Heart Rate (Exercise) 106 bpm 118 bpm 116 bpm 110 bpm 116 bpm   Heart Rate (Exit) 85 bpm 85 bpm 95 bpm 85 bpm 100 bpm   Oxygen Saturation (Admit) 93 % 90 % 95 % 97 % 94 %   Oxygen Saturation (  Exercise) 83 % 83 %  4 liters increased to 8 liters 85 %  desaturated on 10L 87 %  desaturated on 10L 88 %   Oxygen Saturation (Exit) 98 % 100 % 85 % 97 % 99 %   Rating of Perceived Exertion  (Exercise) 13 13 13 13 14    Perceived Dyspnea (Exercise) 2 3 3 4 3    Duration Progress to 45 minutes of aerobic exercise without signs/symptoms of physical distress Progress to 45 minutes of aerobic exercise without signs/symptoms of physical distress Progress to 45 minutes of aerobic exercise without signs/symptoms of physical distress Progress to 45 minutes of aerobic exercise without signs/symptoms of physical distress Progress to 45 minutes of aerobic exercise without signs/symptoms of physical distress   Intensity THRR unchanged THRR unchanged THRR unchanged THRR unchanged THRR unchanged     Progression   Progression Continue to progress workloads to maintain intensity without signs/symptoms of physical distress. Continue to progress workloads to maintain intensity without signs/symptoms of physical distress. Continue to progress workloads to maintain intensity without signs/symptoms of physical distress. Continue to progress workloads to maintain intensity without signs/symptoms of physical distress. Continue to progress workloads to maintain intensity without signs/symptoms of physical distress.     Resistance Training   Training Prescription Yes Yes Yes Yes Yes   Weight blue bands blue bands blue bands blue bands blue bands   Reps 10-15 10-15 10-15 10-15 10-15   Time 10 Minutes 10 Minutes 10 Minutes 10 Minutes 10 Minutes     Oxygen   Oxygen Continuous Continuous Continuous Continuous Continuous   Liters 4-6 4-6 10 10 10      NuStep   Level 3 4 4 4 5    Minutes 17 17 17 17 17    METs  - 2.4 2.8 2.6 2.2     Arm Ergometer   Level  - 3 3  - 4   Minutes  - 17 17  - 17     Track   Laps 7 5 6 6 9    Minutes 17 17 17 17 17    Row Name 07/12/17 1222 07/19/17 1300 07/24/17 1200 07/31/17 1200 08/02/17 1200     Response to Exercise   Blood Pressure (Admit) 118/56 106/58 130/74 120/68 142/70   Blood Pressure (Exercise) 104/58 118/62 150/82 128/80 144/70   Blood Pressure (Exit) 104/60 96/64  104/70 108/60 110/72   Heart Rate (Admit) 92 bpm 92 bpm 104 bpm 93 bpm 97 bpm   Heart Rate (Exercise) 107 bpm 109 bpm 1256 bpm 114 bpm 117 bpm   Heart Rate (Exit) 85 bpm 69 bpm 102 bpm 93 bpm 95 bpm   Oxygen Saturation (Admit) 97 % 94 % 94 % 99 % 92 %   Oxygen Saturation (Exercise) 85 % 87 % 89 % 85 % 81 %  10 liters 89% 15 liters   Oxygen Saturation (Exit) 99 % 93 % 97 % 87 % 98 %   Rating of Perceived Exertion (Exercise) 13 13 14 13 14    Perceived Dyspnea (Exercise) 3 2 2 3 4    Duration Progress to 45 minutes of aerobic exercise without signs/symptoms of physical distress Progress to 45 minutes of aerobic exercise without signs/symptoms of physical distress Progress to 45 minutes of aerobic exercise without signs/symptoms of physical distress Progress to 45 minutes of aerobic exercise without signs/symptoms of physical distress Progress to 45 minutes of aerobic exercise without signs/symptoms of physical distress   Intensity THRR unchanged THRR unchanged THRR unchanged THRR unchanged THRR  unchanged     Progression   Progression Continue to progress workloads to maintain intensity without signs/symptoms of physical distress. Continue to progress workloads to maintain intensity without signs/symptoms of physical distress. Continue to progress workloads to maintain intensity without signs/symptoms of physical distress. Continue to progress workloads to maintain intensity without signs/symptoms of physical distress. Continue to progress workloads to maintain intensity without signs/symptoms of physical distress.     Resistance Training   Training Prescription Yes Yes Yes Yes Yes   Weight blue bands blue bands blue bands blue bands blue bands   Reps 10-15 10-15 10-15 10-15 10-15   Time 10 Minutes 10 Minutes 10 Minutes 10 Minutes 10 Minutes     Oxygen   Oxygen Continuous Continuous  - Continuous Continuous   Liters 10 10  - 10 10     NuStep   Level 6 6 6 6   -   Minutes 17 17 17 17   -   METs  3.1 2.7 2.5 2.8  -     Arm Ergometer   Level 4 4 6 6 6    Minutes 17 17 17 17 17      Track   Laps  -  -  - 7 8   Minutes  -  -  - 17 17      Exercise Comments:   Exercise Goals and Review:     Exercise Goals    Row Name 05/28/17 1447             Exercise Goals   Increase Physical Activity Yes       Intervention Provide advice, education, support and counseling about physical activity/exercise needs.;Develop an individualized exercise prescription for aerobic and resistive training based on initial evaluation findings, risk stratification, comorbidities and participant's personal goals.       Expected Outcomes Achievement of increased cardiorespiratory fitness and enhanced flexibility, muscular endurance and strength shown through measurements of functional capacity and personal statement of participant.       Increase Strength and Stamina Yes       Intervention Provide advice, education, support and counseling about physical activity/exercise needs.;Develop an individualized exercise prescription for aerobic and resistive training based on initial evaluation findings, risk stratification, comorbidities and participant's personal goals.       Expected Outcomes Achievement of increased cardiorespiratory fitness and enhanced flexibility, muscular endurance and strength shown through measurements of functional capacity and personal statement of participant.          Exercise Goals Re-Evaluation :     Exercise Goals Re-Evaluation    Row Name 06/12/17 0830 07/02/17 1152 08/06/17 1005         Exercise Goal Re-Evaluation   Exercise Goals Review Increase Physical Activity;Increase Strength and Stamina;Able to understand and use Dyspnea scale;Able to understand and use rate of perceived exertion (RPE) scale;Knowledge and understanding of Target Heart Rate Range (THRR);Understanding of Exercise Prescription Increase Strength and Stamina;Increase Physical Activity;Able to understand  and use Dyspnea scale;Able to understand and use rate of perceived exertion (RPE) scale;Knowledge and understanding of Target Heart Rate Range (THRR);Understanding of Exercise Prescription Increase Physical Activity;Increase Strength and Stamina;Able to understand and use Dyspnea scale;Able to understand and use rate of perceived exertion (RPE) scale;Knowledge and understanding of Target Heart Rate Range (THRR);Understanding of Exercise Prescription     Comments Patient has only attended one exercise session. Will cont to progress as able.  Patient is progressing slow and steady in program. Patient has been desaturating on 8 liters. Permission  has been granted from Dr. Lamonte Sakai to increase liter flow to titrate above 88%. Will cont to monitor and progress as able.  Patient is progressing slow and steady in program. When he tries to push himself his oxygen saturation decreases to unsafe levels even on 10 liters. We have titrated him to 15 liters. Oxymizer pendant has been ordered. Motivated to work hard and make changes although he is limited by his shortness of breath and deconditioning.      Expected Outcomes Through exercise and education at rehab and at home, patient will increase strength and stamina. Patient will also gain a better understanding of the need for physical activity on a daily basis and the effects it can have on quality of life. Through exercise and education at rehab and at home, patient will increase strength and stamina. Patient will also gain a better understanding of the need for physical activity on a daily basis and the effects it can have on quality of life. Through exercise and education at rehab and at home, patient will increase strength and stamina. Patient will also gain a better understanding of the need for physical activity on a daily basis and the effects it can have on quality of life.        Discharge Exercise Prescription (Final Exercise Prescription Changes):      Exercise Prescription Changes - 08/02/17 1200      Response to Exercise   Blood Pressure (Admit) 142/70   Blood Pressure (Exercise) 144/70   Blood Pressure (Exit) 110/72   Heart Rate (Admit) 97 bpm   Heart Rate (Exercise) 117 bpm   Heart Rate (Exit) 95 bpm   Oxygen Saturation (Admit) 92 %   Oxygen Saturation (Exercise) 81 %  10 liters 89% 15 liters   Oxygen Saturation (Exit) 98 %   Rating of Perceived Exertion (Exercise) 14   Perceived Dyspnea (Exercise) 4   Duration Progress to 45 minutes of aerobic exercise without signs/symptoms of physical distress   Intensity THRR unchanged     Progression   Progression Continue to progress workloads to maintain intensity without signs/symptoms of physical distress.     Resistance Training   Training Prescription Yes   Weight blue bands   Reps 10-15   Time 10 Minutes     Oxygen   Oxygen Continuous   Liters 10     Arm Ergometer   Level 6   Minutes 17     Track   Laps 8   Minutes 17      Nutrition:  Target Goals: Understanding of nutrition guidelines, daily intake of sodium <1521m, cholesterol <2023m calories 30% from fat and 7% or less from saturated fats, daily to have 5 or more servings of fruits and vegetables.  Biometrics:     Pre Biometrics - 05/28/17 1509      Pre Biometrics   Grip Strength 36 kg       Nutrition Therapy Plan and Nutrition Goals:   Nutrition Discharge: Rate Your Plate Scores:   Nutrition Goals Re-Evaluation:   Nutrition Goals Discharge (Final Nutrition Goals Re-Evaluation):   Psychosocial: Target Goals: Acknowledge presence or absence of significant depression and/or stress, maximize coping skills, provide positive support system. Participant is able to verbalize types and ability to use techniques and skills needed for reducing stress and depression.  Initial Review & Psychosocial Screening:     Initial Psych Review & Screening - 05/28/17 1503      Initial Review   Current  issues with  None Identified     Family Dynamics   Good Support System? Yes     Barriers   Psychosocial barriers to participate in program There are no identifiable barriers or psychosocial needs.     Screening Interventions   Interventions Encouraged to exercise      Quality of Life Scores:   PHQ-9: Recent Review Flowsheet Data    Depression screen [facility] 2/9 05/28/2017   Decreased Interest 0   Down, Depressed, Hopeless 0   PHQ - 2 Score 0   Altered sleeping 0   Tired, decreased energy 0   Change in appetite 0   Feeling bad or failure about yourself  0   Trouble concentrating 0   Moving slowly or fidgety/restless 0   Suicidal thoughts 0   PHQ-9 Score 0   Difficult doing work/chores Not difficult at all     Interpretation of Total Score  Total Score Depression Severity:  1-4 = Minimal depression, 5-9 = Mild depression, 10-14 = Moderate depression, 15-19 = Moderately severe depression, 20-27 = Severe depression   Psychosocial Evaluation and Intervention:     Psychosocial Evaluation - 05/28/17 1506      Psychosocial Evaluation & Interventions   Interventions Encouraged to exercise with the program and follow exercise prescription   Expected Outcomes patient will remain free from psychosocial barriers to participation   Continue Psychosocial Services  No Follow up required      Psychosocial Re-Evaluation:     Psychosocial Re-Evaluation    [place] Name 06/13/17 [place] 07/06/17 1223 08/05/17 2122         Psychosocial Re-Evaluation   Current issues with None Identified None Identified None Identified     Expected Outcomes patient will remain free from psychosocial barriers to participation in pulmonary rehab  patient will remain free from psychosocial barriers to participation in pulmonary rehab  patient will remain free from psychosocial barriers to participation in pulmonary rehab      Continue Psychosocial Services  No Follow up required No Follow up required No Follow  up required        Psychosocial Discharge (Final Psychosocial Re-Evaluation):     Psychosocial Re-Evaluation - 08/05/17 2122      Psychosocial Re-Evaluation   Current issues with None Identified   Expected Outcomes patient will remain free from psychosocial barriers to participation in pulmonary rehab    Continue Psychosocial Services  No Follow up required      Education: Education Goals: Education classes will be provided on a weekly basis, covering required topics. Participant will state understanding/return demonstration of topics presented.  Learning Barriers/Preferences:     Learning Barriers/Preferences - 05/28/17 1502      Learning Barriers/Preferences   Learning Barriers None   Learning Preferences Computer/Internet;Group Instruction;Individual Instruction;Written Material      Education Topics: Risk Factor Reduction:  -Group instruction that is supported by a PowerPoint presentation. Instructor discusses the definition of a risk factor, different risk factors for pulmonary disease, and how the heart and lungs work together.     Nutrition for Pulmonary Patient:  -Group instruction provided by PowerPoint slides, verbal discussion, and written materials to support subject matter. The instructor gives an explanation and review of healthy diet recommendations, which includes a discussion on weight management, recommendations for fruit and vegetable consumption, as well as protein, fluid, caffeine, fiber, sodium, sugar, and alcohol. Tips for eating when patients are short of breath are discussed.   PULMONARY REHAB OTHER RESPIRATORY from 08/02/2017 in [facility]  CARDIAC REHAB  Date  08/02/17  Educator  Nutritionist  Instruction Review Code  2- meets goals/outcomes      Pursed Lip Breathing:  -Group instruction that is supported by demonstration and informational handouts. Instructor discusses the benefits of pursed lip and diaphragmatic breathing and  detailed demonstration on how to preform both.     Oxygen Safety:  -Group instruction provided by PowerPoint, verbal discussion, and written material to support subject matter. There is an overview of "What is Oxygen" and "Why do we need it".  Instructor also reviews how to create a safe environment for oxygen use, the importance of using oxygen as prescribed, and the risks of noncompliance. There is a brief discussion on traveling with oxygen and resources the patient may utilize.   Oxygen Equipment:  -Group instruction provided by [facility] Staff utilizing handouts, written materials, and equipment demonstrations.   Signs and Symptoms:  -Group instruction provided by written material and verbal discussion to support subject matter. Warning signs and symptoms of infection, stroke, and heart attack are reviewed and when to call the physician/911 reinforced. Tips for preventing the spread of infection discussed.   PULMONARY REHAB OTHER RESPIRATORY from 08/02/2017 in [place]  Date  07/05/17  Educator  RN  Instruction Review Code  2- meets goals/outcomes      Advanced Directives:  -Group instruction provided by verbal instruction and written material to support subject matter. Instructor reviews Advanced Directive laws and proper instruction for filling out document.   Pulmonary Video:  -Group video education that reviews the importance of medication and oxygen compliance, exercise, good nutrition, pulmonary hygiene, and pursed lip and diaphragmatic breathing for the pulmonary patient.   PULMONARY REHAB OTHER RESPIRATORY from 08/02/2017 in [place]  Date  07/12/17  Instruction Review Code  2- meets goals/outcomes      Exercise for the Pulmonary Patient:  -Group instruction that is supported by a PowerPoint presentation. Instructor discusses benefits of exercise, core components of exercise, frequency, duration, and  intensity of an exercise routine, importance of utilizing pulse oximetry during exercise, safety while exercising, and options of places to exercise outside of rehab.     PULMONARY REHAB OTHER RESPIRATORY from 08/02/2017 in [place]  Date  06/26/17  Educator  ep  Instruction Review Code  2- meets goals/outcomes      Pulmonary Medications:  -Verbally interactive group education provided by instructor with focus on inhaled medications and proper administration.   PULMONARY REHAB OTHER RESPIRATORY from 08/02/2017 in [place]  Date  06/14/17  Educator  pharmacist  Instruction Review Code  2- meets goals/outcomes      Anatomy and Physiology of the Respiratory System and Intimacy:  -Group instruction provided by PowerPoint, verbal discussion, and written material to support subject matter. Instructor reviews respiratory cycle and anatomical components of the respiratory system and their functions. Instructor also reviews differences in obstructive and restrictive respiratory diseases with examples of each. Intimacy, Sex, and Sexuality differences are reviewed with a discussion on how relationships can change when diagnosed with pulmonary disease. Common sexual concerns are reviewed.   MD DAY -A group question and answer session with a medical doctor that allows participants to ask questions that relate to their pulmonary disease state.   PULMONARY REHAB OTHER RESPIRATORY from 08/02/2017 in [place]  Date  07/24/17  Educator  yacoub  Instruction Review Code  2- meets goals/outcomes      OTHER EDUCATION -Group or individual verbal, written, or video instructions that support the educational goals of the pulmonary rehab program.   Knowledge Questionnaire Score:     Knowledge Questionnaire Score - 05/31/17 0901      Knowledge Questionnaire Score   Pre Score 10/13      Core  Components/Risk Factors/Patient Goals at Admission:     Personal Goals and Risk Factors at Admission - 05/28/17 1503      Core Components/Risk Factors/Patient Goals on Admission   Improve shortness of breath with ADL's Yes   Intervention Provide education, individualized exercise plan and daily activity instruction to help decrease symptoms of SOB with activities of daily living.   Expected Outcomes Short Term: Achieves a reduction of symptoms when performing activities of daily living.   Develop more efficient breathing techniques such as purse lipped breathing and diaphragmatic breathing; and practicing self-pacing with activity Yes   Intervention Provide education, demonstration and support about specific breathing techniuqes utilized for more efficient breathing. Include techniques such as pursed lipped breathing, diaphragmatic breathing and self-pacing activity.   Expected Outcomes Short Term: Participant will be able to demonstrate and use breathing techniques as needed throughout daily activities.   Increase knowledge of respiratory medications and ability to use respiratory devices properly  Yes   Intervention Provide education and demonstration as needed of appropriate use of medications, inhalers, and oxygen therapy.   Expected Outcomes Short Term: Achieves understanding of medications use. Understands that oxygen is a medication prescribed by physician. Demonstrates appropriate use of inhaler and oxygen therapy.      Core Components/Risk Factors/Patient Goals Review:      Goals and Risk Factor Review    Row Name 06/13/17 1819 07/06/17 1222 08/05/17 2120         Core Components/Risk Factors/Patient Goals Review   Personal Goals Review Improve shortness of breath with ADL's;Develop more efficient breathing techniques such as purse lipped breathing and diaphragmatic breathing and practicing self-pacing with activity.;Increase knowledge of respiratory medications and ability to use  respiratory devices properly. Improve shortness of breath with ADL's;Develop more efficient breathing techniques such as purse lipped breathing and diaphragmatic breathing and practicing self-pacing with activity.;Increase knowledge of respiratory medications and ability to use respiratory devices properly. Improve shortness of breath with ADL's;Develop more efficient breathing techniques such as purse lipped breathing and diaphragmatic breathing and practicing self-pacing with activity.;Increase knowledge of respiratory medications and ability to use respiratory devices properly.     Review patient has only attended 2 sessions since admission. will evaluate progression towards goals over the next 30 days patient is making slow progress towards goals related to the initial lack of oxygen orders for exercise. Now have order to maintain saturations 88 or greater with unlimited ammount of oxygen with exertion. He is using plb without cueing and paces himself while taking restbreaks with walking. patient is now making greater progress in pulmonary rehab related to increase oxygen use parameters. He has perfected his PLB technique and is observed using it during all aspects of his exercise sessios.     Expected Outcomes see admission outcomes see admission outcomes see admission outcomes        Core Components/Risk Factors/Patient Goals at Discharge (Final Review):      Goals and Risk Factor Review - 08/05/17 2120      Core Components/Risk Factors/Patient Goals Review   Personal Goals Review Improve shortness of breath with ADL's;Develop more efficient breathing techniques such as  purse lipped breathing and diaphragmatic breathing and practicing self-pacing with activity.;Increase knowledge of respiratory medications and ability to use respiratory devices properly.   Review patient is now making greater progress in pulmonary rehab related to increase oxygen use parameters. He has perfected his PLB technique  and is observed using it during all aspects of his exercise sessios.   Expected Outcomes see admission outcomes      ITP Comments:   Comments: ITP REVIEW Pt is making expected progress toward pulmonary rehab goals after completing 15 sessions. Recommend continued exercise, life style modification, education, and utilization of breathing techniques to increase stamina and strength and decrease shortness of breath with exertion.

## 2017-08-07 ENCOUNTER — Encounter (HOSPITAL_COMMUNITY)
Admission: RE | Admit: 2017-08-07 | Discharge: 2017-08-07 | Disposition: A | Discharge status: Home / Self Care | Payer: PPO | Source: Ambulatory Visit | Attending: Emergency Medicine | Admitting: Emergency Medicine

## 2017-08-07 VITALS — Wt 183.4 lb

## 2017-08-07 DIAGNOSIS — J439 Emphysema, unspecified: Secondary | ICD-10-CM | POA: Diagnosis not present

## 2017-08-07 NOTE — Progress Notes (Signed)
Daily Session Note  Patient Details  Name: Charles Archer MRN: [id number] Date of Birth: 06-03-1936 Referring Provider:     Pulmonary Rehab Walk Test from 05/31/2017 in [place]  Referring Provider  Dr. Lamonte Sakai      Encounter Date: 08/07/2017  Check In:     Session Check In - 08/07/17 1030      Check-In   Location [facility]   Staff Present Rosebud Poles, RN, BSN;Meredith Kilbride, MS, ACSM RCEP, Exercise Physiologist;Portia Rollene Rotunda, RN, Roque Cash, RN   Supervising physician immediately available to respond to emergencies [facility] immediately available   Physician(s) Dr. Horris Latino   Medication changes reported     No   Fall or balance concerns reported    No   Tobacco Cessation No Change   Warm-up and Cool-down Performed as group-led instruction   Resistance Training Performed Yes   VAD Patient? No     Pain Assessment   Currently in Pain? No/denies   Multiple Pain Sites No      Capillary Blood Glucose: No results found for this or any previous visit (from the past 24 hour(s)).      Exercise Prescription Changes - 08/07/17 1200      Response to Exercise   Blood Pressure (Admit) 118/62   Blood Pressure (Exercise) 110/64   Blood Pressure (Exit) 98/62   Heart Rate (Admit) 91 bpm   Heart Rate (Exercise) 112 bpm   Heart Rate (Exit) 106 bpm   Oxygen Saturation (Admit) 98 %   Oxygen Saturation (Exercise) 88 %  10 liters 89% 15 liters   Oxygen Saturation (Exit) 97 %   Rating of Perceived Exertion (Exercise) 15   Perceived Dyspnea (Exercise) 4   Duration Progress to 45 minutes of aerobic exercise without signs/symptoms of physical distress   Intensity THRR unchanged     Progression   Progression Continue to progress workloads to maintain intensity without signs/symptoms of physical distress.     Resistance Training   Training Prescription Yes   Weight blue bands   Reps 10-15   Time 10 Minutes     Oxygen   Oxygen Continuous   Liters 10     NuStep   Level 6   Minutes 17   METs 2.7     Arm Ergometer   Level 6   Minutes 17     Track   Laps 8   Minutes 17      History  Smoking Status  . Former Smoker  . Packs/day: 3.00  . Years: 30.00  . Types: Cigarettes  . Quit date: 09/03/1989  Smokeless Tobacco  . Never Used    Goals Met:  Exercise tolerated well No report of cardiac concerns or symptoms Strength training completed today  Goals Unmet:  Not Applicable  Comments: Service time is from 10:30A to 12:00P    Dr. Rush Farmer is Medical Director for Pulmonary Rehab at [facility].

## 2017-08-08 ENCOUNTER — Ambulatory Visit (INDEPENDENT_AMBULATORY_CARE_PROVIDER_SITE_OTHER): Payer: PPO | Admitting: Emergency Medicine

## 2017-08-08 ENCOUNTER — Encounter: Payer: Self-pay | Admitting: Emergency Medicine

## 2017-08-08 DIAGNOSIS — J449 Chronic obstructive pulmonary disease, unspecified: Secondary | ICD-10-CM

## 2017-08-08 DIAGNOSIS — J301 Allergic rhinitis due to pollen: Secondary | ICD-10-CM

## 2017-08-08 DIAGNOSIS — R911 Solitary pulmonary nodule: Secondary | ICD-10-CM | POA: Diagnosis not present

## 2017-08-08 DIAGNOSIS — Z23 Encounter for immunization: Secondary | ICD-10-CM

## 2017-08-08 MED ORDER — ACAPELLA MISC: 0 refills | Status: AC

## 2017-08-08 NOTE — Patient Instructions (Addendum)
Please continue DuoNeb as scheduled.  Take albuterol 2 puffs up to every 4 hours if needed for shortness of breath.  Continue your oxygen at 3-4l/min.  Flu shot today Pneumovax today Your Ct scan is stable. We will repeat in 6 months without contrast to follow pulmonary nodule.  Try using a flutter 1 -2 x a day to see if this helps clear chest mucous.  Follow with Dr Delton CoombesByrum in 6 months or sooner if you have any problems

## 2017-08-08 NOTE — Assessment & Plan Note (Signed)
Fluticasone nasal spray

## 2017-08-08 NOTE — Assessment & Plan Note (Signed)
Lingular nodule smaller.  Repeat CT in 6 months

## 2017-08-08 NOTE — Assessment & Plan Note (Signed)
Please continue DuoNeb as scheduled.  Take albuterol 2 puffs up to every 4 hours if needed for shortness of breath.  Continue your oxygen at 3-4l/min.  Flu shot today Pneumovax today Try using a flutter 1 -2 x a day to see if this helps clear chest mucous.  Follow with Dr Delton CoombesByrum in 6 months or sooner if you have any problems

## 2017-08-08 NOTE — Progress Notes (Signed)
Subjective:    Patient ID: Charles Archer, male    DOB: 12-31-1935, 81 y.o.   MRN: [id number]  HPI 81 yo former smoker (90 pk-yrs), hx of HTN, hyperlipemia. [facility]. Stress testing and L heart cath done 09/23/13 and 10/22/13 > non-critical CAD, overall reassuring.  Also history GERD, rhinitis  PFT 12/28/13  >> moderate AFL, no BD response, normal volumes, significantly decreased DLCO.  No new sx, still with exertional SOB. No flares. Last time exertional hypoxemia.    ROV 02/15/17 -- Patient has a history of COPD with associated chronic hypoxemic respiratory failure, allergic rhinitis, chronic cough. He was seen 01/19/17 after being treated for an acute exacerbation. He was found to have a left lung density, subsequent CT scan of the chest 01/17/17 showed a wedge-shaped area of possible rounded atelectasis. He underwent chest x-ray today that I have reviewed. This shows stable L lateral -basilar change compared with last 2 month. He is still building up his exercise tolerance. He desaturates on 3L/min pulsed. Currently on Advair, feels that he benefits from it. No longer on a LAMA.   ROV 05/08/17 -- This follow-up visit for patient with a history of COPD, allergic rhinitis and chronic cough, chronic hypoxemic respiratory failure. He has an area of rounded density on serial CT scans of the chest consistent with probable rounded atelectasis. At his last visit we continued Advair, started DuoNeb on a schedule 3 times a day. His oxygen is at 3-4 L/m. He has benefited from the DuoNeb.  He does still cough some. He is using mucinex, thick mucous.   ROV 08/08/17 --this is a follow-up visit.  81 year old gentleman with COPD, allergic rhinitis, GERD, chronic cough.  He has chronic hypoxemic respiratory failure, current oxygen need is 3-4 L/min.  Also with an area of rounded atelectasis that we have been following by CT scan.  Most recent scan 07/17/17 that I have personally  reviewed.  This shows extensive emphysematous change, rounded irregular opacity abutting the pleura in the inferior lingula that is slightly smaller than on the previous study from 01/17/17. He is doing pulmonary rehab with some benefit. He is on DuoNeb qid. He is having a lot of congestion, sputum production, clear. He is on flonase.    Review of Systems  Constitutional: Negative for fever and unexpected weight change.  HENT: Positive for congestion. Negative for dental problem, ear pain, nosebleeds, postnasal drip, rhinorrhea, sinus pressure, sneezing, sore throat and trouble swallowing.   Eyes: Negative for redness and itching.  Respiratory: Positive for cough. Negative for chest tightness, shortness of breath and wheezing.   Cardiovascular: Negative for palpitations and leg swelling.  Gastrointestinal: Negative for abdominal distention, nausea and vomiting.  Genitourinary: Negative for dysuria.  Musculoskeletal: Negative for joint swelling.  Skin: Negative for rash.  Neurological: Negative for headaches.  Hematological: Does not bruise/bleed easily.  Psychiatric/Behavioral: Negative for dysphoric mood. The patient is not nervous/anxious.        Objective:   Physical Exam Vitals:   08/08/17 1359  BP: 124/76  Pulse: 98  SpO2: 91%  Weight: 183 lb (83 kg)  Height: 5\' 11"  (1.803 m)   Gen: Pleasant, well-nourished, in no distress,  normal affect  ENT: No lesions,  mouth clear,  oropharynx clear, no postnasal drip  Neck: No JVD, no TMG, no carotid bruits  Lungs: No use of accessory muscles, clear without rales or rhonchi  Cardiovascular: RRR, heart sounds normal, no murmur or  gallops, no peripheral edema  Musculoskeletal: No deformities, no cyanosis or clubbing  Neuro: alert, non focal  Skin: Warm, no lesions or rashes      Assessment & Plan:  COPD (chronic obstructive pulmonary disease) Please continue DuoNeb as scheduled.  Take albuterol 2 puffs up to every 4 hours if  needed for shortness of breath.  Continue your oxygen at 3-4l/min.  Flu shot today Pneumovax today Try using a flutter 1 -2 x a day to see if this helps clear chest mucous.  Follow with Dr Delton Coombes in 6 months or sooner if you have any problems  Allergic rhinitis Fluticasone nasal spray  Lung nodule Lingular nodule smaller.  Repeat CT in 6 months  [name], MD, PhD 08/08/2017, 2:27 PM [facility] [phone] or if no answer [phone]

## 2017-08-09 ENCOUNTER — Encounter (HOSPITAL_COMMUNITY)
Admission: RE | Admit: 2017-08-09 | Discharge: 2017-08-09 | Disposition: A | Discharge status: Home / Self Care | Payer: PPO | Source: Ambulatory Visit | Attending: Emergency Medicine | Admitting: Emergency Medicine

## 2017-08-09 VITALS — Wt 183.2 lb

## 2017-08-09 DIAGNOSIS — J439 Emphysema, unspecified: Secondary | ICD-10-CM

## 2017-08-09 NOTE — Progress Notes (Signed)
Daily Session Note  Patient Details  Name: Charles Archer MRN: [id number] Date of Birth: 11/19/1935 Referring Provider:     Pulmonary Rehab Walk Test from 05/31/2017 in [place]  Referring Provider  Dr. Lamonte Sakai      Encounter Date: 08/09/2017  Check In:     Session Check In - 08/09/17 1059      Check-In   Location [facility]   Staff Present Rodney Langton, RN;Portia Rollene Rotunda, RN, BSN;Molly diVincenzo, MS, ACSM RCEP, Exercise Physiologist;Joan Leonia Reeves, RN, BSN   Supervising physician immediately available to respond to emergencies [facility] immediately available   Physician(s) Dr. Wendee Beavers   Medication changes reported     No   Fall or balance concerns reported    No   Tobacco Cessation No Change   Warm-up and Cool-down Performed as group-led instruction   Resistance Training Performed Yes   VAD Patient? No     Pain Assessment   Currently in Pain? No/denies   Multiple Pain Sites No      Capillary Blood Glucose: No results found for this or any previous visit (from the past 24 hour(s)).      Exercise Prescription Changes - 08/09/17 1200      Response to Exercise   Blood Pressure (Admit) 100/50   Blood Pressure (Exercise) 130/70   Blood Pressure (Exit) 110/70   Heart Rate (Admit) 91 bpm   Heart Rate (Exercise) 117 bpm   Heart Rate (Exit) 80 bpm   Oxygen Saturation (Admit) 97 %   Oxygen Saturation (Exercise) 92 %   Oxygen Saturation (Exit) 97 %   Rating of Perceived Exertion (Exercise) 13   Perceived Dyspnea (Exercise) 3   Duration Progress to 45 minutes of aerobic exercise without signs/symptoms of physical distress   Intensity THRR unchanged     Progression   Progression Continue to progress workloads to maintain intensity without signs/symptoms of physical distress.     Resistance Training   Training Prescription Yes   Weight blue bands   Reps 10-15   Time 10 Minutes     Oxygen   Oxygen Continuous   Liters 10     NuStep   Level 6   Minutes 17   METs 2.5     Track   Laps 8   Minutes 17      History  Smoking Status  . Former Smoker  . Packs/day: 3.00  . Years: 30.00  . Types: Cigarettes  . Quit date: 09/03/1989  Smokeless Tobacco  . Never Used    Goals Met:  No report of cardiac concerns or symptoms Strength training completed today  Goals Unmet:  Not Applicable  Comments: Service time is from 1030 to 1230    Dr. Rush Farmer is Medical Director for Pulmonary Rehab at [facility].

## 2017-08-10 DIAGNOSIS — H25043 Posterior subcapsular polar age-related cataract, bilateral: Secondary | ICD-10-CM | POA: Diagnosis not present

## 2017-08-10 DIAGNOSIS — H5203 Hypermetropia, bilateral: Secondary | ICD-10-CM | POA: Diagnosis not present

## 2017-08-10 DIAGNOSIS — H43811 Vitreous degeneration, right eye: Secondary | ICD-10-CM | POA: Diagnosis not present

## 2017-08-14 ENCOUNTER — Encounter (HOSPITAL_COMMUNITY)
Admission: RE | Admit: 2017-08-14 | Discharge: 2017-08-14 | Disposition: A | Discharge status: Home / Self Care | Payer: PPO | Source: Ambulatory Visit | Attending: Emergency Medicine | Admitting: Emergency Medicine

## 2017-08-14 VITALS — Wt 185.4 lb

## 2017-08-14 DIAGNOSIS — J439 Emphysema, unspecified: Secondary | ICD-10-CM

## 2017-08-14 NOTE — Progress Notes (Signed)
Daily Session Note  Patient Details  Name: Charles Archer MRN: [id number] Date of Birth: 07/09/1936 Referring Provider:     Pulmonary Rehab Walk Test from 05/31/2017 in [place]  Referring Provider  Dr. Lamonte Sakai      Encounter Date: 08/14/2017  Check In:     Session Check In - 08/14/17 1010      Check-In   Location [facility]   Staff Present Rosebud Poles, RN, BSN;Molly diVincenzo, MS, ACSM RCEP, Exercise Physiologist;Lisa Ysidro Evert, RN;Lehua Flores Rollene Rotunda, RN, BSN   Supervising physician immediately available to respond to emergencies [facility] immediately available   Physician(s) Dr. Wendee Beavers   Medication changes reported     No   Fall or balance concerns reported    No   Tobacco Cessation No Change   Warm-up and Cool-down Performed as group-led instruction   Resistance Training Performed Yes   VAD Patient? No     Pain Assessment   Currently in Pain? No/denies   Multiple Pain Sites No      Capillary Blood Glucose: No results found for this or any previous visit (from the past 24 hour(s)).      Exercise Prescription Changes - 08/14/17 1210      Response to Exercise   Blood Pressure (Admit) 104/62   Blood Pressure (Exercise) 124/70   Blood Pressure (Exit) 104/66   Heart Rate (Admit) 87 bpm   Heart Rate (Exercise) 108 bpm   Heart Rate (Exit) 94 bpm   Oxygen Saturation (Admit) 96 %   Oxygen Saturation (Exercise) 86 %   Oxygen Saturation (Exit) 98 %   Rating of Perceived Exertion (Exercise) 11   Perceived Dyspnea (Exercise) 3   Duration Progress to 45 minutes of aerobic exercise without signs/symptoms of physical distress   Intensity THRR unchanged     Progression   Progression Continue to progress workloads to maintain intensity without signs/symptoms of physical distress.     Resistance Training   Training Prescription Yes   Weight blue bands   Reps 10-15   Time 10 Minutes     Oxygen   Oxygen Continuous   Liters 10     NuStep   Level 6   Minutes 17   METs 2.8     Arm Ergometer   Level 6   Minutes 17     Track   Laps 7   Minutes 17      History  Smoking Status  . Former Smoker  . Packs/day: 3.00  . Years: 30.00  . Types: Cigarettes  . Quit date: 09/03/1989  Smokeless Tobacco  . Never Used    Goals Met:  Independence with exercise equipment Improved SOB with ADL's Using PLB without cueing & demonstrates good technique Exercise tolerated well No report of cardiac concerns or symptoms Strength training completed today  Goals Unmet:  Not Applicable  Comments: Service time is from 1030 to 1200   Dr. Rush Farmer is Medical Director for Pulmonary Rehab at [facility].

## 2017-08-16 ENCOUNTER — Encounter (HOSPITAL_COMMUNITY)
Admission: RE | Admit: 2017-08-16 | Discharge: 2017-08-16 | Disposition: A | Discharge status: Home / Self Care | Payer: PPO | Source: Ambulatory Visit | Attending: Emergency Medicine | Admitting: Emergency Medicine

## 2017-08-16 VITALS — Wt 183.6 lb

## 2017-08-16 DIAGNOSIS — E785 Hyperlipidemia, unspecified: Secondary | ICD-10-CM | POA: Insufficient documentation

## 2017-08-16 DIAGNOSIS — Z7951 Long term (current) use of inhaled steroids: Secondary | ICD-10-CM | POA: Insufficient documentation

## 2017-08-16 DIAGNOSIS — Z79899 Other long term (current) drug therapy: Secondary | ICD-10-CM | POA: Diagnosis not present

## 2017-08-16 DIAGNOSIS — Z87891 Personal history of nicotine dependence: Secondary | ICD-10-CM | POA: Insufficient documentation

## 2017-08-16 DIAGNOSIS — K219 Gastro-esophageal reflux disease without esophagitis: Secondary | ICD-10-CM | POA: Insufficient documentation

## 2017-08-16 DIAGNOSIS — M109 Gout, unspecified: Secondary | ICD-10-CM | POA: Diagnosis not present

## 2017-08-16 DIAGNOSIS — I129 Hypertensive chronic kidney disease with stage 1 through stage 4 chronic kidney disease, or unspecified chronic kidney disease: Secondary | ICD-10-CM | POA: Diagnosis not present

## 2017-08-16 DIAGNOSIS — N189 Chronic kidney disease, unspecified: Secondary | ICD-10-CM | POA: Insufficient documentation

## 2017-08-16 DIAGNOSIS — J439 Emphysema, unspecified: Secondary | ICD-10-CM

## 2017-08-16 DIAGNOSIS — Z7982 Long term (current) use of aspirin: Secondary | ICD-10-CM | POA: Diagnosis not present

## 2017-08-16 NOTE — Progress Notes (Signed)
Daily Session Note  Patient Details  Name: Charles Archer MRN: [id number] Date of Birth: Aug 18, 1936 Referring Provider:     Pulmonary Rehab Walk Test from 05/31/2017 in [place]  Referring Provider  Dr. Lamonte Sakai      Encounter Date: 08/16/2017  Check In:     Session Check In - 08/16/17 1030      Check-In   Staff Present Rosebud Poles, RN, BSN;Molly diVincenzo, MS, ACSM RCEP, Exercise Physiologist;Trygve Thal Ysidro Evert, RN;Portia Rollene Rotunda, RN, BSN   Supervising physician immediately available to respond to emergencies [facility] immediately available   Physician(s) Dr. Wynetta Emery   Medication changes reported     No   Fall or balance concerns reported    No   Tobacco Cessation No Change   Warm-up and Cool-down Performed as group-led instruction   Resistance Training Performed Yes   VAD Patient? No     Pain Assessment   Currently in Pain? No/denies   Multiple Pain Sites No      Capillary Blood Glucose: No results found for this or any previous visit (from the past 24 hour(s)).      Exercise Prescription Changes - 08/16/17 1200      Response to Exercise   Blood Pressure (Admit) 112/60   Blood Pressure (Exercise) 126/64   Blood Pressure (Exit) 108/82   Heart Rate (Admit) 91 bpm   Heart Rate (Exercise) 111 bpm   Heart Rate (Exit) 89 bpm   Oxygen Saturation (Admit) 95 %   Oxygen Saturation (Exercise) 90 %   Oxygen Saturation (Exit) 98 %   Rating of Perceived Exertion (Exercise) 14   Perceived Dyspnea (Exercise) 2   Duration Progress to 45 minutes of aerobic exercise without signs/symptoms of physical distress   Intensity THRR unchanged     Progression   Progression Continue to progress workloads to maintain intensity without signs/symptoms of physical distress.     Resistance Training   Training Prescription Yes   Weight blue bands   Reps 10-15   Time 10 Minutes     Oxygen   Oxygen Continuous   Liters 10     Arm Ergometer   Level 6   Minutes 17     Track   Laps 8   Minutes 17      History  Smoking Status  . Former Smoker  . Packs/day: 3.00  . Years: 30.00  . Types: Cigarettes  . Quit date: 09/03/1989  Smokeless Tobacco  . Never Used    Goals Met:  Exercise tolerated well No report of cardiac concerns or symptoms Strength training completed today  Goals Unmet:  Not Applicable  Comments: Service time is from 1030 to 1230    Dr. Rush Farmer is Medical Director for Pulmonary Rehab at [facility].

## 2017-08-21 ENCOUNTER — Encounter (HOSPITAL_COMMUNITY)
Admission: RE | Admit: 2017-08-21 | Discharge: 2017-08-21 | Disposition: A | Discharge status: Home / Self Care | Payer: PPO | Source: Ambulatory Visit | Attending: Emergency Medicine | Admitting: Emergency Medicine

## 2017-08-21 VITALS — Wt 185.4 lb

## 2017-08-21 DIAGNOSIS — J439 Emphysema, unspecified: Secondary | ICD-10-CM | POA: Diagnosis not present

## 2017-08-21 NOTE — Progress Notes (Signed)
Daily Session Note  Patient Details  Name: DAYTEN JUBA MRN: [id number] Date of Birth: Nov 11, 1935 Referring Provider:     Pulmonary Rehab Walk Test from 05/31/2017 in [place]  Referring Provider  Dr. Lamonte Sakai      Encounter Date: 08/21/2017  Check In: Session Check In - 08/21/17 1019      Check-In   Location  [facility]    Staff Present  Su Hilt, MS, ACSM RCEP, Exercise Physiologist;Lisa Ysidro Evert, RN;Joan Leonia Reeves, RN, Luisa Hart, RN, BSN    Supervising physician immediately available to respond to emergencies  [facility] immediately available    Physician(s)  Dr. Broadus John    Medication changes reported      No    Fall or balance concerns reported     No    Tobacco Cessation  No Change    Warm-up and Cool-down  Performed as group-led instruction    Resistance Training Performed  Yes    VAD Patient?  No      Pain Assessment   Currently in Pain?  No/denies    Multiple Pain Sites  No       Capillary Blood Glucose: No results found for this or any previous visit (from the past 24 hour(s)).  Exercise Prescription Changes - 08/21/17 1200      Response to Exercise   Blood Pressure (Admit)  112/70    Blood Pressure (Exercise)  130/60    Blood Pressure (Exit)  108/64    Heart Rate (Admit)  110 bpm    Heart Rate (Exercise)  126 bpm    Heart Rate (Exit)  117 bpm    Oxygen Saturation (Admit)  94 %    Oxygen Saturation (Exercise)  89 %    Oxygen Saturation (Exit)  95 %    Rating of Perceived Exertion (Exercise)  13    Perceived Dyspnea (Exercise)  3    Duration  Progress to 45 minutes of aerobic exercise without signs/symptoms of physical distress    Intensity  THRR unchanged      Progression   Progression  Continue to progress workloads to maintain intensity without signs/symptoms of physical distress.      Resistance Training   Training Prescription  Yes    Weight  blue bands    Reps  10-15    Time  10  Minutes      Oxygen   Oxygen  Continuous    Liters  10      NuStep   Level  6    Minutes  17    METs  2.5      Arm Ergometer   Level  6    Minutes  17      Track   Laps  7    Minutes  17       Social History   Tobacco Use  Smoking Status Former Smoker  . Packs/day: 3.00  . Years: 30.00  . Pack years: 90.00  . Types: Cigarettes  . Last attempt to quit: 09/03/1989  . Years since quitting: 27.9  Smokeless Tobacco Never Used    Goals Met:  Exercise tolerated well No report of cardiac concerns or symptoms Strength training completed today  Goals Unmet:  Not Applicable  Comments: Service time is from 10:30a to 12:10p    Dr. Rush Farmer is Medical Director for Pulmonary Rehab at [facility].

## 2017-08-23 ENCOUNTER — Encounter (HOSPITAL_COMMUNITY)
Admission: RE | Admit: 2017-08-23 | Discharge: 2017-08-23 | Disposition: A | Discharge status: Home / Self Care | Payer: PPO | Source: Ambulatory Visit | Attending: Emergency Medicine | Admitting: Emergency Medicine

## 2017-08-23 DIAGNOSIS — J449 Chronic obstructive pulmonary disease, unspecified: Secondary | ICD-10-CM | POA: Diagnosis not present

## 2017-08-23 DIAGNOSIS — J439 Emphysema, unspecified: Secondary | ICD-10-CM | POA: Diagnosis not present

## 2017-08-23 NOTE — Progress Notes (Signed)
Daily Session Note  Patient Details  Name: Charles Archer MRN: [id number] Date of Birth: 1936-06-20 Referring Provider:     Pulmonary Rehab Walk Test from 05/31/2017 in [place]  Referring Provider  Dr. Lamonte Sakai      Encounter Date: 08/23/2017  Check In:   Capillary Blood Glucose: No results found for this or any previous visit (from the past 24 hour(s)).  Exercise Prescription Changes - 08/23/17 1300      Response to Exercise   Blood Pressure (Admit)  128/70    Blood Pressure (Exercise)  122/70    Blood Pressure (Exit)  108/60    Heart Rate (Admit)  91 bpm    Heart Rate (Exercise)  110 bpm    Heart Rate (Exit)  96 bpm    Oxygen Saturation (Admit)  91 %    Oxygen Saturation (Exercise)  87 %    Oxygen Saturation (Exit)  97 %    Rating of Perceived Exertion (Exercise)  13    Perceived Dyspnea (Exercise)  3    Duration  Progress to 45 minutes of aerobic exercise without signs/symptoms of physical distress    Intensity  THRR unchanged      Progression   Progression  Continue to progress workloads to maintain intensity without signs/symptoms of physical distress.      Resistance Training   Training Prescription  Yes    Weight  blue bands    Reps  10-15    Time  10 Minutes      Oxygen   Oxygen  Continuous    Liters  10      NuStep   Level  6    Minutes  17    METs  2.8      Track   Laps  7    Minutes  17       Social History   Tobacco Use  Smoking Status Former Smoker  . Packs/day: 3.00  . Years: 30.00  . Pack years: 90.00  . Types: Cigarettes  . Last attempt to quit: 09/03/1989  . Years since quitting: 27.9  Smokeless Tobacco Never Used    Goals Met:  Exercise tolerated well No report of cardiac concerns or symptoms Strength training completed today  Goals Unmet:  Not Applicable  Comments: Service time is from 10:30a to 12:30p    Dr. Rush Farmer is Medical Director for Pulmonary Rehab at [facility].

## 2017-08-28 ENCOUNTER — Encounter (HOSPITAL_COMMUNITY)
Admission: RE | Admit: 2017-08-28 | Discharge: 2017-08-28 | Disposition: A | Discharge status: Home / Self Care | Payer: PPO | Source: Ambulatory Visit | Attending: Emergency Medicine | Admitting: Emergency Medicine

## 2017-08-28 VITALS — Wt 183.4 lb

## 2017-08-28 DIAGNOSIS — J439 Emphysema, unspecified: Secondary | ICD-10-CM | POA: Diagnosis not present

## 2017-08-28 DIAGNOSIS — J449 Chronic obstructive pulmonary disease, unspecified: Secondary | ICD-10-CM | POA: Diagnosis not present

## 2017-08-28 NOTE — Progress Notes (Signed)
Daily Session Note  Patient Details  Name: Charles Archer MRN: [id number] Date of Birth: 04/27/1936 Referring Provider:     Pulmonary Rehab Walk Test from 05/31/2017 in [place]  Referring Provider  Dr. Lamonte Sakai      Encounter Date: 08/28/2017  Check In: Session Check In - 08/28/17 1239      Check-In   Location  [facility]    Staff Present  Rodney Langton, RN;Kassidy Frankson, MS, ACSM RCEP, Exercise Physiologist;Joan Leonia Reeves, RN, Luisa Hart, RN, BSN    Supervising physician immediately available to respond to emergencies  [facility] immediately available    Physician(s)  Dr. Maylene Roes    Medication changes reported      No    Fall or balance concerns reported     No    Tobacco Cessation  No Change    Warm-up and Cool-down  Performed as group-led instruction    Resistance Training Performed  Yes    VAD Patient?  No      Pain Assessment   Currently in Pain?  No/denies    Multiple Pain Sites  No       Capillary Blood Glucose: No results found for this or any previous visit (from the past 24 hour(s)).  Exercise Prescription Changes - 08/28/17 1200      Response to Exercise   Blood Pressure (Admit)  104/60    Blood Pressure (Exercise)  110/62    Blood Pressure (Exit)  102/74    Heart Rate (Admit)  102 bpm    Heart Rate (Exercise)  126 bpm    Heart Rate (Exit)  118 bpm    Oxygen Saturation (Admit)  96 %    Oxygen Saturation (Exercise)  87 %    Oxygen Saturation (Exit)  94 %    Rating of Perceived Exertion (Exercise)  17    Perceived Dyspnea (Exercise)  3    Duration  Progress to 45 minutes of aerobic exercise without signs/symptoms of physical distress    Intensity  THRR unchanged      Progression   Progression  Continue to progress workloads to maintain intensity without signs/symptoms of physical distress.      Resistance Training   Training Prescription  Yes    Weight  blue bands    Reps  10-15    Time  10  Minutes      Oxygen   Oxygen  Continuous    Liters  10      NuStep   Level  6    Minutes  17    METs  2.5      Arm Ergometer   Level  6    Minutes  17      Track   Laps  9    Minutes  17       Social History   Tobacco Use  Smoking Status Former Smoker  . Packs/day: 3.00  . Years: 30.00  . Pack years: 90.00  . Types: Cigarettes  . Last attempt to quit: 09/03/1989  . Years since quitting: 28.0  Smokeless Tobacco Never Used    Goals Met:  Exercise tolerated well No report of cardiac concerns or symptoms Strength training completed today  Goals Unmet:  Not Applicable  Comments: Service time is from 10:30a to 12:15p    Dr. Rush Farmer is Medical Director for Pulmonary Rehab at [facility].

## 2017-08-30 ENCOUNTER — Encounter (HOSPITAL_COMMUNITY)
Admission: RE | Admit: 2017-08-30 | Discharge: 2017-08-30 | Disposition: A | Discharge status: Home / Self Care | Payer: PPO | Source: Ambulatory Visit | Attending: Emergency Medicine | Admitting: Emergency Medicine

## 2017-08-30 ENCOUNTER — Encounter (HOSPITAL_COMMUNITY): Payer: PPO

## 2017-08-30 DIAGNOSIS — J439 Emphysema, unspecified: Secondary | ICD-10-CM

## 2017-08-30 NOTE — Progress Notes (Signed)
Pulmonary Individual Treatment Plan  Patient Details  Name: Charles Archer MRN: [id number] Date of Birth: [birth year] Referring Provider:     Pulmonary Rehab Walk Test from 05/31/2017 in [facility]  Referring Provider  Dr. Lamonte Sakai      Initial Encounter Date:    Pulmonary Rehab Walk Test from 05/31/2017 in [place]  Date  05/31/17  Referring Provider  Dr. Lamonte Sakai      Visit Diagnosis: Pulmonary emphysema, unspecified emphysema type ([place])  Patient's Home Medications on Admission:   Current Outpatient Medications:  .  acetaminophen (TYLENOL ARTHRITIS PAIN) 650 MG CR tablet, Take 650 mg by mouth every 8 (eight) hours as needed for pain., Disp: , Rfl:  .  albuterol (PROAIR HFA) 108 (90 Base) MCG/ACT inhaler, Inhale 2 puffs into the lungs every 6 (six) hours as needed for wheezing or shortness of breath., Disp: 1 Inhaler, Rfl: 5 .  allopurinol (ZYLOPRIM) 100 MG tablet, Take 100 mg by mouth daily., Disp: , Rfl:  .  aspirin 81 MG tablet, Take 81 mg by mouth daily., Disp: , Rfl:  .  B Complex-Folic Acid (SUPER B COMPLEX MAXI PO), Take 1 tablet by mouth daily. , Disp: , Rfl:  .  calcium carbonate (OS-CAL) 600 MG TABS tablet, Take 600 mg by mouth daily., Disp: , Rfl:  .  Cinnamon 500 MG capsule, Take 1,000 mg by mouth daily., Disp: , Rfl:  .  docusate sodium (COLACE) 50 MG capsule, Take 50 mg by mouth daily as needed for mild constipation. , Disp: , Rfl:  .  fluorouracil (EFUDEX) 5 % cream, Apply 5 % topically 2 (two) times daily as needed. , Disp: , Rfl:  .  fluticasone (FLONASE) 50 MCG/ACT nasal spray, PLACE 2 SPRAYS INTO BOTH NOSTRILS DAILY., Disp: 16 g, Rfl: 5 .  Ginkgo Biloba 40 MG TABS, Take 40 mg by mouth daily. , Disp: , Rfl:  .  ipratropium-albuterol (DUONEB) 0.5-2.5 (3) MG/3ML SOLN, Take 3 mLs by nebulization 4 (four) times daily., Disp: 360 mL, Rfl: 5 .  irbesartan (AVAPRO) 300 MG tablet, Take 1 tablet by mouth daily.,  Disp: , Rfl:  .  Misc Natural Products (OSTEO BI-FLEX ADV JOINT SHIELD PO), Take 1,500 mg by mouth daily., Disp: , Rfl:  .  Misc. Devices (ACAPELLA) MISC, Use as directed, Disp: 1 each, Rfl: 0 .  Multiple Vitamin (MULTIVITAMIN) capsule, Take 1 capsule by mouth daily., Disp: , Rfl:  .  Naproxen Sodium (ALEVE PO), Take 220 mg by mouth as needed (for pain). , Disp: , Rfl:  .  niacin 250 MG tablet, Take 250 mg by mouth at bedtime., Disp: , Rfl:  .  sildenafil (REVATIO) 20 MG tablet, Take 1 tablet by mouth daily., Disp: , Rfl:  .  tamsulosin (FLOMAX) 0.4 MG CAPS capsule, Take 1 capsule by mouth daily., Disp: , Rfl:  .  vitamin B-12 (CYANOCOBALAMIN) 1000 MCG tablet, Take 1,000 mcg by mouth daily., Disp: , Rfl:  .  zinc gluconate 50 MG tablet, Take 50 mg by mouth daily., Disp: , Rfl:   Past Medical History: Past Medical History:  Diagnosis Date  . Abnormal stress test    s/p cardiac cath 10/2013 - nonobstructive CAD with normal LV function  . CKD (chronic kidney disease)   . COPD (chronic obstructive pulmonary disease) ([place])   . Dyspnea on exertion   . Emphysema lung ([place])   . GERD (gastroesophageal reflux disease)   . Gout   .  HTN (hypertension)   . Hyperlipidemia     Tobacco Use: Social History   Tobacco Use  Smoking Status Former Smoker  . Packs/day: 3.00  . Years: 30.00  . Pack years: 90.00  . Types: Cigarettes  . Last attempt to quit: 09/03/1989  . Years since quitting: 28.0  Smokeless Tobacco Never Used    Labs: Recent Review Flowsheet Data    There is no flowsheet data to display.      Capillary Blood Glucose: No results found for: GLUCAP   Pulmonary Assessment Scores: Pulmonary Assessment Scores    Row Name 05/31/17 0901 05/31/17 1627       ADL UCSD   ADL Phase  Entry  Entry    SOB Score total  93  -      CAT Score   CAT Score  23 Entry  -      mMRC Score   mMRC Score  -  2       Pulmonary Function Assessment: Pulmonary Function Assessment -  05/28/17 1502      Breath   Bilateral Breath Sounds  Decreased    Shortness of Breath  Yes;Limiting activity       Exercise Target Goals:    Exercise Program Goal: Individual exercise prescription set with THRR, safety & activity barriers. Participant demonstrates ability to understand and report RPE using BORG scale, to self-measure pulse accurately, and to acknowledge the importance of the exercise prescription.  Exercise Prescription Goal: Starting with aerobic activity 30 plus minutes a day, 3 days per week for initial exercise prescription. Provide home exercise prescription and guidelines that participant acknowledges understanding prior to discharge.  Activity Barriers & Risk Stratification: Activity Barriers & Cardiac Risk Stratification - 05/28/17 1447      Activity Barriers & Cardiac Risk Stratification   Activity Barriers  Deconditioning;Shortness of Breath       6 Minute Walk: 6 Minute Walk    Row Name 05/31/17 1622         6 Minute Walk   Phase  Initial     Distance  400 feet     Walk Time  - 2 minutes 55 seconds     # of Rest Breaks  - 2 (3 minutes 5 seconds total)     MPH  0.75     METS  1.09     RPE  11     Perceived Dyspnea   1     Symptoms  Yes (comment)     Comments  WHEELCHAIR     Resting HR  107 bpm     Resting BP  125/83     Max Ex. HR  118 bpm     Max Ex. BP  135/78       Interval HR   Baseline HR (retired)  107     1 Minute HR  116     2 Minute HR  116     3 Minute HR  111     4 Minute HR  118     5 Minute HR  116     6 Minute HR  113     2 Minute Post HR  103     Interval Heart Rate?  Yes       Interval Oxygen   Interval Oxygen?  Yes     Baseline Oxygen Saturation %  91 %     Resting Liters of Oxygen  4 L     1 Minute  Oxygen Saturation %  81 %     1 Minute Liters of Oxygen  4 L     2 Minute Oxygen Saturation %  82 %     2 Minute Liters of Oxygen  4 L     3 Minute Oxygen Saturation %  88 %     3 Minute Liters of Oxygen  4 L      4 Minute Oxygen Saturation %  85 %     4 Minute Liters of Oxygen  4 L     5 Minute Oxygen Saturation %  81 %     5 Minute Liters of Oxygen  4 L     6 Minute Oxygen Saturation %  87 %     6 Minute Liters of Oxygen  4 L     2 Minute Post Oxygen Saturation %  91 %     2 Minute Post Liters of Oxygen  4 L        Oxygen Initial Assessment: Oxygen Initial Assessment - 05/31/17 1611      Initial 6 min Walk   Oxygen Used  Continuous;E-Tanks    Liters per minute  4    Resting Oxygen Saturation   91 %    Exercise Oxygen Saturation  during 6 min walk  81 %      Program Oxygen Prescription   Program Oxygen Prescription  Continuous    Liters per minute  -- unsure since emphysema will contact Dr. Lamonte Sakai for further guidance       Oxygen Re-Evaluation: Oxygen Re-Evaluation    Row Name 06/13/17 1816 07/06/17 1219 08/05/17 2118 08/27/17 1508 08/28/17 1559     Program Oxygen Prescription   Program Oxygen Prescription  Continuous  Continuous  Continuous  Continuous  -   Liters per minute  '4  10  10  10  '$ -   Comments  he does desaturate on 4 liters and requires rest breaks while walking the track. will consult with MD regarding intolerance with ambulation on 4 liters  -  MD has approved increase liter flow to keep saturations >88%. continuing to work with MD office to secure patient appropriate oxygen equiptment for home exercise use  MD has approved increase liter flow to keep saturations >88%. continuing to work with MD office to secure patient appropriate oxygen equiptment for home exercise use  -     Home Oxygen   Home Oxygen Device  Home Concentrator;E-Tanks  Home Concentrator;E-Tanks  Home Concentrator;E-Tanks  -  -   Sleep Oxygen Prescription  Continuous  Continuous  Continuous  -  -   Liters per minute  '3  3  3  '$ -  -   Home Exercise Oxygen Prescription  Pulsed patient only has pulsed regulator on portable tank  Pulsed this is not adequate for home exercise and will work with MD to make  sure patient has sufficient oxygen to exercise.  -  -  Continuous   Liters per minute  4  -  -  -  10   Home at Rest Exercise Oxygen Prescription  Continuous  Continuous  Continuous  -  -   Liters per minute  -  -  3  -  -   Compliance with Home Oxygen Use  Yes  Yes  Yes  -  -     Goals/Expected Outcomes   Short Term Goals  To learn and exhibit compliance with exercise, home and travel O2 prescription;To learn  and understand importance of monitoring SPO2 with pulse oximeter and demonstrate accurate use of the pulse oximeter.;To learn and understand importance of maintaining oxygen saturations>88%;To learn and demonstrate proper pursed lip breathing techniques or other breathing techniques.;To learn and demonstrate proper use of respiratory medications  To learn and exhibit compliance with exercise, home and travel O2 prescription;To learn and understand importance of monitoring SPO2 with pulse oximeter and demonstrate accurate use of the pulse oximeter.;To learn and understand importance of maintaining oxygen saturations>88%;To learn and demonstrate proper pursed lip breathing techniques or other breathing techniques.;To learn and demonstrate proper use of respiratory medications  To learn and exhibit compliance with exercise, home and travel O2 prescription;To learn and understand importance of monitoring SPO2 with pulse oximeter and demonstrate accurate use of the pulse oximeter.;To learn and understand importance of maintaining oxygen saturations>88%;To learn and demonstrate proper pursed lip breathing techniques or other breathing techniques.;To learn and demonstrate proper use of respiratory medications  -  To learn and exhibit compliance with exercise, home and travel O2 prescription;To learn and understand importance of monitoring SPO2 with pulse oximeter and demonstrate accurate use of the pulse oximeter.;To learn and understand importance of maintaining oxygen saturations>88%;To learn and  demonstrate proper pursed lip breathing techniques or other breathing techniques.;To learn and demonstrate proper use of respiratory medications   Long  Term Goals  Exhibits compliance with exercise, home and travel O2 prescription;Verbalizes importance of monitoring SPO2 with pulse oximeter and return demonstration;Maintenance of O2 saturations>88%;Compliance with respiratory medication;Exhibits proper breathing techniques, such as pursed lip breathing or other method taught during program session  -  Exhibits compliance with exercise, home and travel O2 prescription;Verbalizes importance of monitoring SPO2 with pulse oximeter and return demonstration;Maintenance of O2 saturations>88%;Compliance with respiratory medication;Exhibits proper breathing techniques, such as pursed lip breathing or other method taught during program session  -  Exhibits compliance with exercise, home and travel O2 prescription;Verbalizes importance of monitoring SPO2 with pulse oximeter and return demonstration;Maintenance of O2 saturations>88%;Compliance with respiratory medication;Exhibits proper breathing techniques, such as pursed lip breathing or other method taught during program session   Comments  -  patient is compliant with home o2 prescription  patient is compliant with home o2 prescription  -  patient now has appropriate home oxygen for exercise and compliant with oxygen prescription. He has been supplied with 15 e-cylinders   Goals/Expected Outcomes  -  see above goals  see above goals  -  see above goals      Oxygen Discharge (Final Oxygen Re-Evaluation): Oxygen Re-Evaluation - 08/28/17 1559      Home Oxygen   Home Exercise Oxygen Prescription  Continuous    Liters per minute  10      Goals/Expected Outcomes   Short Term Goals  To learn and exhibit compliance with exercise, home and travel O2 prescription;To learn and understand importance of monitoring SPO2 with pulse oximeter and demonstrate accurate use  of the pulse oximeter.;To learn and understand importance of maintaining oxygen saturations>88%;To learn and demonstrate proper pursed lip breathing techniques or other breathing techniques.;To learn and demonstrate proper use of respiratory medications    Long  Term Goals  Exhibits compliance with exercise, home and travel O2 prescription;Verbalizes importance of monitoring SPO2 with pulse oximeter and return demonstration;Maintenance of O2 saturations>88%;Compliance with respiratory medication;Exhibits proper breathing techniques, such as pursed lip breathing or other method taught during program session    Comments  patient now has appropriate home oxygen for exercise and compliant with oxygen prescription. He has  been supplied with 15 e-cylinders    Goals/Expected Outcomes  see above goals       Initial Exercise Prescription: Initial Exercise Prescription - 05/31/17 1600      Date of Initial Exercise RX and Referring Provider   Date  05/31/17    Referring Provider  Dr. Lamonte Sakai      Oxygen   Oxygen  Continuous    Liters  4 4      NuStep   Level  1    Minutes  34    METs  1.2      Arm Ergometer   Level  1    Minutes  17      Prescription Details   Frequency (times per week)  2    Duration  Progress to 45 minutes of aerobic exercise without signs/symptoms of physical distress      Intensity   THRR 40-80% of Max Heartrate  56-113    Ratings of Perceived Exertion  11-13    Perceived Dyspnea  0-4      Resistance Training   Training Prescription  Yes    Weight  blue bands    Reps  10-15       Perform Capillary Blood Glucose checks as needed.  Exercise Prescription Changes: Exercise Prescription Changes    Row Name 06/07/17 1200 06/12/17 1200 06/14/17 1256 06/19/17 1224 06/21/17 1200     Response to Exercise   Blood Pressure (Admit)  142/70  112/60  110/62  104/68  112/60   Blood Pressure (Exercise)  126/62  138/62  140/80  110/60  116/50   Blood Pressure (Exit)   102/62  106/60  114/62  112/70  96/60   Heart Rate (Admit)  102 bpm  95 bpm  83 bpm  109 bpm  98 bpm   Heart Rate (Exercise)  107 bpm  100 bpm  106 bpm  119 bpm  102 bpm   Heart Rate (Exit)  84 bpm  80 bpm  85 bpm  104 bpm  83 bpm   Oxygen Saturation (Admit)  98 %  95 %  97 %  91 %  95 %   Oxygen Saturation (Exercise)  89 %  93 %  79 %  89 %  90 %   Oxygen Saturation (Exit)  97 %  98 %  98 %  99 %  100 %   Rating of Perceived Exertion (Exercise)  '13  12  15  15  13   '$ Perceived Dyspnea (Exercise)  '2  1  4  3  2   '$ Duration  Progress to 45 minutes of aerobic exercise without signs/symptoms of physical distress  Progress to 45 minutes of aerobic exercise without signs/symptoms of physical distress  Progress to 45 minutes of aerobic exercise without signs/symptoms of physical distress  Progress to 45 minutes of aerobic exercise without signs/symptoms of physical distress  Progress to 45 minutes of aerobic exercise without signs/symptoms of physical distress   Intensity  Other (comment) 40-80% of HRR  Other (comment) 40-80% of HRR  Other (comment) 40-80% of HRR  Other (comment) 40-80% of HRR  THRR unchanged     Progression   Progression  Continue to progress workloads to maintain intensity without signs/symptoms of physical distress.  Continue to progress workloads to maintain intensity without signs/symptoms of physical distress.  Continue to progress workloads to maintain intensity without signs/symptoms of physical distress.  Continue to progress workloads to maintain intensity without signs/symptoms of physical  distress.  Continue to progress workloads to maintain intensity without signs/symptoms of physical distress.     Resistance Training   Training Prescription  Yes  Yes  Yes  Yes  Yes   Weight  blue bands  blue bands  blue bands  blue bands  blue bands   Reps  10-15  10-15  10-15  10-15  10-15   Time  10 Minutes  10 Minutes  10 Minutes  10 Minutes  10 Minutes     Oxygen   Oxygen  Continuous   Continuous  Continuous  Continuous  Continuous   Liters  '4  4  4  4  '$ 4-6     NuStep   Level  '2  2  2  3  3   '$ Minutes  17  34  '17  17  17   '$ METs  2.1  2  2.1  2.4  2.7     Arm Ergometer   Level  1  2  -  2  3   Minutes  17  17  -  17  17     Track   Laps  -  -  5  8  -   Minutes  -  -  17  17  -   Row Name 06/26/17 1200 06/28/17 1200 07/03/17 1632 07/05/17 1200 07/10/17 1200     Response to Exercise   Blood Pressure (Admit)  110/60  120/64  120/62  120/64  119/64   Blood Pressure (Exercise)  122/64  128/70  118/80  120/80  104/62   Blood Pressure (Exit)  102/60  102/62  100/60  106/64  90/50   Heart Rate (Admit)  101 bpm  108 bpm  103 bpm  88 bpm  98 bpm   Heart Rate (Exercise)  106 bpm  118 bpm  116 bpm  110 bpm  116 bpm   Heart Rate (Exit)  85 bpm  85 bpm  95 bpm  85 bpm  100 bpm   Oxygen Saturation (Admit)  93 %  90 %  95 %  97 %  94 %   Oxygen Saturation (Exercise)  83 %  83 % 4 liters increased to 8 liters  85 % desaturated on 10L  87 % desaturated on 10L  88 %   Oxygen Saturation (Exit)  98 %  100 %  85 %  97 %  99 %   Rating of Perceived Exertion (Exercise)  '13  13  13  13  14   '$ Perceived Dyspnea (Exercise)  '2  3  3  4  3   '$ Duration  Progress to 45 minutes of aerobic exercise without signs/symptoms of physical distress  Progress to 45 minutes of aerobic exercise without signs/symptoms of physical distress  Progress to 45 minutes of aerobic exercise without signs/symptoms of physical distress  Progress to 45 minutes of aerobic exercise without signs/symptoms of physical distress  Progress to 45 minutes of aerobic exercise without signs/symptoms of physical distress   Intensity  THRR unchanged  THRR unchanged  THRR unchanged  THRR unchanged  THRR unchanged     Progression   Progression  Continue to progress workloads to maintain intensity without signs/symptoms of physical distress.  Continue to progress workloads to maintain intensity without signs/symptoms of physical distress.   Continue to progress workloads to maintain intensity without signs/symptoms of physical distress.  Continue to progress workloads to maintain intensity without signs/symptoms of physical distress.  Continue to progress workloads to maintain intensity without signs/symptoms of physical distress.     Resistance Training   Training Prescription  Yes  Yes  Yes  Yes  Yes   Weight  blue bands  blue bands  blue bands  blue bands  blue bands   Reps  10-15  10-15  10-15  10-15  10-15   Time  10 Minutes  10 Minutes  10 Minutes  10 Minutes  10 Minutes     Oxygen   Oxygen  Continuous  Continuous  Continuous  Continuous  Continuous   Liters  4-6  4-'6  10  10  10     '$ NuStep   Level  '3  4  4  4  5   '$ Minutes  '17  17  17  17  17   '$ METs  -  2.4  2.8  2.6  2.2     Arm Ergometer   Level  -  3  3  -  4   Minutes  -  17  17  -  17     Track   Laps  '7  5  6  6  9   '$ Minutes  '17  17  17  17  17   '$ Row Name 07/12/17 1222 07/19/17 1300 07/24/17 1200 07/31/17 1200 08/02/17 1200     Response to Exercise   Blood Pressure (Admit)  118/56  106/58  130/74  120/68  142/70   Blood Pressure (Exercise)  104/58  118/62  150/82  128/80  144/70   Blood Pressure (Exit)  104/60  96/64  104/70  108/60  110/72   Heart Rate (Admit)  92 bpm  92 bpm  104 bpm  93 bpm  97 bpm   Heart Rate (Exercise)  107 bpm  109 bpm  1256 bpm  114 bpm  117 bpm   Heart Rate (Exit)  85 bpm  69 bpm  102 bpm  93 bpm  95 bpm   Oxygen Saturation (Admit)  97 %  94 %  94 %  99 %  92 %   Oxygen Saturation (Exercise)  85 %  87 %  89 %  85 %  81 % 10 liters 89% 15 liters   Oxygen Saturation (Exit)  99 %  93 %  97 %  87 %  98 %   Rating of Perceived Exertion (Exercise)  '13  13  14  13  14   '$ Perceived Dyspnea (Exercise)  '3  2  2  3  4   '$ Duration  Progress to 45 minutes of aerobic exercise without signs/symptoms of physical distress  Progress to 45 minutes of aerobic exercise without signs/symptoms of physical distress  Progress to 45 minutes of aerobic  exercise without signs/symptoms of physical distress  Progress to 45 minutes of aerobic exercise without signs/symptoms of physical distress  Progress to 45 minutes of aerobic exercise without signs/symptoms of physical distress   Intensity  THRR unchanged  THRR unchanged  THRR unchanged  THRR unchanged  THRR unchanged     Progression   Progression  Continue to progress workloads to maintain intensity without signs/symptoms of physical distress.  Continue to progress workloads to maintain intensity without signs/symptoms of physical distress.  Continue to progress workloads to maintain intensity without signs/symptoms of physical distress.  Continue to progress workloads to maintain intensity without signs/symptoms of physical distress.  Continue to progress workloads to maintain intensity without signs/symptoms of  physical distress.     Resistance Training   Training Prescription  Yes  Yes  Yes  Yes  Yes   Weight  blue bands  blue bands  blue bands  blue bands  blue bands   Reps  10-15  10-15  10-15  10-15  10-15   Time  10 Minutes  10 Minutes  10 Minutes  10 Minutes  10 Minutes     Oxygen   Oxygen  Continuous  Continuous  -  Continuous  Continuous   Liters  10  10  -  10  10     NuStep   Level  '6  6  6  6  '$ -   Minutes  '17  17  17  17  '$ -   METs  3.1  2.7  2.5  2.8  -     Arm Ergometer   Level  '4  4  6  6  6   '$ Minutes  '17  17  17  17  17     '$ Track   Laps  -  -  -  7  8   Minutes  -  -  -  17  17   Row Name 08/07/17 1200 08/09/17 1200 08/14/17 1210 08/16/17 1200 08/21/17 1200     Response to Exercise   Blood Pressure (Admit)  118/62  100/50  104/62  112/60  112/70   Blood Pressure (Exercise)  110/64  130/70  124/70  126/64  130/60   Blood Pressure (Exit)  98/62  110/70  104/66  108/82  108/64   Heart Rate (Admit)  91 bpm  91 bpm  87 bpm  91 bpm  110 bpm   Heart Rate (Exercise)  112 bpm  117 bpm  108 bpm  111 bpm  126 bpm   Heart Rate (Exit)  106 bpm  80 bpm  94 bpm  89 bpm  117 bpm    Oxygen Saturation (Admit)  98 %  97 %  96 %  95 %  94 %   Oxygen Saturation (Exercise)  88 % 10 liters 89% 15 liters  92 %  86 %  90 %  89 %   Oxygen Saturation (Exit)  97 %  97 %  98 %  98 %  95 %   Rating of Perceived Exertion (Exercise)  '15  13  11  14  13   '$ Perceived Dyspnea (Exercise)  '4  3  3  2  3   '$ Duration  Progress to 45 minutes of aerobic exercise without signs/symptoms of physical distress  Progress to 45 minutes of aerobic exercise without signs/symptoms of physical distress  Progress to 45 minutes of aerobic exercise without signs/symptoms of physical distress  Progress to 45 minutes of aerobic exercise without signs/symptoms of physical distress  Progress to 45 minutes of aerobic exercise without signs/symptoms of physical distress   Intensity  THRR unchanged  THRR unchanged  THRR unchanged  THRR unchanged  THRR unchanged     Progression   Progression  Continue to progress workloads to maintain intensity without signs/symptoms of physical distress.  Continue to progress workloads to maintain intensity without signs/symptoms of physical distress.  Continue to progress workloads to maintain intensity without signs/symptoms of physical distress.  Continue to progress workloads to maintain intensity without signs/symptoms of physical distress.  Continue to progress workloads to maintain intensity without signs/symptoms of physical distress.     Resistance Training  Training Prescription  Yes  Yes  Yes  Yes  Yes   Weight  blue bands  blue bands  blue bands  blue bands  blue bands   Reps  10-15  10-15  10-15  10-15  10-15   Time  10 Minutes  10 Minutes  10 Minutes  10 Minutes  10 Minutes     Oxygen   Oxygen  Continuous  Continuous  Continuous  Continuous  Continuous   Liters  '10  10  10  10  10     '$ NuStep   Level  '6  6  6  '$ -  6   Minutes  '17  17  17  '$ -  17   METs  2.7  2.5  2.8  -  2.5     Arm Ergometer   Level  6  -  '6  6  6   '$ Minutes  17  -  '17  17  17     '$ Track   Laps   '8  8  7  8  7   '$ Minutes  '17  17  17  17  17   '$ Row Name 08/23/17 1300 08/28/17 1200 08/30/17 1200         Response to Exercise   Blood Pressure (Admit)  128/70  104/60  110/70     Blood Pressure (Exercise)  122/70  110/62  130/70     Blood Pressure (Exit)  108/60  102/74  116/64     Heart Rate (Admit)  91 bpm  102 bpm  90 bpm     Heart Rate (Exercise)  110 bpm  126 bpm  106 bpm     Heart Rate (Exit)  96 bpm  118 bpm  90 bpm     Oxygen Saturation (Admit)  91 %  96 %  90 %     Oxygen Saturation (Exercise)  87 %  87 %  89 %     Oxygen Saturation (Exit)  97 %  94 %  98 %     Rating of Perceived Exertion (Exercise)  '13  17  15     '$ Perceived Dyspnea (Exercise)  '3  3  2     '$ Duration  Progress to 45 minutes of aerobic exercise without signs/symptoms of physical distress  Progress to 45 minutes of aerobic exercise without signs/symptoms of physical distress  Progress to 45 minutes of aerobic exercise without signs/symptoms of physical distress     Intensity  THRR unchanged  THRR unchanged  THRR unchanged       Progression   Progression  Continue to progress workloads to maintain intensity without signs/symptoms of physical distress.  Continue to progress workloads to maintain intensity without signs/symptoms of physical distress.  Continue to progress workloads to maintain intensity without signs/symptoms of physical distress.       Resistance Training   Training Prescription  Yes  Yes  Yes     Weight  blue bands  blue bands  blue bands     Reps  10-15  10-15  10-15     Time  10 Minutes  10 Minutes  10 Minutes       Oxygen   Oxygen  Continuous  Continuous  Continuous     Liters  '10  10  10       '$ NuStep   Level  '6  6  6     '$ Minutes  17  17  17  METs  2.8  2.5  2.6       Arm Ergometer   Level  -  6  6     Minutes  -  17  17       Track   Laps  7  9  -     Minutes  17  17  -       Home Exercise Plan   Plans to continue exercise at  -  Home (comment)  -     Frequency  -  Add 2  additional days to program exercise sessions.  -        Exercise Comments: Exercise Comments    [place] Name 08/30/17 0739           Exercise Comments  Home exercise completed          Exercise Goals and Review: Exercise Goals    [place] Name 05/28/17 1447             Exercise Goals   Increase Physical Activity  Yes       Intervention  Provide advice, education, support and counseling about physical activity/exercise needs.;Develop an individualized exercise prescription for aerobic and resistive training based on initial evaluation findings, risk stratification, comorbidities and participant's personal goals.       Expected Outcomes  Achievement of increased cardiorespiratory fitness and enhanced flexibility, muscular endurance and strength shown through measurements of functional capacity and personal statement of participant.       Increase Strength and Stamina  Yes       Intervention  Provide advice, education, support and counseling about physical activity/exercise needs.;Develop an individualized exercise prescription for aerobic and resistive training based on initial evaluation findings, risk stratification, comorbidities and participant's personal goals.       Expected Outcomes  Achievement of increased cardiorespiratory fitness and enhanced flexibility, muscular endurance and strength shown through measurements of functional capacity and personal statement of participant.          Exercise Goals Re-Evaluation : Exercise Goals Re-Evaluation    [place] Name 06/12/17 0830 07/02/17 1152 08/06/17 1005 08/30/17 1604       Exercise Goal Re-Evaluation   Exercise Goals Review  Increase Physical Activity;Increase Strength and Stamina;Able to understand and use Dyspnea scale;Able to understand and use rate of perceived exertion (RPE) scale;Knowledge and understanding of Target Heart Rate Range (THRR);Understanding of Exercise Prescription  Increase Strength and Stamina;Increase Physical  Activity;Able to understand and use Dyspnea scale;Able to understand and use rate of perceived exertion (RPE) scale;Knowledge and understanding of Target Heart Rate Range (THRR);Understanding of Exercise Prescription  Increase Physical Activity;Increase Strength and Stamina;Able to understand and use Dyspnea scale;Able to understand and use rate of perceived exertion (RPE) scale;Knowledge and understanding of Target Heart Rate Range (THRR);Understanding of Exercise Prescription  Increase Strength and Stamina;Increase Physical Activity;Able to understand and use rate of perceived exertion (RPE) scale;Able to understand and use Dyspnea scale;Knowledge and understanding of Target Heart Rate Range (THRR);Understanding of Exercise Prescription    Comments  Patient has only attended one exercise session. Will cont to progress as able.   Patient is progressing slow and steady in program. Patient has been desaturating on 8 liters. Permission has been granted from Dr. Lamonte Sakai to increase liter flow to titrate above 88%. Will cont to monitor and progress as able.   Patient is progressing slow and steady in program. When he tries to push himself his oxygen saturation decreases to unsafe levels even on 10 liters.  We have titrated him to 15 liters. Oxymizer pendant has been ordered. Motivated to work hard and make changes although he is limited by his shortness of breath and deconditioning.   Patient is progressing slow and steady in the program. He works hard while he is here. Has adequate oxygen at home for exercise. Has started his home exercise routine. Will cont. to monitor and progress.     Expected Outcomes  Through exercise and education at rehab and at home, patient will increase strength and stamina. Patient will also gain a better understanding of the need for physical activity on a daily basis and the effects it can have on quality of life.  Through exercise and education at rehab and at home, patient will increase  strength and stamina. Patient will also gain a better understanding of the need for physical activity on a daily basis and the effects it can have on quality of life.  Through exercise and education at rehab and at home, patient will increase strength and stamina. Patient will also gain a better understanding of the need for physical activity on a daily basis and the effects it can have on quality of life.  Through exercise at rehab and at home, patient will increase strength and stamina making ADL's easier to perform. Patient will also have a better understanding of safe exercise and what they are capable to do outside of clinical supervision.       Discharge Exercise Prescription (Final Exercise Prescription Changes): Exercise Prescription Changes - 08/30/17 1200      Response to Exercise   Blood Pressure (Admit)  110/70    Blood Pressure (Exercise)  130/70    Blood Pressure (Exit)  116/64    Heart Rate (Admit)  90 bpm    Heart Rate (Exercise)  106 bpm    Heart Rate (Exit)  90 bpm    Oxygen Saturation (Admit)  90 %    Oxygen Saturation (Exercise)  89 %    Oxygen Saturation (Exit)  98 %    Rating of Perceived Exertion (Exercise)  15    Perceived Dyspnea (Exercise)  2    Duration  Progress to 45 minutes of aerobic exercise without signs/symptoms of physical distress    Intensity  THRR unchanged      Progression   Progression  Continue to progress workloads to maintain intensity without signs/symptoms of physical distress.      Resistance Training   Training Prescription  Yes    Weight  blue bands    Reps  10-15    Time  10 Minutes      Oxygen   Oxygen  Continuous    Liters  10      NuStep   Level  6    Minutes  17    METs  2.6      Arm Ergometer   Level  6    Minutes  17       Nutrition:  Target Goals: Understanding of nutrition guidelines, daily intake of sodium '1500mg'$ , cholesterol '200mg'$ , calories 30% from fat and 7% or less from saturated fats, daily to have 5 or  more servings of fruits and vegetables.  Biometrics: Pre Biometrics - 05/28/17 1509      Pre Biometrics   Grip Strength  36 kg        Nutrition Therapy Plan and Nutrition Goals:   Nutrition Discharge: Rate Your Plate Scores:   Nutrition Goals Re-Evaluation:   Nutrition Goals Discharge (Final Nutrition  Goals Re-Evaluation):   Psychosocial: Target Goals: Acknowledge presence or absence of significant depression and/or stress, maximize coping skills, provide positive support system. Participant is able to verbalize types and ability to use techniques and skills needed for reducing stress and depression.  Initial Review & Psychosocial Screening: Initial Psych Review & Screening - 05/28/17 1503      Initial Review   Current issues with  None Identified      Family Dynamics   Good Support System?  Yes      Barriers   Psychosocial barriers to participate in program  There are no identifiable barriers or psychosocial needs.      Screening Interventions   Interventions  Encouraged to exercise       Quality of Life Scores:   PHQ-9: Recent Review Flowsheet Data    Depression screen [facility] 2/9 05/28/2017   Decreased Interest 0   Down, Depressed, Hopeless 0   PHQ - 2 Score 0   Altered sleeping 0   Tired, decreased energy 0   Change in appetite 0   Feeling bad or failure about yourself  0   Trouble concentrating 0   Moving slowly or fidgety/restless 0   Suicidal thoughts 0   PHQ-9 Score 0   Difficult doing work/chores Not difficult at all     Interpretation of Total Score  Total Score Depression Severity:  1-4 = Minimal depression, 5-9 = Mild depression, 10-14 = Moderate depression, 15-19 = Moderately severe depression, 20-27 = Severe depression   Psychosocial Evaluation and Intervention: Psychosocial Evaluation - 05/28/17 1506      Psychosocial Evaluation & Interventions   Interventions  Encouraged to exercise with the program and follow exercise prescription     Expected Outcomes  patient will remain free from psychosocial barriers to participation    Continue Psychosocial Services   No Follow up required       Psychosocial Re-Evaluation: Psychosocial Re-Evaluation    [place] Name 06/13/17 [place] 07/06/17 1223 08/05/17 2122 08/27/17 1530       Psychosocial Re-Evaluation   Current issues with  None Identified  None Identified  None Identified  None Identified    Expected Outcomes  patient will remain free from psychosocial barriers to participation in pulmonary rehab   patient will remain free from psychosocial barriers to participation in pulmonary rehab   patient will remain free from psychosocial barriers to participation in pulmonary rehab   patient will remain free from psychosocial barriers to participation in pulmonary rehab     Interventions  -  -  -  Encouraged to attend Pulmonary Rehabilitation for the exercise    Continue Psychosocial Services   No Follow up required  No Follow up required  No Follow up required  No Follow up required       Psychosocial Discharge (Final Psychosocial Re-Evaluation): Psychosocial Re-Evaluation - 08/27/17 1530      Psychosocial Re-Evaluation   Current issues with  None Identified    Expected Outcomes  patient will remain free from psychosocial barriers to participation in pulmonary rehab     Interventions  Encouraged to attend Pulmonary Rehabilitation for the exercise    Continue Psychosocial Services   No Follow up required       Education: Education Goals: Education classes will be provided on a weekly basis, covering required topics. Participant will state understanding/return demonstration of topics presented.  Learning Barriers/Preferences: Learning Barriers/Preferences - 05/28/17 1502      Learning Barriers/Preferences   Learning Barriers  None  Learning Preferences  Computer/Internet;Group Instruction;Individual Instruction;Written Material       Education Topics: Risk Factor Reduction:   -Group instruction that is supported by a PowerPoint presentation. Instructor discusses the definition of a risk factor, different risk factors for pulmonary disease, and how the heart and lungs work together.     Nutrition for Pulmonary Patient:  -Group instruction provided by PowerPoint slides, verbal discussion, and written materials to support subject matter. The instructor gives an explanation and review of healthy diet recommendations, which includes a discussion on weight management, recommendations for fruit and vegetable consumption, as well as protein, fluid, caffeine, fiber, sodium, sugar, and alcohol. Tips for eating when patients are short of breath are discussed.   PULMONARY REHAB OTHER RESPIRATORY from 08/30/2017 in [place]  Date  08/02/17  Educator  Nutritionist  Instruction Review Code  2- meets goals/outcomes      Pursed Lip Breathing:  -Group instruction that is supported by demonstration and informational handouts. Instructor discusses the benefits of pursed lip and diaphragmatic breathing and detailed demonstration on how to preform both.     Oxygen Safety:  -Group instruction provided by PowerPoint, verbal discussion, and written material to support subject matter. There is an overview of "What is Oxygen" and "Why do we need it".  Instructor also reviews how to create a safe environment for oxygen use, the importance of using oxygen as prescribed, and the risks of noncompliance. There is a brief discussion on traveling with oxygen and resources the patient may utilize.   Oxygen Equipment:  -Group instruction provided by [facility] Staff utilizing handouts, written materials, and equipment demonstrations.   Signs and Symptoms:  -Group instruction provided by written material and verbal discussion to support subject matter. Warning signs and symptoms of infection, stroke, and heart attack are reviewed and when to call the  physician/911 reinforced. Tips for preventing the spread of infection discussed.   PULMONARY REHAB OTHER RESPIRATORY from 08/30/2017 in [place]  Date  07/05/17  Educator  RN  Instruction Review Code  2- meets goals/outcomes      Advanced Directives:  -Group instruction provided by verbal instruction and written material to support subject matter. Instructor reviews Advanced Directive laws and proper instruction for filling out document.   Pulmonary Video:  -Group video education that reviews the importance of medication and oxygen compliance, exercise, good nutrition, pulmonary hygiene, and pursed lip and diaphragmatic breathing for the pulmonary patient.   PULMONARY REHAB OTHER RESPIRATORY from 08/30/2017 in [place]  Date  07/12/17  Instruction Review Code  2- meets goals/outcomes      Exercise for the Pulmonary Patient:  -Group instruction that is supported by a PowerPoint presentation. Instructor discusses benefits of exercise, core components of exercise, frequency, duration, and intensity of an exercise routine, importance of utilizing pulse oximetry during exercise, safety while exercising, and options of places to exercise outside of rehab.     PULMONARY REHAB OTHER RESPIRATORY from 08/30/2017 in [place]  Date  08/23/17  Educator  ep  Instruction Review Code  2- meets goals/outcomes      Pulmonary Medications:  -Verbally interactive group education provided by instructor with focus on inhaled medications and proper administration.   PULMONARY REHAB OTHER RESPIRATORY from 08/30/2017 in [place]  Date  08/16/17  Educator  pharmacist  Instruction Review Code  R- Review/reinforce  Anatomy and Physiology of the Respiratory System and Intimacy:  -Group instruction provided by PowerPoint, verbal discussion, and written material to  support subject matter. Instructor reviews respiratory cycle and anatomical components of the respiratory system and their functions. Instructor also reviews differences in obstructive and restrictive respiratory diseases with examples of each. Intimacy, Sex, and Sexuality differences are reviewed with a discussion on how relationships can change when diagnosed with pulmonary disease. Common sexual concerns are reviewed.   MD DAY -A group question and answer session with a medical doctor that allows participants to ask questions that relate to their pulmonary disease state.   PULMONARY REHAB OTHER RESPIRATORY from 08/30/2017 in [place]  Date  07/24/17  Educator  yacoub  Instruction Review Code  2- meets goals/outcomes      OTHER EDUCATION -Group or individual verbal, written, or video instructions that support the educational goals of the pulmonary rehab program.   Knowledge Questionnaire Score: Knowledge Questionnaire Score - 05/31/17 0901      Knowledge Questionnaire Score   Pre Score  10/13       Core Components/Risk Factors/Patient Goals at Admission: Personal Goals and Risk Factors at Admission - 05/28/17 1503      Core Components/Risk Factors/Patient Goals on Admission   Improve shortness of breath with ADL's  Yes    Intervention  Provide education, individualized exercise plan and daily activity instruction to help decrease symptoms of SOB with activities of daily living.    Expected Outcomes  Short Term: Achieves a reduction of symptoms when performing activities of daily living.    Develop more efficient breathing techniques such as purse lipped breathing and diaphragmatic breathing; and practicing self-pacing with activity  Yes    Intervention  Provide education, demonstration and support about specific breathing techniuqes utilized for more efficient breathing. Include techniques such as pursed lipped breathing, diaphragmatic breathing and  self-pacing activity.    Expected Outcomes  Short Term: Participant will be able to demonstrate and use breathing techniques as needed throughout daily activities.    Increase knowledge of respiratory medications and ability to use respiratory devices properly   Yes    Intervention  Provide education and demonstration as needed of appropriate use of medications, inhalers, and oxygen therapy.    Expected Outcomes  Short Term: Achieves understanding of medications use. Understands that oxygen is a medication prescribed by physician. Demonstrates appropriate use of inhaler and oxygen therapy.       Core Components/Risk Factors/Patient Goals Review:  Goals and Risk Factor Review    [place] Name 06/13/17 1819 07/06/17 1222 08/05/17 2120 08/27/17 1528       Core Components/Risk Factors/Patient Goals Review   Personal Goals Review  Improve shortness of breath with ADL's;Develop more efficient breathing techniques such as purse lipped breathing and diaphragmatic breathing and practicing self-pacing with activity.;Increase knowledge of respiratory medications and ability to use respiratory devices properly.  Improve shortness of breath with ADL's;Develop more efficient breathing techniques such as purse lipped breathing and diaphragmatic breathing and practicing self-pacing with activity.;Increase knowledge of respiratory medications and ability to use respiratory devices properly.  Improve shortness of breath with ADL's;Develop more efficient breathing techniques such as purse lipped breathing and diaphragmatic breathing and practicing self-pacing with activity.;Increase knowledge of respiratory medications and ability to use respiratory devices properly.  Improve shortness of breath with ADL's;Develop more efficient breathing techniques such as purse lipped breathing and diaphragmatic breathing and practicing self-pacing with activity.;Increase knowledge of respiratory medications and ability  to use respiratory  devices properly.    Review  patient has only attended 2 sessions since admission. will evaluate progression towards goals over the next 30 days  patient is making slow progress towards goals related to the initial lack of oxygen orders for exercise. Now have order to maintain saturations 88 or greater with unlimited ammount of oxygen with exertion. He is using plb without cueing and paces himself while taking restbreaks with walking.  patient is now making greater progress in pulmonary rehab related to increase oxygen use parameters. He has perfected his PLB technique and is observed using it during all aspects of his exercise sessios.  patient states his shortness of breath with ADLs has improved. He is using PLB to decrease his shortness of breath during exertional activities. He has attended an education session on pulmonary medications taught by pharmacist and found it extremely informative.    Expected Outcomes  see admission outcomes  see admission outcomes  see admission outcomes  see admission outcomes       Core Components/Risk Factors/Patient Goals at Discharge (Final Review):  Goals and Risk Factor Review - 08/27/17 1528      Core Components/Risk Factors/Patient Goals Review   Personal Goals Review  Improve shortness of breath with ADL's;Develop more efficient breathing techniques such as purse lipped breathing and diaphragmatic breathing and practicing self-pacing with activity.;Increase knowledge of respiratory medications and ability to use respiratory devices properly.    Review  patient states his shortness of breath with ADLs has improved. He is using PLB to decrease his shortness of breath during exertional activities. He has attended an education session on pulmonary medications taught by pharmacist and found it extremely informative.    Expected Outcomes  see admission outcomes       ITP Comments:   Comments: ITP REVIEW Pt is making expected progress toward pulmonary rehab  goals after completing 23 sessions. Recommend continued exercise, life style modification, education, and utilization of breathing techniques to increase stamina and strength and decrease shortness of breath with exertion.

## 2017-08-30 NOTE — Progress Notes (Signed)
Daily Session Note  Patient Details  Name: Charles Archer MRN: [id number] Date of Birth: 05/26/1936 Referring Provider:     Pulmonary Rehab Walk Test from 05/31/2017 in [place]  Referring Provider  Dr. Lamonte Sakai      Encounter Date: 08/30/2017  Check In: Session Check In - 08/30/17 1233      Check-In   Location  [facility]    Staff Present  Su Hilt, MS, ACSM RCEP, Exercise Physiologist;Joan Leonia Reeves, RN, BSN;Lisa Hughes, RN;Portia Rollene Rotunda, RN, BSN    Supervising physician immediately available to respond to emergencies  [facility] immediately available    Physician(s)  Dr. Broadus John    Medication changes reported      No    Fall or balance concerns reported     No    Tobacco Cessation  No Change    Warm-up and Cool-down  Performed as group-led instruction    Resistance Training Performed  Yes    VAD Patient?  No      Pain Assessment   Currently in Pain?  No/denies    Multiple Pain Sites  No       Capillary Blood Glucose: No results found for this or any previous visit (from the past 24 hour(s)).  Exercise Prescription Changes - 08/30/17 1200      Response to Exercise   Blood Pressure (Admit)  110/70    Blood Pressure (Exercise)  130/70    Blood Pressure (Exit)  116/64    Heart Rate (Admit)  90 bpm    Heart Rate (Exercise)  106 bpm    Heart Rate (Exit)  90 bpm    Oxygen Saturation (Admit)  90 %    Oxygen Saturation (Exercise)  89 %    Oxygen Saturation (Exit)  98 %    Rating of Perceived Exertion (Exercise)  15    Perceived Dyspnea (Exercise)  2    Duration  Progress to 45 minutes of aerobic exercise without signs/symptoms of physical distress    Intensity  THRR unchanged      Progression   Progression  Continue to progress workloads to maintain intensity without signs/symptoms of physical distress.      Resistance Training   Training Prescription  Yes    Weight  blue bands    Reps  10-15    Time  10  Minutes      Oxygen   Oxygen  Continuous    Liters  10      NuStep   Level  6    Minutes  17    METs  2.6      Arm Ergometer   Level  6    Minutes  17       Social History   Tobacco Use  Smoking Status Former Smoker  . Packs/day: 3.00  . Years: 30.00  . Pack years: 90.00  . Types: Cigarettes  . Last attempt to quit: 09/03/1989  . Years since quitting: 28.0  Smokeless Tobacco Never Used    Goals Met:  Exercise tolerated well No report of cardiac concerns or symptoms Strength training completed today  Goals Unmet:  Not Applicable  Comments: Service time is from 10:30a to 12:30p    Dr. Rush Farmer is Medical Director for Pulmonary Rehab at [facility].

## 2017-08-30 NOTE — Progress Notes (Signed)
I have reviewed a Home Exercise Prescription with [name] . Charles Archer is not currently exercising at home.  The patient was advised to walk 3 days a week for 30 minutes.  Yeshaya and I discussed how to progress their exercise prescription.  The patient stated that their goals were to be able to get on the golf course again.  The patient stated that they understand the exercise prescription.  We reviewed exercise guidelines, target heart rate during exercise, oxygen use, weather, home pulse oximeter, endpoints for exercise, and goals.  Patient is encouraged to come to me with any questions. I will continue to follow up with the patient to assist them with progression and safety.

## 2017-09-04 ENCOUNTER — Encounter (HOSPITAL_COMMUNITY)
Admission: RE | Admit: 2017-09-04 | Discharge: 2017-09-04 | Disposition: A | Discharge status: Home / Self Care | Payer: PPO | Source: Ambulatory Visit | Attending: Emergency Medicine | Admitting: Emergency Medicine

## 2017-09-04 VITALS — Wt 184.1 lb

## 2017-09-04 DIAGNOSIS — J439 Emphysema, unspecified: Secondary | ICD-10-CM | POA: Diagnosis not present

## 2017-09-04 NOTE — Progress Notes (Signed)
Daily Session Note  Patient Details  Name: Charles Archer MRN: [id number] Date of Birth: [birth year] Referring Provider:     Pulmonary Rehab Walk Test from 05/31/2017 in [place]  Referring Provider  Dr. Lamonte Sakai      Encounter Date: 09/04/2017  Check In: Session Check In - 09/04/17 1018      Check-In   Location  [facility]    Staff Present  Su Hilt, MS, ACSM RCEP, Exercise Physiologist;Verne Cove Ysidro Evert, RN;Joan Leonia Reeves, RN, Luisa Hart, RN, BSN    Supervising physician immediately available to respond to emergencies  [facility] immediately available    Physician(s)  Dr. Maylene Roes    Medication changes reported      No    Fall or balance concerns reported     No    Tobacco Cessation  No Change    Warm-up and Cool-down  Performed as group-led instruction    Resistance Training Performed  Yes    VAD Patient?  No      Pain Assessment   Currently in Pain?  No/denies    Multiple Pain Sites  No       Capillary Blood Glucose: No results found for this or any previous visit (from the past 24 hour(s)).  Exercise Prescription Changes - 09/04/17 1200      Response to Exercise   Blood Pressure (Admit)  110/60    Blood Pressure (Exercise)  124/70    Blood Pressure (Exit)  104/60    Heart Rate (Admit)  101 bpm    Heart Rate (Exercise)  115 bpm    Heart Rate (Exit)  95 bpm    Oxygen Saturation (Admit)  96 %    Oxygen Saturation (Exercise)  81 % O2 increased to15L sat improved to  92%    Oxygen Saturation (Exit)  96 %    Rating of Perceived Exertion (Exercise)  15    Perceived Dyspnea (Exercise)  3    Duration  Progress to 45 minutes of aerobic exercise without signs/symptoms of physical distress    Intensity  THRR unchanged      Progression   Progression  Continue to progress workloads to maintain intensity without signs/symptoms of physical distress.      Resistance Training   Training Prescription  Yes    Weight   blue bands    Reps  10-15    Time  10 Minutes      Oxygen   Oxygen  Continuous    Liters  6-15      NuStep   Level  6    Minutes  17    METs  2.5      Arm Ergometer   Level  6    Minutes  17      Track   Laps  9.5    Minutes  17       Social History   Tobacco Use  Smoking Status Former Smoker  . Packs/day: 3.00  . Years: 30.00  . Pack years: 90.00  . Types: Cigarettes  . Last attempt to quit: 09/03/1989  . Years since quitting: 28.0  Smokeless Tobacco Never Used    Goals Met:  No report of cardiac concerns or symptoms Strength training completed today  Goals Unmet:  O2 Sat On the track sat dropped to 81%. O2 increased to 15 L sat improved to 91%.  Comments: Service time is from 1030 to 1210    Dr. Lloyd Huger.  Nelda Marseille is Market researcher for Pulmonary Rehab at [facility].

## 2017-09-11 ENCOUNTER — Encounter (HOSPITAL_COMMUNITY)
Admission: RE | Admit: 2017-09-11 | Discharge: 2017-09-11 | Disposition: A | Discharge status: Home / Self Care | Payer: PPO | Source: Ambulatory Visit | Attending: Emergency Medicine | Admitting: Emergency Medicine

## 2017-09-11 VITALS — Wt 184.1 lb

## 2017-09-11 DIAGNOSIS — J439 Emphysema, unspecified: Secondary | ICD-10-CM | POA: Diagnosis not present

## 2017-09-11 NOTE — Progress Notes (Signed)
Daily Session Note  Patient Details  Name: Charles Archer MRN: [id number] Date of Birth: 1936-06-18 Referring Provider:     Pulmonary Rehab Walk Test from 05/31/2017 in [place]  Referring Provider  Dr. Lamonte Sakai      Encounter Date: 09/11/2017  Check In: Session Check In - 09/11/17 1030      Check-In   Location  [facility]    Staff Present  Rosebud Poles, RN, BSN;Molly diVincenzo, MS, ACSM RCEP, Exercise Physiologist;Lisa Ysidro Evert, RN;Kennan Detter Rollene Rotunda, RN, BSN    Supervising physician immediately available to respond to emergencies  [facility] immediately available    Physician(s)  Dr. Maylene Roes    Medication changes reported      No    Fall or balance concerns reported     No    Tobacco Cessation  No Change    Warm-up and Cool-down  Performed as group-led instruction    Resistance Training Performed  Yes    VAD Patient?  No      Pain Assessment   Currently in Pain?  No/denies    Multiple Pain Sites  No       Capillary Blood Glucose: No results found for this or any previous visit (from the past 24 hour(s)).  Exercise Prescription Changes - 09/11/17 1229      Response to Exercise   Blood Pressure (Admit)  108/64    Blood Pressure (Exercise)  110/60    Blood Pressure (Exit)  141/79    Heart Rate (Admit)  98 bpm    Heart Rate (Exercise)  114 bpm    Heart Rate (Exit)  103 bpm    Oxygen Saturation (Admit)  93 %    Oxygen Saturation (Exercise)  89 % O2 increased to15L sat improved to  92%    Oxygen Saturation (Exit)  97 %    Rating of Perceived Exertion (Exercise)  15    Perceived Dyspnea (Exercise)  3    Duration  Progress to 45 minutes of aerobic exercise without signs/symptoms of physical distress    Intensity  THRR unchanged      Progression   Progression  Continue to progress workloads to maintain intensity without signs/symptoms of physical distress.      Resistance Training   Training Prescription  Yes    Weight   blue bands    Reps  10-15    Time  10 Minutes      Oxygen   Oxygen  Continuous    Liters  6-10      NuStep   Level  6    Minutes  17    METs  2.5      Arm Ergometer   Level  6    Minutes  17      Track   Laps  7    Minutes  17       Social History   Tobacco Use  Smoking Status Former Smoker  . Packs/day: 3.00  . Years: 30.00  . Pack years: 90.00  . Types: Cigarettes  . Last attempt to quit: 09/03/1989  . Years since quitting: 28.0  Smokeless Tobacco Never Used    Goals Met:  Independence with exercise equipment Improved SOB with ADL's Using PLB without cueing & demonstrates good technique Exercise tolerated well No report of cardiac concerns or symptoms Strength training completed today  Goals Unmet:  Not Applicable  Comments: Service time is from 1030 to 1215   Dr.  Rush Farmer is Medical Director for Pulmonary Rehab at [facility].

## 2017-09-13 ENCOUNTER — Encounter (HOSPITAL_COMMUNITY)
Admission: RE | Admit: 2017-09-13 | Discharge: 2017-09-13 | Disposition: A | Discharge status: Home / Self Care | Payer: PPO | Source: Ambulatory Visit | Attending: Emergency Medicine | Admitting: Emergency Medicine

## 2017-09-13 VITALS — Wt 183.6 lb

## 2017-09-13 DIAGNOSIS — J439 Emphysema, unspecified: Secondary | ICD-10-CM | POA: Diagnosis not present

## 2017-09-13 NOTE — Progress Notes (Signed)
Daily Session Note  Patient Details  Name: Charles Archer MRN: [id number] Date of Birth: Dec 29, 1935 Referring Provider:     Pulmonary Rehab Walk Test from 05/31/2017 in [place]  Referring Provider  Dr. Lamonte Sakai      Encounter Date: 09/13/2017  Check In: Session Check In - 09/13/17 1016      Check-In   Location  [facility]    Staff Present  Rosebud Poles, RN, BSN;Molly diVincenzo, MS, ACSM RCEP, Exercise Physiologist;Lisa Ysidro Evert, RN;Lafreda Casebeer Rollene Rotunda, RN, BSN    Supervising physician immediately available to respond to emergencies  [facility] immediately available    Physician(s)  Dr. Nevada Crane    Medication changes reported      No    Fall or balance concerns reported     No    Tobacco Cessation  No Change    Resistance Training Performed  Yes    VAD Patient?  No      Pain Assessment   Multiple Pain Sites  No       Capillary Blood Glucose: No results found for this or any previous visit (from the past 24 hour(s)).  Exercise Prescription Changes - 09/13/17 1341      Response to Exercise   Blood Pressure (Admit)  122/60    Blood Pressure (Exercise)  120/70    Blood Pressure (Exit)  100/50    Heart Rate (Admit)  89 bpm    Heart Rate (Exercise)  110 bpm    Heart Rate (Exit)  86 bpm    Oxygen Saturation (Admit)  93 %    Oxygen Saturation (Exercise)  89 % O2 increased to15L sat improved to  92%    Oxygen Saturation (Exit)  97 %    Rating of Perceived Exertion (Exercise)  13    Perceived Dyspnea (Exercise)  3    Duration  Progress to 45 minutes of aerobic exercise without signs/symptoms of physical distress    Intensity  THRR unchanged      Progression   Progression  Continue to progress workloads to maintain intensity without signs/symptoms of physical distress.      Resistance Training   Training Prescription  Yes    Weight  blue bands    Reps  10-15    Time  10 Minutes      Oxygen   Oxygen  Continuous    Liters   6-10      Arm Ergometer   Level  6    Minutes  17      Track   Laps  8    Minutes  17       Social History   Tobacco Use  Smoking Status Former Smoker  . Packs/day: 3.00  . Years: 30.00  . Pack years: 90.00  . Types: Cigarettes  . Last attempt to quit: 09/03/1989  . Years since quitting: 28.0  Smokeless Tobacco Never Used    Goals Met:  Independence with exercise equipment Improved SOB with ADL's Using PLB without cueing & demonstrates good technique Exercise tolerated well No report of cardiac concerns or symptoms Strength training completed today  Goals Unmet:  Not Applicable  Comments: Service time is from 1030 to 1225   Dr. Rush Farmer is Medical Director for Pulmonary Rehab at [facility].

## 2017-09-18 ENCOUNTER — Encounter (HOSPITAL_COMMUNITY)
Admission: RE | Admit: 2017-09-18 | Discharge: 2017-09-18 | Disposition: A | Discharge status: Home / Self Care | Payer: PPO | Source: Ambulatory Visit | Attending: Emergency Medicine | Admitting: Emergency Medicine

## 2017-09-18 VITALS — Wt 181.7 lb

## 2017-09-18 DIAGNOSIS — M109 Gout, unspecified: Secondary | ICD-10-CM | POA: Insufficient documentation

## 2017-09-18 DIAGNOSIS — Z87891 Personal history of nicotine dependence: Secondary | ICD-10-CM | POA: Diagnosis not present

## 2017-09-18 DIAGNOSIS — Z79899 Other long term (current) drug therapy: Secondary | ICD-10-CM | POA: Insufficient documentation

## 2017-09-18 DIAGNOSIS — E785 Hyperlipidemia, unspecified: Secondary | ICD-10-CM | POA: Diagnosis not present

## 2017-09-18 DIAGNOSIS — J439 Emphysema, unspecified: Secondary | ICD-10-CM | POA: Diagnosis not present

## 2017-09-18 DIAGNOSIS — N189 Chronic kidney disease, unspecified: Secondary | ICD-10-CM | POA: Diagnosis not present

## 2017-09-18 DIAGNOSIS — Z7982 Long term (current) use of aspirin: Secondary | ICD-10-CM | POA: Diagnosis not present

## 2017-09-18 DIAGNOSIS — Z7951 Long term (current) use of inhaled steroids: Secondary | ICD-10-CM | POA: Diagnosis not present

## 2017-09-18 DIAGNOSIS — I129 Hypertensive chronic kidney disease with stage 1 through stage 4 chronic kidney disease, or unspecified chronic kidney disease: Secondary | ICD-10-CM | POA: Insufficient documentation

## 2017-09-18 DIAGNOSIS — K219 Gastro-esophageal reflux disease without esophagitis: Secondary | ICD-10-CM | POA: Diagnosis not present

## 2017-09-18 NOTE — Progress Notes (Signed)
Daily Session Note  Patient Details  Name: Charles Archer MRN: [id number] Date of Birth: August 16, 1936 Referring Provider:     Pulmonary Rehab Walk Test from 05/31/2017 in [place]  Referring Provider  Dr. Lamonte Sakai      Encounter Date: 09/18/2017  Check In: Session Check In - 09/18/17 1213      Check-In   Location  [facility] Simultaneous filing. User may not have seen previous data.    Staff Present  Rosebud Poles, RN, BSN;Molly diVincenzo, MS, ACSM RCEP, Exercise Physiologist;Abdulkareem Badolato Ysidro Evert, RN;Portia Rollene Rotunda, RN, Therapist, art. User may not have seen previous data.    Supervising physician immediately available to respond to emergencies  [facility] immediately available Simultaneous filing. User may not have seen previous data.    Physician(s)  Dr. [place] Crane Simultaneous filing. User may not have seen previous data.    Medication changes reported      No Simultaneous filing. User may not have seen previous data.    Fall or balance concerns reported     No Simultaneous filing. User may not have seen previous data.    Tobacco Cessation  No Change Simultaneous filing. User may not have seen previous data.    Warm-up and Cool-down  Performed as group-led instruction Simultaneous filing. User may not have seen previous data.    Resistance Training Performed  Yes Simultaneous filing. User may not have seen previous data.    VAD Patient?  No Simultaneous filing. User may not have seen previous data.      Pain Assessment   Currently in Pain?  No/denies Simultaneous filing. User may not have seen previous data.    Multiple Pain Sites  No Simultaneous filing. User may not have seen previous data.       Capillary Blood Glucose: No results found for this or any previous visit (from the past 24 hour(s)).  Exercise Prescription Changes - 09/18/17 1200      Response to Exercise   Blood Pressure (Admit)  108/70    Blood Pressure (Exercise)   100/60    Blood Pressure (Exit)  100/50    Heart Rate (Admit)  90 bpm    Heart Rate (Exercise)  118 bpm    Heart Rate (Exit)  101 bpm    Oxygen Saturation (Admit)  93 %    Oxygen Saturation (Exercise)  89 %    Oxygen Saturation (Exit)  97 %    Rating of Perceived Exertion (Exercise)  13    Perceived Dyspnea (Exercise)  3    Duration  Progress to 45 minutes of aerobic exercise without signs/symptoms of physical distress    Intensity  THRR unchanged      Progression   Progression  Continue to progress workloads to maintain intensity without signs/symptoms of physical distress.      Resistance Training   Training Prescription  Yes    Weight  blue bands    Reps  10-15    Time  10 Minutes      Oxygen   Oxygen  Continuous    Liters  6-10      NuStep   Level  6    Minutes  17    METs  2.6      Arm Ergometer   Level  6    Minutes  17      Track   Laps  8    Minutes  17  Social History   Tobacco Use  Smoking Status Former Smoker  . Packs/day: 3.00  . Years: 30.00  . Pack years: 90.00  . Types: Cigarettes  . Last attempt to quit: 09/03/1989  . Years since quitting: 28.0  Smokeless Tobacco Never Used    Goals Met:  Exercise tolerated well No report of cardiac concerns or symptoms Strength training completed today  Goals Unmet:  Not Applicable  Comments: ,Service time is from 1030 to 1205   Dr. Rush Farmer is Medical Director for Pulmonary Rehab at [facility].

## 2017-09-20 ENCOUNTER — Encounter (HOSPITAL_COMMUNITY)
Admission: RE | Admit: 2017-09-20 | Discharge: 2017-09-20 | Disposition: A | Discharge status: Home / Self Care | Payer: PPO | Source: Ambulatory Visit | Attending: Emergency Medicine | Admitting: Emergency Medicine

## 2017-09-20 VITALS — Wt 182.1 lb

## 2017-09-20 DIAGNOSIS — J439 Emphysema, unspecified: Secondary | ICD-10-CM | POA: Diagnosis not present

## 2017-09-20 NOTE — Progress Notes (Signed)
Pulmonary Individual Treatment Plan  Patient Details  Name: Charles Archer MRN: [id number] Date of Birth: [birth year] Referring Provider:     Pulmonary Rehab Walk Test from 05/31/2017 in [facility]  Referring Provider  Dr. Lamonte Sakai      Initial Encounter Date:    Pulmonary Rehab Walk Test from 05/31/2017 in [place]  Date  05/31/17  Referring Provider  Dr. Lamonte Sakai      Visit Diagnosis: Pulmonary emphysema, unspecified emphysema type ([place])  Patient's Home Medications on Admission:   Current Outpatient Medications:  .  acetaminophen (TYLENOL ARTHRITIS PAIN) 650 MG CR tablet, Take 650 mg by mouth every 8 (eight) hours as needed for pain., Disp: , Rfl:  .  albuterol (PROAIR HFA) 108 (90 Base) MCG/ACT inhaler, Inhale 2 puffs into the lungs every 6 (six) hours as needed for wheezing or shortness of breath., Disp: 1 Inhaler, Rfl: 5 .  allopurinol (ZYLOPRIM) 100 MG tablet, Take 100 mg by mouth daily., Disp: , Rfl:  .  aspirin 81 MG tablet, Take 81 mg by mouth daily., Disp: , Rfl:  .  B Complex-Folic Acid (SUPER B COMPLEX MAXI PO), Take 1 tablet by mouth daily. , Disp: , Rfl:  .  calcium carbonate (OS-CAL) 600 MG TABS tablet, Take 600 mg by mouth daily., Disp: , Rfl:  .  Cinnamon 500 MG capsule, Take 1,000 mg by mouth daily., Disp: , Rfl:  .  docusate sodium (COLACE) 50 MG capsule, Take 50 mg by mouth daily as needed for mild constipation. , Disp: , Rfl:  .  fluorouracil (EFUDEX) 5 % cream, Apply 5 % topically 2 (two) times daily as needed. , Disp: , Rfl:  .  fluticasone (FLONASE) 50 MCG/ACT nasal spray, PLACE 2 SPRAYS INTO BOTH NOSTRILS DAILY., Disp: 16 g, Rfl: 5 .  Ginkgo Biloba 40 MG TABS, Take 40 mg by mouth daily. , Disp: , Rfl:  .  ipratropium-albuterol (DUONEB) 0.5-2.5 (3) MG/3ML SOLN, Take 3 mLs by nebulization 4 (four) times daily., Disp: 360 mL, Rfl: 5 .  irbesartan (AVAPRO) 300 MG tablet, Take 1 tablet by mouth daily.,  Disp: , Rfl:  .  Misc Natural Products (OSTEO BI-FLEX ADV JOINT SHIELD PO), Take 1,500 mg by mouth daily., Disp: , Rfl:  .  Misc. Devices (ACAPELLA) MISC, Use as directed, Disp: 1 each, Rfl: 0 .  Multiple Vitamin (MULTIVITAMIN) capsule, Take 1 capsule by mouth daily., Disp: , Rfl:  .  Naproxen Sodium (ALEVE PO), Take 220 mg by mouth as needed (for pain). , Disp: , Rfl:  .  niacin 250 MG tablet, Take 250 mg by mouth at bedtime., Disp: , Rfl:  .  sildenafil (REVATIO) 20 MG tablet, Take 1 tablet by mouth daily., Disp: , Rfl:  .  tamsulosin (FLOMAX) 0.4 MG CAPS capsule, Take 1 capsule by mouth daily., Disp: , Rfl:  .  vitamin B-12 (CYANOCOBALAMIN) 1000 MCG tablet, Take 1,000 mcg by mouth daily., Disp: , Rfl:  .  zinc gluconate 50 MG tablet, Take 50 mg by mouth daily., Disp: , Rfl:   Past Medical History: Past Medical History:  Diagnosis Date  . Abnormal stress test    s/p cardiac cath 10/2013 - nonobstructive CAD with normal LV function  . CKD (chronic kidney disease)   . COPD (chronic obstructive pulmonary disease) ([place])   . Dyspnea on exertion   . Emphysema lung ([place])   . GERD (gastroesophageal reflux disease)   . Gout   .  HTN (hypertension)   . Hyperlipidemia     Tobacco Use: Social History   Tobacco Use  Smoking Status Former Smoker  . Packs/day: 3.00  . Years: 30.00  . Pack years: 90.00  . Types: Cigarettes  . Last attempt to quit: 09/03/1989  . Years since quitting: 28.0  Smokeless Tobacco Never Used    Labs: Recent Review Flowsheet Data    There is no flowsheet data to display.      Capillary Blood Glucose: No results found for: GLUCAP   Pulmonary Assessment Scores: Pulmonary Assessment Scores    Row Name 05/31/17 0901 05/31/17 1627       ADL UCSD   ADL Phase  Entry  Entry    SOB Score total  93  -      CAT Score   CAT Score  23 Entry  -      mMRC Score   mMRC Score  -  2       Pulmonary Function Assessment: Pulmonary Function Assessment -  05/28/17 1502      Breath   Bilateral Breath Sounds  Decreased    Shortness of Breath  Yes;Limiting activity       Exercise Target Goals:    Exercise Program Goal: Individual exercise prescription set with THRR, safety & activity barriers. Participant demonstrates ability to understand and report RPE using BORG scale, to self-measure pulse accurately, and to acknowledge the importance of the exercise prescription.  Exercise Prescription Goal: Starting with aerobic activity 30 plus minutes a day, 3 days per week for initial exercise prescription. Provide home exercise prescription and guidelines that participant acknowledges understanding prior to discharge.  Activity Barriers & Risk Stratification: Activity Barriers & Cardiac Risk Stratification - 05/28/17 1447      Activity Barriers & Cardiac Risk Stratification   Activity Barriers  Deconditioning;Shortness of Breath       6 Minute Walk: 6 Minute Walk    Row Name 05/31/17 1622         6 Minute Walk   Phase  Initial     Distance  400 feet     Walk Time  - 2 minutes 55 seconds     # of Rest Breaks  - 2 (3 minutes 5 seconds total)     MPH  0.75     METS  1.09     RPE  11     Perceived Dyspnea   1     Symptoms  Yes (comment)     Comments  WHEELCHAIR     Resting HR  107 bpm     Resting BP  125/83     Max Ex. HR  118 bpm     Max Ex. BP  135/78       Interval HR   Baseline HR (retired)  107     1 Minute HR  116     2 Minute HR  116     3 Minute HR  111     4 Minute HR  118     5 Minute HR  116     6 Minute HR  113     2 Minute Post HR  103     Interval Heart Rate?  Yes       Interval Oxygen   Interval Oxygen?  Yes     Baseline Oxygen Saturation %  91 %     Resting Liters of Oxygen  4 L     1 Minute  Oxygen Saturation %  81 %     1 Minute Liters of Oxygen  4 L     2 Minute Oxygen Saturation %  82 %     2 Minute Liters of Oxygen  4 L     3 Minute Oxygen Saturation %  88 %     3 Minute Liters of Oxygen  4 L      4 Minute Oxygen Saturation %  85 %     4 Minute Liters of Oxygen  4 L     5 Minute Oxygen Saturation %  81 %     5 Minute Liters of Oxygen  4 L     6 Minute Oxygen Saturation %  87 %     6 Minute Liters of Oxygen  4 L     2 Minute Post Oxygen Saturation %  91 %     2 Minute Post Liters of Oxygen  4 L        Oxygen Initial Assessment: Oxygen Initial Assessment - 05/31/17 1611      Initial 6 min Walk   Oxygen Used  Continuous;E-Tanks    Liters per minute  4    Resting Oxygen Saturation   91 %    Exercise Oxygen Saturation  during 6 min walk  81 %      Program Oxygen Prescription   Program Oxygen Prescription  Continuous    Liters per minute  -- unsure since emphysema will contact Dr. Lamonte Sakai for further guidance       Oxygen Re-Evaluation: Oxygen Re-Evaluation    Row Name 06/13/17 1816 07/06/17 1219 08/05/17 2118 08/27/17 1508 08/28/17 1559     Program Oxygen Prescription   Program Oxygen Prescription  Continuous  Continuous  Continuous  Continuous  -   Liters per minute  '4  10  10  10  '$ -   Comments  he does desaturate on 4 liters and requires rest breaks while walking the track. will consult with MD regarding intolerance with ambulation on 4 liters  -  MD has approved increase liter flow to keep saturations >88%. continuing to work with MD office to secure patient appropriate oxygen equiptment for home exercise use  MD has approved increase liter flow to keep saturations >88%. continuing to work with MD office to secure patient appropriate oxygen equiptment for home exercise use  -     Home Oxygen   Home Oxygen Device  Home Concentrator;E-Tanks  Home Concentrator;E-Tanks  Home Concentrator;E-Tanks  -  -   Sleep Oxygen Prescription  Continuous  Continuous  Continuous  -  -   Liters per minute  '3  3  3  '$ -  -   Home Exercise Oxygen Prescription  Pulsed patient only has pulsed regulator on portable tank  Pulsed this is not adequate for home exercise and will work with MD to make  sure patient has sufficient oxygen to exercise.  -  -  Continuous   Liters per minute  4  -  -  -  10   Home at Rest Exercise Oxygen Prescription  Continuous  Continuous  Continuous  -  -   Liters per minute  -  -  3  -  -   Compliance with Home Oxygen Use  Yes  Yes  Yes  -  -     Goals/Expected Outcomes   Short Term Goals  To learn and exhibit compliance with exercise, home and travel O2 prescription;To learn  and understand importance of monitoring SPO2 with pulse oximeter and demonstrate accurate use of the pulse oximeter.;To learn and understand importance of maintaining oxygen saturations>88%;To learn and demonstrate proper pursed lip breathing techniques or other breathing techniques.;To learn and demonstrate proper use of respiratory medications  To learn and exhibit compliance with exercise, home and travel O2 prescription;To learn and understand importance of monitoring SPO2 with pulse oximeter and demonstrate accurate use of the pulse oximeter.;To learn and understand importance of maintaining oxygen saturations>88%;To learn and demonstrate proper pursed lip breathing techniques or other breathing techniques.;To learn and demonstrate proper use of respiratory medications  To learn and exhibit compliance with exercise, home and travel O2 prescription;To learn and understand importance of monitoring SPO2 with pulse oximeter and demonstrate accurate use of the pulse oximeter.;To learn and understand importance of maintaining oxygen saturations>88%;To learn and demonstrate proper pursed lip breathing techniques or other breathing techniques.;To learn and demonstrate proper use of respiratory medications  -  To learn and exhibit compliance with exercise, home and travel O2 prescription;To learn and understand importance of monitoring SPO2 with pulse oximeter and demonstrate accurate use of the pulse oximeter.;To learn and understand importance of maintaining oxygen saturations>88%;To learn and  demonstrate proper pursed lip breathing techniques or other breathing techniques.;To learn and demonstrate proper use of respiratory medications   Long  Term Goals  Exhibits compliance with exercise, home and travel O2 prescription;Verbalizes importance of monitoring SPO2 with pulse oximeter and return demonstration;Maintenance of O2 saturations>88%;Compliance with respiratory medication;Exhibits proper breathing techniques, such as pursed lip breathing or other method taught during program session  -  Exhibits compliance with exercise, home and travel O2 prescription;Verbalizes importance of monitoring SPO2 with pulse oximeter and return demonstration;Maintenance of O2 saturations>88%;Compliance with respiratory medication;Exhibits proper breathing techniques, such as pursed lip breathing or other method taught during program session  -  Exhibits compliance with exercise, home and travel O2 prescription;Verbalizes importance of monitoring SPO2 with pulse oximeter and return demonstration;Maintenance of O2 saturations>88%;Compliance with respiratory medication;Exhibits proper breathing techniques, such as pursed lip breathing or other method taught during program session   Comments  -  patient is compliant with home o2 prescription  patient is compliant with home o2 prescription  -  patient now has appropriate home oxygen for exercise and compliant with oxygen prescription. He has been supplied with 15 e-cylinders   Goals/Expected Outcomes  -  see above goals  see above goals  -  see above goals   Row Name 09/18/17 1422             Program Oxygen Prescription   Program Oxygen Prescription  Continuous       Liters per minute  10       Comments  MD has approved increase liter flow to keep saturations >88%. continuing to work with MD office to secure patient appropriate oxygen equiptment for home exercise use         Home Oxygen   Home Oxygen Device  Home Concentrator;E-Tanks       Sleep Oxygen  Prescription  Continuous       Liters per minute  3       Home Exercise Oxygen Prescription  Continuous       Liters per minute  10       Home at Rest Exercise Oxygen Prescription  Continuous       Liters per minute  3       Compliance with Home Oxygen Use  Yes  Goals/Expected Outcomes   Short Term Goals  To learn and exhibit compliance with exercise, home and travel O2 prescription;To learn and understand importance of monitoring SPO2 with pulse oximeter and demonstrate accurate use of the pulse oximeter.;To learn and understand importance of maintaining oxygen saturations>88%;To learn and demonstrate proper pursed lip breathing techniques or other breathing techniques.;To learn and demonstrate proper use of respiratory medications       Long  Term Goals  Exhibits compliance with exercise, home and travel O2 prescription;Verbalizes importance of monitoring SPO2 with pulse oximeter and return demonstration;Maintenance of O2 saturations>88%;Compliance with respiratory medication;Exhibits proper breathing techniques, such as pursed lip breathing or other method taught during program session       Comments  patient now has appropriate home oxygen for exercise and compliant with oxygen prescription. He has been supplied with 15 e-cylinders       Goals/Expected Outcomes  see above goals          Oxygen Discharge (Final Oxygen Re-Evaluation): Oxygen Re-Evaluation - 09/18/17 1422      Program Oxygen Prescription   Program Oxygen Prescription  Continuous    Liters per minute  10    Comments  MD has approved increase liter flow to keep saturations >88%. continuing to work with MD office to secure patient appropriate oxygen equiptment for home exercise use      Home Oxygen   Home Oxygen Device  Home Concentrator;E-Tanks    Sleep Oxygen Prescription  Continuous    Liters per minute  3    Home Exercise Oxygen Prescription  Continuous    Liters per minute  10    Home at Rest Exercise Oxygen  Prescription  Continuous    Liters per minute  3    Compliance with Home Oxygen Use  Yes      Goals/Expected Outcomes   Short Term Goals  To learn and exhibit compliance with exercise, home and travel O2 prescription;To learn and understand importance of monitoring SPO2 with pulse oximeter and demonstrate accurate use of the pulse oximeter.;To learn and understand importance of maintaining oxygen saturations>88%;To learn and demonstrate proper pursed lip breathing techniques or other breathing techniques.;To learn and demonstrate proper use of respiratory medications    Long  Term Goals  Exhibits compliance with exercise, home and travel O2 prescription;Verbalizes importance of monitoring SPO2 with pulse oximeter and return demonstration;Maintenance of O2 saturations>88%;Compliance with respiratory medication;Exhibits proper breathing techniques, such as pursed lip breathing or other method taught during program session    Comments  patient now has appropriate home oxygen for exercise and compliant with oxygen prescription. He has been supplied with 15 e-cylinders    Goals/Expected Outcomes  see above goals       Initial Exercise Prescription: Initial Exercise Prescription - 05/31/17 1600      Date of Initial Exercise RX and Referring Provider   Date  05/31/17    Referring Provider  Dr. Lamonte Sakai      Oxygen   Oxygen  Continuous    Liters  4 4      NuStep   Level  1    Minutes  34    METs  1.2      Arm Ergometer   Level  1    Minutes  17      Prescription Details   Frequency (times per week)  2    Duration  Progress to 45 minutes of aerobic exercise without signs/symptoms of physical distress      Intensity  THRR 40-80% of Max Heartrate  56-113    Ratings of Perceived Exertion  11-13    Perceived Dyspnea  0-4      Resistance Training   Training Prescription  Yes    Weight  blue bands    Reps  10-15       Perform Capillary Blood Glucose checks as needed.  Exercise  Prescription Changes: Exercise Prescription Changes    Row Name 06/07/17 1200 06/12/17 1200 06/14/17 1256 06/19/17 1224 06/21/17 1200     Response to Exercise   Blood Pressure (Admit)  142/70  112/60  110/62  104/68  112/60   Blood Pressure (Exercise)  126/62  138/62  140/80  110/60  116/50   Blood Pressure (Exit)  102/62  106/60  114/62  112/70  96/60   Heart Rate (Admit)  102 bpm  95 bpm  83 bpm  109 bpm  98 bpm   Heart Rate (Exercise)  107 bpm  100 bpm  106 bpm  119 bpm  102 bpm   Heart Rate (Exit)  84 bpm  80 bpm  85 bpm  104 bpm  83 bpm   Oxygen Saturation (Admit)  98 %  95 %  97 %  91 %  95 %   Oxygen Saturation (Exercise)  89 %  93 %  79 %  89 %  90 %   Oxygen Saturation (Exit)  97 %  98 %  98 %  99 %  100 %   Rating of Perceived Exertion (Exercise)  '13  12  15  15  13   '$ Perceived Dyspnea (Exercise)  '2  1  4  3  2   '$ Duration  Progress to 45 minutes of aerobic exercise without signs/symptoms of physical distress  Progress to 45 minutes of aerobic exercise without signs/symptoms of physical distress  Progress to 45 minutes of aerobic exercise without signs/symptoms of physical distress  Progress to 45 minutes of aerobic exercise without signs/symptoms of physical distress  Progress to 45 minutes of aerobic exercise without signs/symptoms of physical distress   Intensity  Other (comment) 40-80% of HRR  Other (comment) 40-80% of HRR  Other (comment) 40-80% of HRR  Other (comment) 40-80% of HRR  THRR unchanged     Progression   Progression  Continue to progress workloads to maintain intensity without signs/symptoms of physical distress.  Continue to progress workloads to maintain intensity without signs/symptoms of physical distress.  Continue to progress workloads to maintain intensity without signs/symptoms of physical distress.  Continue to progress workloads to maintain intensity without signs/symptoms of physical distress.  Continue to progress workloads to maintain intensity without  signs/symptoms of physical distress.     Resistance Training   Training Prescription  Yes  Yes  Yes  Yes  Yes   Weight  blue bands  blue bands  blue bands  blue bands  blue bands   Reps  10-15  10-15  10-15  10-15  10-15   Time  10 Minutes  10 Minutes  10 Minutes  10 Minutes  10 Minutes     Oxygen   Oxygen  Continuous  Continuous  Continuous  Continuous  Continuous   Liters  '4  4  4  4  '$ 4-6     NuStep   Level  '2  2  2  3  3   '$ Minutes  17  34  '17  17  17   '$ METs  2.1  2  2.1  2.4  2.7     Arm Ergometer   Level  1  2  -  2  3   Minutes  17  17  -  17  17     Track   Laps  -  -  5  8  -   Minutes  -  -  28  17  -   Row Name 06/26/17 1200 06/28/17 1200 07/03/17 1632 07/05/17 1200 07/10/17 1200     Response to Exercise   Blood Pressure (Admit)  110/60  120/64  120/62  120/64  119/64   Blood Pressure (Exercise)  122/64  128/70  118/80  120/80  104/62   Blood Pressure (Exit)  102/60  102/62  100/60  106/64  90/50   Heart Rate (Admit)  101 bpm  108 bpm  103 bpm  88 bpm  98 bpm   Heart Rate (Exercise)  106 bpm  118 bpm  116 bpm  110 bpm  116 bpm   Heart Rate (Exit)  85 bpm  85 bpm  95 bpm  85 bpm  100 bpm   Oxygen Saturation (Admit)  93 %  90 %  95 %  97 %  94 %   Oxygen Saturation (Exercise)  83 %  83 % 4 liters increased to 8 liters  85 % desaturated on 10L  87 % desaturated on 10L  88 %   Oxygen Saturation (Exit)  98 %  100 %  85 %  97 %  99 %   Rating of Perceived Exertion (Exercise)  '13  13  13  13  14   '$ Perceived Dyspnea (Exercise)  '2  3  3  4  3   '$ Duration  Progress to 45 minutes of aerobic exercise without signs/symptoms of physical distress  Progress to 45 minutes of aerobic exercise without signs/symptoms of physical distress  Progress to 45 minutes of aerobic exercise without signs/symptoms of physical distress  Progress to 45 minutes of aerobic exercise without signs/symptoms of physical distress  Progress to 45 minutes of aerobic exercise without signs/symptoms of physical  distress   Intensity  THRR unchanged  THRR unchanged  THRR unchanged  THRR unchanged  THRR unchanged     Progression   Progression  Continue to progress workloads to maintain intensity without signs/symptoms of physical distress.  Continue to progress workloads to maintain intensity without signs/symptoms of physical distress.  Continue to progress workloads to maintain intensity without signs/symptoms of physical distress.  Continue to progress workloads to maintain intensity without signs/symptoms of physical distress.  Continue to progress workloads to maintain intensity without signs/symptoms of physical distress.     Resistance Training   Training Prescription  Yes  Yes  Yes  Yes  Yes   Weight  blue bands  blue bands  blue bands  blue bands  blue bands   Reps  10-15  10-15  10-15  10-15  10-15   Time  10 Minutes  10 Minutes  10 Minutes  10 Minutes  10 Minutes     Oxygen   Oxygen  Continuous  Continuous  Continuous  Continuous  Continuous   Liters  4-6  4-'6  10  10  10     '$ NuStep   Level  '3  4  4  4  5   '$ Minutes  '17  17  17  17  17   '$ METs  -  2.4  2.8  2.6  2.2     Arm Ergometer   Level  -  3  3  -  4   Minutes  -  17  17  -  17     Track   Laps  '7  5  6  6  9   '$ Minutes  '17  17  17  17  17   '$ Row Name 07/12/17 1222 07/19/17 1300 07/24/17 1200 07/31/17 1200 08/02/17 1200     Response to Exercise   Blood Pressure (Admit)  118/56  106/58  130/74  120/68  142/70   Blood Pressure (Exercise)  104/58  118/62  150/82  128/80  144/70   Blood Pressure (Exit)  104/60  96/64  104/70  108/60  110/72   Heart Rate (Admit)  92 bpm  92 bpm  104 bpm  93 bpm  97 bpm   Heart Rate (Exercise)  107 bpm  109 bpm  1256 bpm  114 bpm  117 bpm   Heart Rate (Exit)  85 bpm  69 bpm  102 bpm  93 bpm  95 bpm   Oxygen Saturation (Admit)  97 %  94 %  94 %  99 %  92 %   Oxygen Saturation (Exercise)  85 %  87 %  89 %  85 %  81 % 10 liters 89% 15 liters   Oxygen Saturation (Exit)  99 %  93 %  97 %  87 %  98 %    Rating of Perceived Exertion (Exercise)  '13  13  14  13  14   '$ Perceived Dyspnea (Exercise)  '3  2  2  3  4   '$ Duration  Progress to 45 minutes of aerobic exercise without signs/symptoms of physical distress  Progress to 45 minutes of aerobic exercise without signs/symptoms of physical distress  Progress to 45 minutes of aerobic exercise without signs/symptoms of physical distress  Progress to 45 minutes of aerobic exercise without signs/symptoms of physical distress  Progress to 45 minutes of aerobic exercise without signs/symptoms of physical distress   Intensity  THRR unchanged  THRR unchanged  THRR unchanged  THRR unchanged  THRR unchanged     Progression   Progression  Continue to progress workloads to maintain intensity without signs/symptoms of physical distress.  Continue to progress workloads to maintain intensity without signs/symptoms of physical distress.  Continue to progress workloads to maintain intensity without signs/symptoms of physical distress.  Continue to progress workloads to maintain intensity without signs/symptoms of physical distress.  Continue to progress workloads to maintain intensity without signs/symptoms of physical distress.     Resistance Training   Training Prescription  Yes  Yes  Yes  Yes  Yes   Weight  blue bands  blue bands  blue bands  blue bands  blue bands   Reps  10-15  10-15  10-15  10-15  10-15   Time  10 Minutes  10 Minutes  10 Minutes  10 Minutes  10 Minutes     Oxygen   Oxygen  Continuous  Continuous  -  Continuous  Continuous   Liters  10  10  -  10  10     NuStep   Level  '6  6  6  6  '$ -   Minutes  '17  17  17  17  '$ -   METs  3.1  2.7  2.5  2.8  -     Arm Ergometer   Level  $'4  4  6  6  6   'z$ Minutes  '17  17  17  17  17     '$ Track   Laps  -  -  -  7  8   Minutes  -  -  -  17  17   Row Name 08/07/17 1200 08/09/17 1200 08/14/17 1210 08/16/17 1200 08/21/17 1200     Response to Exercise   Blood Pressure (Admit)  118/62  100/50  104/62  112/60   112/70   Blood Pressure (Exercise)  110/64  130/70  124/70  126/64  130/60   Blood Pressure (Exit)  98/62  110/70  104/66  108/82  108/64   Heart Rate (Admit)  91 bpm  91 bpm  87 bpm  91 bpm  110 bpm   Heart Rate (Exercise)  112 bpm  117 bpm  108 bpm  111 bpm  126 bpm   Heart Rate (Exit)  106 bpm  80 bpm  94 bpm  89 bpm  117 bpm   Oxygen Saturation (Admit)  98 %  97 %  96 %  95 %  94 %   Oxygen Saturation (Exercise)  88 % 10 liters 89% 15 liters  92 %  86 %  90 %  89 %   Oxygen Saturation (Exit)  97 %  97 %  98 %  98 %  95 %   Rating of Perceived Exertion (Exercise)  '15  13  11  14  13   '$ Perceived Dyspnea (Exercise)  '4  3  3  2  3   '$ Duration  Progress to 45 minutes of aerobic exercise without signs/symptoms of physical distress  Progress to 45 minutes of aerobic exercise without signs/symptoms of physical distress  Progress to 45 minutes of aerobic exercise without signs/symptoms of physical distress  Progress to 45 minutes of aerobic exercise without signs/symptoms of physical distress  Progress to 45 minutes of aerobic exercise without signs/symptoms of physical distress   Intensity  THRR unchanged  THRR unchanged  THRR unchanged  THRR unchanged  THRR unchanged     Progression   Progression  Continue to progress workloads to maintain intensity without signs/symptoms of physical distress.  Continue to progress workloads to maintain intensity without signs/symptoms of physical distress.  Continue to progress workloads to maintain intensity without signs/symptoms of physical distress.  Continue to progress workloads to maintain intensity without signs/symptoms of physical distress.  Continue to progress workloads to maintain intensity without signs/symptoms of physical distress.     Resistance Training   Training Prescription  Yes  Yes  Yes  Yes  Yes   Weight  blue bands  blue bands  blue bands  blue bands  blue bands   Reps  10-15  10-15  10-15  10-15  10-15   Time  10 Minutes  10 Minutes  10  Minutes  10 Minutes  10 Minutes     Oxygen   Oxygen  Continuous  Continuous  Continuous  Continuous  Continuous   Liters  '10  10  10  10  10     '$ NuStep   Level  '6  6  6  '$ -  6   Minutes  '17  17  17  '$ -  17   METs  2.7  2.5  2.8  -  2.5     Arm Ergometer   Level  6  -  6  6  6  Minutes  17  -  '17  17  17     '$ Track   Laps  '8  8  7  8  7   '$ Minutes  '17  17  17  17  17   '$ Row Name 08/23/17 1300 08/28/17 1200 08/30/17 1200 09/04/17 1200 09/11/17 1229     Response to Exercise   Blood Pressure (Admit)  128/70  104/60  110/70  110/60  108/64   Blood Pressure (Exercise)  122/70  110/62  130/70  124/70  110/60   Blood Pressure (Exit)  108/60  102/74  116/64  104/60  141/79   Heart Rate (Admit)  91 bpm  102 bpm  90 bpm  101 bpm  98 bpm   Heart Rate (Exercise)  110 bpm  126 bpm  106 bpm  115 bpm  114 bpm   Heart Rate (Exit)  96 bpm  118 bpm  90 bpm  95 bpm  103 bpm   Oxygen Saturation (Admit)  91 %  96 %  90 %  96 %  93 %   Oxygen Saturation (Exercise)  87 %  87 %  89 %  81 % O2 increased to15L sat improved to  92%  89 % O2 increased to15L sat improved to  92%   Oxygen Saturation (Exit)  97 %  94 %  98 %  96 %  97 %   Rating of Perceived Exertion (Exercise)  '13  17  15  15  15   '$ Perceived Dyspnea (Exercise)  '3  3  2  3  3   '$ Duration  Progress to 45 minutes of aerobic exercise without signs/symptoms of physical distress  Progress to 45 minutes of aerobic exercise without signs/symptoms of physical distress  Progress to 45 minutes of aerobic exercise without signs/symptoms of physical distress  Progress to 45 minutes of aerobic exercise without signs/symptoms of physical distress  Progress to 45 minutes of aerobic exercise without signs/symptoms of physical distress   Intensity  THRR unchanged  THRR unchanged  THRR unchanged  THRR unchanged  THRR unchanged     Progression   Progression  Continue to progress workloads to maintain intensity without signs/symptoms of physical distress.  Continue to  progress workloads to maintain intensity without signs/symptoms of physical distress.  Continue to progress workloads to maintain intensity without signs/symptoms of physical distress.  Continue to progress workloads to maintain intensity without signs/symptoms of physical distress.  Continue to progress workloads to maintain intensity without signs/symptoms of physical distress.     Resistance Training   Training Prescription  Yes  Yes  Yes  Yes  Yes   Weight  blue bands  blue bands  blue bands  blue bands  blue bands   Reps  10-15  10-15  10-15  10-15  10-15   Time  10 Minutes  10 Minutes  10 Minutes  10 Minutes  10 Minutes     Oxygen   Oxygen  Continuous  Continuous  Continuous  Continuous  Continuous   Liters  '10  10  10  '$ 6-15  6-10     NuStep   Level  '6  6  6  6  6   '$ Minutes  '17  17  17  17  17   '$ METs  2.8  2.5  2.6  2.5  2.5     Arm Ergometer   Level  -  '6  6  6  '$ 6  Minutes  -  '17  17  17  17     '$ Track   Laps  7  9  -  9.5  7   Minutes  17  17  -  17  17     Home Exercise Plan   Plans to continue exercise at  -  Home (comment)  -  -  -   Frequency  -  Add 2 additional days to program exercise sessions.  -  -  -   Row Name 09/13/17 1341 09/18/17 1200           Response to Exercise   Blood Pressure (Admit)  122/60  108/70      Blood Pressure (Exercise)  120/70  100/60      Blood Pressure (Exit)  100/50  100/50      Heart Rate (Admit)  89 bpm  90 bpm      Heart Rate (Exercise)  110 bpm  118 bpm      Heart Rate (Exit)  86 bpm  101 bpm      Oxygen Saturation (Admit)  93 %  93 %      Oxygen Saturation (Exercise)  89 % O2 increased to15L sat improved to  92%  89 %      Oxygen Saturation (Exit)  97 %  97 %      Rating of Perceived Exertion (Exercise)  13  13      Perceived Dyspnea (Exercise)  3  3      Duration  Progress to 45 minutes of aerobic exercise without signs/symptoms of physical distress  Progress to 45 minutes of aerobic exercise without signs/symptoms of  physical distress      Intensity  THRR unchanged  THRR unchanged        Progression   Progression  Continue to progress workloads to maintain intensity without signs/symptoms of physical distress.  Continue to progress workloads to maintain intensity without signs/symptoms of physical distress.        Resistance Training   Training Prescription  Yes  Yes      Weight  blue bands  blue bands      Reps  10-15  10-15      Time  10 Minutes  10 Minutes        Oxygen   Oxygen  Continuous  Continuous      Liters  6-10  6-10        NuStep   Level  -  6      Minutes  -  17      METs  -  2.6        Arm Ergometer   Level  6  6      Minutes  17  17        Track   Laps  8  8      Minutes  17  17         Exercise Comments: Exercise Comments    Row Name 08/30/17 0739           Exercise Comments  Home exercise completed          Exercise Goals and Review: Exercise Goals    Row Name 05/28/17 1447             Exercise Goals   Increase Physical Activity  Yes       Intervention  Provide advice, education, support and counseling about physical activity/exercise needs.;Develop an  individualized exercise prescription for aerobic and resistive training based on initial evaluation findings, risk stratification, comorbidities and participant's personal goals.       Expected Outcomes  Achievement of increased cardiorespiratory fitness and enhanced flexibility, muscular endurance and strength shown through measurements of functional capacity and personal statement of participant.       Increase Strength and Stamina  Yes       Intervention  Provide advice, education, support and counseling about physical activity/exercise needs.;Develop an individualized exercise prescription for aerobic and resistive training based on initial evaluation findings, risk stratification, comorbidities and participant's personal goals.       Expected Outcomes  Achievement of increased cardiorespiratory fitness  and enhanced flexibility, muscular endurance and strength shown through measurements of functional capacity and personal statement of participant.          Exercise Goals Re-Evaluation : Exercise Goals Re-Evaluation    Row Name 06/12/17 0830 07/02/17 1152 08/06/17 1005 08/30/17 1604 09/18/17 0758     Exercise Goal Re-Evaluation   Exercise Goals Review  Increase Physical Activity;Increase Strength and Stamina;Able to understand and use Dyspnea scale;Able to understand and use rate of perceived exertion (RPE) scale;Knowledge and understanding of Target Heart Rate Range (THRR);Understanding of Exercise Prescription  Increase Strength and Stamina;Increase Physical Activity;Able to understand and use Dyspnea scale;Able to understand and use rate of perceived exertion (RPE) scale;Knowledge and understanding of Target Heart Rate Range (THRR);Understanding of Exercise Prescription  Increase Physical Activity;Increase Strength and Stamina;Able to understand and use Dyspnea scale;Able to understand and use rate of perceived exertion (RPE) scale;Knowledge and understanding of Target Heart Rate Range (THRR);Understanding of Exercise Prescription  Increase Strength and Stamina;Increase Physical Activity;Able to understand and use rate of perceived exertion (RPE) scale;Able to understand and use Dyspnea scale;Knowledge and understanding of Target Heart Rate Range (THRR);Understanding of Exercise Prescription  Increase Strength and Stamina;Increase Physical Activity;Able to understand and use rate of perceived exertion (RPE) scale;Able to understand and use Dyspnea scale;Knowledge and understanding of Target Heart Rate Range (THRR);Understanding of Exercise Prescription   Comments  Patient has only attended one exercise session. Will cont to progress as able.   Patient is progressing slow and steady in program. Patient has been desaturating on 8 liters. Permission has been granted from Dr. Lamonte Sakai to increase liter flow to  titrate above 88%. Will cont to monitor and progress as able.   Patient is progressing slow and steady in program. When he tries to push himself his oxygen saturation decreases to unsafe levels even on 10 liters. We have titrated him to 15 liters. Oxymizer pendant has been ordered. Motivated to work hard and make changes although he is limited by his shortness of breath and deconditioning.   Patient is progressing slow and steady in the program. He works hard while he is here. Has adequate oxygen at home for exercise. Has started his home exercise routine. Will cont. to monitor and progress.   Patient is progressing slow and steady in the program. He works hard while he is here. Has adequate oxygen at home for exercise. Has started his home exercise routine. Has been extended out to 36 sessions in Pulmonary Rehab. When we get closer to graduation, I will encourage patient to come to maintenance. Will cont. to monitor and progress.    Expected Outcomes  Through exercise and education at rehab and at home, patient will increase strength and stamina. Patient will also gain a better understanding of the need for physical activity on a daily basis  and the effects it can have on quality of life.  Through exercise and education at rehab and at home, patient will increase strength and stamina. Patient will also gain a better understanding of the need for physical activity on a daily basis and the effects it can have on quality of life.  Through exercise and education at rehab and at home, patient will increase strength and stamina. Patient will also gain a better understanding of the need for physical activity on a daily basis and the effects it can have on quality of life.  Through exercise at rehab and at home, patient will increase strength and stamina making ADL's easier to perform. Patient will also have a better understanding of safe exercise and what they are capable to do outside of clinical supervision.  Through  exercise at rehab and at home, patient will increase strength and stamina making ADL's easier to perform. Patient will also have a better understanding of safe exercise and what they are capable to do outside of clinical supervision.      Discharge Exercise Prescription (Final Exercise Prescription Changes): Exercise Prescription Changes - 09/18/17 1200      Response to Exercise   Blood Pressure (Admit)  108/70    Blood Pressure (Exercise)  100/60    Blood Pressure (Exit)  100/50    Heart Rate (Admit)  90 bpm    Heart Rate (Exercise)  118 bpm    Heart Rate (Exit)  101 bpm    Oxygen Saturation (Admit)  93 %    Oxygen Saturation (Exercise)  89 %    Oxygen Saturation (Exit)  97 %    Rating of Perceived Exertion (Exercise)  13    Perceived Dyspnea (Exercise)  3    Duration  Progress to 45 minutes of aerobic exercise without signs/symptoms of physical distress    Intensity  THRR unchanged      Progression   Progression  Continue to progress workloads to maintain intensity without signs/symptoms of physical distress.      Resistance Training   Training Prescription  Yes    Weight  blue bands    Reps  10-15    Time  10 Minutes      Oxygen   Oxygen  Continuous    Liters  6-10      NuStep   Level  6    Minutes  17    METs  2.6      Arm Ergometer   Level  6    Minutes  17      Track   Laps  8    Minutes  17       Nutrition:  Target Goals: Understanding of nutrition guidelines, daily intake of sodium '1500mg'$ , cholesterol '200mg'$ , calories 30% from fat and 7% or less from saturated fats, daily to have 5 or more servings of fruits and vegetables.  Biometrics: Pre Biometrics - 05/28/17 1509      Pre Biometrics   Grip Strength  36 kg        Nutrition Therapy Plan and Nutrition Goals:   Nutrition Discharge: Rate Your Plate Scores:   Nutrition Goals Re-Evaluation:   Nutrition Goals Discharge (Final Nutrition Goals Re-Evaluation):   Psychosocial: Target Goals:  Acknowledge presence or absence of significant depression and/or stress, maximize coping skills, provide positive support system. Participant is able to verbalize types and ability to use techniques and skills needed for reducing stress and depression.  Initial Review & Psychosocial Screening: Initial Psych  Review & Screening - 05/28/17 1503      Initial Review   Current issues with  None Identified      Family Dynamics   Good Support System?  Yes      Barriers   Psychosocial barriers to participate in program  There are no identifiable barriers or psychosocial needs.      Screening Interventions   Interventions  Encouraged to exercise       Quality of Life Scores:   PHQ-9: Recent Review Flowsheet Data    Depression screen [facility] 2/9 05/28/2017   Decreased Interest 0   Down, Depressed, Hopeless 0   PHQ - 2 Score 0   Altered sleeping 0   Tired, decreased energy 0   Change in appetite 0   Feeling bad or failure about yourself  0   Trouble concentrating 0   Moving slowly or fidgety/restless 0   Suicidal thoughts 0   PHQ-9 Score 0   Difficult doing work/chores Not difficult at all     Interpretation of Total Score  Total Score Depression Severity:  1-4 = Minimal depression, 5-9 = Mild depression, 10-14 = Moderate depression, 15-19 = Moderately severe depression, 20-27 = Severe depression   Psychosocial Evaluation and Intervention: Psychosocial Evaluation - 05/28/17 1506      Psychosocial Evaluation & Interventions   Interventions  Encouraged to exercise with the program and follow exercise prescription    Expected Outcomes  patient will remain free from psychosocial barriers to participation    Continue Psychosocial Services   No Follow up required       Psychosocial Re-Evaluation: Psychosocial Re-Evaluation    [place] Name 06/13/17 1820 07/06/17 1223 08/05/17 2122 08/27/17 1530 09/18/17 1425     Psychosocial Re-Evaluation   Current issues with  None Identified  None  Identified  None Identified  None Identified  None Identified   Expected Outcomes  patient will remain free from psychosocial barriers to participation in pulmonary rehab   patient will remain free from psychosocial barriers to participation in pulmonary rehab   patient will remain free from psychosocial barriers to participation in pulmonary rehab   patient will remain free from psychosocial barriers to participation in pulmonary rehab   patient will remain free from psychosocial barriers to participation in pulmonary rehab    Interventions  -  -  -  Encouraged to attend Pulmonary Rehabilitation for the exercise  Encouraged to attend Pulmonary Rehabilitation for the exercise   Continue Psychosocial Services   No Follow up required  No Follow up required  No Follow up required  No Follow up required  No Follow up required      Psychosocial Discharge (Final Psychosocial Re-Evaluation): Psychosocial Re-Evaluation - 09/18/17 1425      Psychosocial Re-Evaluation   Current issues with  None Identified    Expected Outcomes  patient will remain free from psychosocial barriers to participation in pulmonary rehab     Interventions  Encouraged to attend Pulmonary Rehabilitation for the exercise    Continue Psychosocial Services   No Follow up required       Education: Education Goals: Education classes will be provided on a weekly basis, covering required topics. Participant will state understanding/return demonstration of topics presented.  Learning Barriers/Preferences: Learning Barriers/Preferences - 05/28/17 1502      Learning Barriers/Preferences   Learning Barriers  None    Learning Preferences  Computer/Internet;Group Instruction;Individual Instruction;Written Material       Education Topics: Risk Factor Reduction:  -Group  instruction that is supported by a PowerPoint presentation. Instructor discusses the definition of a risk factor, different risk factors for pulmonary disease, and how  the heart and lungs work together.     Nutrition for Pulmonary Patient:  -Group instruction provided by PowerPoint slides, verbal discussion, and written materials to support subject matter. The instructor gives an explanation and review of healthy diet recommendations, which includes a discussion on weight management, recommendations for fruit and vegetable consumption, as well as protein, fluid, caffeine, fiber, sodium, sugar, and alcohol. Tips for eating when patients are short of breath are discussed.   PULMONARY REHAB OTHER RESPIRATORY from 09/13/2017 in [place]  Date  08/02/17  Educator  Nutritionist  Instruction Review Code  2- meets goals/outcomes      Pursed Lip Breathing:  -Group instruction that is supported by demonstration and informational handouts. Instructor discusses the benefits of pursed lip and diaphragmatic breathing and detailed demonstration on how to preform both.     Oxygen Safety:  -Group instruction provided by PowerPoint, verbal discussion, and written material to support subject matter. There is an overview of "What is Oxygen" and "Why do we need it".  Instructor also reviews how to create a safe environment for oxygen use, the importance of using oxygen as prescribed, and the risks of noncompliance. There is a brief discussion on traveling with oxygen and resources the patient may utilize.   Oxygen Equipment:  -Group instruction provided by [facility] Staff utilizing handouts, written materials, and equipment demonstrations.   PULMONARY REHAB OTHER RESPIRATORY from 09/13/2017 in [place]  Date  09/13/17  Educator  [facility]  Instruction Review Code  2- meets goals/outcomes      Signs and Symptoms:  -Group instruction provided by written material and verbal discussion to support subject matter. Warning signs and symptoms of infection, stroke, and heart attack are reviewed and when to  call the physician/911 reinforced. Tips for preventing the spread of infection discussed.   PULMONARY REHAB OTHER RESPIRATORY from 09/13/2017 in [place]  Date  07/05/17  Educator  RN  Instruction Review Code  2- meets goals/outcomes      Advanced Directives:  -Group instruction provided by verbal instruction and written material to support subject matter. Instructor reviews Advanced Directive laws and proper instruction for filling out document.   Pulmonary Video:  -Group video education that reviews the importance of medication and oxygen compliance, exercise, good nutrition, pulmonary hygiene, and pursed lip and diaphragmatic breathing for the pulmonary patient.   PULMONARY REHAB OTHER RESPIRATORY from 09/13/2017 in [place]  Date  07/12/17  Instruction Review Code  2- meets goals/outcomes      Exercise for the Pulmonary Patient:  -Group instruction that is supported by a PowerPoint presentation. Instructor discusses benefits of exercise, core components of exercise, frequency, duration, and intensity of an exercise routine, importance of utilizing pulse oximetry during exercise, safety while exercising, and options of places to exercise outside of rehab.     PULMONARY REHAB OTHER RESPIRATORY from 09/13/2017 in [place]  Date  08/23/17  Educator  ep  Instruction Review Code  2- meets goals/outcomes      Pulmonary Medications:  -Verbally interactive group education provided by instructor with focus on inhaled medications and proper administration.   PULMONARY REHAB OTHER RESPIRATORY from 09/13/2017 in [place]  Date  08/16/17  Educator  pharmacist  Instruction Review Code  R- Review/reinforce      Anatomy and Physiology of the Respiratory System and Intimacy:  -Group instruction provided by PowerPoint, verbal discussion, and written material  to support subject matter. Instructor reviews respiratory cycle and anatomical components of the respiratory system and their functions. Instructor also reviews differences in obstructive and restrictive respiratory diseases with examples of each. Intimacy, Sex, and Sexuality differences are reviewed with a discussion on how relationships can change when diagnosed with pulmonary disease. Common sexual concerns are reviewed.   MD DAY -A group question and answer session with a medical doctor that allows participants to ask questions that relate to their pulmonary disease state.   PULMONARY REHAB OTHER RESPIRATORY from 09/13/2017 in [place]  Date  07/24/17  Educator  [employer]  Instruction Review Code  2- meets goals/outcomes      OTHER EDUCATION -Group or individual verbal, written, or video instructions that support the educational goals of the pulmonary rehab program.   Knowledge Questionnaire Score: Knowledge Questionnaire Score - 05/31/17 0901      Knowledge Questionnaire Score   Pre Score  10/13       Core Components/Risk Factors/Patient Goals at Admission: Personal Goals and Risk Factors at Admission - 05/28/17 1503      Core Components/Risk Factors/Patient Goals on Admission   Improve shortness of breath with ADL's  Yes    Intervention  Provide education, individualized exercise plan and daily activity instruction to help decrease symptoms of SOB with activities of daily living.    Expected Outcomes  Short Term: Achieves a reduction of symptoms when performing activities of daily living.    Develop more efficient breathing techniques such as purse lipped breathing and diaphragmatic breathing; and practicing self-pacing with activity  Yes    Intervention  Provide education, demonstration and support about specific breathing techniuqes utilized for more efficient breathing. Include techniques such as pursed lipped breathing, diaphragmatic breathing  and self-pacing activity.    Expected Outcomes  Short Term: Participant will be able to demonstrate and use breathing techniques as needed throughout daily activities.    Increase knowledge of respiratory medications and ability to use respiratory devices properly   Yes    Intervention  Provide education and demonstration as needed of appropriate use of medications, inhalers, and oxygen therapy.    Expected Outcomes  Short Term: Achieves understanding of medications use. Understands that oxygen is a medication prescribed by physician. Demonstrates appropriate use of inhaler and oxygen therapy.       Core Components/Risk Factors/Patient Goals Review:  Goals and Risk Factor Review    Row Name 06/13/17 1819 07/06/17 1222 08/05/17 2120 08/27/17 1528 09/18/17 1424     Core Components/Risk Factors/Patient Goals Review   Personal Goals Review  Improve shortness of breath with ADL's;Develop more efficient breathing techniques such as purse lipped breathing and diaphragmatic breathing and practicing self-pacing with activity.;Increase knowledge of respiratory medications and ability to use respiratory devices properly.  Improve shortness of breath with ADL's;Develop more efficient breathing techniques such as purse lipped breathing and diaphragmatic breathing and practicing self-pacing with activity.;Increase knowledge of respiratory medications and ability to use respiratory devices properly.  Improve shortness of breath with ADL's;Develop more efficient breathing techniques such as purse lipped breathing and diaphragmatic breathing and practicing self-pacing with activity.;Increase knowledge of respiratory medications and ability to use respiratory devices properly.  Improve shortness of breath with ADL's;Develop more efficient breathing techniques such as purse lipped breathing and  diaphragmatic breathing and practicing self-pacing with activity.;Increase knowledge of respiratory medications and ability to  use respiratory devices properly.  Improve shortness of breath with ADL's;Develop more efficient breathing techniques such as purse lipped breathing and diaphragmatic breathing and practicing self-pacing with activity.;Increase knowledge of respiratory medications and ability to use respiratory devices properly.   Review  patient has only attended 2 sessions since admission. will evaluate progression towards goals over the next 30 days  patient is making slow progress towards goals related to the initial lack of oxygen orders for exercise. Now have order to maintain saturations 88 or greater with unlimited ammount of oxygen with exertion. He is using plb without cueing and paces himself while taking restbreaks with walking.  patient is now making greater progress in pulmonary rehab related to increase oxygen use parameters. He has perfected his PLB technique and is observed using it during all aspects of his exercise sessios.  patient states his shortness of breath with ADLs has improved. He is using PLB to decrease his shortness of breath during exertional activities. He has attended an education session on pulmonary medications taught by pharmacist and found it extremely informative.  patient states his shortness of breath with ADLs has improved. He is using PLB to decrease his shortness of breath during exertional activities. He has attended an education session on pulmonary medications taught by pharmacist and found it extremely informative. His exercise session have been extended to 36 to maximize his physical health.   Expected Outcomes  see admission outcomes  see admission outcomes  see admission outcomes  see admission outcomes  see admission outcomes      Core Components/Risk Factors/Patient Goals at Discharge (Final Review):  Goals and Risk Factor Review - 09/18/17 1424      Core Components/Risk Factors/Patient Goals Review   Personal Goals Review  Improve shortness of breath with ADL's;Develop  more efficient breathing techniques such as purse lipped breathing and diaphragmatic breathing and practicing self-pacing with activity.;Increase knowledge of respiratory medications and ability to use respiratory devices properly.    Review  patient states his shortness of breath with ADLs has improved. He is using PLB to decrease his shortness of breath during exertional activities. He has attended an education session on pulmonary medications taught by pharmacist and found it extremely informative. His exercise session have been extended to 36 to maximize his physical health.    Expected Outcomes  see admission outcomes       ITP Comments:   Comments: patient has attended 27 sessions since admission

## 2017-09-20 NOTE — Progress Notes (Signed)
Daily Session Note  Patient Details  Name: Charles Archer MRN: [id number] Date of Birth: 11/12/1935 Referring Provider:     Pulmonary Rehab Walk Test from 05/31/2017 in [place]  Referring Provider  Dr. Lamonte Sakai      Encounter Date: 09/20/2017  Check In: Session Check In - 09/20/17 1030      Check-In   Location  [facility]    Staff Present  Rosebud Poles, RN, BSN;Fouad Taul, MS, ACSM RCEP, Exercise Physiologist;Lisa Ysidro Evert, RN;Portia Rollene Rotunda, RN, BSN    Supervising physician immediately available to respond to emergencies  [facility] immediately available    Physician(s)  Dr. Starla Link    Medication changes reported      No    Fall or balance concerns reported     No    Tobacco Cessation  No Change    Warm-up and Cool-down  Performed as group-led instruction    Resistance Training Performed  Yes    VAD Patient?  No      Pain Assessment   Currently in Pain?  No/denies    Multiple Pain Sites  No       Capillary Blood Glucose: No results found for this or any previous visit (from the past 24 hour(s)).  Exercise Prescription Changes - 09/20/17 1200      Response to Exercise   Blood Pressure (Admit)  110/54    Blood Pressure (Exercise)  104/60    Blood Pressure (Exit)  94/50    Heart Rate (Admit)  108 bpm    Heart Rate (Exercise)  108 bpm    Heart Rate (Exit)  90 bpm    Oxygen Saturation (Admit)  93 %    Oxygen Saturation (Exercise)  92 %    Oxygen Saturation (Exit)  96 %    Rating of Perceived Exertion (Exercise)  13    Perceived Dyspnea (Exercise)  3    Duration  Progress to 45 minutes of aerobic exercise without signs/symptoms of physical distress    Intensity  THRR unchanged      Progression   Progression  Continue to progress workloads to maintain intensity without signs/symptoms of physical distress.      Resistance Training   Training Prescription  Yes    Weight  blue bands    Reps  10-15    Time  10  Minutes      Oxygen   Oxygen  Continuous    Liters  6-10      Arm Ergometer   Level  6    Minutes  17      Track   Laps  10    Minutes  17       Social History   Tobacco Use  Smoking Status Former Smoker  . Packs/day: 3.00  . Years: 30.00  . Pack years: 90.00  . Types: Cigarettes  . Last attempt to quit: 09/03/1989  . Years since quitting: 28.0  Smokeless Tobacco Never Used    Goals Met:  Exercise tolerated well No report of cardiac concerns or symptoms Strength training completed today  Goals Unmet:  Not Applicable  Comments: Service time is from 10:30a to 12:30p    Dr. Rush Farmer is Medical Director for Pulmonary Rehab at [facility].

## 2017-09-22 DIAGNOSIS — J449 Chronic obstructive pulmonary disease, unspecified: Secondary | ICD-10-CM | POA: Diagnosis not present

## 2017-09-25 ENCOUNTER — Encounter (HOSPITAL_COMMUNITY): Payer: PPO

## 2017-09-27 ENCOUNTER — Encounter (HOSPITAL_COMMUNITY)
Admission: RE | Admit: 2017-09-27 | Discharge: 2017-09-27 | Disposition: A | Discharge status: Home / Self Care | Payer: PPO | Source: Ambulatory Visit | Attending: Emergency Medicine | Admitting: Emergency Medicine

## 2017-09-27 VITALS — Wt 183.4 lb

## 2017-09-27 DIAGNOSIS — J449 Chronic obstructive pulmonary disease, unspecified: Secondary | ICD-10-CM | POA: Diagnosis not present

## 2017-09-27 DIAGNOSIS — J439 Emphysema, unspecified: Secondary | ICD-10-CM

## 2017-09-27 NOTE — Progress Notes (Signed)
Daily Session Note  Patient Details  Name: Charles Archer MRN: [id number] Date of Birth: 03/15/1936 Referring Provider:     Pulmonary Rehab Walk Test from 05/31/2017 in [place]  Referring Provider  Dr. Lamonte Sakai      Encounter Date: 09/27/2017  Check In: Session Check In - 09/27/17 1030      Check-In   Location  [facility]    Staff Present  Rosebud Poles, RN, BSN;Molly diVincenzo, MS, ACSM RCEP, Exercise Physiologist;Symphoni Helbling Rollene Rotunda, RN, Marga Melnick, RN, BSN    Supervising physician immediately available to respond to emergencies  [facility] immediately available    Physician(s)  Dr. Starla Link    Medication changes reported      No    Fall or balance concerns reported     No    Tobacco Cessation  No Change    Warm-up and Cool-down  Performed as group-led instruction    Resistance Training Performed  Yes    VAD Patient?  No      Pain Assessment   Multiple Pain Sites  No       Capillary Blood Glucose: No results found for this or any previous visit (from the past 24 hour(s)).  Exercise Prescription Changes - 09/27/17 1200      Response to Exercise   Blood Pressure (Admit)  100/60    Blood Pressure (Exercise)  112/54    Blood Pressure (Exit)  100/62    Heart Rate (Admit)  117 bpm    Heart Rate (Exercise)  114 bpm    Heart Rate (Exit)  103 bpm    Oxygen Saturation (Admit)  95 %    Oxygen Saturation (Exercise)  86 %    Oxygen Saturation (Exit)  97 %    Rating of Perceived Exertion (Exercise)  13    Perceived Dyspnea (Exercise)  3    Duration  Progress to 45 minutes of aerobic exercise without signs/symptoms of physical distress    Intensity  THRR unchanged      Progression   Progression  Continue to progress workloads to maintain intensity without signs/symptoms of physical distress.      Resistance Training   Training Prescription  Yes    Weight  blue bands    Reps  10-15    Time  10 Minutes      Oxygen   Oxygen  Continuous    Liters  6-10      NuStep   Level  6    Minutes  17    METs  2.7      Arm Ergometer   Level  6    Minutes  17      Track   Laps  10    Minutes  17       Social History   Tobacco Use  Smoking Status Former Smoker  . Packs/day: 3.00  . Years: 30.00  . Pack years: 90.00  . Types: Cigarettes  . Last attempt to quit: 09/03/1989  . Years since quitting: 28.0  Smokeless Tobacco Never Used    Goals Met:  Independence with exercise equipment Improved SOB with ADL's Using PLB without cueing & demonstrates good technique Exercise tolerated well No report of cardiac concerns or symptoms Strength training completed today  Goals Unmet:  O2 Sat  Comments: Service time is from 1030 to 1205   Dr. Rush Farmer is Medical Director for Pulmonary Rehab at [facility].

## 2017-10-02 ENCOUNTER — Encounter (HOSPITAL_COMMUNITY)
Admission: RE | Admit: 2017-10-02 | Discharge: 2017-10-02 | Disposition: A | Discharge status: Home / Self Care | Payer: PPO | Source: Ambulatory Visit | Attending: Emergency Medicine | Admitting: Emergency Medicine

## 2017-10-02 DIAGNOSIS — J439 Emphysema, unspecified: Secondary | ICD-10-CM | POA: Diagnosis not present

## 2017-10-02 NOTE — Progress Notes (Signed)
Daily Session Note  Patient Details  Name: Charles Archer MRN: [id number] Date of Birth: 07-07-1936 Referring Provider:     Pulmonary Rehab Walk Test from 05/31/2017 in [place]  Referring Provider  Dr. Lamonte Sakai      Encounter Date: 10/02/2017  Check In: Session Check In - 10/02/17 1431      Check-In   Location  [facility]    Staff Present  Su Hilt, MS, ACSM RCEP, Exercise Physiologist;Joan Leonia Reeves, RN, BSN;Suhaylah Wampole, RN;Portia Rollene Rotunda, RN, BSN    Supervising physician immediately available to respond to emergencies  [facility] immediately available    Physician(s)  Dr. Eliseo Squires    Medication changes reported      No    Fall or balance concerns reported     No    Tobacco Cessation  No Change    Warm-up and Cool-down  Performed as group-led instruction    Resistance Training Performed  Yes    VAD Patient?  No      Pain Assessment   Currently in Pain?  No/denies    Multiple Pain Sites  No       Capillary Blood Glucose: No results found for this or any previous visit (from the past 24 hour(s)).  Exercise Prescription Changes - 10/02/17 1400      Response to Exercise   Blood Pressure (Admit)  110/62    Blood Pressure (Exercise)  100/66    Blood Pressure (Exit)  106/60    Heart Rate (Admit)  109 bpm    Heart Rate (Exercise)  118 bpm    Heart Rate (Exit)  106 bpm    Oxygen Saturation (Admit)  98 %    Oxygen Saturation (Exercise)  90 %    Oxygen Saturation (Exit)  96 %    Rating of Perceived Exertion (Exercise)  13    Perceived Dyspnea (Exercise)  3    Duration  Progress to 45 minutes of aerobic exercise without signs/symptoms of physical distress    Intensity  THRR unchanged      Progression   Progression  Continue to progress workloads to maintain intensity without signs/symptoms of physical distress.      Resistance Training   Training Prescription  Yes    Weight  blue bands    Reps  10-15    Time  10  Minutes      Oxygen   Oxygen  Continuous    Liters  6-10      NuStep   Level  6    Minutes  17    METs  2.2      Arm Ergometer   Level  6    Minutes  17      Track   Laps  9    Minutes  17       Social History   Tobacco Use  Smoking Status Former Smoker  . Packs/day: 3.00  . Years: 30.00  . Pack years: 90.00  . Types: Cigarettes  . Last attempt to quit: 09/03/1989  . Years since quitting: 28.0  Smokeless Tobacco Never Used    Goals Met:  Exercise tolerated well No report of cardiac concerns or symptoms Strength training completed today  Goals Unmet:  Not Applicable  Comments: Service time is from 1030 to 1215    Dr. Rush Farmer is Medical Director for Pulmonary Rehab at [facility].

## 2017-10-04 ENCOUNTER — Encounter (HOSPITAL_COMMUNITY)
Admission: RE | Admit: 2017-10-04 | Discharge: 2017-10-04 | Disposition: A | Discharge status: Home / Self Care | Payer: PPO | Source: Ambulatory Visit | Attending: Emergency Medicine | Admitting: Emergency Medicine

## 2017-10-04 VITALS — Wt 185.0 lb

## 2017-10-04 DIAGNOSIS — J439 Emphysema, unspecified: Secondary | ICD-10-CM

## 2017-10-04 NOTE — Progress Notes (Signed)
Daily Session Note  Patient Details  Name: Charles Archer MRN: [id number] Date of Birth: [birth year] Referring Provider:     Pulmonary Rehab Walk Test from 05/31/2017 in [place]  Referring Provider  Dr. Lamonte Sakai      Encounter Date: 10/04/2017  Check In: Session Check In - 10/04/17 1119      Check-In   Location  [facility]    Staff Present  Su Hilt, MS, ACSM RCEP, Exercise Physiologist;Amandeep Hogston Ysidro Evert, RN;Portia Rollene Rotunda, RN, BSN    Supervising physician immediately available to respond to emergencies  [facility] immediately available    Physician(s)  Dr. Wendee Beavers    Medication changes reported      No    Fall or balance concerns reported     No    Tobacco Cessation  No Change    Warm-up and Cool-down  Performed as group-led instruction    Resistance Training Performed  Yes    VAD Patient?  No      Pain Assessment   Currently in Pain?  No/denies    Multiple Pain Sites  No       Capillary Blood Glucose: No results found for this or any previous visit (from the past 24 hour(s)).  Exercise Prescription Changes - 10/04/17 1300      Response to Exercise   Blood Pressure (Admit)  116/60    Blood Pressure (Exercise)  130/70    Blood Pressure (Exit)  92/56    Heart Rate (Admit)  106 bpm    Heart Rate (Exercise)  105 bpm    Heart Rate (Exit)  84 bpm    Oxygen Saturation (Admit)  98 %    Oxygen Saturation (Exercise)  92 %    Oxygen Saturation (Exit)  100 %    Rating of Perceived Exertion (Exercise)  13    Perceived Dyspnea (Exercise)  3    Duration  Progress to 45 minutes of aerobic exercise without signs/symptoms of physical distress    Intensity  THRR unchanged      Progression   Progression  Continue to progress workloads to maintain intensity without signs/symptoms of physical distress.      Resistance Training   Training Prescription  Yes    Weight  blue bands    Reps  10-15    Time  10 Minutes      Oxygen    Oxygen  Continuous    Liters  6-10      NuStep   Level  6    Minutes  17    METs  2.1      Arm Ergometer   Level  6    Minutes  17       Social History   Tobacco Use  Smoking Status Former Smoker  . Packs/day: 3.00  . Years: 30.00  . Pack years: 90.00  . Types: Cigarettes  . Last attempt to quit: 09/03/1989  . Years since quitting: 28.1  Smokeless Tobacco Never Used    Goals Met:  Exercise tolerated well No report of cardiac concerns or symptoms Strength training completed today  Goals Unmet:  Not Applicable  Comments: Service time is from 1030 to [place]   Dr. Rush Farmer is Medical Director for Pulmonary Rehab at [facility].

## 2017-10-11 ENCOUNTER — Encounter (HOSPITAL_COMMUNITY)
Admission: RE | Admit: 2017-10-11 | Discharge: 2017-10-11 | Disposition: A | Discharge status: Home / Self Care | Payer: PPO | Source: Ambulatory Visit | Attending: Emergency Medicine | Admitting: Emergency Medicine

## 2017-10-11 VITALS — Wt 185.2 lb

## 2017-10-11 DIAGNOSIS — J439 Emphysema, unspecified: Secondary | ICD-10-CM

## 2017-10-11 NOTE — Progress Notes (Signed)
Daily Session Note  Patient Details  Name: Charles Archer MRN: [id number] Date of Birth: Mar 08, 1936 Referring Provider:     Pulmonary Rehab Walk Test from 05/31/2017 in [place]  Referring Provider  Dr. Lamonte Sakai      Encounter Date: 10/11/2017  Check In: Session Check In - 10/11/17 1021      Check-In   Location  [facility]    Staff Present  Rosebud Poles, RN, BSN;Isaak Delmundo Ysidro Evert, RN;Portia Rollene Rotunda, RN, BSN    Supervising physician immediately available to respond to emergencies  [facility] immediately available    Physician(s)  Dr. Rockne Menghini    Medication changes reported      No    Fall or balance concerns reported     No    Tobacco Cessation  No Change    Warm-up and Cool-down  Performed as group-led instruction    Resistance Training Performed  Yes    VAD Patient?  No      Pain Assessment   Currently in Pain?  No/denies    Multiple Pain Sites  No       Capillary Blood Glucose: No results found for this or any previous visit (from the past 24 hour(s)).  Exercise Prescription Changes - 10/11/17 1200      Response to Exercise   Blood Pressure (Admit)  100/62    Blood Pressure (Exercise)  106/60    Blood Pressure (Exit)  92/60    Heart Rate (Admit)  109 bpm    Heart Rate (Exercise)  121 bpm    Heart Rate (Exit)  99 bpm    Oxygen Saturation (Admit)  97 %    Oxygen Saturation (Exercise)  88 %    Oxygen Saturation (Exit)  98 %    Rating of Perceived Exertion (Exercise)  13    Perceived Dyspnea (Exercise)  3    Duration  Progress to 45 minutes of aerobic exercise without signs/symptoms of physical distress    Intensity  THRR unchanged      Progression   Progression  Continue to progress workloads to maintain intensity without signs/symptoms of physical distress.      Resistance Training   Training Prescription  Yes    Weight  blue bands    Reps  10-15    Time  10 Minutes      Oxygen   Oxygen  Continuous    Liters   6-10      NuStep   Level  6    Minutes  17    METs  2.1      Arm Ergometer   Level  6    Minutes  17      Track   Laps  8    Minutes  17       Social History   Tobacco Use  Smoking Status Former Smoker  . Packs/day: 3.00  . Years: 30.00  . Pack years: 90.00  . Types: Cigarettes  . Last attempt to quit: 09/03/1989  . Years since quitting: 28.1  Smokeless Tobacco Never Used    Goals Met:  Exercise tolerated well No report of cardiac concerns or symptoms Strength training completed today  Goals Unmet:  Not Applicable  Comments: Service time is from 1030 to 1215    Dr. Rush Farmer is Medical Director for Pulmonary Rehab at [facility].

## 2017-10-11 NOTE — Progress Notes (Signed)
Pulmonary Individual Treatment Plan  Patient Details  Name: Charles Archer MRN: [id number] Date of Birth: [birth year] Referring Provider:     Pulmonary Rehab Walk Test from 05/31/2017 in [facility]  Referring Provider  Dr. Lamonte Sakai      Initial Encounter Date:    Pulmonary Rehab Walk Test from 05/31/2017 in [place]  Date  05/31/17  Referring Provider  Dr. Lamonte Sakai      Visit Diagnosis: Pulmonary emphysema, unspecified emphysema type ([place])  Patient's Home Medications on Admission:   Current Outpatient Medications:  .  acetaminophen (TYLENOL ARTHRITIS PAIN) 650 MG CR tablet, Take 650 mg by mouth every 8 (eight) hours as needed for pain., Disp: , Rfl:  .  albuterol (PROAIR HFA) 108 (90 Base) MCG/ACT inhaler, Inhale 2 puffs into the lungs every 6 (six) hours as needed for wheezing or shortness of breath., Disp: 1 Inhaler, Rfl: 5 .  allopurinol (ZYLOPRIM) 100 MG tablet, Take 100 mg by mouth daily., Disp: , Rfl:  .  aspirin 81 MG tablet, Take 81 mg by mouth daily., Disp: , Rfl:  .  B Complex-Folic Acid (SUPER B COMPLEX MAXI PO), Take 1 tablet by mouth daily. , Disp: , Rfl:  .  calcium carbonate (OS-CAL) 600 MG TABS tablet, Take 600 mg by mouth daily., Disp: , Rfl:  .  Cinnamon 500 MG capsule, Take 1,000 mg by mouth daily., Disp: , Rfl:  .  docusate sodium (COLACE) 50 MG capsule, Take 50 mg by mouth daily as needed for mild constipation. , Disp: , Rfl:  .  fluorouracil (EFUDEX) 5 % cream, Apply 5 % topically 2 (two) times daily as needed. , Disp: , Rfl:  .  fluticasone (FLONASE) 50 MCG/ACT nasal spray, PLACE 2 SPRAYS INTO BOTH NOSTRILS DAILY., Disp: 16 g, Rfl: 5 .  Ginkgo Biloba 40 MG TABS, Take 40 mg by mouth daily. , Disp: , Rfl:  .  ipratropium-albuterol (DUONEB) 0.5-2.5 (3) MG/3ML SOLN, Take 3 mLs by nebulization 4 (four) times daily., Disp: 360 mL, Rfl: 5 .  irbesartan (AVAPRO) 300 MG tablet, Take 1 tablet by mouth daily.,  Disp: , Rfl:  .  Misc Natural Products (OSTEO BI-FLEX ADV JOINT SHIELD PO), Take 1,500 mg by mouth daily., Disp: , Rfl:  .  Misc. Devices (ACAPELLA) MISC, Use as directed, Disp: 1 each, Rfl: 0 .  Multiple Vitamin (MULTIVITAMIN) capsule, Take 1 capsule by mouth daily., Disp: , Rfl:  .  Naproxen Sodium (ALEVE PO), Take 220 mg by mouth as needed (for pain). , Disp: , Rfl:  .  niacin 250 MG tablet, Take 250 mg by mouth at bedtime., Disp: , Rfl:  .  sildenafil (REVATIO) 20 MG tablet, Take 1 tablet by mouth daily., Disp: , Rfl:  .  tamsulosin (FLOMAX) 0.4 MG CAPS capsule, Take 1 capsule by mouth daily., Disp: , Rfl:  .  vitamin B-12 (CYANOCOBALAMIN) 1000 MCG tablet, Take 1,000 mcg by mouth daily., Disp: , Rfl:  .  zinc gluconate 50 MG tablet, Take 50 mg by mouth daily., Disp: , Rfl:   Past Medical History: Past Medical History:  Diagnosis Date  . Abnormal stress test    s/p cardiac cath 10/2013 - nonobstructive CAD with normal LV function  . CKD (chronic kidney disease)   . COPD (chronic obstructive pulmonary disease) ([place])   . Dyspnea on exertion   . Emphysema lung ([place])   . GERD (gastroesophageal reflux disease)   . Gout   .  HTN (hypertension)   . Hyperlipidemia     Tobacco Use: Social History   Tobacco Use  Smoking Status Former Smoker  . Packs/day: 3.00  . Years: 30.00  . Pack years: 90.00  . Types: Cigarettes  . Last attempt to quit: 09/03/1989  . Years since quitting: 28.1  Smokeless Tobacco Never Used    Labs: Recent Review Flowsheet Data    There is no flowsheet data to display.      Capillary Blood Glucose: No results found for: GLUCAP   Pulmonary Assessment Scores: Pulmonary Assessment Scores    Row Name 05/31/17 0901 05/31/17 1627       ADL UCSD   ADL Phase  Entry  Entry    SOB Score total  93  -      CAT Score   CAT Score  23 Entry  -      mMRC Score   mMRC Score  -  2       Pulmonary Function Assessment: Pulmonary Function Assessment -  05/28/17 1502      Breath   Bilateral Breath Sounds  Decreased    Shortness of Breath  Yes;Limiting activity       Exercise Target Goals:    Exercise Program Goal: Individual exercise prescription set with THRR, safety & activity barriers. Participant demonstrates ability to understand and report RPE using BORG scale, to self-measure pulse accurately, and to acknowledge the importance of the exercise prescription.  Exercise Prescription Goal: Starting with aerobic activity 30 plus minutes a day, 3 days per week for initial exercise prescription. Provide home exercise prescription and guidelines that participant acknowledges understanding prior to discharge.  Activity Barriers & Risk Stratification: Activity Barriers & Cardiac Risk Stratification - 05/28/17 1447      Activity Barriers & Cardiac Risk Stratification   Activity Barriers  Deconditioning;Shortness of Breath       6 Minute Walk: 6 Minute Walk    Row Name 05/31/17 1622         6 Minute Walk   Phase  Initial     Distance  400 feet     Walk Time  - 2 minutes 55 seconds     # of Rest Breaks  - 2 (3 minutes 5 seconds total)     MPH  0.75     METS  1.09     RPE  11     Perceived Dyspnea   1     Symptoms  Yes (comment)     Comments  WHEELCHAIR     Resting HR  107 bpm     Resting BP  125/83     Max Ex. HR  118 bpm     Max Ex. BP  135/78       Interval HR   Baseline HR (retired)  107     1 Minute HR  116     2 Minute HR  116     3 Minute HR  111     4 Minute HR  118     5 Minute HR  116     6 Minute HR  113     2 Minute Post HR  103     Interval Heart Rate?  Yes       Interval Oxygen   Interval Oxygen?  Yes     Baseline Oxygen Saturation %  91 %     Resting Liters of Oxygen  4 L     1 Minute  Oxygen Saturation %  81 %     1 Minute Liters of Oxygen  4 L     2 Minute Oxygen Saturation %  82 %     2 Minute Liters of Oxygen  4 L     3 Minute Oxygen Saturation %  88 %     3 Minute Liters of Oxygen  4 L      4 Minute Oxygen Saturation %  85 %     4 Minute Liters of Oxygen  4 L     5 Minute Oxygen Saturation %  81 %     5 Minute Liters of Oxygen  4 L     6 Minute Oxygen Saturation %  87 %     6 Minute Liters of Oxygen  4 L     2 Minute Post Oxygen Saturation %  91 %     2 Minute Post Liters of Oxygen  4 L        Oxygen Initial Assessment: Oxygen Initial Assessment - 05/31/17 1611      Initial 6 min Walk   Oxygen Used  Continuous;E-Tanks    Liters per minute  4    Resting Oxygen Saturation   91 %    Exercise Oxygen Saturation  during 6 min walk  81 %      Program Oxygen Prescription   Program Oxygen Prescription  Continuous    Liters per minute  -- unsure since emphysema will contact Dr. Lamonte Sakai for further guidance       Oxygen Re-Evaluation: Oxygen Re-Evaluation    Row Name 06/13/17 1816 07/06/17 1219 08/05/17 2118 08/27/17 1508 08/28/17 1559     Program Oxygen Prescription   Program Oxygen Prescription  Continuous  Continuous  Continuous  Continuous  -   Liters per minute  '4  10  10  10  '$ -   Comments  he does desaturate on 4 liters and requires rest breaks while walking the track. will consult with MD regarding intolerance with ambulation on 4 liters  -  MD has approved increase liter flow to keep saturations >88%. continuing to work with MD office to secure patient appropriate oxygen equiptment for home exercise use  MD has approved increase liter flow to keep saturations >88%. continuing to work with MD office to secure patient appropriate oxygen equiptment for home exercise use  -     Home Oxygen   Home Oxygen Device  Home Concentrator;E-Tanks  Home Concentrator;E-Tanks  Home Concentrator;E-Tanks  -  -   Sleep Oxygen Prescription  Continuous  Continuous  Continuous  -  -   Liters per minute  '3  3  3  '$ -  -   Home Exercise Oxygen Prescription  Pulsed patient only has pulsed regulator on portable tank  Pulsed this is not adequate for home exercise and will work with MD to make  sure patient has sufficient oxygen to exercise.  -  -  Continuous   Liters per minute  4  -  -  -  10   Home at Rest Exercise Oxygen Prescription  Continuous  Continuous  Continuous  -  -   Liters per minute  -  -  3  -  -   Compliance with Home Oxygen Use  Yes  Yes  Yes  -  -     Goals/Expected Outcomes   Short Term Goals  To learn and exhibit compliance with exercise, home and travel O2 prescription;To learn  and understand importance of monitoring SPO2 with pulse oximeter and demonstrate accurate use of the pulse oximeter.;To learn and understand importance of maintaining oxygen saturations>88%;To learn and demonstrate proper pursed lip breathing techniques or other breathing techniques.;To learn and demonstrate proper use of respiratory medications  To learn and exhibit compliance with exercise, home and travel O2 prescription;To learn and understand importance of monitoring SPO2 with pulse oximeter and demonstrate accurate use of the pulse oximeter.;To learn and understand importance of maintaining oxygen saturations>88%;To learn and demonstrate proper pursed lip breathing techniques or other breathing techniques.;To learn and demonstrate proper use of respiratory medications  To learn and exhibit compliance with exercise, home and travel O2 prescription;To learn and understand importance of monitoring SPO2 with pulse oximeter and demonstrate accurate use of the pulse oximeter.;To learn and understand importance of maintaining oxygen saturations>88%;To learn and demonstrate proper pursed lip breathing techniques or other breathing techniques.;To learn and demonstrate proper use of respiratory medications  -  To learn and exhibit compliance with exercise, home and travel O2 prescription;To learn and understand importance of monitoring SPO2 with pulse oximeter and demonstrate accurate use of the pulse oximeter.;To learn and understand importance of maintaining oxygen saturations>88%;To learn and  demonstrate proper pursed lip breathing techniques or other breathing techniques.;To learn and demonstrate proper use of respiratory medications   Long  Term Goals  Exhibits compliance with exercise, home and travel O2 prescription;Verbalizes importance of monitoring SPO2 with pulse oximeter and return demonstration;Maintenance of O2 saturations>88%;Compliance with respiratory medication;Exhibits proper breathing techniques, such as pursed lip breathing or other method taught during program session  -  Exhibits compliance with exercise, home and travel O2 prescription;Verbalizes importance of monitoring SPO2 with pulse oximeter and return demonstration;Maintenance of O2 saturations>88%;Compliance with respiratory medication;Exhibits proper breathing techniques, such as pursed lip breathing or other method taught during program session  -  Exhibits compliance with exercise, home and travel O2 prescription;Verbalizes importance of monitoring SPO2 with pulse oximeter and return demonstration;Maintenance of O2 saturations>88%;Compliance with respiratory medication;Exhibits proper breathing techniques, such as pursed lip breathing or other method taught during program session   Comments  -  patient is compliant with home o2 prescription  patient is compliant with home o2 prescription  -  patient now has appropriate home oxygen for exercise and compliant with oxygen prescription. He has been supplied with 15 e-cylinders   Goals/Expected Outcomes  -  see above goals  see above goals  -  see above goals   Row Name 09/18/17 1422 10/11/17 0815           Program Oxygen Prescription   Program Oxygen Prescription  Continuous  Continuous      Liters per minute  10  10      Comments  MD has approved increase liter flow to keep saturations >88%. continuing to work with MD office to secure patient appropriate oxygen equiptment for home exercise use  MD has approved increase liter flow to keep saturations >88%.  continuing to work with MD office to secure patient appropriate oxygen equiptment for home exercise use        Home Oxygen   Home Oxygen Device  Home Concentrator;E-Tanks  Home Concentrator;E-Tanks      Sleep Oxygen Prescription  Continuous  Continuous      Liters per minute  3  3      Home Exercise Oxygen Prescription  Continuous  Continuous      Liters per minute  10  10      Home  at Rest Exercise Oxygen Prescription  Continuous  Continuous      Liters per minute  3  3      Compliance with Home Oxygen Use  Yes  Yes        Goals/Expected Outcomes   Short Term Goals  To learn and exhibit compliance with exercise, home and travel O2 prescription;To learn and understand importance of monitoring SPO2 with pulse oximeter and demonstrate accurate use of the pulse oximeter.;To learn and understand importance of maintaining oxygen saturations>88%;To learn and demonstrate proper pursed lip breathing techniques or other breathing techniques.;To learn and demonstrate proper use of respiratory medications  To learn and exhibit compliance with exercise, home and travel O2 prescription;To learn and understand importance of monitoring SPO2 with pulse oximeter and demonstrate accurate use of the pulse oximeter.;To learn and understand importance of maintaining oxygen saturations>88%;To learn and demonstrate proper pursed lip breathing techniques or other breathing techniques.;To learn and demonstrate proper use of respiratory medications      Long  Term Goals  Exhibits compliance with exercise, home and travel O2 prescription;Verbalizes importance of monitoring SPO2 with pulse oximeter and return demonstration;Maintenance of O2 saturations>88%;Compliance with respiratory medication;Exhibits proper breathing techniques, such as pursed lip breathing or other method taught during program session  -      Comments  patient now has appropriate home oxygen for exercise and compliant with oxygen prescription. He has been  supplied with 15 e-cylinders  patient now has appropriate home oxygen for exercise and compliant with oxygen prescription. He has been supplied with 15 e-cylinders      Goals/Expected Outcomes  see above goals  see above goals         Oxygen Discharge (Final Oxygen Re-Evaluation): Oxygen Re-Evaluation - 10/11/17 0815      Program Oxygen Prescription   Program Oxygen Prescription  Continuous    Liters per minute  10    Comments  MD has approved increase liter flow to keep saturations >88%. continuing to work with MD office to secure patient appropriate oxygen equiptment for home exercise use      Home Oxygen   Home Oxygen Device  Home Concentrator;E-Tanks    Sleep Oxygen Prescription  Continuous    Liters per minute  3    Home Exercise Oxygen Prescription  Continuous    Liters per minute  10    Home at Rest Exercise Oxygen Prescription  Continuous    Liters per minute  3    Compliance with Home Oxygen Use  Yes      Goals/Expected Outcomes   Short Term Goals  To learn and exhibit compliance with exercise, home and travel O2 prescription;To learn and understand importance of monitoring SPO2 with pulse oximeter and demonstrate accurate use of the pulse oximeter.;To learn and understand importance of maintaining oxygen saturations>88%;To learn and demonstrate proper pursed lip breathing techniques or other breathing techniques.;To learn and demonstrate proper use of respiratory medications    Comments  patient now has appropriate home oxygen for exercise and compliant with oxygen prescription. He has been supplied with 15 e-cylinders    Goals/Expected Outcomes  see above goals       Initial Exercise Prescription: Initial Exercise Prescription - 05/31/17 1600      Date of Initial Exercise RX and Referring Provider   Date  05/31/17    Referring Provider  Dr. Lamonte Sakai      Oxygen   Oxygen  Continuous    Liters  4 4  NuStep   Level  1    Minutes  34    METs  1.2      Arm  Ergometer   Level  1    Minutes  17      Prescription Details   Frequency (times per week)  2    Duration  Progress to 45 minutes of aerobic exercise without signs/symptoms of physical distress      Intensity   THRR 40-80% of Max Heartrate  56-113    Ratings of Perceived Exertion  11-13    Perceived Dyspnea  0-4      Resistance Training   Training Prescription  Yes    Weight  blue bands    Reps  10-15       Perform Capillary Blood Glucose checks as needed.  Exercise Prescription Changes: Exercise Prescription Changes    Row Name 06/07/17 1200 06/12/17 1200 06/14/17 1256 06/19/17 1224 06/21/17 1200     Response to Exercise   Blood Pressure (Admit)  142/70  112/60  110/62  104/68  112/60   Blood Pressure (Exercise)  126/62  138/62  140/80  110/60  116/50   Blood Pressure (Exit)  102/62  106/60  114/62  112/70  96/60   Heart Rate (Admit)  102 bpm  95 bpm  83 bpm  109 bpm  98 bpm   Heart Rate (Exercise)  107 bpm  100 bpm  106 bpm  119 bpm  102 bpm   Heart Rate (Exit)  84 bpm  80 bpm  85 bpm  104 bpm  83 bpm   Oxygen Saturation (Admit)  98 %  95 %  97 %  91 %  95 %   Oxygen Saturation (Exercise)  89 %  93 %  79 %  89 %  90 %   Oxygen Saturation (Exit)  97 %  98 %  98 %  99 %  100 %   Rating of Perceived Exertion (Exercise)  '13  12  15  15  13   '$ Perceived Dyspnea (Exercise)  '2  1  4  3  2   '$ Duration  Progress to 45 minutes of aerobic exercise without signs/symptoms of physical distress  Progress to 45 minutes of aerobic exercise without signs/symptoms of physical distress  Progress to 45 minutes of aerobic exercise without signs/symptoms of physical distress  Progress to 45 minutes of aerobic exercise without signs/symptoms of physical distress  Progress to 45 minutes of aerobic exercise without signs/symptoms of physical distress   Intensity  Other (comment) 40-80% of HRR  Other (comment) 40-80% of HRR  Other (comment) 40-80% of HRR  Other (comment) 40-80% of HRR  THRR unchanged      Progression   Progression  Continue to progress workloads to maintain intensity without signs/symptoms of physical distress.  Continue to progress workloads to maintain intensity without signs/symptoms of physical distress.  Continue to progress workloads to maintain intensity without signs/symptoms of physical distress.  Continue to progress workloads to maintain intensity without signs/symptoms of physical distress.  Continue to progress workloads to maintain intensity without signs/symptoms of physical distress.     Resistance Training   Training Prescription  Yes  Yes  Yes  Yes  Yes   Weight  blue bands  blue bands  blue bands  blue bands  blue bands   Reps  10-15  10-15  10-15  10-15  10-15   Time  10 Minutes  10 Minutes  10 Minutes  10 Minutes  10 Minutes     Oxygen   Oxygen  Continuous  Continuous  Continuous  Continuous  Continuous   Liters  '4  4  4  4  '$ 4-6     NuStep   Level  '2  2  2  3  3   '$ Minutes  17  34  '17  17  17   '$ METs  2.1  2  2.1  2.4  2.7     Arm Ergometer   Level  1  2  -  2  3   Minutes  17  17  -  17  17     Track   Laps  -  -  5  8  -   Minutes  -  -  17  17  -   Row Name 06/26/17 1200 06/28/17 1200 07/03/17 1632 07/05/17 1200 07/10/17 1200     Response to Exercise   Blood Pressure (Admit)  110/60  120/64  120/62  120/64  119/64   Blood Pressure (Exercise)  122/64  128/70  118/80  120/80  104/62   Blood Pressure (Exit)  102/60  102/62  100/60  106/64  90/50   Heart Rate (Admit)  101 bpm  108 bpm  103 bpm  88 bpm  98 bpm   Heart Rate (Exercise)  106 bpm  118 bpm  116 bpm  110 bpm  116 bpm   Heart Rate (Exit)  85 bpm  85 bpm  95 bpm  85 bpm  100 bpm   Oxygen Saturation (Admit)  93 %  90 %  95 %  97 %  94 %   Oxygen Saturation (Exercise)  83 %  83 % 4 liters increased to 8 liters  85 % desaturated on 10L  87 % desaturated on 10L  88 %   Oxygen Saturation (Exit)  98 %  100 %  85 %  97 %  99 %   Rating of Perceived Exertion (Exercise)  '13  13  13  13  14    '$ Perceived Dyspnea (Exercise)  '2  3  3  4  3   '$ Duration  Progress to 45 minutes of aerobic exercise without signs/symptoms of physical distress  Progress to 45 minutes of aerobic exercise without signs/symptoms of physical distress  Progress to 45 minutes of aerobic exercise without signs/symptoms of physical distress  Progress to 45 minutes of aerobic exercise without signs/symptoms of physical distress  Progress to 45 minutes of aerobic exercise without signs/symptoms of physical distress   Intensity  THRR unchanged  THRR unchanged  THRR unchanged  THRR unchanged  THRR unchanged     Progression   Progression  Continue to progress workloads to maintain intensity without signs/symptoms of physical distress.  Continue to progress workloads to maintain intensity without signs/symptoms of physical distress.  Continue to progress workloads to maintain intensity without signs/symptoms of physical distress.  Continue to progress workloads to maintain intensity without signs/symptoms of physical distress.  Continue to progress workloads to maintain intensity without signs/symptoms of physical distress.     Resistance Training   Training Prescription  Yes  Yes  Yes  Yes  Yes   Weight  blue bands  blue bands  blue bands  blue bands  blue bands   Reps  10-15  10-15  10-15  10-15  10-15   Time  10 Minutes  10 Minutes  10 Minutes  10 Minutes  10 Minutes     Oxygen   Oxygen  Continuous  Continuous  Continuous  Continuous  Continuous   Liters  4-6  4-'6  10  10  10     '$ NuStep   Level  '3  4  4  4  5   '$ Minutes  '17  17  17  17  17   '$ METs  -  2.4  2.8  2.6  2.2     Arm Ergometer   Level  -  3  3  -  4   Minutes  -  17  17  -  17     Track   Laps  '7  5  6  6  9   '$ Minutes  '17  17  17  17  17   '$ Row Name 07/12/17 1222 07/19/17 1300 07/24/17 1200 07/31/17 1200 08/02/17 1200     Response to Exercise   Blood Pressure (Admit)  118/56  106/58  130/74  120/68  142/70   Blood Pressure (Exercise)  104/58  118/62   150/82  128/80  144/70   Blood Pressure (Exit)  104/60  96/64  104/70  108/60  110/72   Heart Rate (Admit)  92 bpm  92 bpm  104 bpm  93 bpm  97 bpm   Heart Rate (Exercise)  107 bpm  109 bpm  1256 bpm  114 bpm  117 bpm   Heart Rate (Exit)  85 bpm  69 bpm  102 bpm  93 bpm  95 bpm   Oxygen Saturation (Admit)  97 %  94 %  94 %  99 %  92 %   Oxygen Saturation (Exercise)  85 %  87 %  89 %  85 %  81 % 10 liters 89% 15 liters   Oxygen Saturation (Exit)  99 %  93 %  97 %  87 %  98 %   Rating of Perceived Exertion (Exercise)  '13  13  14  13  14   '$ Perceived Dyspnea (Exercise)  '3  2  2  3  4   '$ Duration  Progress to 45 minutes of aerobic exercise without signs/symptoms of physical distress  Progress to 45 minutes of aerobic exercise without signs/symptoms of physical distress  Progress to 45 minutes of aerobic exercise without signs/symptoms of physical distress  Progress to 45 minutes of aerobic exercise without signs/symptoms of physical distress  Progress to 45 minutes of aerobic exercise without signs/symptoms of physical distress   Intensity  THRR unchanged  THRR unchanged  THRR unchanged  THRR unchanged  THRR unchanged     Progression   Progression  Continue to progress workloads to maintain intensity without signs/symptoms of physical distress.  Continue to progress workloads to maintain intensity without signs/symptoms of physical distress.  Continue to progress workloads to maintain intensity without signs/symptoms of physical distress.  Continue to progress workloads to maintain intensity without signs/symptoms of physical distress.  Continue to progress workloads to maintain intensity without signs/symptoms of physical distress.     Resistance Training   Training Prescription  Yes  Yes  Yes  Yes  Yes   Weight  blue bands  blue bands  blue bands  blue bands  blue bands   Reps  10-15  10-15  10-15  10-15  10-15   Time  10 Minutes  10 Minutes  10 Minutes  10 Minutes  10 Minutes     Oxygen   Oxygen  Continuous  Continuous  -  Continuous  Continuous   Liters  10  10  -  10  10     NuStep   Level  '6  6  6  6  '$ -   Minutes  '17  17  17  17  '$ -   METs  3.1  2.7  2.5  2.8  -     Arm Ergometer   Level  '4  4  6  6  6   '$ Minutes  '17  17  17  17  17     '$ Track   Laps  -  -  -  7  8   Minutes  -  -  -  17  17   Row Name 08/07/17 1200 08/09/17 1200 08/14/17 1210 08/16/17 1200 08/21/17 1200     Response to Exercise   Blood Pressure (Admit)  118/62  100/50  104/62  112/60  112/70   Blood Pressure (Exercise)  110/64  130/70  124/70  126/64  130/60   Blood Pressure (Exit)  98/62  110/70  104/66  108/82  108/64   Heart Rate (Admit)  91 bpm  91 bpm  87 bpm  91 bpm  110 bpm   Heart Rate (Exercise)  112 bpm  117 bpm  108 bpm  111 bpm  126 bpm   Heart Rate (Exit)  106 bpm  80 bpm  94 bpm  89 bpm  117 bpm   Oxygen Saturation (Admit)  98 %  97 %  96 %  95 %  94 %   Oxygen Saturation (Exercise)  88 % 10 liters 89% 15 liters  92 %  86 %  90 %  89 %   Oxygen Saturation (Exit)  97 %  97 %  98 %  98 %  95 %   Rating of Perceived Exertion (Exercise)  '15  13  11  14  13   '$ Perceived Dyspnea (Exercise)  '4  3  3  2  3   '$ Duration  Progress to 45 minutes of aerobic exercise without signs/symptoms of physical distress  Progress to 45 minutes of aerobic exercise without signs/symptoms of physical distress  Progress to 45 minutes of aerobic exercise without signs/symptoms of physical distress  Progress to 45 minutes of aerobic exercise without signs/symptoms of physical distress  Progress to 45 minutes of aerobic exercise without signs/symptoms of physical distress   Intensity  THRR unchanged  THRR unchanged  THRR unchanged  THRR unchanged  THRR unchanged     Progression   Progression  Continue to progress workloads to maintain intensity without signs/symptoms of physical distress.  Continue to progress workloads to maintain intensity without signs/symptoms of physical distress.  Continue to progress workloads to  maintain intensity without signs/symptoms of physical distress.  Continue to progress workloads to maintain intensity without signs/symptoms of physical distress.  Continue to progress workloads to maintain intensity without signs/symptoms of physical distress.     Resistance Training   Training Prescription  Yes  Yes  Yes  Yes  Yes   Weight  blue bands  blue bands  blue bands  blue bands  blue bands   Reps  10-15  10-15  10-15  10-15  10-15   Time  10 Minutes  10 Minutes  10 Minutes  10 Minutes  10 Minutes     Oxygen   Oxygen  Continuous  Continuous  Continuous  Continuous  Continuous  Liters  '10  10  10  10  10     '$ NuStep   Level  '6  6  6  '$ -  6   Minutes  '17  17  17  '$ -  17   METs  2.7  2.5  2.8  -  2.5     Arm Ergometer   Level  6  -  '6  6  6   '$ Minutes  17  -  '17  17  17     '$ Track   Laps  '8  8  7  8  7   '$ Minutes  '17  17  17  17  17   '$ Row Name 08/23/17 1300 08/28/17 1200 08/30/17 1200 09/04/17 1200 09/11/17 1229     Response to Exercise   Blood Pressure (Admit)  128/70  104/60  110/70  110/60  108/64   Blood Pressure (Exercise)  122/70  110/62  130/70  124/70  110/60   Blood Pressure (Exit)  108/60  102/74  116/64  104/60  141/79   Heart Rate (Admit)  91 bpm  102 bpm  90 bpm  101 bpm  98 bpm   Heart Rate (Exercise)  110 bpm  126 bpm  106 bpm  115 bpm  114 bpm   Heart Rate (Exit)  96 bpm  118 bpm  90 bpm  95 bpm  103 bpm   Oxygen Saturation (Admit)  91 %  96 %  90 %  96 %  93 %   Oxygen Saturation (Exercise)  87 %  87 %  89 %  81 % O2 increased to15L sat improved to  92%  89 % O2 increased to15L sat improved to  92%   Oxygen Saturation (Exit)  97 %  94 %  98 %  96 %  97 %   Rating of Perceived Exertion (Exercise)  '13  17  15  15  15   '$ Perceived Dyspnea (Exercise)  '3  3  2  3  3   '$ Duration  Progress to 45 minutes of aerobic exercise without signs/symptoms of physical distress  Progress to 45 minutes of aerobic exercise without signs/symptoms of physical distress  Progress to 45  minutes of aerobic exercise without signs/symptoms of physical distress  Progress to 45 minutes of aerobic exercise without signs/symptoms of physical distress  Progress to 45 minutes of aerobic exercise without signs/symptoms of physical distress   Intensity  THRR unchanged  THRR unchanged  THRR unchanged  THRR unchanged  THRR unchanged     Progression   Progression  Continue to progress workloads to maintain intensity without signs/symptoms of physical distress.  Continue to progress workloads to maintain intensity without signs/symptoms of physical distress.  Continue to progress workloads to maintain intensity without signs/symptoms of physical distress.  Continue to progress workloads to maintain intensity without signs/symptoms of physical distress.  Continue to progress workloads to maintain intensity without signs/symptoms of physical distress.     Resistance Training   Training Prescription  Yes  Yes  Yes  Yes  Yes   Weight  blue bands  blue bands  blue bands  blue bands  blue bands   Reps  10-15  10-15  10-15  10-15  10-15   Time  10 Minutes  10 Minutes  10 Minutes  10 Minutes  10 Minutes     Oxygen   Oxygen  Continuous  Continuous  Continuous  Continuous  Continuous  Liters  '10  10  10  '$ 6-15  6-10     NuStep   Level  '6  6  6  6  6   '$ Minutes  '17  17  17  17  17   '$ METs  2.8  2.5  2.6  2.5  2.5     Arm Ergometer   Level  -  '6  6  6  6   '$ Minutes  -  '17  17  17  17     '$ Track   Laps  7  9  -  9.5  7   Minutes  17  17  -  17  17     Home Exercise Plan   Plans to continue exercise at  -  Home (comment)  -  -  -   Frequency  -  Add 2 additional days to program exercise sessions.  -  -  -   Row Name 09/13/17 1341 09/18/17 1200 09/20/17 1200 09/27/17 1200 10/02/17 1400     Response to Exercise   Blood Pressure (Admit)  122/60  108/70  110/54  100/60  110/62   Blood Pressure (Exercise)  120/70  100/60  104/60  112/54  100/66   Blood Pressure (Exit)  100/50  100/50  94/50  100/62   106/60   Heart Rate (Admit)  89 bpm  90 bpm  108 bpm  117 bpm  109 bpm   Heart Rate (Exercise)  110 bpm  118 bpm  108 bpm  114 bpm  118 bpm   Heart Rate (Exit)  86 bpm  101 bpm  90 bpm  103 bpm  106 bpm   Oxygen Saturation (Admit)  93 %  93 %  93 %  95 %  98 %   Oxygen Saturation (Exercise)  89 % O2 increased to15L sat improved to  92%  89 %  92 %  86 %  90 %   Oxygen Saturation (Exit)  97 %  97 %  96 %  97 %  96 %   Rating of Perceived Exertion (Exercise)  '13  13  13  13  13   '$ Perceived Dyspnea (Exercise)  '3  3  3  3  3   '$ Duration  Progress to 45 minutes of aerobic exercise without signs/symptoms of physical distress  Progress to 45 minutes of aerobic exercise without signs/symptoms of physical distress  Progress to 45 minutes of aerobic exercise without signs/symptoms of physical distress  Progress to 45 minutes of aerobic exercise without signs/symptoms of physical distress  Progress to 45 minutes of aerobic exercise without signs/symptoms of physical distress   Intensity  THRR unchanged  THRR unchanged  THRR unchanged  THRR unchanged  THRR unchanged     Progression   Progression  Continue to progress workloads to maintain intensity without signs/symptoms of physical distress.  Continue to progress workloads to maintain intensity without signs/symptoms of physical distress.  Continue to progress workloads to maintain intensity without signs/symptoms of physical distress.  Continue to progress workloads to maintain intensity without signs/symptoms of physical distress.  Continue to progress workloads to maintain intensity without signs/symptoms of physical distress.     Resistance Training   Training Prescription  Yes  Yes  Yes  Yes  Yes   Weight  blue bands  blue bands  blue bands  blue bands  blue bands   Reps  10-15  10-15  10-15  10-15  10-15  Time  10 Minutes  10 Minutes  10 Minutes  10 Minutes  10 Minutes     Oxygen   Oxygen  Continuous  Continuous  Continuous  Continuous  Continuous    Liters  6-10  6-10  6-10  6-10  6-10     NuStep   Level  -  6  -  6  6   Minutes  -  17  -  17  17   METs  -  2.6  -  2.7  2.2     Arm Ergometer   Level  '6  6  6  6  6   '$ Minutes  '17  17  17  17  17     '$ Track   Laps  '8  8  10  10  9   '$ Minutes  '17  17  17  17  17   '$ Row Name 10/04/17 1300             Response to Exercise   Blood Pressure (Admit)  116/60       Blood Pressure (Exercise)  130/70       Blood Pressure (Exit)  92/56       Heart Rate (Admit)  106 bpm       Heart Rate (Exercise)  105 bpm       Heart Rate (Exit)  84 bpm       Oxygen Saturation (Admit)  98 %       Oxygen Saturation (Exercise)  92 %       Oxygen Saturation (Exit)  100 %       Rating of Perceived Exertion (Exercise)  13       Perceived Dyspnea (Exercise)  3       Duration  Progress to 45 minutes of aerobic exercise without signs/symptoms of physical distress       Intensity  THRR unchanged         Progression   Progression  Continue to progress workloads to maintain intensity without signs/symptoms of physical distress.         Resistance Training   Training Prescription  Yes       Weight  blue bands       Reps  10-15       Time  10 Minutes         Oxygen   Oxygen  Continuous       Liters  6-10         NuStep   Level  6       Minutes  17       METs  2.1         Arm Ergometer   Level  6       Minutes  17          Exercise Comments: Exercise Comments    Row Name 08/30/17 0739           Exercise Comments  Home exercise completed          Exercise Goals and Review: Exercise Goals    [place] Name 05/28/17 1447             Exercise Goals   Increase Physical Activity  Yes       Intervention  Provide advice, education, support and counseling about physical activity/exercise needs.;Develop an individualized exercise prescription for aerobic and resistive training based on initial evaluation findings, risk stratification, comorbidities and participant's personal goals.  Expected Outcomes  Achievement of increased cardiorespiratory fitness and enhanced flexibility, muscular endurance and strength shown through measurements of functional capacity and personal statement of participant.       Increase Strength and Stamina  Yes       Intervention  Provide advice, education, support and counseling about physical activity/exercise needs.;Develop an individualized exercise prescription for aerobic and resistive training based on initial evaluation findings, risk stratification, comorbidities and participant's personal goals.       Expected Outcomes  Achievement of increased cardiorespiratory fitness and enhanced flexibility, muscular endurance and strength shown through measurements of functional capacity and personal statement of participant.          Exercise Goals Re-Evaluation : Exercise Goals Re-Evaluation    Row Name 06/12/17 0830 07/02/17 1152 08/06/17 1005 08/30/17 1604 09/18/17 0758     Exercise Goal Re-Evaluation   Exercise Goals Review  Increase Physical Activity;Increase Strength and Stamina;Able to understand and use Dyspnea scale;Able to understand and use rate of perceived exertion (RPE) scale;Knowledge and understanding of Target Heart Rate Range (THRR);Understanding of Exercise Prescription  Increase Strength and Stamina;Increase Physical Activity;Able to understand and use Dyspnea scale;Able to understand and use rate of perceived exertion (RPE) scale;Knowledge and understanding of Target Heart Rate Range (THRR);Understanding of Exercise Prescription  Increase Physical Activity;Increase Strength and Stamina;Able to understand and use Dyspnea scale;Able to understand and use rate of perceived exertion (RPE) scale;Knowledge and understanding of Target Heart Rate Range (THRR);Understanding of Exercise Prescription  Increase Strength and Stamina;Increase Physical Activity;Able to understand and use rate of perceived exertion (RPE) scale;Able to understand and use  Dyspnea scale;Knowledge and understanding of Target Heart Rate Range (THRR);Understanding of Exercise Prescription  Increase Strength and Stamina;Increase Physical Activity;Able to understand and use rate of perceived exertion (RPE) scale;Able to understand and use Dyspnea scale;Knowledge and understanding of Target Heart Rate Range (THRR);Understanding of Exercise Prescription   Comments  Patient has only attended one exercise session. Will cont to progress as able.   Patient is progressing slow and steady in program. Patient has been desaturating on 8 liters. Permission has been granted from Dr. Lamonte Sakai to increase liter flow to titrate above 88%. Will cont to monitor and progress as able.   Patient is progressing slow and steady in program. When he tries to push himself his oxygen saturation decreases to unsafe levels even on 10 liters. We have titrated him to 15 liters. Oxymizer pendant has been ordered. Motivated to work hard and make changes although he is limited by his shortness of breath and deconditioning.   Patient is progressing slow and steady in the program. He works hard while he is here. Has adequate oxygen at home for exercise. Has started his home exercise routine. Will cont. to monitor and progress.   Patient is progressing slow and steady in the program. He works hard while he is here. Has adequate oxygen at home for exercise. Has started his home exercise routine. Has been extended out to 36 sessions in Pulmonary Rehab. When we get closer to graduation, I will encourage patient to come to maintenance. Will cont. to monitor and progress.    Expected Outcomes  Through exercise and education at rehab and at home, patient will increase strength and stamina. Patient will also gain a better understanding of the need for physical activity on a daily basis and the effects it can have on quality of life.  Through exercise and education at rehab and at home, patient will increase strength and stamina.  Patient will also gain a better understanding of the need for physical activity on a daily basis and the effects it can have on quality of life.  Through exercise and education at rehab and at home, patient will increase strength and stamina. Patient will also gain a better understanding of the need for physical activity on a daily basis and the effects it can have on quality of life.  Through exercise at rehab and at home, patient will increase strength and stamina making ADL's easier to perform. Patient will also have a better understanding of safe exercise and what they are capable to do outside of clinical supervision.  Through exercise at rehab and at home, patient will increase strength and stamina making ADL's easier to perform. Patient will also have a better understanding of safe exercise and what they are capable to do outside of clinical supervision.   [place] Name 10/04/17 [phone]             Exercise Goal Re-Evaluation   Exercise Goals Review  Increase Strength and Stamina;Increase Physical Activity;Able to understand and use Dyspnea scale;Able to understand and use rate of perceived exertion (RPE) scale;Knowledge and understanding of Target Heart Rate Range (THRR);Understanding of Exercise Prescription       Comments  Patient is progressing slow and steady in the program. He works hard while he is here. Has adequate oxygen at home for exercise. Has started his home exercise routine. Has been extended out to 36 sessions in Pulmonary Rehab. When we get closer to graduation, I will encourage patient to come to maintenance. Will cont. to monitor and progress.        Expected Outcomes  Through exercise at rehab and at home, patient will increase strength and stamina making ADL's easier to perform. Patient will also have a better understanding of safe exercise and what they are capable to do outside of clinical supervision.          Discharge Exercise Prescription (Final Exercise Prescription  Changes): Exercise Prescription Changes - 10/04/17 1300      Response to Exercise   Blood Pressure (Admit)  116/60    Blood Pressure (Exercise)  130/70    Blood Pressure (Exit)  92/56    Heart Rate (Admit)  106 bpm    Heart Rate (Exercise)  105 bpm    Heart Rate (Exit)  84 bpm    Oxygen Saturation (Admit)  98 %    Oxygen Saturation (Exercise)  92 %    Oxygen Saturation (Exit)  100 %    Rating of Perceived Exertion (Exercise)  13    Perceived Dyspnea (Exercise)  3    Duration  Progress to 45 minutes of aerobic exercise without signs/symptoms of physical distress    Intensity  THRR unchanged      Progression   Progression  Continue to progress workloads to maintain intensity without signs/symptoms of physical distress.      Resistance Training   Training Prescription  Yes    Weight  blue bands    Reps  10-15    Time  10 Minutes      Oxygen   Oxygen  Continuous    Liters  6-10      NuStep   Level  6    Minutes  17    METs  2.1      Arm Ergometer   Level  6    Minutes  17       Nutrition:  Target Goals: Understanding  of nutrition guidelines, daily intake of sodium '1500mg'$ , cholesterol '200mg'$ , calories 30% from fat and 7% or less from saturated fats, daily to have 5 or more servings of fruits and vegetables.  Biometrics: Pre Biometrics - 05/28/17 1509      Pre Biometrics   Grip Strength  36 kg        Nutrition Therapy Plan and Nutrition Goals:   Nutrition Discharge: Rate Your Plate Scores:   Nutrition Goals Re-Evaluation:   Nutrition Goals Discharge (Final Nutrition Goals Re-Evaluation):   Psychosocial: Target Goals: Acknowledge presence or absence of significant depression and/or stress, maximize coping skills, provide positive support system. Participant is able to verbalize types and ability to use techniques and skills needed for reducing stress and depression.  Initial Review & Psychosocial Screening: Initial Psych Review & Screening - 05/28/17  1503      Initial Review   Current issues with  None Identified      Family Dynamics   Good Support System?  Yes      Barriers   Psychosocial barriers to participate in program  There are no identifiable barriers or psychosocial needs.      Screening Interventions   Interventions  Encouraged to exercise       Quality of Life Scores:   PHQ-9: Recent Review Flowsheet Data    Depression screen [facility] 2/9 05/28/2017   Decreased Interest 0   Down, Depressed, Hopeless 0   PHQ - 2 Score 0   Altered sleeping 0   Tired, decreased energy 0   Change in appetite 0   Feeling bad or failure about yourself  0   Trouble concentrating 0   Moving slowly or fidgety/restless 0   Suicidal thoughts 0   PHQ-9 Score 0   Difficult doing work/chores Not difficult at all     Interpretation of Total Score  Total Score Depression Severity:  1-4 = Minimal depression, 5-9 = Mild depression, 10-14 = Moderate depression, 15-19 = Moderately severe depression, 20-27 = Severe depression   Psychosocial Evaluation and Intervention: Psychosocial Evaluation - 05/28/17 1506      Psychosocial Evaluation & Interventions   Interventions  Encouraged to exercise with the program and follow exercise prescription    Expected Outcomes  patient will remain free from psychosocial barriers to participation    Continue Psychosocial Services   No Follow up required       Psychosocial Re-Evaluation: Psychosocial Re-Evaluation    [place] Name 06/13/17 1820 07/06/17 1223 08/05/17 2122 08/27/17 1530 09/18/17 1425     Psychosocial Re-Evaluation   Current issues with  None Identified  None Identified  None Identified  None Identified  None Identified   Expected Outcomes  patient will remain free from psychosocial barriers to participation in pulmonary rehab   patient will remain free from psychosocial barriers to participation in pulmonary rehab   patient will remain free from psychosocial barriers to participation in pulmonary  rehab   patient will remain free from psychosocial barriers to participation in pulmonary rehab   patient will remain free from psychosocial barriers to participation in pulmonary rehab    Interventions  -  -  -  Encouraged to attend Pulmonary Rehabilitation for the exercise  Encouraged to attend Pulmonary Rehabilitation for the exercise   Continue Psychosocial Services   No Follow up required  No Follow up required  No Follow up required  No Follow up required  No Follow up required   [place] Name 10/11/17 [phone]  Psychosocial Re-Evaluation   Current issues with  None Identified       Expected Outcomes  patient will remain free from psychosocial barriers to participation in pulmonary rehab        Interventions  Encouraged to attend Pulmonary Rehabilitation for the exercise       Continue Psychosocial Services   No Follow up required          Psychosocial Discharge (Final Psychosocial Re-Evaluation): Psychosocial Re-Evaluation - 10/11/17 0816      Psychosocial Re-Evaluation   Current issues with  None Identified    Expected Outcomes  patient will remain free from psychosocial barriers to participation in pulmonary rehab     Interventions  Encouraged to attend Pulmonary Rehabilitation for the exercise    Continue Psychosocial Services   No Follow up required       Education: Education Goals: Education classes will be provided on a weekly basis, covering required topics. Participant will state understanding/return demonstration of topics presented.  Learning Barriers/Preferences: Learning Barriers/Preferences - 05/28/17 1502      Learning Barriers/Preferences   Learning Barriers  None    Learning Preferences  Computer/Internet;Group Instruction;Individual Instruction;Written Material       Education Topics: Risk Factor Reduction:  -Group instruction that is supported by a PowerPoint presentation. Instructor discusses the definition of a risk factor, different risk factors  for pulmonary disease, and how the heart and lungs work together.     Nutrition for Pulmonary Patient:  -Group instruction provided by PowerPoint slides, verbal discussion, and written materials to support subject matter. The instructor gives an explanation and review of healthy diet recommendations, which includes a discussion on weight management, recommendations for fruit and vegetable consumption, as well as protein, fluid, caffeine, fiber, sodium, sugar, and alcohol. Tips for eating when patients are short of breath are discussed.   PULMONARY REHAB OTHER RESPIRATORY from 09/20/2017 in [place]  Date  08/02/17  Educator  Nutritionist  Instruction Review Code  2- meets goals/outcomes      Pursed Lip Breathing:  -Group instruction that is supported by demonstration and informational handouts. Instructor discusses the benefits of pursed lip and diaphragmatic breathing and detailed demonstration on how to preform both.     Oxygen Safety:  -Group instruction provided by PowerPoint, verbal discussion, and written material to support subject matter. There is an overview of "What is Oxygen" and "Why do we need it".  Instructor also reviews how to create a safe environment for oxygen use, the importance of using oxygen as prescribed, and the risks of noncompliance. There is a brief discussion on traveling with oxygen and resources the patient may utilize.   Oxygen Equipment:  -Group instruction provided by [facility] Staff utilizing handouts, written materials, and equipment demonstrations.   PULMONARY REHAB OTHER RESPIRATORY from 09/20/2017 in [place]  Date  09/13/17  Educator  [facility]  Instruction Review Code  2- meets goals/outcomes      Signs and Symptoms:  -Group instruction provided by written material and verbal discussion to support subject matter. Warning signs and symptoms of infection, stroke, and heart  attack are reviewed and when to call the physician/911 reinforced. Tips for preventing the spread of infection discussed.   PULMONARY REHAB OTHER RESPIRATORY from 09/20/2017 in [place]  Date  07/05/17  Educator  RN  Instruction Review Code  2- meets goals/outcomes      Advanced Directives:  -Group instruction provided  by verbal instruction and written material to support subject matter. Instructor reviews Advanced Directive laws and proper instruction for filling out document.   Pulmonary Video:  -Group video education that reviews the importance of medication and oxygen compliance, exercise, good nutrition, pulmonary hygiene, and pursed lip and diaphragmatic breathing for the pulmonary patient.   PULMONARY REHAB OTHER RESPIRATORY from 09/20/2017 in [place]  Date  07/12/17  Instruction Review Code  2- meets goals/outcomes      Exercise for the Pulmonary Patient:  -Group instruction that is supported by a PowerPoint presentation. Instructor discusses benefits of exercise, core components of exercise, frequency, duration, and intensity of an exercise routine, importance of utilizing pulse oximetry during exercise, safety while exercising, and options of places to exercise outside of rehab.     PULMONARY REHAB OTHER RESPIRATORY from 09/20/2017 in [place]  Date  08/23/17  Educator  ep  Instruction Review Code  2- meets goals/outcomes      Pulmonary Medications:  -Verbally interactive group education provided by instructor with focus on inhaled medications and proper administration.   PULMONARY REHAB OTHER RESPIRATORY from 09/20/2017 in [place]  Date  08/16/17  Educator  pharmacist  Instruction Review Code  R- Review/reinforce      Anatomy and Physiology of the Respiratory System and Intimacy:  -Group instruction provided by PowerPoint, verbal  discussion, and written material to support subject matter. Instructor reviews respiratory cycle and anatomical components of the respiratory system and their functions. Instructor also reviews differences in obstructive and restrictive respiratory diseases with examples of each. Intimacy, Sex, and Sexuality differences are reviewed with a discussion on how relationships can change when diagnosed with pulmonary disease. Common sexual concerns are reviewed.   MD DAY -A group question and answer session with a medical doctor that allows participants to ask questions that relate to their pulmonary disease state.   PULMONARY REHAB OTHER RESPIRATORY from 09/20/2017 in [place]  Date  07/24/17  Educator  [email or web address]  Instruction Review Code  2- meets goals/outcomes      OTHER EDUCATION -Group or individual verbal, written, or video instructions that support the educational goals of the pulmonary rehab program.   Knowledge Questionnaire Score: Knowledge Questionnaire Score - 05/31/17 0901      Knowledge Questionnaire Score   Pre Score  10/13       Core Components/Risk Factors/Patient Goals at Admission: Personal Goals and Risk Factors at Admission - 05/28/17 1503      Core Components/Risk Factors/Patient Goals on Admission   Improve shortness of breath with ADL's  Yes    Intervention  Provide education, individualized exercise plan and daily activity instruction to help decrease symptoms of SOB with activities of daily living.    Expected Outcomes  Short Term: Achieves a reduction of symptoms when performing activities of daily living.    Develop more efficient breathing techniques such as purse lipped breathing and diaphragmatic breathing; and practicing self-pacing with activity  Yes    Intervention  Provide education, demonstration and support about specific breathing techniuqes utilized for more efficient breathing. Include techniques such as pursed lipped  breathing, diaphragmatic breathing and self-pacing activity.    Expected Outcomes  Short Term: Participant will be able to demonstrate and use breathing techniques as needed throughout daily activities.    Increase knowledge of respiratory medications and ability to use respiratory devices properly   Yes  Intervention  Provide education and demonstration as needed of appropriate use of medications, inhalers, and oxygen therapy.    Expected Outcomes  Short Term: Achieves understanding of medications use. Understands that oxygen is a medication prescribed by physician. Demonstrates appropriate use of inhaler and oxygen therapy.       Core Components/Risk Factors/Patient Goals Review:  Goals and Risk Factor Review    Row Name 06/13/17 1819 07/06/17 1222 08/05/17 2120 08/27/17 1528 09/18/17 1424     Core Components/Risk Factors/Patient Goals Review   Personal Goals Review  Improve shortness of breath with ADL's;Develop more efficient breathing techniques such as purse lipped breathing and diaphragmatic breathing and practicing self-pacing with activity.;Increase knowledge of respiratory medications and ability to use respiratory devices properly.  Improve shortness of breath with ADL's;Develop more efficient breathing techniques such as purse lipped breathing and diaphragmatic breathing and practicing self-pacing with activity.;Increase knowledge of respiratory medications and ability to use respiratory devices properly.  Improve shortness of breath with ADL's;Develop more efficient breathing techniques such as purse lipped breathing and diaphragmatic breathing and practicing self-pacing with activity.;Increase knowledge of respiratory medications and ability to use respiratory devices properly.  Improve shortness of breath with ADL's;Develop more efficient breathing techniques such as purse lipped breathing and diaphragmatic breathing and practicing self-pacing with activity.;Increase knowledge of  respiratory medications and ability to use respiratory devices properly.  Improve shortness of breath with ADL's;Develop more efficient breathing techniques such as purse lipped breathing and diaphragmatic breathing and practicing self-pacing with activity.;Increase knowledge of respiratory medications and ability to use respiratory devices properly.   Review  patient has only attended 2 sessions since admission. will evaluate progression towards goals over the next 30 days  patient is making slow progress towards goals related to the initial lack of oxygen orders for exercise. Now have order to maintain saturations 88 or greater with unlimited ammount of oxygen with exertion. He is using plb without cueing and paces himself while taking restbreaks with walking.  patient is now making greater progress in pulmonary rehab related to increase oxygen use parameters. He has perfected his PLB technique and is observed using it during all aspects of his exercise sessios.  patient states his shortness of breath with ADLs has improved. He is using PLB to decrease his shortness of breath during exertional activities. He has attended an education session on pulmonary medications taught by pharmacist and found it extremely informative.  patient states his shortness of breath with ADLs has improved. He is using PLB to decrease his shortness of breath during exertional activities. He has attended an education session on pulmonary medications taught by pharmacist and found it extremely informative. His exercise session have been extended to 36 to maximize his physical health.   Expected Outcomes  see admission outcomes  see admission outcomes  see admission outcomes  see admission outcomes  see admission outcomes   Row Name 10/11/17 0816             Core Components/Risk Factors/Patient Goals Review   Personal Goals Review  Improve shortness of breath with ADL's;Develop more efficient breathing techniques such as purse  lipped breathing and diaphragmatic breathing and practicing self-pacing with activity.;Increase knowledge of respiratory medications and ability to use respiratory devices properly.       Review  patient states his shortness of breath with ADLs has improved. He is using PLB to decrease his shortness of breath during exertional activities. He has attended an education session on pulmonary medications taught by pharmacist and  found it extremely informative. His exercise session have been extended to 36 to maximize his physical health.       Expected Outcomes  see admission outcomes          Core Components/Risk Factors/Patient Goals at Discharge (Final Review):  Goals and Risk Factor Review - 10/11/17 0816      Core Components/Risk Factors/Patient Goals Review   Personal Goals Review  Improve shortness of breath with ADL's;Develop more efficient breathing techniques such as purse lipped breathing and diaphragmatic breathing and practicing self-pacing with activity.;Increase knowledge of respiratory medications and ability to use respiratory devices properly.    Review  patient states his shortness of breath with ADLs has improved. He is using PLB to decrease his shortness of breath during exertional activities. He has attended an education session on pulmonary medications taught by pharmacist and found it extremely informative. His exercise session have been extended to 36 to maximize his physical health.    Expected Outcomes  see admission outcomes       ITP Comments:   Comments: Patient has attended 31 sessions since admission to pulmonary rehab

## 2017-10-18 ENCOUNTER — Encounter (HOSPITAL_COMMUNITY)
Admission: RE | Admit: 2017-10-18 | Discharge: 2017-10-18 | Disposition: A | Discharge status: Home / Self Care | Payer: PPO | Source: Ambulatory Visit | Attending: Emergency Medicine | Admitting: Emergency Medicine

## 2017-10-18 VITALS — Wt 185.2 lb

## 2017-10-18 DIAGNOSIS — N189 Chronic kidney disease, unspecified: Secondary | ICD-10-CM | POA: Insufficient documentation

## 2017-10-18 DIAGNOSIS — K219 Gastro-esophageal reflux disease without esophagitis: Secondary | ICD-10-CM | POA: Diagnosis not present

## 2017-10-18 DIAGNOSIS — J439 Emphysema, unspecified: Secondary | ICD-10-CM | POA: Diagnosis not present

## 2017-10-18 DIAGNOSIS — I129 Hypertensive chronic kidney disease with stage 1 through stage 4 chronic kidney disease, or unspecified chronic kidney disease: Secondary | ICD-10-CM | POA: Insufficient documentation

## 2017-10-18 DIAGNOSIS — Z79899 Other long term (current) drug therapy: Secondary | ICD-10-CM | POA: Insufficient documentation

## 2017-10-18 DIAGNOSIS — E785 Hyperlipidemia, unspecified: Secondary | ICD-10-CM | POA: Insufficient documentation

## 2017-10-18 DIAGNOSIS — M109 Gout, unspecified: Secondary | ICD-10-CM | POA: Diagnosis not present

## 2017-10-18 DIAGNOSIS — Z7951 Long term (current) use of inhaled steroids: Secondary | ICD-10-CM | POA: Diagnosis not present

## 2017-10-18 DIAGNOSIS — Z87891 Personal history of nicotine dependence: Secondary | ICD-10-CM | POA: Insufficient documentation

## 2017-10-18 DIAGNOSIS — Z7982 Long term (current) use of aspirin: Secondary | ICD-10-CM | POA: Diagnosis not present

## 2017-10-18 NOTE — Progress Notes (Signed)
Daily Session Note  Patient Details  Name: Charles Archer MRN: [id number] Date of Birth: 10-12-1936 Referring Provider:     Pulmonary Rehab Walk Test from 05/31/2017 in [place]  Referring Provider  Dr. Lamonte Sakai      Encounter Date: 10/18/2017  Check In: Session Check In - 10/18/17 1114      Check-In   Location  [facility]    Staff Present  Rodney Langton, RN;Portia Rollene Rotunda, RN, BSN;Amelianna Meller, MS, ACSM RCEP, Exercise Physiologist;Joan Leonia Reeves, RN, BSN    Supervising physician immediately available to respond to emergencies  [facility] immediately available    Physician(s)  Dr. Eliseo Squires    Medication changes reported      No    Fall or balance concerns reported     No    Tobacco Cessation  No Change    Warm-up and Cool-down  Performed as group-led instruction    Resistance Training Performed  Yes    VAD Patient?  No      Pain Assessment   Currently in Pain?  No/denies    Multiple Pain Sites  No       Capillary Blood Glucose: No results found for this or any previous visit (from the past 24 hour(s)).  Exercise Prescription Changes - 10/18/17 1300      Response to Exercise   Blood Pressure (Admit)  94/60    Blood Pressure (Exercise)  114/60    Blood Pressure (Exit)  94/64    Heart Rate (Admit)  115 bpm    Heart Rate (Exercise)  110 bpm    Heart Rate (Exit)  89 bpm    Oxygen Saturation (Admit)  92 %    Oxygen Saturation (Exercise)  91 %    Oxygen Saturation (Exit)  100 %    Rating of Perceived Exertion (Exercise)  13    Perceived Dyspnea (Exercise)  3    Duration  Progress to 45 minutes of aerobic exercise without signs/symptoms of physical distress    Intensity  THRR unchanged      Progression   Progression  Continue to progress workloads to maintain intensity without signs/symptoms of physical distress.      Resistance Training   Training Prescription  Yes    Weight  blue bands    Reps  10-15    Time  10  Minutes      Oxygen   Oxygen  Continuous    Liters  6-10      NuStep   Level  6    Minutes  17    METs  2.2      Arm Ergometer   Level  6    Minutes  17       Social History   Tobacco Use  Smoking Status Former Smoker  . Packs/day: 3.00  . Years: 30.00  . Pack years: 90.00  . Types: Cigarettes  . Last attempt to quit: 09/03/1989  . Years since quitting: 28.1  Smokeless Tobacco Never Used    Goals Met:  Exercise tolerated well No report of cardiac concerns or symptoms Strength training completed today  Goals Unmet:  Not Applicable  Comments: Service time is from 10:30a to 12:45p    Dr. Rush Farmer is Medical Director for Pulmonary Rehab at [facility].

## 2017-10-23 ENCOUNTER — Encounter (HOSPITAL_COMMUNITY)
Admission: RE | Admit: 2017-10-23 | Discharge: 2017-10-23 | Disposition: A | Discharge status: Home / Self Care | Payer: PPO | Source: Ambulatory Visit | Attending: Emergency Medicine | Admitting: Emergency Medicine

## 2017-10-23 VITALS — Wt 185.4 lb

## 2017-10-23 DIAGNOSIS — J439 Emphysema, unspecified: Secondary | ICD-10-CM | POA: Diagnosis not present

## 2017-10-23 DIAGNOSIS — J449 Chronic obstructive pulmonary disease, unspecified: Secondary | ICD-10-CM | POA: Diagnosis not present

## 2017-10-23 NOTE — Progress Notes (Signed)
Daily Session Note  Patient Details  Name: Charles Archer MRN: [id number] Date of Birth: Nov 14, 1935 Referring Provider:     Pulmonary Rehab Walk Test from 05/31/2017 in [place]  Referring Provider  Dr. Lamonte Sakai      Encounter Date: 10/23/2017  Check In: Session Check In - 10/23/17 1030      Check-In   Location  [facility]    Staff Present  Rosebud Poles, RN, Luisa Hart, RN, BSN;Tinley Rought, MS, ACSM RCEP, Exercise Physiologist;Lisa Ysidro Evert, RN    Supervising physician immediately available to respond to emergencies  [facility] immediately available    Physician(s)  Dr. Eliseo Squires    Medication changes reported      No    Fall or balance concerns reported     No    Tobacco Cessation  No Change    Warm-up and Cool-down  Performed as group-led instruction    Resistance Training Performed  Yes      Pain Assessment   Currently in Pain?  No/denies    Multiple Pain Sites  No       Capillary Blood Glucose: No results found for this or any previous visit (from the past 24 hour(s)).  Exercise Prescription Changes - 10/23/17 1200      Response to Exercise   Blood Pressure (Admit)  102/56    Blood Pressure (Exercise)  104/60    Blood Pressure (Exit)  100/60    Heart Rate (Admit)  100 bpm    Heart Rate (Exercise)  111 bpm    Heart Rate (Exit)  89 bpm    Oxygen Saturation (Admit)  96 %    Oxygen Saturation (Exercise)  90 %    Oxygen Saturation (Exit)  99 %    Rating of Perceived Exertion (Exercise)  13    Perceived Dyspnea (Exercise)  3    Duration  Progress to 45 minutes of aerobic exercise without signs/symptoms of physical distress    Intensity  THRR unchanged      Progression   Progression  Continue to progress workloads to maintain intensity without signs/symptoms of physical distress.      Resistance Training   Training Prescription  Yes    Weight  blue bands    Reps  10-15    Time  10 Minutes      Oxygen   Oxygen  Continuous    Liters  6-10      NuStep   Level  6    Minutes  17    METs  2      Arm Ergometer   Level  6    Minutes  17      Track   Laps  9    Minutes  17       Social History   Tobacco Use  Smoking Status Former Smoker  . Packs/day: 3.00  . Years: 30.00  . Pack years: 90.00  . Types: Cigarettes  . Last attempt to quit: 09/03/1989  . Years since quitting: 28.1  Smokeless Tobacco Never Used    Goals Met:  Exercise tolerated well No report of cardiac concerns or symptoms Strength training completed today  Goals Unmet:  Not Applicable  Comments: Service time is from 10:30a to 12:00p    Dr. Rush Farmer is Medical Director for Pulmonary Rehab at [facility].

## 2017-10-24 ENCOUNTER — Telehealth: Payer: Self-pay | Admitting: Emergency Medicine

## 2017-10-24 NOTE — Telephone Encounter (Signed)
[facility].  I do not see where we have placed an order for a poc.

## 2017-10-25 ENCOUNTER — Encounter (HOSPITAL_COMMUNITY)
Admission: RE | Admit: 2017-10-25 | Discharge: 2017-10-25 | Disposition: A | Discharge status: Home / Self Care | Payer: PPO | Source: Ambulatory Visit | Attending: Emergency Medicine | Admitting: Emergency Medicine

## 2017-10-25 VITALS — Wt 185.2 lb

## 2017-10-25 DIAGNOSIS — J439 Emphysema, unspecified: Secondary | ICD-10-CM | POA: Diagnosis not present

## 2017-10-25 NOTE — Telephone Encounter (Signed)
Spoke with [employer]. Advised that we haven't placed an order for O2 since 2015. Patient has an appt with pulm rehab and will attempt to get the room air sat from them.   Nothing else needed at time of call.

## 2017-10-25 NOTE — Progress Notes (Signed)
Daily Session Note  Patient Details  Name: Charles Archer MRN: [id number] Date of Birth: January 27, 1936 Referring Provider:     Pulmonary Rehab Walk Test from 05/31/2017 in [place]  Referring Provider  Dr. Lamonte Sakai      Encounter Date: 10/25/2017  Check In: Session Check In - 10/25/17 1030      Check-In   Location  [facility]    Staff Present  Rosebud Poles, RN, BSN;Molly diVincenzo, MS, ACSM RCEP, Exercise Physiologist;Lisa Ysidro Evert, RN;Kabrea Seeney Rollene Rotunda, RN, BSN    Supervising physician immediately available to respond to emergencies  [facility] immediately available    Physician(s)  Dr. Cruzita Lederer    Medication changes reported      No    Fall or balance concerns reported     No    Tobacco Cessation  No Change    Warm-up and Cool-down  Performed as group-led instruction    Resistance Training Performed  Yes    VAD Patient?  No      Pain Assessment   Currently in Pain?  No/denies    Multiple Pain Sites  No       Capillary Blood Glucose: No results found for this or any previous visit (from the past 24 hour(s)).  Exercise Prescription Changes - 10/25/17 1238      Response to Exercise   Blood Pressure (Admit)  110/60    Blood Pressure (Exercise)  126/56    Blood Pressure (Exit)  100/60    Heart Rate (Admit)  99 bpm    Heart Rate (Exercise)  124 bpm    Heart Rate (Exit)  99 bpm    Oxygen Saturation (Admit)  91 %    Oxygen Saturation (Exercise)  93 %    Oxygen Saturation (Exit)  98 %    Rating of Perceived Exertion (Exercise)  13    Perceived Dyspnea (Exercise)  3    Duration  Progress to 45 minutes of aerobic exercise without signs/symptoms of physical distress    Intensity  THRR unchanged      Progression   Progression  Continue to progress workloads to maintain intensity without signs/symptoms of physical distress.      Resistance Training   Training Prescription  Yes    Weight  blue bands    Reps  10-15    Time  10  Minutes      Oxygen   Oxygen  Continuous    Liters  6-10      NuStep   Level  6    Minutes  17    METs  2.2      Track   Laps  8    Minutes  17       Social History   Tobacco Use  Smoking Status Former Smoker  . Packs/day: 3.00  . Years: 30.00  . Pack years: 90.00  . Types: Cigarettes  . Last attempt to quit: 09/03/1989  . Years since quitting: 28.1  Smokeless Tobacco Never Used    Goals Met:  Independence with exercise equipment Improved SOB with ADL's Using PLB without cueing & demonstrates good technique Exercise tolerated well No report of cardiac concerns or symptoms Strength training completed today  Goals Unmet:  Not Applicable  Comments: Service time is from 1030 to 1230   Dr. Rush Farmer is Medical Director for Pulmonary Rehab at [facility].

## 2017-10-28 DIAGNOSIS — J449 Chronic obstructive pulmonary disease, unspecified: Secondary | ICD-10-CM | POA: Diagnosis not present

## 2017-10-30 ENCOUNTER — Encounter (HOSPITAL_COMMUNITY)
Admission: RE | Admit: 2017-10-30 | Discharge: 2017-10-30 | Disposition: A | Discharge status: Home / Self Care | Payer: PPO | Source: Ambulatory Visit | Attending: Emergency Medicine | Admitting: Emergency Medicine

## 2017-10-30 VITALS — Wt 185.2 lb

## 2017-10-30 DIAGNOSIS — J439 Emphysema, unspecified: Secondary | ICD-10-CM | POA: Diagnosis not present

## 2017-10-30 NOTE — Progress Notes (Signed)
Daily Session Note  Patient Details  Name: Charles Archer MRN: [id number] Date of Birth: Mar 15, 1936 Referring Provider:     Pulmonary Rehab Walk Test from 05/31/2017 in [place]  Referring Provider  Dr. Lamonte Sakai      Encounter Date: 10/30/2017  Check In: Session Check In - 10/30/17 1045      Check-In   Location  [facility]    Staff Present  Rosebud Poles, RN, Luisa Hart, RN, BSN;Molly diVincenzo, MS, ACSM RCEP, Exercise Physiologist;Lisa Ysidro Evert, RN    Supervising physician immediately available to respond to emergencies  [facility] immediately available    Physician(s)  Dr. Eliseo Squires    Medication changes reported      No    Fall or balance concerns reported     No    Tobacco Cessation  No Change    Warm-up and Cool-down  Performed as group-led instruction    Resistance Training Performed  Yes    VAD Patient?  No      Pain Assessment   Currently in Pain?  No/denies    Multiple Pain Sites  No       Capillary Blood Glucose: No results found for this or any previous visit (from the past 24 hour(s)).  Exercise Prescription Changes - 10/30/17 1200      Response to Exercise   Blood Pressure (Admit)  112/52    Blood Pressure (Exercise)  100/60    Blood Pressure (Exit)  90/50    Heart Rate (Admit)  98 bpm    Heart Rate (Exercise)  109 bpm    Heart Rate (Exit)  101 bpm    Oxygen Saturation (Admit)  92 %    Oxygen Saturation (Exercise)  95 %    Oxygen Saturation (Exit)  97 %    Rating of Perceived Exertion (Exercise)  13    Perceived Dyspnea (Exercise)  3    Duration  Progress to 45 minutes of aerobic exercise without signs/symptoms of physical distress    Intensity  THRR unchanged      Progression   Progression  Continue to progress workloads to maintain intensity without signs/symptoms of physical distress.      Resistance Training   Training Prescription  Yes    Weight  blue bands    Reps  10-15    Time  10  Minutes      Oxygen   Oxygen  Continuous    Liters  6-15      NuStep   Level  6    Minutes  17    METs  2.2      Arm Ergometer   Level  6    Minutes  17      Track   Laps  9    Minutes  17       Social History   Tobacco Use  Smoking Status Former Smoker  . Packs/day: 3.00  . Years: 30.00  . Pack years: 90.00  . Types: Cigarettes  . Last attempt to quit: 09/03/1989  . Years since quitting: 28.1  Smokeless Tobacco Never Used    Goals Met:  Exercise tolerated well Strength training completed today  Goals Unmet:  Not Applicable  Comments: Service time is from 1030 to 1200    Dr. Rush Farmer is Medical Director for Pulmonary Rehab at [facility].

## 2017-11-01 ENCOUNTER — Telehealth (HOSPITAL_COMMUNITY): Payer: Self-pay | Admitting: Family Medicine

## 2017-11-01 ENCOUNTER — Encounter (HOSPITAL_COMMUNITY): Payer: PPO

## 2017-11-06 ENCOUNTER — Encounter (HOSPITAL_COMMUNITY)
Admission: RE | Admit: 2017-11-06 | Discharge: 2017-11-06 | Disposition: A | Discharge status: Home / Self Care | Payer: PPO | Source: Ambulatory Visit | Attending: Family Medicine | Admitting: Family Medicine

## 2017-11-06 DIAGNOSIS — J439 Emphysema, unspecified: Secondary | ICD-10-CM

## 2017-11-06 NOTE — Progress Notes (Signed)
Pulmonary Individual Treatment Plan  Patient Details  Name: Charles Archer MRN: [id number] Date of Birth: [birth year] Referring Provider:     Pulmonary Rehab Walk Test from 05/31/2017 in [facility]  Referring Provider  Dr. Lamonte Sakai      Initial Encounter Date:    Pulmonary Rehab Walk Test from 05/31/2017 in [place]  Date  05/31/17  Referring Provider  Dr. Lamonte Sakai      Visit Diagnosis: Pulmonary emphysema, unspecified emphysema type ([place])  Patient's Home Medications on Admission:   Current Outpatient Medications:  .  acetaminophen (TYLENOL ARTHRITIS PAIN) 650 MG CR tablet, Take 650 mg by mouth every 8 (eight) hours as needed for pain., Disp: , Rfl:  .  albuterol (PROAIR HFA) 108 (90 Base) MCG/ACT inhaler, Inhale 2 puffs into the lungs every 6 (six) hours as needed for wheezing or shortness of breath., Disp: 1 Inhaler, Rfl: 5 .  allopurinol (ZYLOPRIM) 100 MG tablet, Take 100 mg by mouth daily., Disp: , Rfl:  .  aspirin 81 MG tablet, Take 81 mg by mouth daily., Disp: , Rfl:  .  B Complex-Folic Acid (SUPER B COMPLEX MAXI PO), Take 1 tablet by mouth daily. , Disp: , Rfl:  .  calcium carbonate (OS-CAL) 600 MG TABS tablet, Take 600 mg by mouth daily., Disp: , Rfl:  .  Cinnamon 500 MG capsule, Take 1,000 mg by mouth daily., Disp: , Rfl:  .  docusate sodium (COLACE) 50 MG capsule, Take 50 mg by mouth daily as needed for mild constipation. , Disp: , Rfl:  .  fluorouracil (EFUDEX) 5 % cream, Apply 5 % topically 2 (two) times daily as needed. , Disp: , Rfl:  .  fluticasone (FLONASE) 50 MCG/ACT nasal spray, PLACE 2 SPRAYS INTO BOTH NOSTRILS DAILY., Disp: 16 g, Rfl: 5 .  Ginkgo Biloba 40 MG TABS, Take 40 mg by mouth daily. , Disp: , Rfl:  .  ipratropium-albuterol (DUONEB) 0.5-2.5 (3) MG/3ML SOLN, Take 3 mLs by nebulization 4 (four) times daily., Disp: 360 mL, Rfl: 5 .  irbesartan (AVAPRO) 300 MG tablet, Take 1 tablet by mouth daily.,  Disp: , Rfl:  .  Misc Natural Products (OSTEO BI-FLEX ADV JOINT SHIELD PO), Take 1,500 mg by mouth daily., Disp: , Rfl:  .  Misc. Devices (ACAPELLA) MISC, Use as directed, Disp: 1 each, Rfl: 0 .  Multiple Vitamin (MULTIVITAMIN) capsule, Take 1 capsule by mouth daily., Disp: , Rfl:  .  Naproxen Sodium (ALEVE PO), Take 220 mg by mouth as needed (for pain). , Disp: , Rfl:  .  niacin 250 MG tablet, Take 250 mg by mouth at bedtime., Disp: , Rfl:  .  sildenafil (REVATIO) 20 MG tablet, Take 1 tablet by mouth daily., Disp: , Rfl:  .  tamsulosin (FLOMAX) 0.4 MG CAPS capsule, Take 1 capsule by mouth daily., Disp: , Rfl:  .  vitamin B-12 (CYANOCOBALAMIN) 1000 MCG tablet, Take 1,000 mcg by mouth daily., Disp: , Rfl:  .  zinc gluconate 50 MG tablet, Take 50 mg by mouth daily., Disp: , Rfl:   Past Medical History: Past Medical History:  Diagnosis Date  . Abnormal stress test    s/p cardiac cath 10/2013 - nonobstructive CAD with normal LV function  . CKD (chronic kidney disease)   . COPD (chronic obstructive pulmonary disease) ([place])   . Dyspnea on exertion   . Emphysema lung ([place])   . GERD (gastroesophageal reflux disease)   . Gout   .  HTN (hypertension)   . Hyperlipidemia     Tobacco Use: Social History   Tobacco Use  Smoking Status Former Smoker  . Packs/day: 3.00  . Years: 30.00  . Pack years: 90.00  . Types: Cigarettes  . Last attempt to quit: 09/03/1989  . Years since quitting: 28.1  Smokeless Tobacco Never Used    Labs: Recent Review Flowsheet Data    There is no flowsheet data to display.      Capillary Blood Glucose: No results found for: GLUCAP   Pulmonary Assessment Scores: Pulmonary Assessment Scores    Row Name 05/31/17 0901 05/31/17 1627       ADL UCSD   ADL Phase  Entry  Entry    SOB Score total  93  -      CAT Score   CAT Score  23 Entry  -      mMRC Score   mMRC Score  -  2       Pulmonary Function Assessment: Pulmonary Function Assessment -  05/28/17 1502      Breath   Bilateral Breath Sounds  Decreased    Shortness of Breath  Yes;Limiting activity       Exercise Target Goals:    Exercise Program Goal: Individual exercise prescription set using results from initial 6 min walk test and THRR while considering  patient's activity barriers and safety.    Exercise Prescription Goal: Initial exercise prescription builds to 30-45 minutes a day of aerobic activity, 2-3 days per week.  Home exercise guidelines will be given to patient during program as part of exercise prescription that the participant will acknowledge.  Activity Barriers & Risk Stratification: Activity Barriers & Cardiac Risk Stratification - 05/28/17 1447      Activity Barriers & Cardiac Risk Stratification   Activity Barriers  Deconditioning;Shortness of Breath       6 Minute Walk: 6 Minute Walk    Row Name 05/31/17 1622         6 Minute Walk   Phase  Initial     Distance  400 feet     Walk Time  - 2 minutes 55 seconds     # of Rest Breaks  - 2 (3 minutes 5 seconds total)     MPH  0.75     METS  1.09     RPE  11     Perceived Dyspnea   1     Symptoms  Yes (comment)     Comments  WHEELCHAIR     Resting HR  107 bpm     Resting BP  125/83     Max Ex. HR  118 bpm     Max Ex. BP  135/78       Interval HR   Baseline HR (retired)  107     1 Minute HR  116     2 Minute HR  116     3 Minute HR  111     4 Minute HR  118     5 Minute HR  116     6 Minute HR  113     2 Minute Post HR  103     Interval Heart Rate?  Yes       Interval Oxygen   Interval Oxygen?  Yes     Baseline Oxygen Saturation %  91 %     Resting Liters of Oxygen  4 L     1 Minute Oxygen Saturation %  81 %     1 Minute Liters of Oxygen  4 L     2 Minute Oxygen Saturation %  82 %     2 Minute Liters of Oxygen  4 L     3 Minute Oxygen Saturation %  88 %     3 Minute Liters of Oxygen  4 L     4 Minute Oxygen Saturation %  85 %     4 Minute Liters of Oxygen  4 L     5  Minute Oxygen Saturation %  81 %     5 Minute Liters of Oxygen  4 L     6 Minute Oxygen Saturation %  87 %     6 Minute Liters of Oxygen  4 L     2 Minute Post Oxygen Saturation %  91 %     2 Minute Post Liters of Oxygen  4 L        Oxygen Initial Assessment: Oxygen Initial Assessment - 05/31/17 1611      Initial 6 min Walk   Oxygen Used  Continuous;E-Tanks    Liters per minute  4    Resting Oxygen Saturation   91 %    Exercise Oxygen Saturation  during 6 min walk  81 %      Program Oxygen Prescription   Program Oxygen Prescription  Continuous    Liters per minute  -- unsure since emphysema will contact Dr. Lamonte Sakai for further guidance       Oxygen Re-Evaluation: Oxygen Re-Evaluation    Row Name 06/13/17 1816 07/06/17 1219 08/05/17 2118 08/27/17 1508 08/28/17 1559     Program Oxygen Prescription   Program Oxygen Prescription  Continuous  Continuous  Continuous  Continuous  -   Liters per minute  _0 -   Comments  he does desaturate on 4 liters and requires rest breaks while walking the track. will consult with MD regarding intolerance with ambulation on 4 liters  -  MD has approved increase liter flow to keep saturations >88%. continuing to work with MD office to secure patient appropriate oxygen equiptment for home exercise use  MD has approved increase liter flow to keep saturations >88%. continuing to work with MD office to secure patient appropriate oxygen equiptment for home exercise use  -     Home Oxygen   Home Oxygen Device  Home Concentrator;E-Tanks  Home Concentrator;E-Tanks  Home Concentrator;E-Tanks  -  -   Sleep Oxygen Prescription  Continuous  Continuous  Continuous  -  -   Liters per minute  _1 -  -   Home Exercise Oxygen Prescription  Pulsed patient only has pulsed regulator on portable tank  Pulsed this is not adequate for home exercise and will work with MD to make sure patient has sufficient oxygen to exercise.  -  -  Continuous   Liters per  minute  4  -  -  -  10   Home at Rest Exercise Oxygen Prescription  Continuous  Continuous  Continuous  -  -   Liters per minute  -  -  3  -  -   Compliance with Home Oxygen Use  Yes  Yes  Yes  -  -     Goals/Expected Outcomes   Short Term Goals  To learn and exhibit compliance with exercise, home and travel O2 prescription;To learn and understand importance of  monitoring SPO2 with pulse oximeter and demonstrate accurate use of the pulse oximeter.;To learn and understand importance of maintaining oxygen saturations>88%;To learn and demonstrate proper pursed lip breathing techniques or other breathing techniques.;To learn and demonstrate proper use of respiratory medications  To learn and exhibit compliance with exercise, home and travel O2 prescription;To learn and understand importance of monitoring SPO2 with pulse oximeter and demonstrate accurate use of the pulse oximeter.;To learn and understand importance of maintaining oxygen saturations>88%;To learn and demonstrate proper pursed lip breathing techniques or other breathing techniques.;To learn and demonstrate proper use of respiratory medications  To learn and exhibit compliance with exercise, home and travel O2 prescription;To learn and understand importance of monitoring SPO2 with pulse oximeter and demonstrate accurate use of the pulse oximeter.;To learn and understand importance of maintaining oxygen saturations>88%;To learn and demonstrate proper pursed lip breathing techniques or other breathing techniques.;To learn and demonstrate proper use of respiratory medications  -  To learn and exhibit compliance with exercise, home and travel O2 prescription;To learn and understand importance of monitoring SPO2 with pulse oximeter and demonstrate accurate use of the pulse oximeter.;To learn and understand importance of maintaining oxygen saturations>88%;To learn and demonstrate proper pursed lip breathing techniques or other breathing techniques.;To  learn and demonstrate proper use of respiratory medications   Long  Term Goals  Exhibits compliance with exercise, home and travel O2 prescription;Verbalizes importance of monitoring SPO2 with pulse oximeter and return demonstration;Maintenance of O2 saturations>88%;Compliance with respiratory medication;Exhibits proper breathing techniques, such as pursed lip breathing or other method taught during program session  -  Exhibits compliance with exercise, home and travel O2 prescription;Verbalizes importance of monitoring SPO2 with pulse oximeter and return demonstration;Maintenance of O2 saturations>88%;Compliance with respiratory medication;Exhibits proper breathing techniques, such as pursed lip breathing or other method taught during program session  -  Exhibits compliance with exercise, home and travel O2 prescription;Verbalizes importance of monitoring SPO2 with pulse oximeter and return demonstration;Maintenance of O2 saturations>88%;Compliance with respiratory medication;Exhibits proper breathing techniques, such as pursed lip breathing or other method taught during program session   Comments  -  patient is compliant with home o2 prescription  patient is compliant with home o2 prescription  -  patient now has appropriate home oxygen for exercise and compliant with oxygen prescription. He has been supplied with 15 e-cylinders   Goals/Expected Outcomes  -  see above goals  see above goals  -  see above goals   Row Name 09/18/17 1422 10/11/17 0815 11/05/17 1530         Program Oxygen Prescription   Program Oxygen Prescription  Continuous  Continuous  Continuous     Liters per minute  10  10  - 8-10     Comments  MD has approved increase liter flow to keep saturations >88%. continuing to work with MD office to secure patient appropriate oxygen equiptment for home exercise use  MD has approved increase liter flow to keep saturations >88%. continuing to work with MD office to secure patient appropriate  oxygen equiptment for home exercise use  MD has approved increase liter flow to keep saturations >88%. continuing to work with MD office to secure patient appropriate oxygen equiptment for home exercise use       Home Oxygen   Home Oxygen Device  Home Concentrator;E-Tanks  Home Concentrator;E-Tanks  Home Concentrator;E-Tanks     Sleep Oxygen Prescription  Continuous  Continuous  Continuous     Liters per minute  3  3  - 4-8  Home Exercise Oxygen Prescription  Continuous  Continuous  Continuous     Liters per minute  _0 Home at Rest Exercise Oxygen Prescription  Continuous  Continuous  Continuous     Liters per minute  _1 Compliance with Home Oxygen Use  Yes  Yes  No       Goals/Expected Outcomes   Short Term Goals  To learn and exhibit compliance with exercise, home and travel O2 prescription;To learn and understand importance of monitoring SPO2 with pulse oximeter and demonstrate accurate use of the pulse oximeter.;To learn and understand importance of maintaining oxygen saturations>88%;To learn and demonstrate proper pursed lip breathing techniques or other breathing techniques.;To learn and demonstrate proper use of respiratory medications  To learn and exhibit compliance with exercise, home and travel O2 prescription;To learn and understand importance of monitoring SPO2 with pulse oximeter and demonstrate accurate use of the pulse oximeter.;To learn and understand importance of maintaining oxygen saturations>88%;To learn and demonstrate proper pursed lip breathing techniques or other breathing techniques.;To learn and demonstrate proper use of respiratory medications  To learn and exhibit compliance with exercise, home and travel O2 prescription;To learn and understand importance of monitoring SPO2 with pulse oximeter and demonstrate accurate use of the pulse oximeter.;To learn and understand importance of maintaining oxygen saturations>88%;To learn and demonstrate proper  pursed lip breathing techniques or other breathing techniques.;To learn and demonstrate proper use of respiratory medications     Long  Term Goals  Exhibits compliance with exercise, home and travel O2 prescription;Verbalizes importance of monitoring SPO2 with pulse oximeter and return demonstration;Maintenance of O2 saturations>88%;Compliance with respiratory medication;Exhibits proper breathing techniques, such as pursed lip breathing or other method taught during program session  -  Exhibits compliance with exercise, home and travel O2 prescription;Verbalizes importance of monitoring SPO2 with pulse oximeter and return demonstration;Maintenance of O2 saturations>88%;Compliance with respiratory medication;Exhibits proper breathing techniques, such as pursed lip breathing or other method taught during program session     Comments  patient now has appropriate home oxygen for exercise and compliant with oxygen prescription. He has been supplied with 15 e-cylinders  patient now has appropriate home oxygen for exercise and compliant with oxygen prescription. He has been supplied with 15 e-cylinders  patient now has appropriate home oxygen for exercise and compliant with oxygen prescription. He has been supplied with 15 e-cylinders     Goals/Expected Outcomes  see above goals  see above goals  see above goals        Oxygen Discharge (Final Oxygen Re-Evaluation): Oxygen Re-Evaluation - 11/05/17 1530      Program Oxygen Prescription   Program Oxygen Prescription  Continuous    Liters per minute  -- 8-10    Comments  MD has approved increase liter flow to keep saturations >88%. continuing to work with MD office to secure patient appropriate oxygen equiptment for home exercise use      Home Oxygen   Home Oxygen Device  Home Concentrator;E-Tanks    Sleep Oxygen Prescription  Continuous    Liters per minute  -- 4-8    Home Exercise Oxygen Prescription  Continuous    Liters per minute  10    Home at  Rest Exercise Oxygen Prescription  Continuous    Liters per minute  3    Compliance with Home Oxygen Use  No      Goals/Expected Outcomes   Short Term Goals  To learn and exhibit compliance with exercise, home and travel O2 prescription;To learn and understand importance of monitoring SPO2 with pulse oximeter and demonstrate accurate use of the pulse oximeter.;To learn and understand importance of maintaining oxygen saturations>88%;To learn and demonstrate proper pursed lip breathing techniques or other breathing techniques.;To learn and demonstrate proper use of respiratory medications    Long  Term Goals  Exhibits compliance with exercise, home and travel O2 prescription;Verbalizes importance of monitoring SPO2 with pulse oximeter and return demonstration;Maintenance of O2 saturations>88%;Compliance with respiratory medication;Exhibits proper breathing techniques, such as pursed lip breathing or other method taught during program session    Comments  patient now has appropriate home oxygen for exercise and compliant with oxygen prescription. He has been supplied with 15 e-cylinders    Goals/Expected Outcomes  see above goals       Initial Exercise Prescription: Initial Exercise Prescription - 05/31/17 1600      Date of Initial Exercise RX and Referring Provider   Date  05/31/17    Referring Provider  Dr. Lamonte Sakai      Oxygen   Oxygen  Continuous    Liters  4 4      NuStep   Level  1    Minutes  34    METs  1.2      Arm Ergometer   Level  1    Minutes  17      Prescription Details   Frequency (times per week)  2    Duration  Progress to 45 minutes of aerobic exercise without signs/symptoms of physical distress      Intensity   THRR 40-80% of Max Heartrate  56-113    Ratings of Perceived Exertion  11-13    Perceived Dyspnea  0-4      Resistance Training   Training Prescription  Yes    Weight  blue bands    Reps  10-15       Perform Capillary Blood Glucose checks as  needed.  Exercise Prescription Changes: Exercise Prescription Changes    Row Name 06/07/17 1200 06/12/17 1200 06/14/17 1256 06/19/17 1224 06/21/17 1200     Response to Exercise   Blood Pressure (Admit)  142/70  112/60  110/62  104/68  112/60   Blood Pressure (Exercise)  126/62  138/62  140/80  110/60  116/50   Blood Pressure (Exit)  102/62  106/60  114/62  112/70  96/60   Heart Rate (Admit)  102 bpm  95 bpm  83 bpm  109 bpm  98 bpm   Heart Rate (Exercise)  107 bpm  100 bpm  106 bpm  119 bpm  102 bpm   Heart Rate (Exit)  84 bpm  80 bpm  85 bpm  104 bpm  83 bpm   Oxygen Saturation (Admit)  98 %  95 %  97 %  91 %  95 %   Oxygen Saturation (Exercise)  89 %  93 %  79 %  89 %  90 %   Oxygen Saturation (Exit)  97 %  98 %  98 %  99 %  100 %   Rating of Perceived Exertion (Exercise)  _0 Perceived Dyspnea (Exercise)  _1 Duration  Progress to 45 minutes of aerobic exercise without signs/symptoms of physical distress  Progress to 45 minutes of aerobic exercise without signs/symptoms of physical distress  Progress  to 45 minutes of aerobic exercise without signs/symptoms of physical distress  Progress to 45 minutes of aerobic exercise without signs/symptoms of physical distress  Progress to 45 minutes of aerobic exercise without signs/symptoms of physical distress   Intensity  Other (comment) 40-80% of HRR  Other (comment) 40-80% of HRR  Other (comment) 40-80% of HRR  Other (comment) 40-80% of HRR  THRR unchanged     Progression   Progression  Continue to progress workloads to maintain intensity without signs/symptoms of physical distress.  Continue to progress workloads to maintain intensity without signs/symptoms of physical distress.  Continue to progress workloads to maintain intensity without signs/symptoms of physical distress.  Continue to progress workloads to maintain intensity without signs/symptoms of physical distress.  Continue to progress workloads to maintain  intensity without signs/symptoms of physical distress.     Resistance Training   Training Prescription  Yes  Yes  Yes  Yes  Yes   Weight  blue bands  blue bands  blue bands  blue bands  blue bands   Reps  10-15  10-15  10-15  10-15  10-15   Time  10 Minutes  10 Minutes  10 Minutes  10 Minutes  10 Minutes     Oxygen   Oxygen  Continuous  Continuous  Continuous  Continuous  Continuous   Liters  _0 4-6     NuStep   Level  _1 Minutes  17  34  _2 METs  2.1  2  2.1  2.4  2.7     Arm Ergometer   Level  1  2  -  2  3   Minutes  17  17  -  17  17     Track   Laps  -  -  5  8  -   Minutes  -  -  17  17  -   Row Name 06/26/17 1200 06/28/17 1200 07/03/17 1632 07/05/17 1200 07/10/17 1200     Response to Exercise   Blood Pressure (Admit)  110/60  120/64  120/62  120/64  119/64   Blood Pressure (Exercise)  122/64  128/70  118/80  120/80  104/62   Blood Pressure (Exit)  102/60  102/62  100/60  106/64  90/50   Heart Rate (Admit)  101 bpm  108 bpm  103 bpm  88 bpm  98 bpm   Heart Rate (Exercise)  106 bpm  118 bpm  116 bpm  110 bpm  116 bpm   Heart Rate (Exit)  85 bpm  85 bpm  95 bpm  85 bpm  100 bpm   Oxygen Saturation (Admit)  93 %  90 %  95 %  97 %  94 %   Oxygen Saturation (Exercise)  83 %  83 % 4 liters increased to 8 liters  85 % desaturated on 10L  87 % desaturated on 10L  88 %   Oxygen Saturation (Exit)  98 %  100 %  85 %  97 %  99 %   Rating of Perceived Exertion (Exercise)  _3 Perceived Dyspnea (Exercise)  _4 Duration  Progress to 45 minutes of aerobic exercise without signs/symptoms of physical distress  Progress to 45 minutes of  aerobic exercise without signs/symptoms of physical distress  Progress to 45 minutes of aerobic exercise without signs/symptoms of physical distress  Progress to 45 minutes of aerobic exercise without signs/symptoms of physical distress  Progress to 45 minutes of aerobic exercise without  signs/symptoms of physical distress   Intensity  THRR unchanged  THRR unchanged  THRR unchanged  THRR unchanged  THRR unchanged     Progression   Progression  Continue to progress workloads to maintain intensity without signs/symptoms of physical distress.  Continue to progress workloads to maintain intensity without signs/symptoms of physical distress.  Continue to progress workloads to maintain intensity without signs/symptoms of physical distress.  Continue to progress workloads to maintain intensity without signs/symptoms of physical distress.  Continue to progress workloads to maintain intensity without signs/symptoms of physical distress.     Resistance Training   Training Prescription  Yes  Yes  Yes  Yes  Yes   Weight  blue bands  blue bands  blue bands  blue bands  blue bands   Reps  10-15  10-15  10-15  10-15  10-15   Time  10 Minutes  10 Minutes  10 Minutes  10 Minutes  10 Minutes     Oxygen   Oxygen  Continuous  Continuous  Continuous  Continuous  Continuous   Liters  4-6  4-_0 NuStep   Level  _1 Minutes  _2 METs  -  2.4  2.8  2.6  2.2     Arm Ergometer   Level  -  3  3  -  4   Minutes  -  17  17  -  17     Track   Laps  _3 Minutes  _4 Row Name 07/12/17 1222 07/19/17 1300 07/24/17 1200 07/31/17 1200 08/02/17 1200     Response to Exercise   Blood Pressure (Admit)  118/56  106/58  130/74  120/68  142/70   Blood Pressure (Exercise)  104/58  118/62  150/82  128/80  144/70   Blood Pressure (Exit)  104/60  96/64  104/70  108/60  110/72   Heart Rate (Admit)  92 bpm  92 bpm  104 bpm  93 bpm  97 bpm   Heart Rate (Exercise)  107 bpm  109 bpm  1256 bpm  114 bpm  117 bpm   Heart Rate (Exit)  85 bpm  69 bpm  102 bpm  93 bpm  95 bpm   Oxygen Saturation (Admit)  97 %  94 %  94 %  99 %  92 %   Oxygen Saturation (Exercise)  85 %  87 %  89 %  85 %  81 % 10 liters 89% 15 liters   Oxygen Saturation (Exit)  99 %   93 %  97 %  87 %  98 %   Rating of Perceived Exertion (Exercise)  _5 Perceived Dyspnea (Exercise)  _6 Duration  Progress to 45 minutes of aerobic exercise without signs/symptoms of physical distress  Progress to 45 minutes of aerobic exercise without signs/symptoms of physical distress  Progress  to 45 minutes of aerobic exercise without signs/symptoms of physical distress  Progress to 45 minutes of aerobic exercise without signs/symptoms of physical distress  Progress to 45 minutes of aerobic exercise without signs/symptoms of physical distress   Intensity  THRR unchanged  THRR unchanged  THRR unchanged  THRR unchanged  THRR unchanged     Progression   Progression  Continue to progress workloads to maintain intensity without signs/symptoms of physical distress.  Continue to progress workloads to maintain intensity without signs/symptoms of physical distress.  Continue to progress workloads to maintain intensity without signs/symptoms of physical distress.  Continue to progress workloads to maintain intensity without signs/symptoms of physical distress.  Continue to progress workloads to maintain intensity without signs/symptoms of physical distress.     Resistance Training   Training Prescription  Yes  Yes  Yes  Yes  Yes   Weight  blue bands  blue bands  blue bands  blue bands  blue bands   Reps  10-15  10-15  10-15  10-15  10-15   Time  10 Minutes  10 Minutes  10 Minutes  10 Minutes  10 Minutes     Oxygen   Oxygen  Continuous  Continuous  -  Continuous  Continuous   Liters  10  10  -  10  10     NuStep   Level  _0 -   Minutes  _1 -   METs  3.1  2.7  2.5  2.8  -     Arm Ergometer   Level  _2 Minutes  _3 Track   Laps  -  -  -  7  8   Minutes  -  -  -  17  17   Row Name 08/07/17 1200 08/09/17 1200 08/14/17 1210 08/16/17 1200 08/21/17 1200     Response to Exercise   Blood Pressure (Admit)  118/62   100/50  104/62  112/60  112/70   Blood Pressure (Exercise)  110/64  130/70  124/70  126/64  130/60   Blood Pressure (Exit)  98/62  110/70  104/66  108/82  108/64   Heart Rate (Admit)  91 bpm  91 bpm  87 bpm  91 bpm  110 bpm   Heart Rate (Exercise)  112 bpm  117 bpm  108 bpm  111 bpm  126 bpm   Heart Rate (Exit)  106 bpm  80 bpm  94 bpm  89 bpm  117 bpm   Oxygen Saturation (Admit)  98 %  97 %  96 %  95 %  94 %   Oxygen Saturation (Exercise)  88 % 10 liters 89% 15 liters  92 %  86 %  90 %  89 %   Oxygen Saturation (Exit)  97 %  97 %  98 %  98 %  95 %   Rating of Perceived Exertion (Exercise)  _4 Perceived Dyspnea (Exercise)  _5 Duration  Progress to 45 minutes of aerobic exercise without signs/symptoms of physical distress  Progress to 45 minutes of aerobic exercise without signs/symptoms of physical distress  Progress to 45 minutes of aerobic exercise without signs/symptoms of physical  distress  Progress to 45 minutes of aerobic exercise without signs/symptoms of physical distress  Progress to 45 minutes of aerobic exercise without signs/symptoms of physical distress   Intensity  THRR unchanged  THRR unchanged  THRR unchanged  THRR unchanged  THRR unchanged     Progression   Progression  Continue to progress workloads to maintain intensity without signs/symptoms of physical distress.  Continue to progress workloads to maintain intensity without signs/symptoms of physical distress.  Continue to progress workloads to maintain intensity without signs/symptoms of physical distress.  Continue to progress workloads to maintain intensity without signs/symptoms of physical distress.  Continue to progress workloads to maintain intensity without signs/symptoms of physical distress.     Resistance Training   Training Prescription  Yes  Yes  Yes  Yes  Yes   Weight  blue bands  blue bands  blue bands  blue bands  blue bands   Reps  10-15  10-15  10-15  10-15  10-15   Time  10  Minutes  10 Minutes  10 Minutes  10 Minutes  10 Minutes     Oxygen   Oxygen  Continuous  Continuous  Continuous  Continuous  Continuous   Liters  _0 NuStep   Level  _1 -  6   Minutes  _2 -  17   METs  2.7  2.5  2.8  -  2.5     Arm Ergometer   Level  6  -  _3 Minutes  17  -  _4 Track   Laps  _5 Minutes  _6 Row Name 08/23/17 1300 08/28/17 1200 08/30/17 1200 09/04/17 1200 09/11/17 1229     Response to Exercise   Blood Pressure (Admit)  128/70  104/60  110/70  110/60  108/64   Blood Pressure (Exercise)  122/70  110/62  130/70  124/70  110/60   Blood Pressure (Exit)  108/60  102/74  116/64  104/60  141/79   Heart Rate (Admit)  91 bpm  102 bpm  90 bpm  101 bpm  98 bpm   Heart Rate (Exercise)  110 bpm  126 bpm  106 bpm  115 bpm  114 bpm   Heart Rate (Exit)  96 bpm  118 bpm  90 bpm  95 bpm  103 bpm   Oxygen Saturation (Admit)  91 %  96 %  90 %  96 %  93 %   Oxygen Saturation (Exercise)  87 %  87 %  89 %  81 % O2 increased to15L sat improved to  92%  89 % O2 increased to15L sat improved to  92%   Oxygen Saturation (Exit)  97 %  94 %  98 %  96 %  97 %   Rating of Perceived Exertion (Exercise)  _7 Perceived Dyspnea (Exercise)  _8 Duration  Progress to 45 minutes of aerobic exercise without signs/symptoms of physical distress  Progress to 45 minutes of aerobic exercise without signs/symptoms of physical distress  Progress to 45 minutes of aerobic exercise without signs/symptoms of  physical distress  Progress to 45 minutes of aerobic exercise without signs/symptoms of physical distress  Progress to 45 minutes of aerobic exercise without signs/symptoms of physical distress   Intensity  THRR unchanged  THRR unchanged  THRR unchanged  THRR unchanged  THRR unchanged     Progression   Progression  Continue to progress workloads to maintain intensity without signs/symptoms of physical  distress.  Continue to progress workloads to maintain intensity without signs/symptoms of physical distress.  Continue to progress workloads to maintain intensity without signs/symptoms of physical distress.  Continue to progress workloads to maintain intensity without signs/symptoms of physical distress.  Continue to progress workloads to maintain intensity without signs/symptoms of physical distress.     Resistance Training   Training Prescription  Yes  Yes  Yes  Yes  Yes   Weight  blue bands  blue bands  blue bands  blue bands  blue bands   Reps  10-15  10-15  10-15  10-15  10-15   Time  10 Minutes  10 Minutes  10 Minutes  10 Minutes  10 Minutes     Oxygen   Oxygen  Continuous  Continuous  Continuous  Continuous  Continuous   Liters  _0 6-15  6-10     NuStep   Level  _1 Minutes  _2 METs  2.8  2.5  2.6  2.5  2.5     Arm Ergometer   Level  -  _3 Minutes  -  _4 Track   Laps  7  9  -  9.5  7   Minutes  17  17  -  17  17     Home Exercise Plan   Plans to continue exercise at  -  Home (comment)  -  -  -   Frequency  -  Add 2 additional days to program exercise sessions.  -  -  -   Row Name 09/13/17 1341 09/18/17 1200 09/20/17 1200 09/27/17 1200 10/02/17 1400     Response to Exercise   Blood Pressure (Admit)  122/60  108/70  110/54  100/60  110/62   Blood Pressure (Exercise)  120/70  100/60  104/60  112/54  100/66   Blood Pressure (Exit)  100/50  100/50  94/50  100/62  106/60   Heart Rate (Admit)  89 bpm  90 bpm  108 bpm  117 bpm  109 bpm   Heart Rate (Exercise)  110 bpm  118 bpm  108 bpm  114 bpm  118 bpm   Heart Rate (Exit)  86 bpm  101 bpm  90 bpm  103 bpm  106 bpm   Oxygen Saturation (Admit)  93 %  93 %  93 %  95 %  98 %   Oxygen Saturation (Exercise)  89 % O2 increased to15L sat improved to  92%  89 %  92 %  86 %  90 %   Oxygen Saturation (Exit)  97 %  97 %  96 %  97 %  96 %   Rating of Perceived Exertion  (Exercise)  _5 Perceived Dyspnea (Exercise)  _6 3  Duration  Progress to 45 minutes of aerobic exercise without signs/symptoms of physical distress  Progress to 45 minutes of aerobic exercise without signs/symptoms of physical distress  Progress to 45 minutes of aerobic exercise without signs/symptoms of physical distress  Progress to 45 minutes of aerobic exercise without signs/symptoms of physical distress  Progress to 45 minutes of aerobic exercise without signs/symptoms of physical distress   Intensity  THRR unchanged  THRR unchanged  THRR unchanged  THRR unchanged  THRR unchanged     Progression   Progression  Continue to progress workloads to maintain intensity without signs/symptoms of physical distress.  Continue to progress workloads to maintain intensity without signs/symptoms of physical distress.  Continue to progress workloads to maintain intensity without signs/symptoms of physical distress.  Continue to progress workloads to maintain intensity without signs/symptoms of physical distress.  Continue to progress workloads to maintain intensity without signs/symptoms of physical distress.     Resistance Training   Training Prescription  Yes  Yes  Yes  Yes  Yes   Weight  blue bands  blue bands  blue bands  blue bands  blue bands   Reps  10-15  10-15  10-15  10-15  10-15   Time  10 Minutes  10 Minutes  10 Minutes  10 Minutes  10 Minutes     Oxygen   Oxygen  Continuous  Continuous  Continuous  Continuous  Continuous   Liters  6-10  6-10  6-10  6-10  6-10     NuStep   Level  -  6  -  6  6   Minutes  -  17  -  17  17   METs  -  2.6  -  2.7  2.2     Arm Ergometer   Level  _0 Minutes  _1 Track   Laps  _2 Minutes  _3 Row Name 10/04/17 1300 10/11/17 1200 10/18/17 1300 10/23/17 1200 10/25/17 1238     Response to Exercise   Blood Pressure (Admit)  116/60  100/62  94/60  102/56  110/60    Blood Pressure (Exercise)  130/70  106/60  114/60  104/60  126/56   Blood Pressure (Exit)  92/56  92/60  94/64  100/60  100/60   Heart Rate (Admit)  106 bpm  109 bpm  115 bpm  100 bpm  99 bpm   Heart Rate (Exercise)  105 bpm  121 bpm  110 bpm  111 bpm  124 bpm   Heart Rate (Exit)  84 bpm  99 bpm  89 bpm  89 bpm  99 bpm   Oxygen Saturation (Admit)  98 %  97 %  92 %  96 %  91 %   Oxygen Saturation (Exercise)  92 %  88 %  91 %  90 %  93 %   Oxygen Saturation (Exit)  100 %  98 %  100 %  99 %  98 %   Rating of Perceived Exertion (Exercise)  _4 Perceived Dyspnea (Exercise)  _5 Duration  Progress to 45 minutes of aerobic exercise without signs/symptoms of physical distress  Progress to 45 minutes of aerobic exercise without signs/symptoms of physical distress  Progress to 45 minutes of aerobic exercise without signs/symptoms of physical distress  Progress to 45 minutes of aerobic exercise without signs/symptoms of physical distress  Progress to 45 minutes of aerobic exercise without signs/symptoms of physical distress   Intensity  THRR unchanged  THRR unchanged  THRR unchanged  THRR unchanged  THRR unchanged     Progression   Progression  Continue to progress workloads to maintain intensity without signs/symptoms of physical distress.  Continue to progress workloads to maintain intensity without signs/symptoms of physical distress.  Continue to progress workloads to maintain intensity without signs/symptoms of physical distress.  Continue to progress workloads to maintain intensity without signs/symptoms of physical distress.  Continue to progress workloads to maintain intensity without signs/symptoms of physical distress.     Resistance Training   Training Prescription  Yes  Yes  Yes  Yes  Yes   Weight  blue bands  blue bands  blue bands  blue bands  blue bands   Reps  10-15  10-15  10-15  10-15  10-15   Time  10 Minutes  10 Minutes  10 Minutes  10 Minutes  10 Minutes      Oxygen   Oxygen  Continuous  Continuous  Continuous  Continuous  Continuous   Liters  6-10  6-10  6-10  6-10  6-10     NuStep   Level  _0 Minutes  _1 METs  2.1  2.1  2.2  2  2.2     Arm Ergometer   Level  _2 -   Minutes  _3 -     Track   Laps  -  8  -  9  8   Minutes  -  17  -  17  19   Row Name 10/30/17 1200             Response to Exercise   Blood Pressure (Admit)  112/52       Blood Pressure (Exercise)  100/60       Blood Pressure (Exit)  90/50       Heart Rate (Admit)  98 bpm       Heart Rate (Exercise)  109 bpm       Heart Rate (Exit)  101 bpm       Oxygen Saturation (Admit)  92 %       Oxygen Saturation (Exercise)  95 %       Oxygen Saturation (Exit)  97 %       Rating of Perceived Exertion (Exercise)  13       Perceived Dyspnea (Exercise)  3       Duration  Progress to 45 minutes of aerobic exercise without signs/symptoms of physical distress       Intensity  THRR unchanged         Progression   Progression  Continue to progress workloads to maintain intensity without signs/symptoms of physical distress.         Resistance Training   Training Prescription  Yes       Weight  blue bands       Reps  10-15       Time  10 Minutes  Oxygen   Oxygen  Continuous       Liters  6-15         NuStep   Level  6       Minutes  17       METs  2.2         Arm Ergometer   Level  6       Minutes  17         Track   Laps  9       Minutes  17          Exercise Comments: Exercise Comments    Row Name 08/30/17 0739           Exercise Comments  Home exercise completed          Exercise Goals and Review: Exercise Goals    [place] Name 05/28/17 1447             Exercise Goals   Increase Physical Activity  Yes       Intervention  Provide advice, education, support and counseling about physical activity/exercise needs.;Develop an individualized exercise prescription for aerobic and resistive  training based on initial evaluation findings, risk stratification, comorbidities and participant's personal goals.       Expected Outcomes  Achievement of increased cardiorespiratory fitness and enhanced flexibility, muscular endurance and strength shown through measurements of functional capacity and personal statement of participant.       Increase Strength and Stamina  Yes       Intervention  Provide advice, education, support and counseling about physical activity/exercise needs.;Develop an individualized exercise prescription for aerobic and resistive training based on initial evaluation findings, risk stratification, comorbidities and participant's personal goals.       Expected Outcomes  Achievement of increased cardiorespiratory fitness and enhanced flexibility, muscular endurance and strength shown through measurements of functional capacity and personal statement of participant.          Exercise Goals Re-Evaluation : Exercise Goals Re-Evaluation    Row Name 06/12/17 0830 07/02/17 1152 08/06/17 1005 08/30/17 1604 09/18/17 0758     Exercise Goal Re-Evaluation   Exercise Goals Review  Increase Physical Activity;Increase Strength and Stamina;Able to understand and use Dyspnea scale;Able to understand and use rate of perceived exertion (RPE) scale;Knowledge and understanding of Target Heart Rate Range (THRR);Understanding of Exercise Prescription  Increase Strength and Stamina;Increase Physical Activity;Able to understand and use Dyspnea scale;Able to understand and use rate of perceived exertion (RPE) scale;Knowledge and understanding of Target Heart Rate Range (THRR);Understanding of Exercise Prescription  Increase Physical Activity;Increase Strength and Stamina;Able to understand and use Dyspnea scale;Able to understand and use rate of perceived exertion (RPE) scale;Knowledge and understanding of Target Heart Rate Range (THRR);Understanding of Exercise Prescription  Increase Strength and  Stamina;Increase Physical Activity;Able to understand and use rate of perceived exertion (RPE) scale;Able to understand and use Dyspnea scale;Knowledge and understanding of Target Heart Rate Range (THRR);Understanding of Exercise Prescription  Increase Strength and Stamina;Increase Physical Activity;Able to understand and use rate of perceived exertion (RPE) scale;Able to understand and use Dyspnea scale;Knowledge and understanding of Target Heart Rate Range (THRR);Understanding of Exercise Prescription   Comments  Patient has only attended one exercise session. Will cont to progress as able.   Patient is progressing slow and steady in program. Patient has been desaturating on 8 liters. Permission has been granted from Dr. Lamonte Sakai to increase liter flow to titrate above 88%. Will cont to monitor and progress as able.  Patient is progressing slow and steady in program. When he tries to push himself his oxygen saturation decreases to unsafe levels even on 10 liters. We have titrated him to 15 liters. Oxymizer pendant has been ordered. Motivated to work hard and make changes although he is limited by his shortness of breath and deconditioning.   Patient is progressing slow and steady in the program. He works hard while he is here. Has adequate oxygen at home for exercise. Has started his home exercise routine. Will cont. to monitor and progress.   Patient is progressing slow and steady in the program. He works hard while he is here. Has adequate oxygen at home for exercise. Has started his home exercise routine. Has been extended out to 36 sessions in Pulmonary Rehab. When we get closer to graduation, I will encourage patient to come to maintenance. Will cont. to monitor and progress.    Expected Outcomes  Through exercise and education at rehab and at home, patient will increase strength and stamina. Patient will also gain a better understanding of the need for physical activity on a daily basis and the effects it  can have on quality of life.  Through exercise and education at rehab and at home, patient will increase strength and stamina. Patient will also gain a better understanding of the need for physical activity on a daily basis and the effects it can have on quality of life.  Through exercise and education at rehab and at home, patient will increase strength and stamina. Patient will also gain a better understanding of the need for physical activity on a daily basis and the effects it can have on quality of life.  Through exercise at rehab and at home, patient will increase strength and stamina making ADL's easier to perform. Patient will also have a better understanding of safe exercise and what they are capable to do outside of clinical supervision.  Through exercise at rehab and at home, patient will increase strength and stamina making ADL's easier to perform. Patient will also have a better understanding of safe exercise and what they are capable to do outside of clinical supervision.   [place] Name 10/04/17 4196 11/05/17 1532           Exercise Goal Re-Evaluation   Exercise Goals Review  Increase Strength and Stamina;Increase Physical Activity;Able to understand and use Dyspnea scale;Able to understand and use rate of perceived exertion (RPE) scale;Knowledge and understanding of Target Heart Rate Range (THRR);Understanding of Exercise Prescription  Increase Strength and Stamina;Increase Physical Activity;Able to understand and use Dyspnea scale;Able to understand and use rate of perceived exertion (RPE) scale;Knowledge and understanding of Target Heart Rate Range (THRR);Understanding of Exercise Prescription      Comments  Patient is progressing slow and steady in the program. He works hard while he is here. Has adequate oxygen at home for exercise. Has started his home exercise routine. Has been extended out to 36 sessions in Pulmonary Rehab. When we get closer to graduation, I will encourage patient to come  to maintenance. Will cont. to monitor and progress.   Patient is progressing slow and steady in the program. He works hard while he is here. Has adequate oxygen at home for exercise. Has started his home exercise routine. Has been extended out to 36 sessions in Pulmonary Rehab. When we get closer to graduation, I will encourage patient to come to maintenance. Will cont. to monitor and progress.       Expected Outcomes  Through  exercise at rehab and at home, patient will increase strength and stamina making ADL's easier to perform. Patient will also have a better understanding of safe exercise and what they are capable to do outside of clinical supervision.  Through exercise at rehab and at home, patient will increase strength and stamina making ADL's easier to perform. Patient will also have a better understanding of safe exercise and what they are capable to do outside of clinical supervision.         Discharge Exercise Prescription (Final Exercise Prescription Changes): Exercise Prescription Changes - 10/30/17 1200      Response to Exercise   Blood Pressure (Admit)  112/52    Blood Pressure (Exercise)  100/60    Blood Pressure (Exit)  90/50    Heart Rate (Admit)  98 bpm    Heart Rate (Exercise)  109 bpm    Heart Rate (Exit)  101 bpm    Oxygen Saturation (Admit)  92 %    Oxygen Saturation (Exercise)  95 %    Oxygen Saturation (Exit)  97 %    Rating of Perceived Exertion (Exercise)  13    Perceived Dyspnea (Exercise)  3    Duration  Progress to 45 minutes of aerobic exercise without signs/symptoms of physical distress    Intensity  THRR unchanged      Progression   Progression  Continue to progress workloads to maintain intensity without signs/symptoms of physical distress.      Resistance Training   Training Prescription  Yes    Weight  blue bands    Reps  10-15    Time  10 Minutes      Oxygen   Oxygen  Continuous    Liters  6-15      NuStep   Level  6    Minutes  17    METs   2.2      Arm Ergometer   Level  6    Minutes  17      Track   Laps  9    Minutes  17       Nutrition:  Target Goals: Understanding of nutrition guidelines, daily intake of sodium <1545m, cholesterol <2090m calories 30% from fat and 7% or less from saturated fats, daily to have 5 or more servings of fruits and vegetables.  Biometrics: Pre Biometrics - 05/28/17 1509      Pre Biometrics   Grip Strength  36 kg        Nutrition Therapy Plan and Nutrition Goals:   Nutrition Assessments:   Nutrition Goals Re-Evaluation:   Nutrition Goals Discharge (Final Nutrition Goals Re-Evaluation):   Psychosocial: Target Goals: Acknowledge presence or absence of significant depression and/or stress, maximize coping skills, provide positive support system. Participant is able to verbalize types and ability to use techniques and skills needed for reducing stress and depression.  Initial Review & Psychosocial Screening: Initial Psych Review & Screening - 05/28/17 1503      Initial Review   Current issues with  None Identified      Family Dynamics   Good Support System?  Yes      Barriers   Psychosocial barriers to participate in program  There are no identifiable barriers or psychosocial needs.      Screening Interventions   Interventions  Encouraged to exercise       Quality of Life Scores:  Scores of 19 and below usually indicate a poorer quality of life in these areas.  A  difference of  2-3 points is a clinically meaningful difference.  A difference of 2-3 points in the total score of the Quality of Life Index has been associated with significant improvement in overall quality of life, self-image, physical symptoms, and general health in studies assessing change in quality of life.   PHQ-9: Recent Review Flowsheet Data    Depression screen [facility] 2/9 05/28/2017   Decreased Interest 0   Down, Depressed, Hopeless 0   PHQ - 2 Score 0   Altered sleeping 0   Tired, decreased  energy 0   Change in appetite 0   Feeling bad or failure about yourself  0   Trouble concentrating 0   Moving slowly or fidgety/restless 0   Suicidal thoughts 0   PHQ-9 Score 0   Difficult doing work/chores Not difficult at all     Interpretation of Total Score  Total Score Depression Severity:  1-4 = Minimal depression, 5-9 = Mild depression, 10-14 = Moderate depression, 15-19 = Moderately severe depression, 20-27 = Severe depression   Psychosocial Evaluation and Intervention: Psychosocial Evaluation - 05/28/17 1506      Psychosocial Evaluation & Interventions   Interventions  Encouraged to exercise with the program and follow exercise prescription    Expected Outcomes  patient will remain free from psychosocial barriers to participation    Continue Psychosocial Services   No Follow up required       Psychosocial Re-Evaluation: Psychosocial Re-Evaluation    Row Name 06/13/17 [place] 07/06/17 1223 08/05/17 2122 08/27/17 1530 09/18/17 1425     Psychosocial Re-Evaluation   Current issues with  None Identified  None Identified  None Identified  None Identified  None Identified   Expected Outcomes  patient will remain free from psychosocial barriers to participation in pulmonary rehab   patient will remain free from psychosocial barriers to participation in pulmonary rehab   patient will remain free from psychosocial barriers to participation in pulmonary rehab   patient will remain free from psychosocial barriers to participation in pulmonary rehab   patient will remain free from psychosocial barriers to participation in pulmonary rehab    Interventions  -  -  -  Encouraged to attend Pulmonary Rehabilitation for the exercise  Encouraged to attend Pulmonary Rehabilitation for the exercise   Continue Psychosocial Services   No Follow up required  No Follow up required  No Follow up required  No Follow up required  No Follow up required   [place] Name 10/11/17 0816 11/05/17 1416            Psychosocial Re-Evaluation   Current issues with  None Identified  None Identified      Expected Outcomes  patient will remain free from psychosocial barriers to participation in pulmonary rehab   patient will remain free from psychosocial barriers to participation in pulmonary rehab       Interventions  Encouraged to attend Pulmonary Rehabilitation for the exercise  Encouraged to attend Pulmonary Rehabilitation for the exercise      Continue Psychosocial Services   No Follow up required  No Follow up required         Psychosocial Discharge (Final Psychosocial Re-Evaluation): Psychosocial Re-Evaluation - 11/05/17 1416      Psychosocial Re-Evaluation   Current issues with  None Identified    Expected Outcomes  patient will remain free from psychosocial barriers to participation in pulmonary rehab     Interventions  Encouraged to attend Pulmonary Rehabilitation for the exercise  Continue Psychosocial Services   No Follow up required       Education: Education Goals: Education classes will be provided on a weekly basis, covering required topics. Participant will state understanding/return demonstration of topics presented.  Learning Barriers/Preferences: Learning Barriers/Preferences - 05/28/17 1502      Learning Barriers/Preferences   Learning Barriers  None    Learning Preferences  Computer/Internet;Group Instruction;Individual Instruction;Written Material       Education Topics: Risk Factor Reduction:  -Group instruction that is supported by a PowerPoint presentation. Instructor discusses the definition of a risk factor, different risk factors for pulmonary disease, and how the heart and lungs work together.     Nutrition for Pulmonary Patient:  -Group instruction provided by PowerPoint slides, verbal discussion, and written materials to support subject matter. The instructor gives an explanation and review of healthy diet recommendations, which includes a discussion on weight  management, recommendations for fruit and vegetable consumption, as well as protein, fluid, caffeine, fiber, sodium, sugar, and alcohol. Tips for eating when patients are short of breath are discussed.   PULMONARY REHAB OTHER RESPIRATORY from 10/25/2017 in [place]  Date  10/18/17  Educator  Nutritionist  Instruction Review Code  2- meets goals/outcomes      Pursed Lip Breathing:  -Group instruction that is supported by demonstration and informational handouts. Instructor discusses the benefits of pursed lip and diaphragmatic breathing and detailed demonstration on how to preform both.     Oxygen Safety:  -Group instruction provided by PowerPoint, verbal discussion, and written material to support subject matter. There is an overview of "What is Oxygen" and "Why do we need it".  Instructor also reviews how to create a safe environment for oxygen use, the importance of using oxygen as prescribed, and the risks of noncompliance. There is a brief discussion on traveling with oxygen and resources the patient may utilize.   Oxygen Equipment:  -Group instruction provided by [facility] Staff utilizing handouts, written materials, and equipment demonstrations.   PULMONARY REHAB OTHER RESPIRATORY from 10/25/2017 in [place]  Date  09/13/17  Educator  [facility]  Instruction Review Code  2- meets goals/outcomes      Signs and Symptoms:  -Group instruction provided by written material and verbal discussion to support subject matter. Warning signs and symptoms of infection, stroke, and heart attack are reviewed and when to call the physician/911 reinforced. Tips for preventing the spread of infection discussed.   PULMONARY REHAB OTHER RESPIRATORY from 10/25/2017 in [place]  Date  07/05/17  Educator  RN  Instruction Review Code  2- meets goals/outcomes      Advanced Directives:  -Group  instruction provided by verbal instruction and written material to support subject matter. Instructor reviews Advanced Directive laws and proper instruction for filling out document.   Pulmonary Video:  -Group video education that reviews the importance of medication and oxygen compliance, exercise, good nutrition, pulmonary hygiene, and pursed lip and diaphragmatic breathing for the pulmonary patient.   PULMONARY REHAB OTHER RESPIRATORY from 10/25/2017 in [place]  Date  07/12/17  Instruction Review Code  2- meets goals/outcomes      Exercise for the Pulmonary Patient:  -Group instruction that is supported by a PowerPoint presentation. Instructor discusses benefits of exercise, core components of exercise, frequency, duration, and intensity of an exercise routine, importance of utilizing pulse oximetry during exercise, safety while exercising, and options of places  to exercise outside of rehab.     PULMONARY REHAB OTHER RESPIRATORY from 10/25/2017 in [place]  Date  08/23/17  Educator  ep  Instruction Review Code  2- meets goals/outcomes      Pulmonary Medications:  -Verbally interactive group education provided by instructor with focus on inhaled medications and proper administration.   PULMONARY REHAB OTHER RESPIRATORY from 10/25/2017 in [place]  Date  08/16/17  Educator  pharmacist  Instruction Review Code  R- Review/reinforce      Anatomy and Physiology of the Respiratory System and Intimacy:  -Group instruction provided by PowerPoint, verbal discussion, and written material to support subject matter. Instructor reviews respiratory cycle and anatomical components of the respiratory system and their functions. Instructor also reviews differences in obstructive and restrictive respiratory diseases with examples of each. Intimacy, Sex, and Sexuality differences are reviewed with a  discussion on how relationships can change when diagnosed with pulmonary disease. Common sexual concerns are reviewed.   MD DAY -A group question and answer session with a medical doctor that allows participants to ask questions that relate to their pulmonary disease state.   PULMONARY REHAB OTHER RESPIRATORY from 10/25/2017 in [place]  Date  10/25/17  Educator  [employer]  Instruction Review Code  R- Review/reinforce      OTHER EDUCATION -Group or individual verbal, written, or video instructions that support the educational goals of the pulmonary rehab program.   Knowledge Questionnaire Score: Knowledge Questionnaire Score - 05/31/17 0901      Knowledge Questionnaire Score   Pre Score  10/13       Core Components/Risk Factors/Patient Goals at Admission: Personal Goals and Risk Factors at Admission - 05/28/17 1503      Core Components/Risk Factors/Patient Goals on Admission   Improve shortness of breath with ADL's  Yes    Intervention  Provide education, individualized exercise plan and daily activity instruction to help decrease symptoms of SOB with activities of daily living.    Expected Outcomes  Short Term: Achieves a reduction of symptoms when performing activities of daily living.    Develop more efficient breathing techniques such as purse lipped breathing and diaphragmatic breathing; and practicing self-pacing with activity  Yes    Intervention  Provide education, demonstration and support about specific breathing techniuqes utilized for more efficient breathing. Include techniques such as pursed lipped breathing, diaphragmatic breathing and self-pacing activity.    Expected Outcomes  Short Term: Participant will be able to demonstrate and use breathing techniques as needed throughout daily activities.    Increase knowledge of respiratory medications and ability to use respiratory devices properly   Yes    Intervention  Provide education and  demonstration as needed of appropriate use of medications, inhalers, and oxygen therapy.    Expected Outcomes  Short Term: Achieves understanding of medications use. Understands that oxygen is a medication prescribed by physician. Demonstrates appropriate use of inhaler and oxygen therapy.       Core Components/Risk Factors/Patient Goals Review:  Goals and Risk Factor Review    Row Name 06/13/17 1819 07/06/17 1222 08/05/17 2120 08/27/17 1528 09/18/17 1424     Core Components/Risk Factors/Patient Goals Review   Personal Goals Review  Improve shortness of breath with ADL's;Develop more efficient breathing techniques such as purse lipped breathing and diaphragmatic breathing and practicing self-pacing with activity.;Increase knowledge of respiratory medications and ability to use respiratory devices properly.  Improve shortness of breath  with ADL's;Develop more efficient breathing techniques such as purse lipped breathing and diaphragmatic breathing and practicing self-pacing with activity.;Increase knowledge of respiratory medications and ability to use respiratory devices properly.  Improve shortness of breath with ADL's;Develop more efficient breathing techniques such as purse lipped breathing and diaphragmatic breathing and practicing self-pacing with activity.;Increase knowledge of respiratory medications and ability to use respiratory devices properly.  Improve shortness of breath with ADL's;Develop more efficient breathing techniques such as purse lipped breathing and diaphragmatic breathing and practicing self-pacing with activity.;Increase knowledge of respiratory medications and ability to use respiratory devices properly.  Improve shortness of breath with ADL's;Develop more efficient breathing techniques such as purse lipped breathing and diaphragmatic breathing and practicing self-pacing with activity.;Increase knowledge of respiratory medications and ability to use respiratory devices properly.    Review  patient has only attended 2 sessions since admission. will evaluate progression towards goals over the next 30 days  patient is making slow progress towards goals related to the initial lack of oxygen orders for exercise. Now have order to maintain saturations 88 or greater with unlimited ammount of oxygen with exertion. He is using plb without cueing and paces himself while taking restbreaks with walking.  patient is now making greater progress in pulmonary rehab related to increase oxygen use parameters. He has perfected his PLB technique and is observed using it during all aspects of his exercise sessios.  patient states his shortness of breath with ADLs has improved. He is using PLB to decrease his shortness of breath during exertional activities. He has attended an education session on pulmonary medications taught by pharmacist and found it extremely informative.  patient states his shortness of breath with ADLs has improved. He is using PLB to decrease his shortness of breath during exertional activities. He has attended an education session on pulmonary medications taught by pharmacist and found it extremely informative. His exercise session have been extended to 36 to maximize his physical health.   Expected Outcomes  see admission outcomes  see admission outcomes  see admission outcomes  see admission outcomes  see admission outcomes   Row Name 10/11/17 0816 11/05/17 1414           Core Components/Risk Factors/Patient Goals Review   Personal Goals Review  Improve shortness of breath with ADL's;Develop more efficient breathing techniques such as purse lipped breathing and diaphragmatic breathing and practicing self-pacing with activity.;Increase knowledge of respiratory medications and ability to use respiratory devices properly.  Improve shortness of breath with ADL's;Develop more efficient breathing techniques such as purse lipped breathing and diaphragmatic breathing and practicing  self-pacing with activity.;Increase knowledge of respiratory medications and ability to use respiratory devices properly.      Review  patient states his shortness of breath with ADLs has improved. He is using PLB to decrease his shortness of breath during exertional activities. He has attended an education session on pulmonary medications taught by pharmacist and found it extremely informative. His exercise session have been extended to 36 to maximize his physical health.  patient continues to states his shortness of breath with ADLS has improved. He continues to use PLB with all exertion. He is scheduled to graduate at his next pulmonary rehab session.       Expected Outcomes  see admission outcomes  see admission outcomes         Core Components/Risk Factors/Patient Goals at Discharge (Final Review):  Goals and Risk Factor Review - 11/05/17 1414      Core Components/Risk Factors/Patient Goals  Review   Personal Goals Review  Improve shortness of breath with ADL's;Develop more efficient breathing techniques such as purse lipped breathing and diaphragmatic breathing and practicing self-pacing with activity.;Increase knowledge of respiratory medications and ability to use respiratory devices properly.    Review  patient continues to states his shortness of breath with ADLS has improved. He continues to use PLB with all exertion. He is scheduled to graduate at his next pulmonary rehab session.     Expected Outcomes  see admission outcomes       ITP Comments:   Comments: Patient has attended 3 pulmonary rehab sessions since admission

## 2017-11-07 ENCOUNTER — Encounter (HOSPITAL_COMMUNITY): Payer: Self-pay | Admitting: *Deleted

## 2017-11-28 DIAGNOSIS — J449 Chronic obstructive pulmonary disease, unspecified: Secondary | ICD-10-CM | POA: Diagnosis not present

## 2017-11-30 ENCOUNTER — Encounter (HOSPITAL_COMMUNITY): Payer: Self-pay

## 2017-11-30 DIAGNOSIS — J439 Emphysema, unspecified: Secondary | ICD-10-CM

## 2017-11-30 NOTE — Progress Notes (Signed)
Discharge Progress Report  Patient Details  Name: Charles Archer MRN: [id number] Date of Birth: 06-02-1936 Referring Provider:     Pulmonary Rehab Walk Test from 05/31/2017 in [place]  Referring Provider  Dr. Lamonte Sakai       Number of Visits: 35   Reason for Discharge:  Patient reached a stable level of exercise. Patient independent in their exercise. Patient has met program and personal goals.  Smoking History:  Social History   Tobacco Use  Smoking Status Former Smoker  . Packs/day: 3.00  . Years: 30.00  . Pack years: 90.00  . Types: Cigarettes  . Last attempt to quit: 09/03/1989  . Years since quitting: 28.2  Smokeless Tobacco Never Used    Diagnosis:  Pulmonary emphysema, unspecified emphysema type ([place])  ADL [facility]: Pulmonary Assessment Scores    Row Name 11/07/17 1456 11/08/17 0726       ADL [facility]   ADL Phase  Exit  Exit    SOB Score total  92  -      CAT Score   CAT Score  19 Entry  -      mMRC Score   mMRC Score  -  3       Initial Exercise Prescription:   Discharge Exercise Prescription (Final Exercise Prescription Changes): Exercise Prescription Changes - 10/30/17 1200      Response to Exercise   Blood Pressure (Admit)  112/52    Blood Pressure (Exercise)  100/60    Blood Pressure (Exit)  90/50    Heart Rate (Admit)  98 bpm    Heart Rate (Exercise)  109 bpm    Heart Rate (Exit)  101 bpm    Oxygen Saturation (Admit)  92 %    Oxygen Saturation (Exercise)  95 %    Oxygen Saturation (Exit)  97 %    Rating of Perceived Exertion (Exercise)  13    Perceived Dyspnea (Exercise)  3    Duration  Progress to 45 minutes of aerobic exercise without signs/symptoms of physical distress    Intensity  THRR unchanged      Progression   Progression  Continue to progress workloads to maintain intensity without signs/symptoms of physical distress.      Resistance Training   Training Prescription  Yes    Weight  blue bands     Reps  10-15    Time  10 Minutes      Oxygen   Oxygen  Continuous    Liters  6-15      NuStep   Level  6    Minutes  17    METs  2.2      Arm Ergometer   Level  6    Minutes  17      Track   Laps  9    Minutes  17       Functional Capacity: 6 Minute Walk    Row Name 11/08/17 0723         6 Minute Walk   Phase  Discharge     Distance  900 feet     Distance Feet Change  500 ft     Walk Time  - 5 minutes and 15 seconds     # of Rest Breaks  1 45 seconds     MPH  1.7     METS  2.3     RPE  11     Perceived Dyspnea   2  Symptoms  Yes (comment)     Comments  used wheelchair     Resting HR  101 bpm     Resting BP  104/60     Resting Oxygen Saturation   98 %     Exercise Oxygen Saturation  during 6 min walk  86 %     Max Ex. HR  127 bpm     Max Ex. BP  124/60       Interval HR   1 Minute HR  114     2 Minute HR  123     3 Minute HR  127     5 Minute HR  122     6 Minute HR  124     2 Minute Post HR  111     Interval Heart Rate?  Yes       Interval Oxygen   Interval Oxygen?  Yes     Baseline Oxygen Saturation %  98 %     1 Minute Oxygen Saturation %  95 %     1 Minute Liters of Oxygen  15 L     2 Minute Oxygen Saturation %  88 %     2 Minute Liters of Oxygen  15 L     3 Minute Oxygen Saturation %  87 %     3 Minute Liters of Oxygen  15 L     5 Minute Oxygen Saturation %  93 %     5 Minute Liters of Oxygen  15 L     6 Minute Oxygen Saturation %  86 %     6 Minute Liters of Oxygen  15 L     2 Minute Post Oxygen Saturation %  97 %     2 Minute Post Liters of Oxygen  15 L        Psychological, QOL, Others - Outcomes: PHQ 2/9: Depression screen PHQ 2/9 05/28/2017  Decreased Interest 0  Down, Depressed, Hopeless 0  PHQ - 2 Score 0  Altered sleeping 0  Tired, decreased energy 0  Change in appetite 0  Feeling bad or failure about yourself  0  Trouble concentrating 0  Moving slowly or fidgety/restless 0  Suicidal thoughts 0  PHQ-9 Score 0   Difficult doing work/chores Not difficult at all    Quality of Life:   Personal Goals: Goals established at orientation with interventions provided to work toward goal.    Personal Goals Discharge: Goals and Risk Factor Review    Row Name 10/11/17 0816 11/05/17 1414 11/30/17 1602         Core Components/Risk Factors/Patient Goals Review   Personal Goals Review  Improve shortness of breath with ADL's;Develop more efficient breathing techniques such as purse lipped breathing and diaphragmatic breathing and practicing self-pacing with activity.;Increase knowledge of respiratory medications and ability to use respiratory devices properly.  Improve shortness of breath with ADL's;Develop more efficient breathing techniques such as purse lipped breathing and diaphragmatic breathing and practicing self-pacing with activity.;Increase knowledge of respiratory medications and ability to use respiratory devices properly.  Improve shortness of breath with ADL's;Develop more efficient breathing techniques such as purse lipped breathing and diaphragmatic breathing and practicing self-pacing with activity.;Increase knowledge of respiratory medications and ability to use respiratory devices properly.     Review  patient states his shortness of breath with ADLs has improved. He is using PLB to decrease his shortness of breath during exertional activities. He has attended an education session  on pulmonary medications taught by pharmacist and found it extremely informative. His exercise session have been extended to 36 to maximize his physical health.  patient continues to states his shortness of breath with ADLS has improved. He continues to use PLB with all exertion. He is scheduled to graduate at his next pulmonary rehab session.   patient met his pulmonary rehab goals at discharge     Expected Outcomes  see admission outcomes  see admission outcomes  see admission outcomes        Exercise Goals and  Review:   Nutrition & Weight - Outcomes:  Post Biometrics - 11/08/17 0725       Post  Biometrics   Grip Strength  37 kg       Nutrition: Nutrition Therapy & Goals - 11/19/17 0820      Nutrition Therapy   Diet  Heart Healthy      Personal Nutrition Goals   Nutrition Goal  Describe the benefit of including fruits, vegetables, whole grains, and low-fat dairy products in a healthy meal plan.      Intervention Plan   Intervention  Prescribe, educate and counsel regarding individualized specific dietary modifications aiming towards targeted core components such as weight, hypertension, lipid management, diabetes, heart failure and other comorbidities.    Expected Outcomes  Short Term Goal: Understand basic principles of dietary content, such as calories, fat, sodium, cholesterol and nutrients.;Long Term Goal: Adherence to prescribed nutrition plan.       Nutrition Discharge: Nutrition Assessments - 11/09/17 1149      Rate Your Plate Scores   Post Score  55       Education Questionnaire Score: Knowledge Questionnaire Score - 11/07/17 1456      Knowledge Questionnaire Score   Post Score  11/13       Goals reviewed with patient.

## 2017-12-05 ENCOUNTER — Other Ambulatory Visit: Payer: Self-pay | Admitting: Emergency Medicine

## 2017-12-10 ENCOUNTER — Telehealth: Payer: Self-pay | Admitting: Emergency Medicine

## 2017-12-10 NOTE — Telephone Encounter (Signed)
Called and spoke with patient, he states that refills have already been sent in. Nothing further needed.

## 2017-12-11 DIAGNOSIS — H25012 Cortical age-related cataract, left eye: Secondary | ICD-10-CM | POA: Diagnosis not present

## 2017-12-11 DIAGNOSIS — H25812 Combined forms of age-related cataract, left eye: Secondary | ICD-10-CM | POA: Diagnosis not present

## 2017-12-11 DIAGNOSIS — H2512 Age-related nuclear cataract, left eye: Secondary | ICD-10-CM | POA: Diagnosis not present

## 2017-12-26 DIAGNOSIS — J449 Chronic obstructive pulmonary disease, unspecified: Secondary | ICD-10-CM | POA: Diagnosis not present

## 2018-01-22 DIAGNOSIS — H2511 Age-related nuclear cataract, right eye: Secondary | ICD-10-CM | POA: Diagnosis not present

## 2018-01-22 DIAGNOSIS — H25041 Posterior subcapsular polar age-related cataract, right eye: Secondary | ICD-10-CM | POA: Diagnosis not present

## 2018-01-22 DIAGNOSIS — H25811 Combined forms of age-related cataract, right eye: Secondary | ICD-10-CM | POA: Diagnosis not present

## 2018-01-25 ENCOUNTER — Ambulatory Visit: Payer: PPO | Admitting: Emergency Medicine

## 2018-01-25 ENCOUNTER — Encounter: Payer: Self-pay | Admitting: Emergency Medicine

## 2018-01-25 DIAGNOSIS — J301 Allergic rhinitis due to pollen: Secondary | ICD-10-CM | POA: Diagnosis not present

## 2018-01-25 DIAGNOSIS — R911 Solitary pulmonary nodule: Secondary | ICD-10-CM

## 2018-01-25 DIAGNOSIS — J449 Chronic obstructive pulmonary disease, unspecified: Secondary | ICD-10-CM | POA: Diagnosis not present

## 2018-01-25 NOTE — Assessment & Plan Note (Signed)
Overall doing well.  He benefited from pulmonary rehab with better functional capacity.  He does note some increased dyspnea over about the last month.  He wonders whether it is related to the air quality and pollen.  This may be the case but of asked him to keep track of the trend.  If his breathing improves then we will ascribe this to transient change.  If his breathing continues to be more difficult or if it worsens then it would be reasonable for us to retry long-acting bronchodilators to see if he gets more benefit.

## 2018-01-25 NOTE — Assessment & Plan Note (Signed)
Continue flonase 

## 2018-01-25 NOTE — Assessment & Plan Note (Signed)
We discussed today, will arrange for repeat CT chest to compare to prior.

## 2018-01-25 NOTE — Patient Instructions (Addendum)
We will repeat your CT chest without contrast to follow your left lung pulmonary nodule.  Please call our office after the Ct is done to let Dr Delton CoombesByrum know that it has been completed. He will review and call you to discuss.  Continue your DuoNeb 4 times a day Keep albuterol available to use 2 puffs as needed for shortness of breath, wheeze, chest tightness.  Continue your oxygen at 3-4L/min Follow with Dr Delton CoombesByrum in 6 months If your breathing worsens in any way, please call our office so we can see you sooner.

## 2018-01-25 NOTE — Progress Notes (Signed)
Subjective:    Patient ID: Charles Archer, male    DOB: 1936-02-01, 82 y.o.   MRN: [id number]  COPD  He complains of cough. There is no shortness of breath or wheezing. Pertinent negatives include no ear pain, fever, headaches, postnasal drip, rhinorrhea, sneezing, sore throat or trouble swallowing. His past medical history is significant for COPD.   82 yo former smoker (90 pk-yrs), hx of HTN, hyperlipemia. Has been evaluated at [facility] for DOE. Stress testing and L heart cath done 09/23/13 and 10/22/13 > non-critical CAD, overall reassuring.  Also history GERD, rhinitis  PFT 12/28/13  >> moderate AFL, no BD response, normal volumes, significantly decreased DLCO.  No new sx, still with exertional SOB. No flares. Last time exertional hypoxemia.    ROV 01/25/18 --Charles Archer is a pleasant 82 year old gentleman who follows up today for his COPD, allergic rhinitis, chronic cough.  He also has an area of inferior lingular rounded atelectasis that we have followed with serial imaging, was getting smaller.  He has hypoxemia and is currently on oxygen at 3-4L/min.  Since last time he has done pulmonary rehab and feels that it has helped his functional capacity. He has had a a bit more trouble for the last month, some increase SOB with exertion. No real change in drainage, cough. No wheeze. He uses scheduled DuoNeb. Hardly ever uses ProAir.    Review of Systems  Constitutional: Negative for fever and unexpected weight change.  HENT: Positive for congestion. Negative for dental problem, ear pain, nosebleeds, postnasal drip, rhinorrhea, sinus pressure, sneezing, sore throat and trouble swallowing.   Eyes: Negative for redness and itching.  Respiratory: Positive for cough. Negative for chest tightness, shortness of breath and wheezing.   Cardiovascular: Negative for palpitations and leg swelling.  Gastrointestinal: Negative for abdominal distention, nausea and vomiting.  Genitourinary: Negative for  dysuria.  Musculoskeletal: Negative for joint swelling.  Skin: Negative for rash.  Neurological: Negative for headaches.  Hematological: Does not bruise/bleed easily.  Psychiatric/Behavioral: Negative for dysphoric mood. The patient is not nervous/anxious.        Objective:   Physical Exam Vitals:   01/25/18 1008  BP: 100/68  Pulse: (!) 109  SpO2: 90%  Weight: 182 lb (82.6 kg)  Height: 5\' 11"  (1.803 m)   Gen: Pleasant, well-nourished, in no distress,  normal affect  ENT: No lesions,  mouth clear,  oropharynx clear, no postnasal drip  Neck: No JVD, no stridor  Lungs: No use of accessory muscles, clear without rales or rhonchi, he does have some wheeze on a forced expiration.   Cardiovascular: RRR, heart sounds normal, no murmur or gallops, no peripheral edema  Musculoskeletal: No deformities, no cyanosis or clubbing  Neuro: alert, non focal  Skin: Warm, no lesions or rashes      Assessment & Plan:  COPD (chronic obstructive pulmonary disease) Overall doing well.  He benefited from pulmonary rehab with better functional capacity.  He does note some increased dyspnea over about the last month.  He wonders whether it is related to the air quality and pollen.  This may be the case but of asked him to keep track of the trend.  If his breathing improves then we will ascribe this to transient change.  If his breathing continues to be more difficult or if it worsens then it would be reasonable for [place] to retry long-acting bronchodilators to see if he gets more benefit.  Allergic rhinitis Continue flonase  Lung nodule We discussed  today, will arrange for repeat CT chest to compare to prior.   Levy Pupaobert Hermilo Dutter, MD, PhD 01/25/2018, 10:53 AM [facility] [phone] or if no answer [phone]

## 2018-01-26 DIAGNOSIS — J449 Chronic obstructive pulmonary disease, unspecified: Secondary | ICD-10-CM | POA: Diagnosis not present

## 2018-02-01 ENCOUNTER — Inpatient Hospital Stay: Admission: RE | Admit: 2018-02-01 | Payer: PPO | Source: Ambulatory Visit

## 2018-02-04 ENCOUNTER — Ambulatory Visit (INDEPENDENT_AMBULATORY_CARE_PROVIDER_SITE_OTHER)
Admission: RE | Admit: 2018-02-04 | Discharge: 2018-02-04 | Disposition: A | Discharge status: Home / Self Care | Payer: PPO | Source: Ambulatory Visit | Attending: Emergency Medicine | Admitting: Emergency Medicine

## 2018-02-04 DIAGNOSIS — R911 Solitary pulmonary nodule: Secondary | ICD-10-CM

## 2018-02-04 DIAGNOSIS — J439 Emphysema, unspecified: Secondary | ICD-10-CM | POA: Diagnosis not present

## 2018-02-25 DIAGNOSIS — J449 Chronic obstructive pulmonary disease, unspecified: Secondary | ICD-10-CM | POA: Diagnosis not present

## 2018-03-28 DIAGNOSIS — J449 Chronic obstructive pulmonary disease, unspecified: Secondary | ICD-10-CM | POA: Diagnosis not present

## 2018-04-26 DIAGNOSIS — N400 Enlarged prostate without lower urinary tract symptoms: Secondary | ICD-10-CM | POA: Diagnosis not present

## 2018-04-26 DIAGNOSIS — M179 Osteoarthritis of knee, unspecified: Secondary | ICD-10-CM | POA: Diagnosis not present

## 2018-04-26 DIAGNOSIS — Z1389 Encounter for screening for other disorder: Secondary | ICD-10-CM | POA: Diagnosis not present

## 2018-04-26 DIAGNOSIS — E7800 Pure hypercholesterolemia, unspecified: Secondary | ICD-10-CM | POA: Diagnosis not present

## 2018-04-26 DIAGNOSIS — R972 Elevated prostate specific antigen [PSA]: Secondary | ICD-10-CM | POA: Diagnosis not present

## 2018-04-26 DIAGNOSIS — Z0000 Encounter for general adult medical examination without abnormal findings: Secondary | ICD-10-CM | POA: Diagnosis not present

## 2018-04-26 DIAGNOSIS — M109 Gout, unspecified: Secondary | ICD-10-CM | POA: Diagnosis not present

## 2018-04-26 DIAGNOSIS — L570 Actinic keratosis: Secondary | ICD-10-CM | POA: Diagnosis not present

## 2018-04-26 DIAGNOSIS — I10 Essential (primary) hypertension: Secondary | ICD-10-CM | POA: Diagnosis not present

## 2018-04-26 DIAGNOSIS — N529 Male erectile dysfunction, unspecified: Secondary | ICD-10-CM | POA: Diagnosis not present

## 2018-04-26 DIAGNOSIS — N183 Chronic kidney disease, stage 3 (moderate): Secondary | ICD-10-CM | POA: Diagnosis not present

## 2018-04-27 DIAGNOSIS — J449 Chronic obstructive pulmonary disease, unspecified: Secondary | ICD-10-CM | POA: Diagnosis not present

## 2018-05-02 ENCOUNTER — Other Ambulatory Visit: Payer: Self-pay | Admitting: Emergency Medicine

## 2018-05-28 DIAGNOSIS — J449 Chronic obstructive pulmonary disease, unspecified: Secondary | ICD-10-CM | POA: Diagnosis not present

## 2018-06-24 IMAGING — DX DG CHEST 2V
2 series · 2 of 2 positions shown · non-contrast
Comparison: 01/17/2017 CT

CLINICAL DATA: Pneumonia, COPD

EXAM:
CHEST  2 VIEW

[chest pa]
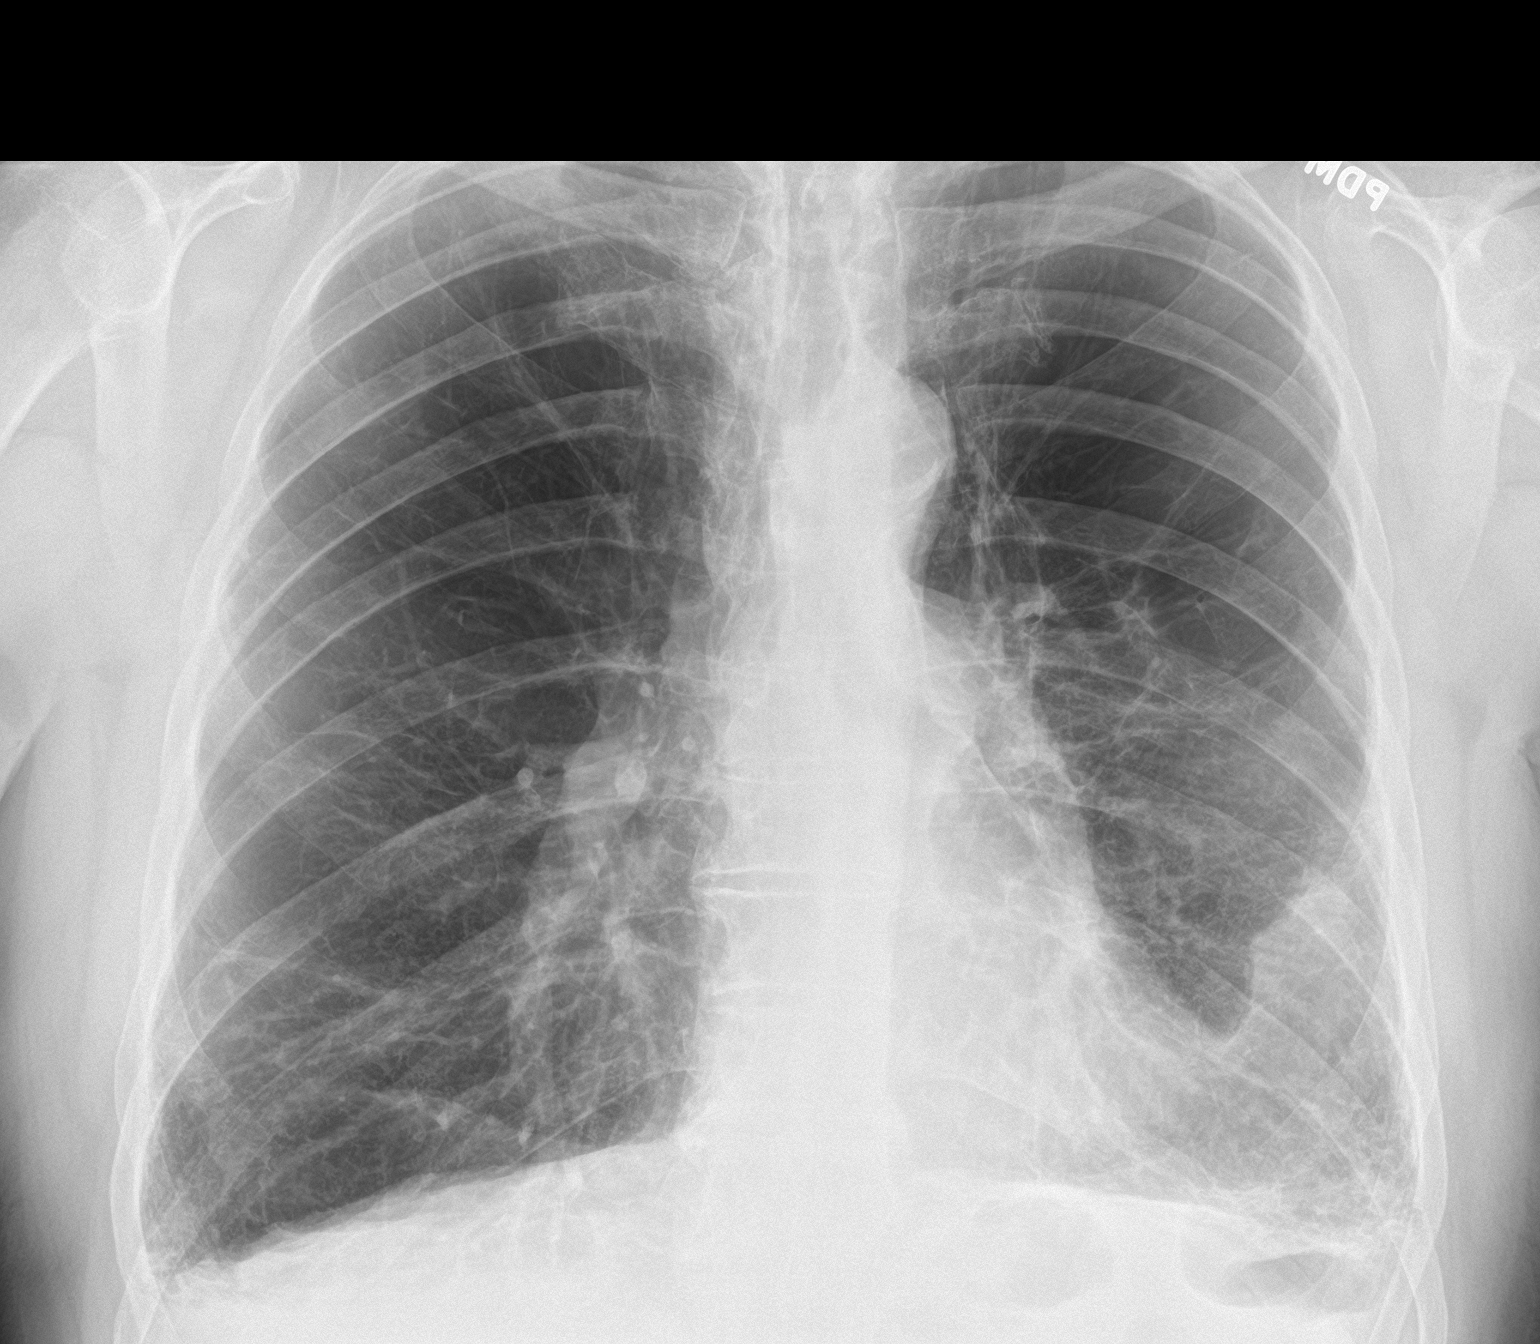

[chest lat]
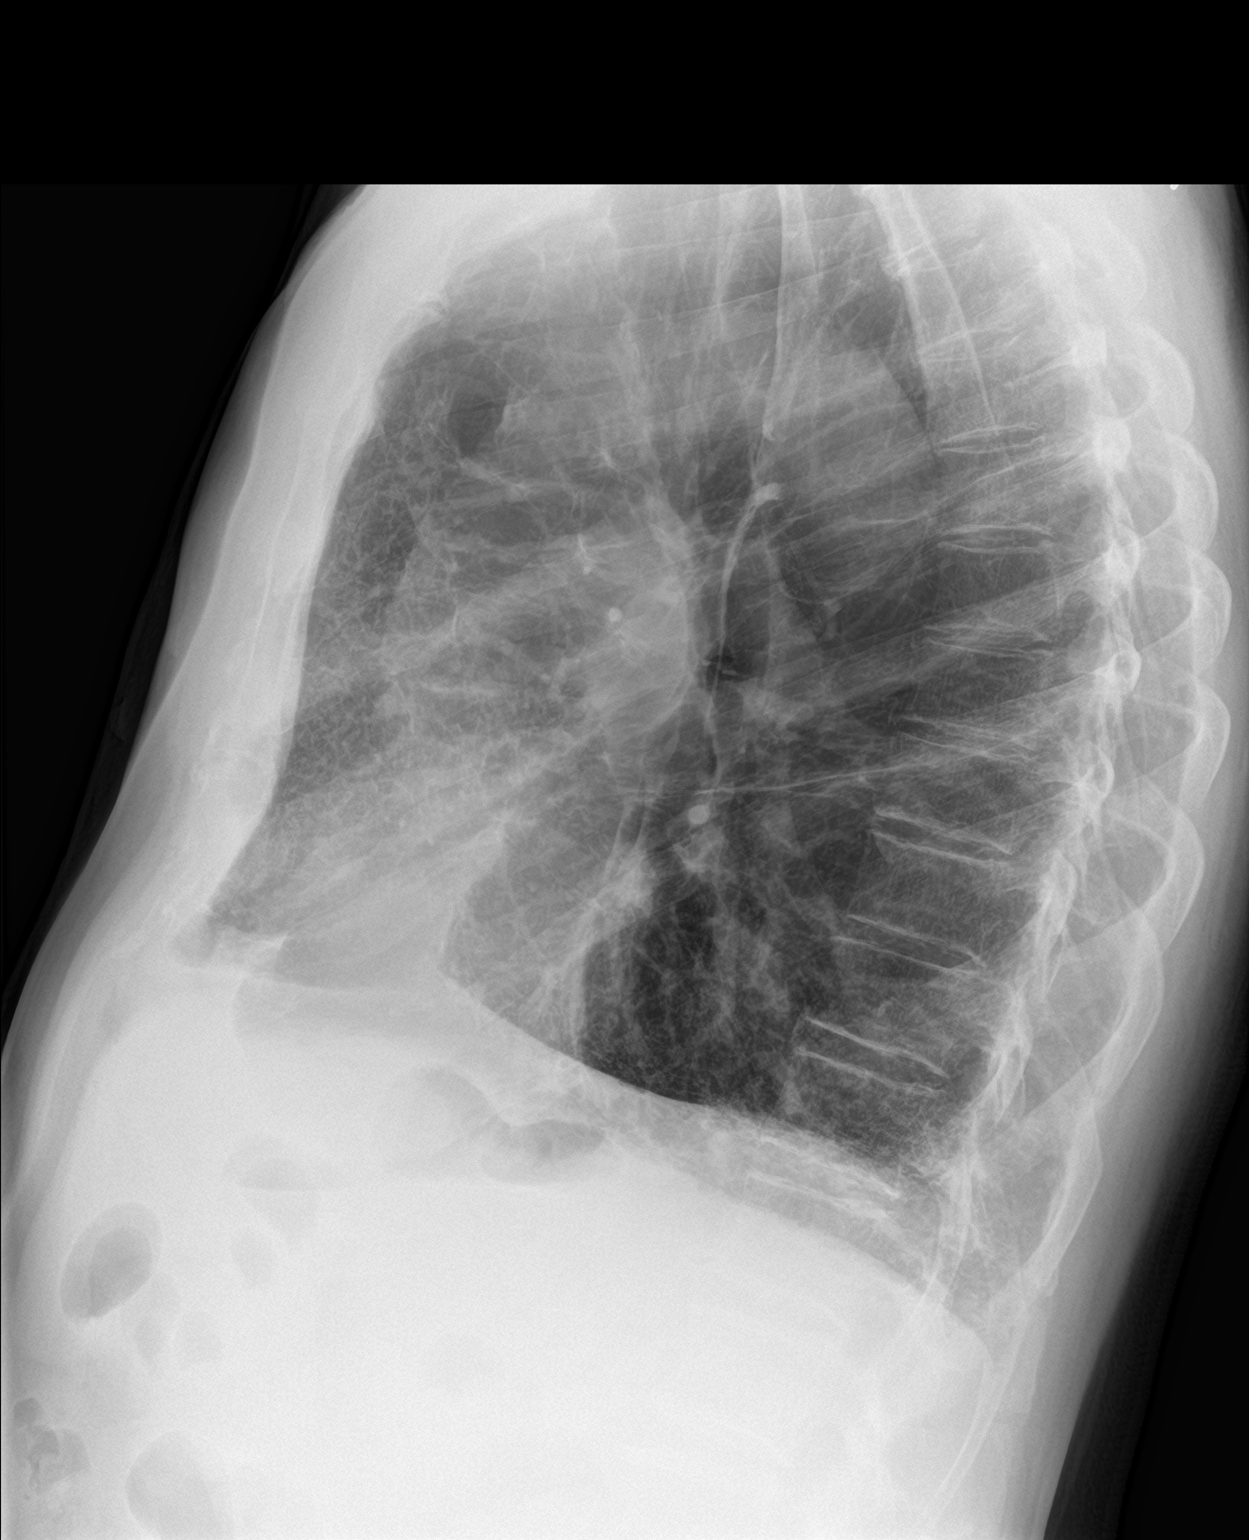

[2 of 2 positions shown; findings below may reference images not displayed]

FINDINGS: Normal cardiac silhouette. Hyperinflated lungs. Masslike pleural
thickening in the lateral LEFT lingula lobe is not changed
comparison CT. No acute osseous abnormality.
IMPRESSION: 1. No acute findings.
2. Severe emphysema.
3. Stable lateral pleural parenchymal thickening in the lingula.

## 2018-06-28 DIAGNOSIS — J449 Chronic obstructive pulmonary disease, unspecified: Secondary | ICD-10-CM | POA: Diagnosis not present

## 2018-07-02 DIAGNOSIS — J449 Chronic obstructive pulmonary disease, unspecified: Secondary | ICD-10-CM | POA: Diagnosis not present

## 2018-07-23 DIAGNOSIS — J449 Chronic obstructive pulmonary disease, unspecified: Secondary | ICD-10-CM | POA: Diagnosis not present

## 2018-07-28 DIAGNOSIS — J449 Chronic obstructive pulmonary disease, unspecified: Secondary | ICD-10-CM | POA: Diagnosis not present

## 2018-07-30 ENCOUNTER — Other Ambulatory Visit: Payer: Self-pay | Admitting: Emergency Medicine

## 2018-07-31 ENCOUNTER — Ambulatory Visit (INDEPENDENT_AMBULATORY_CARE_PROVIDER_SITE_OTHER): Payer: PPO | Admitting: Acute Care

## 2018-07-31 ENCOUNTER — Encounter: Payer: Self-pay | Admitting: Acute Care

## 2018-07-31 ENCOUNTER — Ambulatory Visit: Payer: PPO | Admitting: Emergency Medicine

## 2018-07-31 VITALS — BP 102/60 | HR 111 | Ht 71.0 in | Wt 183.8 lb

## 2018-07-31 DIAGNOSIS — J439 Emphysema, unspecified: Secondary | ICD-10-CM

## 2018-07-31 DIAGNOSIS — R05 Cough: Secondary | ICD-10-CM

## 2018-07-31 DIAGNOSIS — R5381 Other malaise: Secondary | ICD-10-CM

## 2018-07-31 DIAGNOSIS — R911 Solitary pulmonary nodule: Secondary | ICD-10-CM | POA: Diagnosis not present

## 2018-07-31 DIAGNOSIS — J301 Allergic rhinitis due to pollen: Secondary | ICD-10-CM

## 2018-07-31 DIAGNOSIS — J9611 Chronic respiratory failure with hypoxia: Secondary | ICD-10-CM | POA: Diagnosis not present

## 2018-07-31 DIAGNOSIS — R053 Chronic cough: Secondary | ICD-10-CM

## 2018-07-31 NOTE — Assessment & Plan Note (Signed)
Feels he gained benefit and improved  functional capacity with Pulmonary Rehab which ended 10/2017 Feels since he stopped the program, he has lost all benefit gained by the program AEB inability to walk as far without dyspnea . Plan: Referral back to Pulmonary rehab. To " start over" and then I have encouraged him to remain in the maintenance program for continue benefit once he completes the program.

## 2018-07-31 NOTE — Assessment & Plan Note (Signed)
  Follow up CT chest without contrast in 01/2019, follow up nodules Follow up in 6 months with Dr. Delton Coombes Please contact office for sooner follow up if symptoms do not improve or worsen or seek emergency care   .

## 2018-07-31 NOTE — Assessment & Plan Note (Signed)
Cough with PND Consider adding Delsym for cough as needed Call if you need something stronger.

## 2018-07-31 NOTE — Progress Notes (Addendum)
History of Present Illness Charles Archer is a 82 y.o. male former smoker ( 90 pack year smoker Quit 1990) with  COPD, allergic rhinitis, chronic cough.  He also has an area of inferior lingular rounded atelectasis that we have followed with serial imaging, was getting smaller.  He is currently on oxygen at 3-4L/min.  He is followed by Dr. Delton Coombes.  Maintenance:  Scheduled Duo nebs with prn Pro Air ( rarely uses)  07/31/2018  6 month follow up Pt. Presents today for 6 month follow up. Pt. Was last seen in the clinic 01/25/2018 for a check up. He had just completed Pulmonary Rehab at that time and he felt it had greatly improved his functional capacity.He was having baseline issues with dyspnea but was overall doing well. He presents today stating he has worsening dyspnea again. He completed his Pulmonary Rehab in January. He feels he has lost the benefit he gained for the program. He would like to start over again and then continue on with maintenance once he completed the program to prevent deconditioning.He states he is compliant with his Duo neds 4 times daily. He rarely uses his Psychologist, forensic inhaler for rescue. He states he has a runny nose and a cough today.Secretions are clear but thick.He denies fever, chest pain, orthopnea or hemoptysis. He needs his flu vaccine today. Last pneumococcal vaccine was 07/2017.He was on 6 L pulsed oxygen today in the office with sats of 90%.  Test Results:  PFT 12/28/13  >> moderate AFL, no BD response, normal volumes, significantly decreased DLCO.    CBC Latest Ref Rng & Units 10/20/2013  WBC 4.5 - 10.5 K/uL 6.8  Hemoglobin 13.0 - 17.0 g/dL 16.1  Hematocrit 09.6 - 52.0 % 46.3  Platelets 150.0 - 400.0 K/uL 189.0    BMP Latest Ref Rng & Units 10/20/2013  Glucose 70 - 99 mg/dL 045(W)  BUN 6 - 23 mg/dL 16  Creatinine 0.4 - 1.5 mg/dL 1.5  Sodium 098 - 119 mEq/L 142  Potassium 3.5 - 5.1 mEq/L 4.3  Chloride 96 - 112 mEq/L 109  CO2 19 - 32 mEq/L 27  Calcium 8.4 -  10.5 mg/dL 9.2    BNP No results found for: BNP  ProBNP No results found for: PROBNP  PFT    Component Value Date/Time   FEV1PRE 2.22 12/16/2013 1151   FEV1POST 2.37 12/16/2013 1151   FVCPRE 4.23 12/16/2013 1151   FVCPOST 4.44 12/16/2013 1151   TLC 7.29 12/16/2013 1151   DLCOUNC 10.84 12/16/2013 1151   PREFEV1FVCRT 52 12/16/2013 1151   PSTFEV1FVCRT 53 12/16/2013 1151    No results found.   Past medical hx Past Medical History:  Diagnosis Date  . Abnormal stress test    s/p cardiac cath 10/2013 - nonobstructive CAD with normal LV function  . CKD (chronic kidney disease)   . COPD (chronic obstructive pulmonary disease) (HCC)   . Dyspnea on exertion   . Emphysema lung (HCC)   . GERD (gastroesophageal reflux disease)   . Gout   . HTN (hypertension)   . Hyperlipidemia      Social History   Tobacco Use  . Smoking status: Former Smoker    Packs/day: 3.00    Years: 30.00    Pack years: 90.00    Types: Cigarettes    Last attempt to quit: 09/03/1989    Years since quitting: 28.9  . Smokeless tobacco: Never Used  Substance Use Topics  . Alcohol use: No  . Drug  use: No    Mr.Falk reports that he quit smoking about 28 years ago. His smoking use included cigarettes. He has a 90.00 pack-year smoking history. He has never used smokeless tobacco. He reports that he does not drink alcohol or use drugs.  Tobacco Cessation: Former smoker quit 1990  Past surgical hx, Family hx, Social hx all reviewed.  Current Outpatient Medications on File Prior to Visit  Medication Sig  . acetaminophen (TYLENOL ARTHRITIS PAIN) 650 MG CR tablet Take 650 mg by mouth every 8 (eight) hours as needed for pain.  Marland Kitchen albuterol (PROAIR HFA) 108 (90 Base) MCG/ACT inhaler Inhale 2 puffs into the lungs every 6 (six) hours as needed for wheezing or shortness of breath.  . allopurinol (ZYLOPRIM) 100 MG tablet Take 100 mg by mouth daily.  Marland Kitchen aspirin 81 MG tablet Take 81 mg by mouth daily.  . B  Complex-Folic Acid (SUPER B COMPLEX MAXI PO) Take 1 tablet by mouth daily.   . calcium carbonate (OS-CAL) 600 MG TABS tablet Take 600 mg by mouth daily.  . Cinnamon 500 MG capsule Take 1,000 mg by mouth daily.  Marland Kitchen docusate sodium (COLACE) 50 MG capsule Take 50 mg by mouth daily as needed for mild constipation.   . fluorouracil (EFUDEX) 5 % cream Apply 5 % topically 2 (two) times daily as needed.   . fluticasone (FLONASE) 50 MCG/ACT nasal spray PLACE 2 SPRAYS INTO BOTH NOSTRILS DAILY.  . Ginkgo Biloba 40 MG TABS Take 40 mg by mouth daily.   Marland Kitchen ipratropium-albuterol (DUONEB) 0.5-2.5 (3) MG/3ML SOLN INHALE 1 VIAL ( 3 ML'S)  VIA NEBULIZATION BY MOUTH FOUR TIMES A DAY  . irbesartan (AVAPRO) 300 MG tablet Take 1 tablet by mouth daily.  . Misc Natural Products (OSTEO BI-FLEX ADV JOINT SHIELD PO) Take 1,500 mg by mouth daily.  . Misc. Devices (ACAPELLA) MISC Use as directed  . Multiple Vitamin (MULTIVITAMIN) capsule Take 1 capsule by mouth daily.  . Naproxen Sodium (ALEVE PO) Take 220 mg by mouth as needed (for pain).   . niacin 250 MG tablet Take 250 mg by mouth at bedtime.  . sildenafil (REVATIO) 20 MG tablet Take 1 tablet by mouth daily.  . tamsulosin (FLOMAX) 0.4 MG CAPS capsule Take 1 capsule by mouth daily.  . vitamin B-12 (CYANOCOBALAMIN) 1000 MCG tablet Take 1,000 mcg by mouth daily.  Marland Kitchen zinc gluconate 50 MG tablet Take 50 mg by mouth daily.   No current facility-administered medications on file prior to visit.      No Known Allergies  Review Of Systems:  Constitutional:   No  weight loss, night sweats,  Fevers, chills, fatigue, or  lassitude.  HEENT:   No headaches,  Difficulty swallowing,  Tooth/dental problems, or  Sore throat,                No sneezing, itching, ear ache, nasal congestion,+ post nasal drip, runny nose  CV:  No chest pain,  Orthopnea, PND, swelling in lower extremities, anasarca, dizziness, palpitations, syncope.   GI  No heartburn, indigestion, abdominal pain,  nausea, vomiting, diarrhea, change in bowel habits, loss of appetite, bloody stools.   Resp: + baseline  shortness of breath with exertion less at rest.  No excess mucus, no productive cough,  + occasional  non-productive cough,  No coughing up of blood.  No change in color of mucus.  No wheezing.  No chest wall deformity  Skin: no rash or lesions.  GU: no dysuria, change in  color of urine, no urgency or frequency.  No flank pain, no hematuria   MS:  No joint pain or swelling.  No decreased range of motion.  No back pain.  Psych:  No change in mood or affect. No depression or anxiety.  No memory loss.   Vital Signs BP 102/60 (BP Location: Left Arm, Cuff Size: Normal)   Pulse (!) 111   Ht 5\' 11"  (1.803 m)   Wt 183 lb 12.8 oz (83.4 kg)   SpO2 90%   BMI 25.63 kg/m    Physical Exam:  General- No distress,  A&Ox3, MAE x 4, pleasant ENT: No sinus tenderness, TM clear, pale nasal mucosa, no oral exudate,+ post nasal drip, no LAN Cardiac: S1, S2, regular rate and rhythm, no murmur Chest: No wheeze/ rales/ dullness; no accessory muscle use, no nasal flaring, no sternal retractions Abd.: Soft Non-tender, ND, BS +, Body mass index is 25.63 kg/m. Ext: No clubbing cyanosis, edema, no obvious deformities Neuro:  normal strength, MAE x 4, A&O x 3,appropriate Skin: No rashes, warm and dry, no rash or lesions Psych: normal mood and behavior, very pleasant   Assessment/Plan  COPD (chronic obstructive pulmonary disease) Stable interval without flares Some worsening dyspnea Plan: We will continue Nebs 4 times daily as you have been doing  Continue Pro Air for break through shortness of breath as needed. Consider pre-medication with Pro Air prior to activities that make you short of breath. We will refer you back to Pulmonary Rehab to restart the program as you have lost the previous benefit of the program Diagnosis:Empysema Consider Repeat PFT's in 6 months after seeing Dr. Delton Coombes.  Follow  up in 6 months with Dr. Delton Coombes Note your daily symptoms > remember "red flags" for COPD:  Increase in cough, increase in sputum production, increase in shortness of breath or activity intolerance. If you notice these symptoms, please call to be seen.  Please contact office for sooner follow up if symptoms do not improve or worsen or seek emergency care   .   Allergic rhinitis Consider adding Flonase and Zyrtec for runny nose and post nasal gtt. Delsym 1 teaspoon every 12 hours for cough. Follow up in 6 months with Dr. Delton Coombes Please contact office for sooner follow up if symptoms do not improve or worsen or seek emergency care   .   Hypoxemic respiratory failure, chronic (HCC) Saturation goal is 88-92% Continue continuous flow oxygen at 3 L at rest and 4 L with exertion  Lung nodule  Follow up CT chest without contrast in 01/2019, follow up nodules Follow up in 6 months with Dr. Delton Coombes Please contact office for sooner follow up if symptoms do not improve or worsen or seek emergency care   .   Chronic cough Cough with PND Consider adding Delsym for cough as needed Call if you need something stronger.  Physical deconditioning Feels he gained benefit and improved  functional capacity with Pulmonary Rehab which ended 10/2017 Feels since he stopped the program, he has lost all benefit gained by the program AEB inability to walk as far without dyspnea . Plan: Referral back to Pulmonary rehab. To " start over" and then I have encouraged him to remain in the maintenance program for continue benefit once he completes the program.     Bevelyn Ngo, NP 07/31/2018  3:40 PM

## 2018-07-31 NOTE — Assessment & Plan Note (Signed)
Stable interval without flares Some worsening dyspnea Plan: We will continue Nebs 4 times daily as you have been doing  Continue Pro Air for break through shortness of breath as needed. Consider pre-medication with Pro Air prior to activities that make you short of breath. We will refer you back to Pulmonary Rehab to restart the program as you have lost the previous benefit of the program Diagnosis:Empysema Consider Repeat PFT's in 6 months after seeing Dr. Delton Coombes.  Follow up in 6 months with Dr. Delton Coombes Note your daily symptoms > remember "red flags" for COPD:  Increase in cough, increase in sputum production, increase in shortness of breath or activity intolerance. If you notice these symptoms, please call to be seen.  Please contact office for sooner follow up if symptoms do not improve or worsen or seek emergency care   .

## 2018-07-31 NOTE — Assessment & Plan Note (Signed)
Consider adding Flonase and Zyrtec for runny nose and post nasal gtt. Delsym 1 teaspoon every 12 hours for cough. Follow up in 6 months with Dr. Delton Coombes Please contact office for sooner follow up if symptoms do not improve or worsen or seek emergency care   .

## 2018-07-31 NOTE — Patient Instructions (Addendum)
It is nice to meet you today. We will continue Nebs 4 times daily as you have been doing  Continue Pro Air for break through shortness of breath as needed. Consider pre-medication with Pro Air prior to activities that make you short of breath. Consider adding Flonase and Zyrtec for runny nose and post nasal gtt. Delsym 1 teaspoon every 12 hours. We will refer you back to Pulmonary Rehab to restart the program as you have lost the previous benefit of the program Diagnosis:Empysema Flu shot today.  Follow up CT chest without contrast in 01/2019, follow up nodules Consider Repeat PFT's in 6 months after seeing Dr. Delton Coombes.  Follow up in 6 months with Dr. Delton Coombes Note your daily symptoms > remember "red flags" for COPD:  Increase in cough, increase in sputum production, increase in shortness of breath or activity intolerance. If you notice these symptoms, please call to be seen.  Please contact office for sooner follow up if symptoms do not improve or worsen or seek emergency care   .

## 2018-07-31 NOTE — Assessment & Plan Note (Signed)
Saturation goal is 88-92% Continue continuous flow oxygen at 3 L at rest and 4 L with exertion

## 2018-08-07 ENCOUNTER — Telehealth (HOSPITAL_COMMUNITY): Payer: Self-pay

## 2018-08-07 NOTE — Telephone Encounter (Signed)
Pt's insurance is active and benefits verified through [employer] - $15.00 co-pay, no deductible, out of pocket amount of $3,400/$594.17 has been met, no co-insurance, and no pre-authorization is required. Spoke with Sharlet Salina from [facility]. Reference [id number]

## 2018-08-07 NOTE — Telephone Encounter (Signed)
Called and spoke with pt in regards to Pulmonary Rehab - Scheduled orientation on 08/26/18 at 1:30pm. Pt will attend the 1:30pm exc class. Mailed packet.

## 2018-08-23 DIAGNOSIS — J449 Chronic obstructive pulmonary disease, unspecified: Secondary | ICD-10-CM | POA: Diagnosis not present

## 2018-08-26 ENCOUNTER — Encounter (HOSPITAL_COMMUNITY): Payer: Self-pay

## 2018-08-26 ENCOUNTER — Encounter (HOSPITAL_COMMUNITY)
Admission: RE | Admit: 2018-08-26 | Discharge: 2018-08-26 | Disposition: A | Discharge status: Home / Self Care | Payer: PPO | Source: Ambulatory Visit | Attending: Emergency Medicine | Admitting: Emergency Medicine

## 2018-08-26 VITALS — BP 120/62 | HR 107 | Temp 97.7°F | Resp 18 | Ht 71.5 in | Wt 182.5 lb

## 2018-08-26 DIAGNOSIS — J439 Emphysema, unspecified: Secondary | ICD-10-CM | POA: Diagnosis not present

## 2018-08-26 NOTE — Progress Notes (Signed)
Charles Archer 82 y.o. male Pulmonary Rehab Orientation Note Charles Archer who is well known to pulmonary rehab from prior participation - pt graduated from [place] in January.  Charles Archer is referred to pulmonary rehab by Dr. Delton Coombes for pulmonary emphysema.  Pt arrived today in Cardiac and Pulmonary Rehab for orientation to Pulmonary Rehab. He was transported from [facility] via wheel chair. He does carry portable oxygen. Per pt, he uses oxygen continuously. Color good, skin warm and dry. Patient is oriented to time and place. Patient's medical history, psychosocial health, and medications reviewed. 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 Pt is currently retired. Pt hobbies include watching TV - [place].  Pt reports his stress level is non existent.  Pt does not exhibit  signs of depression.  PHQ2/9 score 0/2. Pt shows good  coping skills with positive outlook . Pt is looking forward to returning to exercise.  Pt was unable to keep going with his exercise due to excessive temperatures this summer. Will continue to monitor and evaluate progress toward psychosocial goal(s) of continued mental well being and positive outlook on life. Physical assessment reveals heart rate is normal, breath sounds diminished. Grip strength equal, strong. Distal pulses palpable with no swelling.  Patient reports he does take medications as prescribed. Patient states he follows a Regular diet. The patient reports no specific efforts to gain or lose weight. Pt would like to maintain his weight. Patient's weight will be monitored closely. Demonstration and practice of PLB using pulse oximeter. Patient able to return demonstration satisfactorily. Safety and hand hygiene in the exercise area reviewed with patient. Patient voices understanding of the information reviewed. Department expectations discussed with patient and achievable goals were set. The patient shows enthusiasm about attending  the program and we look forward to working with this nice gentleman. The patient is scheduled for a 6 min walk test on 11.14 and to begin exercise on 11/21 at 1:30 PM. 45 minutes was spent on a variety of activities such as assessment of the patient, obtaining baseline data including height, weight, BMI, and grip strength, verifying medical history, allergies, and current medications, and teaching patient strategies for performing tasks with less respiratory effort with emphasis on pursed lip breathing. Karlene Lineman RN, BSN Cardiac and Emergency planning/management officer   [phone].

## 2018-08-28 DIAGNOSIS — J449 Chronic obstructive pulmonary disease, unspecified: Secondary | ICD-10-CM | POA: Diagnosis not present

## 2018-08-29 ENCOUNTER — Encounter (HOSPITAL_COMMUNITY)
Admission: RE | Admit: 2018-08-29 | Discharge: 2018-08-29 | Disposition: A | Discharge status: Home / Self Care | Payer: PPO | Source: Ambulatory Visit | Attending: Emergency Medicine | Admitting: Emergency Medicine

## 2018-08-29 DIAGNOSIS — J439 Emphysema, unspecified: Secondary | ICD-10-CM

## 2018-08-29 NOTE — Progress Notes (Addendum)
Pulmonary Individual Treatment Plan  Patient Details   Name: Charles Archer MRN: [id number] Date of Birth: November 23, 1935 Referring Provider:     Pulmonary Rehab Walk Test from 08/29/2018 in [facility] CARDIAC [facility]  Referring Provider  Dr. Delton Coombes      Initial Encounter Date:    Pulmonary Rehab Walk Test from 08/29/2018 in [facility] CARDIAC REHAB  Date  08/29/18      Visit Diagnosis: Pulmonary emphysema, unspecified emphysema type (HCC)  Patient's Home Medications on Admission:   Current Outpatient Medications:  .  acetaminophen (TYLENOL ARTHRITIS PAIN) 650 MG CR tablet, Take 650 mg by mouth every 8 (eight) hours as needed for pain., Disp: , Rfl:  .  albuterol (PROAIR HFA) 108 (90 Base) MCG/ACT inhaler, Inhale 2 puffs into the lungs every 6 (six) hours as needed for wheezing or shortness of breath., Disp: 1 Inhaler, Rfl: 5 .  allopurinol (ZYLOPRIM) 100 MG tablet, Take 100 mg by mouth daily., Disp: , Rfl:  .  aspirin 81 MG tablet, Take 81 mg by mouth daily., Disp: , Rfl:  .  B Complex-Folic Acid (SUPER B COMPLEX MAXI PO), Take 1 tablet by mouth daily. , Disp: , Rfl:  .  calcium carbonate (OS-CAL) 600 MG TABS tablet, Take 600 mg by mouth daily., Disp: , Rfl:  .  Cinnamon 500 MG capsule, Take 1,000 mg by mouth daily., Disp: , Rfl:  .  docusate sodium (COLACE) 50 MG capsule, Take 50 mg by mouth daily as needed for mild constipation. , Disp: , Rfl:  .  fluorouracil (EFUDEX) 5 % cream, Apply 5 % topically 2 (two) times daily as needed. , Disp: , Rfl:  .  fluticasone (FLONASE) 50 MCG/ACT nasal spray, PLACE 2 SPRAYS INTO BOTH NOSTRILS DAILY., Disp: 16 g, Rfl: 5 .  Ginkgo Biloba 40 MG TABS, Take 40 mg by mouth daily. , Disp: , Rfl:  .  ipratropium-albuterol (DUONEB) 0.5-2.5 (3) MG/3ML SOLN, INHALE 1 VIAL ( 3 ML'S)  VIA NEBULIZATION BY MOUTH FOUR TIMES A DAY, Disp: 360 mL, Rfl: 1 .  irbesartan (AVAPRO) 300 MG tablet, Take 1 tablet by mouth daily., Disp: , Rfl:   .  Misc Natural Products (OSTEO BI-FLEX ADV JOINT SHIELD PO), Take 1,500 mg by mouth daily., Disp: , Rfl:  .  Misc. Devices (ACAPELLA) MISC, Use as directed, Disp: 1 each, Rfl: 0 .  Multiple Vitamin (MULTIVITAMIN) capsule, Take 1 capsule by mouth daily., Disp: , Rfl:  .  Naproxen Sodium (ALEVE PO), Take 220 mg by mouth as needed (for pain). , Disp: , Rfl:  .  niacin 250 MG tablet, Take 250 mg by mouth at bedtime., Disp: , Rfl:  .  sildenafil (REVATIO) 20 MG tablet, Take 1 tablet by mouth daily., Disp: , Rfl:  .  tamsulosin (FLOMAX) 0.4 MG CAPS capsule, Take 1 capsule by mouth daily., Disp: , Rfl:  .  vitamin B-12 (CYANOCOBALAMIN) 1000 MCG tablet, Take 1,000 mcg by mouth daily., Disp: , Rfl:  .  zinc gluconate 50 MG tablet, Take 50 mg by mouth daily., Disp: , Rfl:   Past Medical History: Past Medical History:  Diagnosis Date  . Abnormal stress test    s/p cardiac cath 10/2013 - nonobstructive CAD with normal LV function  . CKD (chronic kidney disease)   . COPD (chronic obstructive pulmonary disease) (HCC)   . Dyspnea on exertion   . Emphysema lung (HCC)   . GERD (gastroesophageal reflux disease)   .  Gout   . HTN (hypertension)   . Hyperlipidemia     Tobacco Use: Social History   Tobacco Use  Smoking Status Former Smoker  . Packs/day: 3.00  . Years: 30.00  . Pack years: 90.00  . Types: Cigarettes  . Last attempt to quit: 09/03/1989  . Years since quitting: 29.0  Smokeless Tobacco Never Used    Labs: Recent Review Flowsheet Data    There is no flowsheet data to display.      Capillary Blood Glucose: No results found for: GLUCAP   Pulmonary Assessment Scores: Pulmonary Assessment Scores    Row Name 08/29/18 1509 08/29/18 1630       ADL UCSD   ADL Phase  Entry  Entry    SOB Score total  89  -      CAT Score   CAT Score  pre 18  -      mMRC Score   mMRC Score  -  3       Pulmonary Function Assessment:   Exercise Target Goals: Exercise Program  Goal: Individual exercise prescription set using results from initial 6 min walk test and THRR while considering  patient's activity barriers and safety.   Exercise Prescription Goal: Initial exercise prescription builds to 30-45 minutes a day of aerobic activity, 2-3 days per week.  Home exercise guidelines will be given to patient during program as part of exercise prescription that the participant will acknowledge.  Activity Barriers & Risk Stratification: Activity Barriers & Cardiac Risk Stratification - 08/26/18 1405      Activity Barriers & Cardiac Risk Stratification   Activity Barriers  Deconditioning;Arthritis;Joint Problems;Shortness of Breath       6 Minute Walk: 6 Minute Walk    Row Name 08/29/18 1633         6 Minute Walk   Phase  Initial     Distance  615 feet     Walk Time  3.75 minutes     # of Rest Breaks  2     MPH  1.16     METS  1.92     RPE  13     Perceived Dyspnea   3     Symptoms  Yes (comment)     Comments  On 6 L pt desaturated to 84%. Following at 1.5 minute break the pt was bumped up to 8 L, desaturated, then bumped up to 10 L. At 10 L the pt desaturated again and took a seated rest break for the last 45 seconds. The pt was put on 15 L for the remainder of time and saturation levels rose back to acceptable levels.      Resting HR  104 bpm     Resting BP  106/82     Resting Oxygen Saturation   98 %     Exercise Oxygen Saturation  during 6 min walk  84 %     Max Ex. HR  127 bpm     Max Ex. BP  130/76     2 Minute Post BP  110/68       Interval HR   1 Minute HR  117     2 Minute HR  118     3 Minute HR  110     4 Minute HR  118     5 Minute HR  121     6 Minute HR  127     2 Minute Post HR  114  Interval Heart Rate?  Yes       Interval Oxygen   Interval Oxygen?  Yes     Baseline Oxygen Saturation %  98 %     1 Minute Oxygen Saturation %  95 %     1 Minute Liters of Oxygen  6 L     2 Minute Oxygen Saturation %  84 %     2 Minute Liters  of Oxygen  6 L     3 Minute Oxygen Saturation %  86 %     3 Minute Liters of Oxygen  8 L     4 Minute Oxygen Saturation %  97 %     4 Minute Liters of Oxygen  10 L     5 Minute Oxygen Saturation %  90 %     5 Minute Liters of Oxygen  10 L     6 Minute Oxygen Saturation %  84 %     6 Minute Liters of Oxygen  10 L     2 Minute Post Oxygen Saturation %  99 %     2 Minute Post Liters of Oxygen  15 L        Oxygen Initial Assessment: Oxygen Initial Assessment - 08/26/18 1408      Home Oxygen   Home Oxygen Device  E-Tanks;Home Concentrator   [employer]   Sleep Oxygen Prescription  Continuous    Liters per minute  4    Home Exercise Oxygen Prescription  Continuous    Liters per minute  6    Home at Rest Exercise Oxygen Prescription  Continuous    Liters per minute  4    Compliance with Home Oxygen Use  Yes      Initial 6 min Walk   Oxygen Used  Continuous;E-Tanks      Program Oxygen Prescription   Program Oxygen Prescription  Continuous      Intervention   Short Term Goals  To learn and exhibit compliance with exercise, home and travel O2 prescription;To learn and understand importance of maintaining oxygen saturations>88%;To learn and demonstrate proper use of respiratory medications;To learn and understand importance of monitoring SPO2 with pulse oximeter and demonstrate accurate use of the pulse oximeter.;To learn and demonstrate proper pursed lip breathing techniques or other breathing techniques.    Long  Term Goals  Exhibits compliance with exercise, home and travel O2 prescription;Maintenance of O2 saturations>88%;Compliance with respiratory medication;Verbalizes importance of monitoring SPO2 with pulse oximeter and return demonstration;Exhibits proper breathing techniques, such as pursed lip breathing or other method taught during program session;Demonstrates proper use of MDI's       Oxygen Re-Evaluation:   Oxygen Discharge (Final Oxygen Re-Evaluation):   Initial Exercise  Prescription: Initial Exercise Prescription - 08/29/18 1600      Date of Initial Exercise RX and Referring Provider   Date  08/29/18    Referring Provider  Dr. Delton Coombes      Oxygen   Oxygen  Continuous    Liters  15      NuStep   Level  2    SPM  80    Minutes  17      Arm Ergometer   Level  1.5    RPM  80    Minutes  17      Track   Laps  7    Minutes  17      Prescription Details   Frequency (times per week)  2  Duration  Progress to 45 minutes of aerobic exercise without signs/symptoms of physical distress      Intensity   THRR 40-80% of Max Heartrate  56-112    Ratings of Perceived Exertion  11-13    Perceived Dyspnea  0-4      Progression   Progression  Continue to progress workloads to maintain intensity without signs/symptoms of physical distress.      Resistance Training   Training Prescription  Yes    Weight  orange bands    Reps  10-15       Perform Capillary Blood Glucose checks as needed.  Exercise Prescription Changes:   Exercise Comments:   Exercise Goals and Review: Exercise Goals    Row Name 08/26/18 1406             Exercise Goals   Increase Physical Activity  Yes       Intervention  Provide advice, education, support and counseling about physical activity/exercise needs.;Develop an individualized exercise prescription for aerobic and resistive training based on initial evaluation findings, risk stratification, comorbidities and participant's personal goals.       Expected Outcomes  Short Term: Attend rehab on a regular basis to increase amount of physical activity.;Long Term: Exercising regularly at least 3-5 days a week.;Long Term: Add in home exercise to make exercise part of routine and to increase amount of physical activity.       Increase Strength and Stamina  Yes       Intervention  Provide advice, education, support and counseling about physical activity/exercise needs.;Develop an individualized exercise prescription for  aerobic and resistive training based on initial evaluation findings, risk stratification, comorbidities and participant's personal goals.       Expected Outcomes  Short Term: Increase workloads from initial exercise prescription for resistance, speed, and METs.;Short Term: Perform resistance training exercises routinely during rehab and add in resistance training at home;Long Term: Improve cardiorespiratory fitness, muscular endurance and strength as measured by increased METs and functional capacity ( )       Able to understand and use rate of perceived exertion (RPE) scale  Yes       Intervention  Provide education and explanation on how to use RPE scale       Expected Outcomes  Short Term: Able to use RPE daily in rehab to express subjective intensity level;Long Term:  Able to use RPE to guide intensity level when exercising independently       Able to understand and use Dyspnea scale  Yes       Intervention  Provide education and explanation on how to use Dyspnea scale       Expected Outcomes  Short Term: Able to use Dyspnea scale daily in rehab to express subjective sense of shortness of breath during exertion;Long Term: Able to use Dyspnea scale to guide intensity level when exercising independently       Knowledge and understanding of Target Heart Rate Range (THRR)  Yes       Intervention  Provide education and explanation of THRR including how the numbers were predicted and where they are located for reference       Expected Outcomes  Short Term: Able to state/look up THRR;Long Term: Able to use THRR to govern intensity when exercising independently;Short Term: Able to use daily as guideline for intensity in rehab       Understanding of Exercise Prescription  Yes       Intervention  Provide education, explanation, and written  materials on patient's individual exercise prescription       Expected Outcomes  Short Term: Able to explain program exercise prescription;Long Term: Able to explain  home exercise prescription to exercise independently          Exercise Goals Re-Evaluation :   Discharge Exercise Prescription (Final Exercise Prescription Changes):   Nutrition:  Target Goals: Understanding of nutrition guidelines, daily intake of sodium 1500mg , cholesterol 200mg , calories 30% from fat and 7% or less from saturated fats, daily to have 5 or more servings of fruits and vegetables.  Biometrics:    Nutrition Therapy Plan and Nutrition Goals:   Nutrition Assessments:   Nutrition Goals Re-Evaluation:   Nutrition Goals Discharge (Final Nutrition Goals Re-Evaluation):   Psychosocial: Target Goals: Acknowledge presence or absence of significant depression and/or stress, maximize coping skills, provide positive support system. Participant is able to verbalize types and ability to use techniques and skills needed for reducing stress and depression.  Initial Review & Psychosocial Screening: Initial Psych Review & Screening - 08/26/18 1411      Initial Review   Current issues with  None Identified      Family Dynamics   Good Support System?  Yes      Barriers   Psychosocial barriers to participate in program  There are no identifiable barriers or psychosocial needs.;The patient should benefit from training in stress management and relaxation.      Screening Interventions   Interventions  Encouraged to exercise    Expected Outcomes  Long Term goal: The participant improves quality of Life and PHQ9 Scores as seen by post scores and/or verbalization of changes;Short Term goal: Identification and review with participant of any Quality of Life or Depression concerns found by scoring the questionnaire.       Quality of Life Scores:  Scores of 19 and below usually indicate a poorer quality of life in these areas.  A difference of  2-3 points is a clinically meaningful difference.  A difference of 2-3 points in the total score of the Quality of Life Index has been  associated with significant improvement in overall quality of life, self-image, physical symptoms, and general health in studies assessing change in quality of life.   PHQ-9: Recent Review Flowsheet Data    Depression screen [facility] 2/9 08/26/2018 05/28/2017   Decreased Interest 0 0   Down, Depressed, Hopeless 0 0   PHQ - 2 Score 0 0   Altered sleeping 0 0   Tired, decreased energy 2 0   Change in appetite 0 0   Feeling bad or failure about yourself  0 0   Trouble concentrating 0 0   Moving slowly or fidgety/restless 0 0   Suicidal thoughts 0 0   PHQ-9 Score 2 0   Difficult doing work/chores Not difficult at all Not difficult at all     Interpretation of Total Score  Total Score Depression Severity:  1-4 = Minimal depression, 5-9 = Mild depression, 10-14 = Moderate depression, 15-19 = Moderately severe depression, 20-27 = Severe depression   Psychosocial Evaluation and Intervention: Psychosocial Evaluation - 08/26/18 1412      Psychosocial Evaluation & Interventions   Interventions  Encouraged to exercise with the program and follow exercise prescription    Expected Outcomes  patient will remain free from psychosocial barriers to participation    Continue Psychosocial Services   Follow up required by staff       Psychosocial Re-Evaluation:   Psychosocial Discharge (Final Psychosocial Re-Evaluation):  Education: Education Goals: Education classes will be provided on a weekly basis, covering required topics. Participant will state understanding/return demonstration of topics presented.  Learning Barriers/Preferences: Learning Barriers/Preferences - 08/26/18 1412      Learning Barriers/Preferences   Learning Barriers  None    Learning Preferences  Written Material;Individual Instruction;Group Instruction;Verbal Instruction       Education Topics: How Lungs Work and Diseases: - Discuss the anatomy of the lungs and diseases that can affect the lungs, such as  COPD.   Exercise: -Discuss the importance of exercise, FITT principles of exercise, normal and abnormal responses to exercise, and how to exercise safely.   Environmental Irritants: -Discuss types of environmental irritants and how to limit exposure to environmental irritants.   Meds/Inhalers and oxygen: - Discuss respiratory medications, definition of an inhaler and oxygen, and the proper way to use an inhaler and oxygen.   Energy Saving Techniques: - Discuss methods to conserve energy and decrease shortness of breath when performing activities of daily living.    Bronchial Hygiene / Breathing Techniques: - Discuss breathing mechanics, pursed-lip breathing technique,  proper posture, effective ways to clear airways, and other functional breathing techniques   Cleaning Equipment: - Provides group verbal and written instruction about the health risks of elevated stress, cause of high stress, and healthy ways to reduce stress.   Nutrition I: Fats: - Discuss the types of cholesterol, what cholesterol does to the body, and how cholesterol levels can be controlled.   Nutrition II: Labels: -Discuss the different components of food labels and how to read food labels.   Respiratory Infections: - Discuss the signs and symptoms of respiratory infections, ways to prevent respiratory infections, and the importance of seeking medical treatment when having a respiratory infection.   Stress I: Signs and Symptoms: - Discuss the causes of stress, how stress may lead to anxiety and depression, and ways to limit stress.   Stress II: Relaxation: -Discuss relaxation techniques to limit stress.   Oxygen for Home/Travel: - Discuss how to prepare for travel when on oxygen and proper ways to transport and store oxygen to ensure safety.   Knowledge Questionnaire Score: Knowledge Questionnaire Score - 08/29/18 1511      Knowledge Questionnaire Score   Pre Score  18/18       Core  Components/Risk Factors/Patient Goals at Admission: Personal Goals and Risk Factors at Admission - 08/26/18 1413      Core Components/Risk Factors/Patient Goals on Admission    Weight Management  Weight Maintenance;Yes    Intervention  Weight Management: Provide education and appropriate resources to help participant work on and attain dietary goals.;Weight Management: Develop a combined nutrition and exercise program designed to reach desired caloric intake, while maintaining appropriate intake of nutrient and fiber, sodium and fats, and appropriate energy expenditure required for the weight goal.    Expected Outcomes  Short Term: Continue to assess and modify interventions until short term weight is achieved;Weight Maintenance: Understanding of the daily nutrition guidelines, which includes 25-35% calories from fat, 7% or less cal from saturated fats, less than 200mg  cholesterol, less than 1.5gm of sodium, & 5 or more servings of fruits and vegetables daily;Long Term: Adherence to nutrition and physical activity/exercise program aimed toward attainment of established weight goal;Understanding recommendations for meals to include 15-35% energy as protein, 25-35% energy from fat, 35-60% energy from carbohydrates, less than 200mg  of dietary cholesterol, 20-35 gm of total fiber daily;Understanding of distribution of calorie intake throughout the day with the consumption  of 4-5 meals/snacks    Improve shortness of breath with ADL's  Yes    Intervention  Provide education, individualized exercise plan and daily activity instruction to help decrease symptoms of SOB with activities of daily living.    Expected Outcomes  Short Term: Improve cardiorespiratory fitness to achieve a reduction of symptoms when performing ADLs;Long Term: Be able to perform more ADLs without symptoms or delay the onset of symptoms    Hypertension  Yes    Intervention  Provide education on lifestyle modifcations including regular  physical activity/exercise, weight management, moderate sodium restriction and increased consumption of fresh fruit, vegetables, and low fat dairy, alcohol moderation, and smoking cessation.;Monitor prescription use compliance.    Expected Outcomes  Short Term: Continued assessment and intervention until BP is < 140/28mm HG in hypertensive participants. < 130/44mm HG in hypertensive participants with diabetes, heart failure or chronic kidney disease.;Long Term: Maintenance of blood pressure at goal levels.    Lipids  Yes    Intervention  Provide education and support for participant on nutrition & aerobic/resistive exercise along with prescribed medications to achieve LDL 70mg , HDL >40mg .    Expected Outcomes  Short Term: Participant states understanding of desired cholesterol values and is compliant with medications prescribed. Participant is following exercise prescription and nutrition guidelines.;Long Term: Cholesterol controlled with medications as prescribed, with individualized exercise RX and with personalized nutrition plan. Value goals: LDL < 70mg , HDL > 40 mg.       Core Components/Risk Factors/Patient Goals Review:    Core Components/Risk Factors/Patient Goals at Discharge (Final Review):    ITP Comments: ITP Comments    Row Name 08/26/18 1354           ITP Comments  Dr. Charlestine Massed, Medical Director Pulmonary Rehab          Comments:

## 2018-09-05 ENCOUNTER — Encounter (HOSPITAL_COMMUNITY)
Admission: RE | Admit: 2018-09-05 | Discharge: 2018-09-05 | Disposition: A | Discharge status: Home / Self Care | Payer: PPO | Source: Ambulatory Visit | Attending: Emergency Medicine | Admitting: Emergency Medicine

## 2018-09-05 DIAGNOSIS — J439 Emphysema, unspecified: Secondary | ICD-10-CM

## 2018-09-05 NOTE — Progress Notes (Signed)
Daily Session Note  Patient Details  Name: Charles Archer MRN: [id number] Date of Birth: [birth year] Referring Provider:     Pulmonary Rehab Walk Test from 08/29/2018 in [place]  Referring Provider  Dr. Lamonte Sakai      Encounter Date: 09/05/2018  Check In: Session Check In - 09/05/18 1330      Check-In   Supervising physician immediately available to respond to emergencies  [facility] immediately available    Physician(s)  Dr. Mikle Bosworth    Location  [facility]    Staff Present  Rosebud Poles, RN, BSN;[name], RN, BSN;Lisa Ysidro Evert, RN;Dalton Fletcher, MS, Exercise Physiologist    Medication changes reported      No    Fall or balance concerns reported     No    Tobacco Cessation  No Change    Warm-up and Cool-down  Performed as group-led instruction    Resistance Training Performed  Yes    VAD Patient?  No    PAD/SET Patient?  No      Pain Assessment   Currently in Pain?  No/denies    Multiple Pain Sites  No       Capillary Blood Glucose: No results found for this or any previous visit (from the past 24 hour(s)).    Social History   Tobacco Use  Smoking Status Former Smoker  . Packs/day: 3.00  . Years: 30.00  . Pack years: 90.00  . Types: Cigarettes  . Last attempt to quit: 09/03/1989  . Years since quitting: 29.0  Smokeless Tobacco Never Used    Goals Met:  Proper associated with RPD/PD & O2 Sat Exercise tolerated well  Goals Unmet:  Not Applicable  Comments: Service time is from 1330 to 1510    Dr. Rush Farmer is Medical Director for Pulmonary Rehab at [facility].

## 2018-09-10 ENCOUNTER — Encounter (HOSPITAL_COMMUNITY)
Admission: RE | Admit: 2018-09-10 | Discharge: 2018-09-10 | Disposition: A | Discharge status: Home / Self Care | Payer: PPO | Source: Ambulatory Visit | Attending: Emergency Medicine | Admitting: Emergency Medicine

## 2018-09-10 VITALS — Wt 183.2 lb

## 2018-09-10 DIAGNOSIS — J439 Emphysema, unspecified: Secondary | ICD-10-CM

## 2018-09-10 NOTE — Progress Notes (Signed)
Daily Session Note  Patient Details  Name: Charles Archer MRN: [id number] Date of Birth: [birth year] Referring Provider:     Pulmonary Rehab Walk Test from 08/29/2018 in [place]  Referring Provider  Dr. Lamonte Sakai      Encounter Date: 09/10/2018  Check In: Session Check In - 09/10/18 1537      Check-In   Supervising physician immediately available to respond to emergencies  [facility] immediately available    Physician(s)  Dr. Starla Link    Location  [facility]    Staff Present  Rosebud Poles, RN, BSN;[name], RN, Bjorn Loser, MS, Exercise Physiologist;Clinton Dragone Ysidro Evert, RN    Medication changes reported      No    Fall or balance concerns reported     No    Tobacco Cessation  No Change    Warm-up and Cool-down  Performed as group-led instruction    Resistance Training Performed  Yes    VAD Patient?  No    PAD/SET Patient?  No      Pain Assessment   Currently in Pain?  No/denies    Pain Score  0-No pain    Multiple Pain Sites  No       Capillary Blood Glucose: No results found for this or any previous visit (from the past 24 hour(s)).  Exercise Prescription Changes - 09/10/18 1600      Response to Exercise   Blood Pressure (Admit)  100/60    Blood Pressure (Exercise)  124/60    Blood Pressure (Exit)  99/67    Heart Rate (Admit)  107 bpm    Heart Rate (Exercise)  121 bpm    Heart Rate (Exit)  99 bpm    Oxygen Saturation (Admit)  98 %    Oxygen Saturation (Exercise)  95 %    Oxygen Saturation (Exit)  99 %    Rating of Perceived Exertion (Exercise)  15    Perceived Dyspnea (Exercise)  3    Duration  Progress to 45 minutes of aerobic exercise without signs/symptoms of physical distress    Intensity  Other (comment)   40-80% of HRR     Progression   Progression  Continue to progress workloads to maintain intensity without signs/symptoms of physical distress.      Resistance Training   Training  Prescription  Yes    Weight  orange bands    Reps  10-15    Time  10 Minutes      NuStep   Level  2    SPM  80    Minutes  17      Arm Ergometer   Level  2    Minutes  17      Track   Laps  5    Minutes  17       Social History   Tobacco Use  Smoking Status Former Smoker  . Packs/day: 3.00  . Years: 30.00  . Pack years: 90.00  . Types: Cigarettes  . Last attempt to quit: 09/03/1989  . Years since quitting: 29.0  Smokeless Tobacco Never Used    Goals Met:  Exercise tolerated well No report of cardiac concerns or symptoms Strength training completed today  Goals Unmet:  Not Applicable  Comments: Service time is from 1330 to 1505    Dr. Rush Farmer is Medical Director for Pulmonary Rehab at [facility].

## 2018-09-10 NOTE — Progress Notes (Signed)
Daily Session Note  Patient Details  Name: Charles Archer MRN: [id number] Date of Birth: 10-28-1935 Referring Provider:     Pulmonary Rehab Walk Test from 08/29/2018 in [place]  Referring Provider  Dr. Lamonte Sakai      Encounter Date: 09/10/2018  Check In: Session Check In - 09/10/18 1537      Check-In   Supervising physician immediately available to respond to emergencies  [facility] immediately available    Physician(s)  Dr. Starla Link    Location  [facility]    Staff Present  Rosebud Poles, RN, BSN;[name], RN, Bjorn Loser, MS, Exercise Physiologist;Reanna Scoggin Ysidro Evert, RN    Medication changes reported      No    Fall or balance concerns reported     No    Tobacco Cessation  No Change    Warm-up and Cool-down  Performed as group-led instruction    Resistance Training Performed  Yes    VAD Patient?  No    PAD/SET Patient?  No      Pain Assessment   Currently in Pain?  No/denies    Pain Score  0-No pain    Multiple Pain Sites  No       Capillary Blood Glucose: No results found for this or any previous visit (from the past 24 hour(s)).  Exercise Prescription Changes - 09/10/18 1600      Response to Exercise   Blood Pressure (Admit)  100/60    Blood Pressure (Exercise)  124/60    Blood Pressure (Exit)  99/67    Heart Rate (Admit)  107 bpm    Heart Rate (Exercise)  121 bpm    Heart Rate (Exit)  99 bpm    Oxygen Saturation (Admit)  98 %    Oxygen Saturation (Exercise)  95 %    Oxygen Saturation (Exit)  99 %    Rating of Perceived Exertion (Exercise)  15    Perceived Dyspnea (Exercise)  3    Duration  Progress to 45 minutes of aerobic exercise without signs/symptoms of physical distress    Intensity  Other (comment)   40-80% of HRR     Progression   Progression  Continue to progress workloads to maintain intensity without signs/symptoms of physical distress.      Resistance Training   Training  Prescription  Yes    Weight  orange bands    Reps  10-15    Time  10 Minutes      NuStep   Level  2    SPM  80    Minutes  17      Arm Ergometer   Level  2    Minutes  17      Track   Laps  5    Minutes  17       Social History   Tobacco Use  Smoking Status Former Smoker  . Packs/day: 3.00  . Years: 30.00  . Pack years: 90.00  . Types: Cigarettes  . Last attempt to quit: 09/03/1989  . Years since quitting: 29.0  Smokeless Tobacco Never Used    Goals Met:  Exercise tolerated well No report of cardiac concerns or symptoms Strength training completed today  Goals Unmet:  Not Applicable  Comments: Service time is from 1330 to 1505    Dr. Rush Farmer is Medical Director for Pulmonary Rehab at [facility].

## 2018-09-13 NOTE — Progress Notes (Signed)
Charles Archer 82 y.o. male  DOB: 1935/11/29 MRN: [id number]           Nutrition Note 1. Pulmonary emphysema, unspecified emphysema type (HCC)    Past Medical History:  Diagnosis Date  . Abnormal stress test    s/p cardiac cath 10/2013 - nonobstructive CAD with normal LV function  . CKD (chronic kidney disease)   . COPD (chronic obstructive pulmonary disease) (HCC)   . Dyspnea on exertion   . Emphysema lung (HCC)   . GERD (gastroesophageal reflux disease)   . Gout   . HTN (hypertension)   . Hyperlipidemia    Meds reviewed.    Current Outpatient Medications (Cardiovascular):  .  irbesartan (AVAPRO) 300 MG tablet, Take 1 tablet by mouth daily. .  sildenafil (REVATIO) 20 MG tablet, Take 1 tablet by mouth daily.  Current Outpatient Medications (Respiratory):  .  albuterol (PROAIR HFA) 108 (90 Base) MCG/ACT inhaler, Inhale 2 puffs into the lungs every 6 (six) hours as needed for wheezing or shortness of breath. .  fluticasone (FLONASE) 50 MCG/ACT nasal spray, PLACE 2 SPRAYS INTO BOTH NOSTRILS DAILY. Marland Kitchen  ipratropium-albuterol (DUONEB) 0.5-2.5 (3) MG/3ML SOLN, INHALE 1 VIAL ( 3 ML'S)  VIA NEBULIZATION BY MOUTH FOUR TIMES A DAY  Current Outpatient Medications (Analgesics):  .  acetaminophen (TYLENOL ARTHRITIS PAIN) 650 MG CR tablet, Take 650 mg by mouth every 8 (eight) hours as needed for pain. Marland Kitchen  allopurinol (ZYLOPRIM) 100 MG tablet, Take 100 mg by mouth daily. Marland Kitchen  aspirin 81 MG tablet, Take 81 mg by mouth daily. .  Naproxen Sodium (ALEVE PO), Take 220 mg by mouth as needed (for pain).   Current Outpatient Medications (Hematological):  .  vitamin B-12 (CYANOCOBALAMIN) 1000 MCG tablet, Take 1,000 mcg by mouth daily.  Current Outpatient Medications (Other):  Marland Kitchen  B Complex-Folic Acid (SUPER B COMPLEX MAXI PO), Take 1 tablet by mouth daily.  .  calcium carbonate (OS-CAL) 600 MG TABS tablet, Take 600 mg by mouth daily. .  Cinnamon 500 MG capsule, Take 1,000 mg by mouth daily. Marland Kitchen  docusate  sodium (COLACE) 50 MG capsule, Take 50 mg by mouth daily as needed for mild constipation.  .  fluorouracil (EFUDEX) 5 % cream, Apply 5 % topically 2 (two) times daily as needed.  .  Ginkgo Biloba 40 MG TABS, Take 40 mg by mouth daily.  .  Misc Natural Products (OSTEO BI-FLEX ADV JOINT SHIELD PO), Take 1,500 mg by mouth daily. .  Misc. Devices (ACAPELLA) MISC, Use as directed .  Multiple Vitamin (MULTIVITAMIN) capsule, Take 1 capsule by mouth daily. .  niacin 250 MG tablet, Take 250 mg by mouth at bedtime. .  tamsulosin (FLOMAX) 0.4 MG CAPS capsule, Take 1 capsule by mouth daily. Marland Kitchen  zinc gluconate 50 MG tablet, Take 50 mg by mouth daily.   Ht: Ht Readings from Last 1 Encounters:  08/26/18 5' 11.5" (1.816 m)     Wt:  Wt Readings from Last 3 Encounters:  09/10/18 183 lb 3.2 oz (83.1 kg)  08/26/18 182 lb 8.7 oz (82.8 kg)  07/31/18 183 lb 12.8 oz (83.4 kg)     BMI: Body mass index is 25.1 kg/m.    Current tobacco use? No    Labs:  Lipid Panel  No results found for: CHOL, TRIG, HDL, CHOLHDL, VLDL, LDLCALC, LDLDIRECT  No results found for: HGBA1C  Nutrition Diagnosis  ? Food-and nutrition-related knowledge deficit related to lack of exposure to information as related to  diagnosis of pulmonary disease  Goal(s) 1. To be determined  Plan:  Pt to attend Pulmonary Nutrition class Will provide client-centered nutrition education as part of interdisciplinary care.    Monitor and Evaluate progress toward nutrition goal with team.   Ross MarcusAubrey Burklin, MS, RD, LDN 09/13/2018 12:55 PM

## 2018-09-17 ENCOUNTER — Encounter (HOSPITAL_COMMUNITY)
Admission: RE | Admit: 2018-09-17 | Discharge: 2018-09-17 | Disposition: A | Discharge status: Home / Self Care | Payer: PPO | Source: Ambulatory Visit | Attending: Emergency Medicine | Admitting: Emergency Medicine

## 2018-09-17 DIAGNOSIS — J439 Emphysema, unspecified: Secondary | ICD-10-CM | POA: Insufficient documentation

## 2018-09-17 NOTE — Progress Notes (Signed)
Daily Session Note  Patient Details  Name: Charles Archer MRN: [id number] Date of Birth: Jan 04, 1936 Referring Provider:     Pulmonary Rehab Walk Test from 08/29/2018 in [place]  Referring Provider  Dr. Lamonte Sakai      Encounter Date: 09/17/2018  Check In: Session Check In - 09/17/18 1346      Check-In   Supervising physician immediately available to respond to emergencies  [facility] immediately available    Physician(s)  Dr. Bonner Puna    Location  [facility]    Staff Present  Rosebud Poles, RN, BSN;[name], RN, Bjorn Loser, MS, Exercise Physiologist;Lory Galan Ysidro Evert, RN    Medication changes reported      No    Fall or balance concerns reported     No    Tobacco Cessation  No Change    Warm-up and Cool-down  Performed as group-led instruction    Resistance Training Performed  Yes    VAD Patient?  No    PAD/SET Patient?  No      Pain Assessment   Currently in Pain?  No/denies    Pain Score  0-No pain    Multiple Pain Sites  No       Capillary Blood Glucose: No results found for this or any previous visit (from the past 24 hour(s)).    Social History   Tobacco Use  Smoking Status Former Smoker  . Packs/day: 3.00  . Years: 30.00  . Pack years: 90.00  . Types: Cigarettes  . Last attempt to quit: 09/03/1989  . Years since quitting: 29.0  Smokeless Tobacco Never Used    Goals Met:  Exercise tolerated well No report of cardiac concerns or symptoms Strength training completed today  Goals Unmet:  Not Applicable  Comments: Service time is from 1330 to 1520    Dr. Rush Farmer is Medical Director for Pulmonary Rehab at [facility].

## 2018-09-19 ENCOUNTER — Encounter (HOSPITAL_COMMUNITY)
Admission: RE | Admit: 2018-09-19 | Discharge: 2018-09-19 | Disposition: A | Discharge status: Home / Self Care | Payer: PPO | Source: Ambulatory Visit | Attending: Emergency Medicine | Admitting: Emergency Medicine

## 2018-09-19 DIAGNOSIS — J439 Emphysema, unspecified: Secondary | ICD-10-CM | POA: Diagnosis not present

## 2018-09-19 NOTE — Progress Notes (Signed)
Daily Session Note  Patient Details  Name: Charles Archer MRN: [id number] Date of Birth: 01-27-1936 Referring Provider:     Pulmonary Rehab Walk Test from 08/29/2018 in [place]  Referring Provider  Dr. Lamonte Sakai      Encounter Date: 09/19/2018  Check In: Session Check In - 09/19/18 1529      Check-In   Supervising physician immediately available to respond to emergencies  [facility] immediately available    Physician(s)  Dr. Wyline Copas    Location  [facility]    Staff Present  Maurice Small, RN, Maxcine Ham, RN, Bjorn Loser, MS, Exercise Physiologist;Lisa Ysidro Evert, RN    Medication changes reported      No    Fall or balance concerns reported     No    Tobacco Cessation  No Change    Warm-up and Cool-down  Performed as group-led instruction    Resistance Training Performed  Yes    VAD Patient?  No    PAD/SET Patient?  No      Pain Assessment   Currently in Pain?  No/denies    Pain Score  0-No pain    Multiple Pain Sites  No       Capillary Blood Glucose: No results found for this or any previous visit (from the past 24 hour(s)).    Social History   Tobacco Use  Smoking Status Former Smoker  . Packs/day: 3.00  . Years: 30.00  . Pack years: 90.00  . Types: Cigarettes  . Last attempt to quit: 09/03/1989  . Years since quitting: 29.0  Smokeless Tobacco Never Used    Goals Met:  Proper associated with RPD/PD & O2 Sat Exercise tolerated well  Goals Unmet:  Not Applicable  Comments: Service time is from 1330 to [place]    Dr. Rush Farmer is Medical Director for Pulmonary Rehab at [facility].

## 2018-09-22 DIAGNOSIS — J449 Chronic obstructive pulmonary disease, unspecified: Secondary | ICD-10-CM | POA: Diagnosis not present

## 2018-09-24 ENCOUNTER — Encounter (HOSPITAL_COMMUNITY)
Admission: RE | Admit: 2018-09-24 | Discharge: 2018-09-24 | Disposition: A | Discharge status: Home / Self Care | Payer: PPO | Source: Ambulatory Visit | Attending: Emergency Medicine | Admitting: Emergency Medicine

## 2018-09-24 VITALS — Wt 183.6 lb

## 2018-09-24 DIAGNOSIS — J439 Emphysema, unspecified: Secondary | ICD-10-CM

## 2018-09-24 NOTE — Progress Notes (Signed)
Daily Session Note  Patient Details  Name: LAWYER WASHABAUGH MRN: [id number] Date of Birth: [birth year] Referring Provider:     Pulmonary Rehab Walk Test from 08/29/2018 in [place]  Referring Provider  Dr. Lamonte Sakai      Encounter Date: 09/24/2018  Check In: Session Check In - 09/24/18 1531      Check-In   Supervising physician immediately available to respond to emergencies  [facility] immediately available    Physician(s)  Dr. Wyline Copas    Location  [facility]    Staff Present  Rosebud Poles, RN, BSN;[name], RN, Bjorn Loser, MS, Exercise Physiologist;Lisa Ysidro Evert, RN    Medication changes reported      No    Fall or balance concerns reported     No    Tobacco Cessation  No Change    Warm-up and Cool-down  Performed as group-led instruction    Resistance Training Performed  Yes    VAD Patient?  No    PAD/SET Patient?  No      Pain Assessment   Currently in Pain?  No/denies    Pain Score  0-No pain    Multiple Pain Sites  No       Capillary Blood Glucose: No results found for this or any previous visit (from the past 24 hour(s)).  Exercise Prescription Changes - 09/24/18 1600      Response to Exercise   Blood Pressure (Admit)  112/64    Blood Pressure (Exercise)  120/70    Blood Pressure (Exit)  120/60    Heart Rate (Admit)  110 bpm    Heart Rate (Exercise)  111 bpm    Heart Rate (Exit)  103 bpm    Oxygen Saturation (Admit)  99 %    Oxygen Saturation (Exercise)  98 %    Oxygen Saturation (Exit)  98 %    Rating of Perceived Exertion (Exercise)  15    Perceived Dyspnea (Exercise)  3    Duration  Progress to 45 minutes of aerobic exercise without signs/symptoms of physical distress    Intensity  THRR unchanged      Progression   Progression  Continue to progress workloads to maintain intensity without signs/symptoms of physical distress.      Resistance Training   Training Prescription  Yes    Weight  orange bands    Reps  10-15    Time  10 Minutes      NuStep   Level  3    SPM  80    Minutes  17      Arm Ergometer   Level  3    RPM  80    Minutes  17      Track   Laps  6.5    Minutes  17       Social History   Tobacco Use  Smoking Status Former Smoker  . Packs/day: 3.00  . Years: 30.00  . Pack years: 90.00  . Types: Cigarettes  . Last attempt to quit: 09/03/1989  . Years since quitting: 29.0  Smokeless Tobacco Never Used    Goals Met:  Proper associated with RPD/PD & O2 Sat Exercise tolerated well  Goals Unmet:  Not Applicable  Comments: Service time is from 1330 to 1510    Dr. Rush Farmer is Medical Director for Pulmonary Rehab at [facility].

## 2018-09-26 ENCOUNTER — Encounter (HOSPITAL_COMMUNITY)
Admission: RE | Admit: 2018-09-26 | Discharge: 2018-09-26 | Disposition: A | Discharge status: Home / Self Care | Payer: PPO | Source: Ambulatory Visit | Attending: Emergency Medicine | Admitting: Emergency Medicine

## 2018-09-26 DIAGNOSIS — J439 Emphysema, unspecified: Secondary | ICD-10-CM

## 2018-09-26 NOTE — Progress Notes (Signed)
Daily Session Note  Patient Details  Name: Charles Archer MRN: [id number] Date of Birth: Sep 15, 1936 Referring Provider:     Pulmonary Rehab Walk Test from 08/29/2018 in [place]  Referring Provider  Dr. Lamonte Sakai      Encounter Date: 09/26/2018  Check In: Session Check In - 09/26/18 1342      Check-In   Supervising physician immediately available to respond to emergencies  [facility] immediately available    Physician(s)  Dr. Lonny Prude    Location  [facility]    Staff Present  Rosebud Poles, RN, BSN;[name], RN, Bjorn Loser, MS, Exercise Physiologist;Takuya Lariccia Ysidro Evert, RN    Medication changes reported      No    Fall or balance concerns reported     No    Tobacco Cessation  No Change    Warm-up and Cool-down  Performed as group-led instruction    Resistance Training Performed  Yes    VAD Patient?  No    PAD/SET Patient?  No      Pain Assessment   Currently in Pain?  No/denies    Pain Score  0-No pain    Multiple Pain Sites  No       Capillary Blood Glucose: No results found for this or any previous visit (from the past 24 hour(s)).    Social History   Tobacco Use  Smoking Status Former Smoker  . Packs/day: 3.00  . Years: 30.00  . Pack years: 90.00  . Types: Cigarettes  . Last attempt to quit: 09/03/1989  . Years since quitting: 29.0  Smokeless Tobacco Never Used    Goals Met:  Exercise tolerated well No report of cardiac concerns or symptoms Strength training completed today  Goals Unmet:  Not Applicable  Comments: Service time is from 1330 to 1510    Dr. Rush Farmer is Medical Director for Pulmonary Rehab at [facility].

## 2018-09-26 NOTE — Progress Notes (Signed)
Pulmonary Individual Treatment Plan  Patient Details  Name: Charles Archer MRN: [id number] Date of Birth: April 10, 1936 Referring Provider:     Pulmonary Rehab Walk Test from 08/29/2018 in [place]  Referring Provider  Dr. Lamonte Sakai      Initial Encounter Date:    Pulmonary Rehab Walk Test from 08/29/2018 in [place]  Date  08/29/18      Visit Diagnosis: Pulmonary emphysema, unspecified emphysema type ([place])  Patient's Home Medications on Admission:   Current Outpatient Medications:  .  acetaminophen (TYLENOL ARTHRITIS PAIN) 650 MG CR tablet, Take 650 mg by mouth every 8 (eight) hours as needed for pain., Disp: , Rfl:  .  albuterol (PROAIR HFA) 108 (90 Base) MCG/ACT inhaler, Inhale 2 puffs into the lungs every 6 (six) hours as needed for wheezing or shortness of breath., Disp: 1 Inhaler, Rfl: 5 .  allopurinol (ZYLOPRIM) 100 MG tablet, Take 100 mg by mouth daily., Disp: , Rfl:  .  aspirin 81 MG tablet, Take 81 mg by mouth daily., Disp: , Rfl:  .  B Complex-Folic Acid (SUPER B COMPLEX MAXI PO), Take 1 tablet by mouth daily. , Disp: , Rfl:  .  calcium carbonate (OS-CAL) 600 MG TABS tablet, Take 600 mg by mouth daily., Disp: , Rfl:  .  Cinnamon 500 MG capsule, Take 1,000 mg by mouth daily., Disp: , Rfl:  .  docusate sodium (COLACE) 50 MG capsule, Take 50 mg by mouth daily as needed for mild constipation. , Disp: , Rfl:  .  fluorouracil (EFUDEX) 5 % cream, Apply 5 % topically 2 (two) times daily as needed. , Disp: , Rfl:  .  fluticasone (FLONASE) 50 MCG/ACT nasal spray, PLACE 2 SPRAYS INTO BOTH NOSTRILS DAILY., Disp: 16 g, Rfl: 5 .  Ginkgo Biloba 40 MG TABS, Take 40 mg by mouth daily. , Disp: , Rfl:  .  ipratropium-albuterol (DUONEB) 0.5-2.5 (3) MG/3ML SOLN, INHALE 1 VIAL ( 3 ML'S)  VIA NEBULIZATION BY MOUTH FOUR TIMES A DAY, Disp: 360 mL, Rfl: 1 .  irbesartan (AVAPRO) 300 MG tablet, Take 1 tablet by mouth daily., Disp: , Rfl:   .  Misc Natural Products (OSTEO BI-FLEX ADV JOINT SHIELD PO), Take 1,500 mg by mouth daily., Disp: , Rfl:  .  Misc. Devices (ACAPELLA) MISC, Use as directed, Disp: 1 each, Rfl: 0 .  Multiple Vitamin (MULTIVITAMIN) capsule, Take 1 capsule by mouth daily., Disp: , Rfl:  .  Naproxen Sodium (ALEVE PO), Take 220 mg by mouth as needed (for pain). , Disp: , Rfl:  .  niacin 250 MG tablet, Take 250 mg by mouth at bedtime., Disp: , Rfl:  .  sildenafil (REVATIO) 20 MG tablet, Take 1 tablet by mouth daily., Disp: , Rfl:  .  tamsulosin (FLOMAX) 0.4 MG CAPS capsule, Take 1 capsule by mouth daily., Disp: , Rfl:  .  vitamin B-12 (CYANOCOBALAMIN) 1000 MCG tablet, Take 1,000 mcg by mouth daily., Disp: , Rfl:  .  zinc gluconate 50 MG tablet, Take 50 mg by mouth daily., Disp: , Rfl:   Past Medical History: Past Medical History:  Diagnosis Date  . Abnormal stress test    s/p cardiac cath 10/2013 - nonobstructive CAD with normal LV function  . CKD (chronic kidney disease)   . COPD (chronic obstructive pulmonary disease) ([place])   . Dyspnea on exertion   . Emphysema lung ([place])   . GERD (gastroesophageal reflux disease)   . Gout   .  HTN (hypertension)   . Hyperlipidemia     Tobacco Use: Social History   Tobacco Use  Smoking Status Former Smoker  . Packs/day: 3.00  . Years: 30.00  . Pack years: 90.00  . Types: Cigarettes  . Last attempt to quit: 09/03/1989  . Years since quitting: 29.0  Smokeless Tobacco Never Used    Labs: Recent Review Flowsheet Data    There is no flowsheet data to display.      Capillary Blood Glucose: No results found for: GLUCAP   Pulmonary Assessment Scores: Pulmonary Assessment Scores    Row Name 08/29/18 1509 08/29/18 1630       ADL UCSD   ADL Phase  Entry  Entry    SOB Score total  89  -      CAT Score   CAT Score  pre 18  -      mMRC Score   mMRC Score  -  3       Pulmonary Function Assessment:   Exercise Target Goals: Exercise Program  Goal: Individual exercise prescription set using results from initial 6 min walk test and THRR while considering  patient's activity barriers and safety.   Exercise Prescription Goal: Initial exercise prescription builds to 30-45 minutes a day of aerobic activity, 2-3 days per week.  Home exercise guidelines will be given to patient during program as part of exercise prescription that the participant will acknowledge.  Activity Barriers & Risk Stratification: Activity Barriers & Cardiac Risk Stratification - 08/26/18 1405      Activity Barriers & Cardiac Risk Stratification   Activity Barriers  Deconditioning;Arthritis;Joint Problems;Shortness of Breath       6 Minute Walk: 6 Minute Walk    Row Name 08/29/18 1633         6 Minute Walk   Phase  Initial     Distance  615 feet     Walk Time  3.75 minutes     # of Rest Breaks  2     MPH  1.16     METS  1.92     RPE  13     Perceived Dyspnea   3     Symptoms  Yes (comment)     Comments  On 6 L pt desaturated to 84%. Following at 1.5 minute break the pt was bumped up to 8 L, desaturated, then bumped up to 10 L. At 10 L the pt desaturated again and took a seated rest break for the last 45 seconds. The pt was put on 15 L for the remainder of time and saturation levels rose back to acceptable levels.      Resting HR  104 bpm     Resting BP  106/82     Resting Oxygen Saturation   98 %     Exercise Oxygen Saturation  during 6 min walk  84 %     Max Ex. HR  127 bpm     Max Ex. BP  130/76     2 Minute Post BP  110/68       Interval HR   1 Minute HR  117     2 Minute HR  118     3 Minute HR  110     4 Minute HR  118     5 Minute HR  121     6 Minute HR  127     2 Minute Post HR  114     Interval Heart  Rate?  Yes       Interval Oxygen   Interval Oxygen?  Yes     Baseline Oxygen Saturation %  98 %     1 Minute Oxygen Saturation %  95 %     1 Minute Liters of Oxygen  6 L     2 Minute Oxygen Saturation %  84 %     2 Minute Liters  of Oxygen  6 L     3 Minute Oxygen Saturation %  86 %     3 Minute Liters of Oxygen  8 L     4 Minute Oxygen Saturation %  97 %     4 Minute Liters of Oxygen  10 L     5 Minute Oxygen Saturation %  90 %     5 Minute Liters of Oxygen  10 L     6 Minute Oxygen Saturation %  84 %     6 Minute Liters of Oxygen  10 L     2 Minute Post Oxygen Saturation %  99 %     2 Minute Post Liters of Oxygen  15 L        Oxygen Initial Assessment: Oxygen Initial Assessment - 08/26/18 1408      Home Oxygen   Home Oxygen Device  E-Tanks;Home Concentrator   [employer]   Sleep Oxygen Prescription  Continuous    Liters per minute  4    Home Exercise Oxygen Prescription  Continuous    Liters per minute  6    Home at Rest Exercise Oxygen Prescription  Continuous    Liters per minute  4    Compliance with Home Oxygen Use  Yes      Initial 6 min Walk   Oxygen Used  Continuous;E-Tanks      Program Oxygen Prescription   Program Oxygen Prescription  Continuous      Intervention   Short Term Goals  To learn and exhibit compliance with exercise, home and travel O2 prescription;To learn and understand importance of maintaining oxygen saturations>88%;To learn and demonstrate proper use of respiratory medications;To learn and understand importance of monitoring SPO2 with pulse oximeter and demonstrate accurate use of the pulse oximeter.;To learn and demonstrate proper pursed lip breathing techniques or other breathing techniques.    Long  Term Goals  Exhibits compliance with exercise, home and travel O2 prescription;Maintenance of O2 saturations>88%;Compliance with respiratory medication;Verbalizes importance of monitoring SPO2 with pulse oximeter and return demonstration;Exhibits proper breathing techniques, such as pursed lip breathing or other method taught during program session;Demonstrates proper use of MDI's       Oxygen Re-Evaluation: Oxygen Re-Evaluation    Row Name 09/25/18 0801              Program Oxygen Prescription   Program Oxygen Prescription  Continuous       Liters per minute  15       Comments  15 while walking, 10 during seated exercise         Home Oxygen   Home Oxygen Device  E-Tanks;Home Concentrator       Sleep Oxygen Prescription  Continuous       Liters per minute  6       Home Exercise Oxygen Prescription  Continuous       Liters per minute  15       Home at Rest Exercise Oxygen Prescription  Continuous       Liters per minute  6  Compliance with Home Oxygen Use  Yes         Goals/Expected Outcomes   Short Term Goals  To learn and exhibit compliance with exercise, home and travel O2 prescription;To learn and understand importance of maintaining oxygen saturations>88%;To learn and demonstrate proper use of respiratory medications;To learn and understand importance of monitoring SPO2 with pulse oximeter and demonstrate accurate use of the pulse oximeter.;To learn and demonstrate proper pursed lip breathing techniques or other breathing techniques.       Long  Term Goals  Exhibits compliance with exercise, home and travel O2 prescription;Maintenance of O2 saturations>88%;Compliance with respiratory medication;Verbalizes importance of monitoring SPO2 with pulse oximeter and return demonstration;Exhibits proper breathing techniques, such as pursed lip breathing or other method taught during program session;Demonstrates proper use of MDI's       Goals/Expected Outcomes  compliance           Oxygen Discharge (Final Oxygen Re-Evaluation): Oxygen Re-Evaluation - 09/25/18 0801      Program Oxygen Prescription   Program Oxygen Prescription  Continuous    Liters per minute  15    Comments  15 while walking, 10 during seated exercise      Home Oxygen   Home Oxygen Device  E-Tanks;Home Concentrator    Sleep Oxygen Prescription  Continuous    Liters per minute  6    Home Exercise Oxygen Prescription  Continuous    Liters per minute  15    Home at Rest  Exercise Oxygen Prescription  Continuous    Liters per minute  6    Compliance with Home Oxygen Use  Yes      Goals/Expected Outcomes   Short Term Goals  To learn and exhibit compliance with exercise, home and travel O2 prescription;To learn and understand importance of maintaining oxygen saturations>88%;To learn and demonstrate proper use of respiratory medications;To learn and understand importance of monitoring SPO2 with pulse oximeter and demonstrate accurate use of the pulse oximeter.;To learn and demonstrate proper pursed lip breathing techniques or other breathing techniques.    Long  Term Goals  Exhibits compliance with exercise, home and travel O2 prescription;Maintenance of O2 saturations>88%;Compliance with respiratory medication;Verbalizes importance of monitoring SPO2 with pulse oximeter and return demonstration;Exhibits proper breathing techniques, such as pursed lip breathing or other method taught during program session;Demonstrates proper use of MDI's    Goals/Expected Outcomes  compliance        Initial Exercise Prescription: Initial Exercise Prescription - 08/29/18 1600      Date of Initial Exercise RX and Referring Provider   Date  08/29/18    Referring Provider  Dr. Lamonte Sakai      Oxygen   Oxygen  Continuous    Liters  15      NuStep   Level  2    SPM  80    Minutes  17      Arm Ergometer   Level  1.5    RPM  80    Minutes  17      Track   Laps  7    Minutes  17      Prescription Details   Frequency (times per week)  2    Duration  Progress to 45 minutes of aerobic exercise without signs/symptoms of physical distress      Intensity   THRR 40-80% of Max Heartrate  56-112    Ratings of Perceived Exertion  11-13    Perceived Dyspnea  0-4  Progression   Progression  Continue to progress workloads to maintain intensity without signs/symptoms of physical distress.      Resistance Training   Training Prescription  Yes    Weight  orange bands    Reps   10-15       Perform Capillary Blood Glucose checks as needed.  Exercise Prescription Changes: Exercise Prescription Changes    Row Name 09/10/18 1600 09/24/18 1600           Response to Exercise   Blood Pressure (Admit)  100/60  112/64      Blood Pressure (Exercise)  124/60  120/70      Blood Pressure (Exit)  99/67  120/60      Heart Rate (Admit)  107 bpm  110 bpm      Heart Rate (Exercise)  121 bpm  111 bpm      Heart Rate (Exit)  99 bpm  103 bpm      Oxygen Saturation (Admit)  98 %  99 %      Oxygen Saturation (Exercise)  95 %  98 %      Oxygen Saturation (Exit)  99 %  98 %      Rating of Perceived Exertion (Exercise)  15  15      Perceived Dyspnea (Exercise)  3  3      Duration  Progress to 45 minutes of aerobic exercise without signs/symptoms of physical distress  Progress to 45 minutes of aerobic exercise without signs/symptoms of physical distress      Intensity  Other (comment) 40-80% of HRR  THRR unchanged        Progression   Progression  Continue to progress workloads to maintain intensity without signs/symptoms of physical distress.  Continue to progress workloads to maintain intensity without signs/symptoms of physical distress.        Resistance Training   Training Prescription  Yes  Yes      Weight  orange bands  orange bands      Reps  10-15  10-15      Time  10 Minutes  10 Minutes        NuStep   Level  2  3      SPM  80  80      Minutes  17  17        Arm Ergometer   Level  2  3      RPM  -  80      Minutes  17  17        Track   Laps  5  6.5      Minutes  17  17         Exercise Comments:   Exercise Goals and Review: Exercise Goals    Row Name 08/26/18 1406             Exercise Goals   Increase Physical Activity  Yes       Intervention  Provide advice, education, support and counseling about physical activity/exercise needs.;Develop an individualized exercise prescription for aerobic and resistive training based on initial evaluation  findings, risk stratification, comorbidities and participant's personal goals.       Expected Outcomes  Short Term: Attend rehab on a regular basis to increase amount of physical activity.;Long Term: Exercising regularly at least 3-5 days a week.;Long Term: Add in home exercise to make exercise part of routine and to increase amount of physical activity.  Increase Strength and Stamina  Yes       Intervention  Provide advice, education, support and counseling about physical activity/exercise needs.;Develop an individualized exercise prescription for aerobic and resistive training based on initial evaluation findings, risk stratification, comorbidities and participant's personal goals.       Expected Outcomes  Short Term: Increase workloads from initial exercise prescription for resistance, speed, and METs.;Short Term: Perform resistance training exercises routinely during rehab and add in resistance training at home;Long Term: Improve cardiorespiratory fitness, muscular endurance and strength as measured by increased METs and functional capacity (6MWT)       Able to understand and use rate of perceived exertion (RPE) scale  Yes       Intervention  Provide education and explanation on how to use RPE scale       Expected Outcomes  Short Term: Able to use RPE daily in rehab to express subjective intensity level;Long Term:  Able to use RPE to guide intensity level when exercising independently       Able to understand and use Dyspnea scale  Yes       Intervention  Provide education and explanation on how to use Dyspnea scale       Expected Outcomes  Short Term: Able to use Dyspnea scale daily in rehab to express subjective sense of shortness of breath during exertion;Long Term: Able to use Dyspnea scale to guide intensity level when exercising independently       Knowledge and understanding of Target Heart Rate Range (THRR)  Yes       Intervention  Provide education and explanation of THRR including how  the numbers were predicted and where they are located for reference       Expected Outcomes  Short Term: Able to state/look up THRR;Long Term: Able to use THRR to govern intensity when exercising independently;Short Term: Able to use daily as guideline for intensity in rehab       Understanding of Exercise Prescription  Yes       Intervention  Provide education, explanation, and written materials on patient's individual exercise prescription       Expected Outcomes  Short Term: Able to explain program exercise prescription;Long Term: Able to explain home exercise prescription to exercise independently          Exercise Goals Re-Evaluation : Exercise Goals Re-Evaluation    Row Name 09/25/18 0802             Exercise Goal Re-Evaluation   Exercise Goals Review  Increase Physical Activity;Increase Strength and Stamina;Able to understand and use rate of perceived exertion (RPE) scale;Able to understand and use Dyspnea scale;Knowledge and understanding of Target Heart Rate Range (THRR);Understanding of Exercise Prescription       Comments  Pt has completed 5 exercise sessions. He has been open to workload increases and is very motivated. Pt can walk 8 laps in 15 minutes around track (1 lap=200 ft). He exercises at low MET levels ~2.0. I expect progress to be slow. Will continue to monitor and progress as able.        Expected Outcomes  Through exercise at rehab and at home, patient will increase strength and stamina making ADL's easier to perform. Patient will also have a better understanding of safe exercise and what they are capable to do outside of clinical supervision.          Discharge Exercise Prescription (Final Exercise Prescription Changes): Exercise Prescription Changes - 09/24/18 1600  Response to Exercise   Blood Pressure (Admit)  112/64    Blood Pressure (Exercise)  120/70    Blood Pressure (Exit)  120/60    Heart Rate (Admit)  110 bpm    Heart Rate (Exercise)  111 bpm     Heart Rate (Exit)  103 bpm    Oxygen Saturation (Admit)  99 %    Oxygen Saturation (Exercise)  98 %    Oxygen Saturation (Exit)  98 %    Rating of Perceived Exertion (Exercise)  15    Perceived Dyspnea (Exercise)  3    Duration  Progress to 45 minutes of aerobic exercise without signs/symptoms of physical distress    Intensity  THRR unchanged      Progression   Progression  Continue to progress workloads to maintain intensity without signs/symptoms of physical distress.      Resistance Training   Training Prescription  Yes    Weight  orange bands    Reps  10-15    Time  10 Minutes      NuStep   Level  3    SPM  80    Minutes  17      Arm Ergometer   Level  3    RPM  80    Minutes  17      Track   Laps  6.5    Minutes  17       Nutrition:  Target Goals: Understanding of nutrition guidelines, daily intake of sodium <1555m, cholesterol <2034m calories 30% from fat and 7% or less from saturated fats, daily to have 5 or more servings of fruits and vegetables.  Biometrics:    Nutrition Therapy Plan and Nutrition Goals:   Nutrition Assessments:   Nutrition Goals Re-Evaluation:   Nutrition Goals Discharge (Final Nutrition Goals Re-Evaluation):   Psychosocial: Target Goals: Acknowledge presence or absence of significant depression and/or stress, maximize coping skills, provide positive support system. Participant is able to verbalize types and ability to use techniques and skills needed for reducing stress and depression.  Initial Review & Psychosocial Screening: Initial Psych Review & Screening - 08/26/18 1411      Initial Review   Current issues with  None Identified      Family Dynamics   Good Support System?  Yes      Barriers   Psychosocial barriers to participate in program  There are no identifiable barriers or psychosocial needs.;The patient should benefit from training in stress management and relaxation.      Screening Interventions    Interventions  Encouraged to exercise    Expected Outcomes  Long Term goal: The participant improves quality of Life and PHQ9 Scores as seen by post scores and/or verbalization of changes;Short Term goal: Identification and review with participant of any Quality of Life or Depression concerns found by scoring the questionnaire.       Quality of Life Scores:  Scores of 19 and below usually indicate a poorer quality of life in these areas.  A difference of  2-3 points is a clinically meaningful difference.  A difference of 2-3 points in the total score of the Quality of Life Index has been associated with significant improvement in overall quality of life, self-image, physical symptoms, and general health in studies assessing change in quality of life.  PHQ-9: Recent Review Flowsheet Data    [facility] 08/26/2018 05/28/2017   Decreased Interest 0 0   Down, Depressed, Hopeless 0 0  PHQ - 2 Score 0 0   Altered sleeping 0 0   Tired, decreased energy 2 0   Change in appetite 0 0   Feeling bad or failure about yourself  0 0   Trouble concentrating 0 0   Moving slowly or fidgety/restless 0 0   Suicidal thoughts 0 0   PHQ-9 Score 2 0   Difficult doing work/chores Not difficult at all Not difficult at all     Interpretation of Total Score  Total Score Depression Severity:  1-4 = Minimal depression, 5-9 = Mild depression, 10-14 = Moderate depression, 15-19 = Moderately severe depression, 20-27 = Severe depression   Psychosocial Evaluation and Intervention: Psychosocial Evaluation - 08/26/18 1412      Psychosocial Evaluation & Interventions   Interventions  Encouraged to exercise with the program and follow exercise prescription    Expected Outcomes  patient will remain free from psychosocial barriers to participation    Continue Psychosocial Services   Follow up required by staff       Psychosocial Re-Evaluation: Psychosocial Re-Evaluation    Iva Name 09/24/18 1007              Psychosocial Re-Evaluation   Current issues with  None Identified       Expected Outcomes  patient will remain free from psychosocial barriers to participation in pulmonary rehab        Interventions  Encouraged to attend Pulmonary Rehabilitation for the exercise       Continue Psychosocial Services   No Follow up required          Psychosocial Discharge (Final Psychosocial Re-Evaluation): Psychosocial Re-Evaluation - 09/24/18 1007      Psychosocial Re-Evaluation   Current issues with  None Identified    Expected Outcomes  patient will remain free from psychosocial barriers to participation in pulmonary rehab     Interventions  Encouraged to attend Pulmonary Rehabilitation for the exercise    Continue Psychosocial Services   No Follow up required       Education: Education Goals: Education classes will be provided on a weekly basis, covering required topics. Participant will state understanding/return demonstration of topics presented.  Learning Barriers/Preferences: Learning Barriers/Preferences - 08/26/18 1412      Learning Barriers/Preferences   Learning Barriers  None    Learning Preferences  Written Material;Individual Instruction;Group Instruction;Verbal Instruction       Education Topics: Risk Factor Reduction:  -Group instruction that is supported by a PowerPoint presentation. Instructor discusses the definition of a risk factor, different risk factors for pulmonary disease, and how the heart and lungs work together.     Nutrition for Pulmonary Patient:  -Group instruction provided by PowerPoint slides, verbal discussion, and written materials to support subject matter. The instructor gives an explanation and review of healthy diet recommendations, which includes a discussion on weight management, recommendations for fruit and vegetable consumption, as well as protein, fluid, caffeine, fiber, sodium, sugar, and alcohol. Tips for eating when patients are short  of breath are discussed.   PULMONARY REHAB OTHER RESPIRATORY from 10/25/2017 in [place]  Date  10/18/17  Educator  Nutritionist  Instruction Review Code (Retired)  2- meets goals/outcomes      Pursed Lip Breathing:  -Group instruction that is supported by demonstration and informational handouts. Instructor discusses the benefits of pursed lip and diaphragmatic breathing and detailed demonstration on how to preform both.     Oxygen Safety:  -Group instruction provided by PowerPoint,  verbal discussion, and written material to support subject matter. There is an overview of "What is Oxygen" and "Why do we need it".  Instructor also reviews how to create a safe environment for oxygen use, the importance of using oxygen as prescribed, and the risks of noncompliance. There is a brief discussion on traveling with oxygen and resources the patient may utilize.   Oxygen Equipment:  -Group instruction provided by [facility] Staff utilizing handouts, written materials, and equipment demonstrations.   PULMONARY REHAB OTHER RESPIRATORY from 10/25/2017 in [place]  Date  09/13/17  Educator  [facility]  Instruction Review Code (Retired)  2- meets goals/outcomes      Signs and Symptoms:  -Group instruction provided by written material and verbal discussion to support subject matter. Warning signs and symptoms of infection, stroke, and heart attack are reviewed and when to call the physician/911 reinforced. Tips for preventing the spread of infection discussed.   PULMONARY REHAB OTHER RESPIRATORY from 10/25/2017 in [place]  Date  07/05/17  Educator  RN  Instruction Review Code (Retired)  2- meets goals/outcomes      Advanced Directives:  -Group instruction provided by verbal instruction and written material to support subject matter. Instructor reviews Advanced Directive laws and proper  instruction for filling out document.   Pulmonary Video:  -Group video education that reviews the importance of medication and oxygen compliance, exercise, good nutrition, pulmonary hygiene, and pursed lip and diaphragmatic breathing for the pulmonary patient.   PULMONARY REHAB OTHER RESPIRATORY from 10/25/2017 in [place]  Date  07/12/17  Instruction Review Code (Retired)  2- meets goals/outcomes      Exercise for the Pulmonary Patient:  -Group instruction that is supported by a PowerPoint presentation. Instructor discusses benefits of exercise, core components of exercise, frequency, duration, and intensity of an exercise routine, importance of utilizing pulse oximetry during exercise, safety while exercising, and options of places to exercise outside of rehab.     PULMONARY REHAB OTHER RESPIRATORY from 10/25/2017 in [place]  Date  08/23/17  Educator  ep  Instruction Review Code (Retired)  2- meets goals/outcomes      Pulmonary Medications:  -Verbally interactive group education provided by Art therapist with focus on inhaled medications and proper administration.   PULMONARY REHAB OTHER RESPIRATORY from 09/17/2018 in [place]  Date  09/17/18  Educator  Anderson Malta  Instruction Review Code  1- Actuary and Physiology of the Respiratory System and Intimacy:  -Group instruction provided by PowerPoint, verbal discussion, and written material to support subject matter. Instructor reviews respiratory cycle and anatomical components of the respiratory system and their functions. Instructor also reviews differences in obstructive and restrictive respiratory diseases with examples of each. Intimacy, Sex, and Sexuality differences are reviewed with a discussion on how relationships can change when diagnosed with pulmonary disease. Common sexual concerns are  reviewed.   MD DAY -A group question and answer session with a medical doctor that allows participants to ask questions that relate to their pulmonary disease state.   PULMONARY REHAB OTHER RESPIRATORY from 10/25/2017 in [place]  Date  10/25/17  Educator  yacoub  Instruction Review Code (Retired)  R- Review/reinforce      OTHER EDUCATION -Group or individual verbal, written, or video instructions that support the educational goals of the pulmonary rehab program.  PULMONARY REHAB OTHER RESPIRATORY from 09/17/2018 in [place]  Date  09/05/18 [[facility]]  Educator  Carlette  Instruction Review Code  1- Verbalizes Understanding      Holiday Eating Survival Tips:  -Group instruction provided by [employer], verbal discussion, and written materials to support subject matter. The instructor gives patients tips, tricks, and techniques to help them not only survive but enjoy the holidays despite the onslaught of food that accompanies the holidays.   Knowledge Questionnaire Score: Knowledge Questionnaire Score - 08/29/18 1511      Knowledge Questionnaire Score   Pre Score  18/18       Core Components/Risk Factors/Patient Goals at Admission: Personal Goals and Risk Factors at Admission - 09/24/18 1007      Core Components/Risk Factors/Patient Goals on Admission   Intervention  Weight Management: Provide education and appropriate resources to help participant work on and attain dietary goals.    Admit Weight  184 lb 11.9 oz (83.8 kg)    Goal Weight: Long Term  184 lb 11.9 oz (83.8 kg)       Core Components/Risk Factors/Patient Goals Review:  Goals and Risk Factor Review    Row Name 09/24/18 1010             Core Components/Risk Factors/Patient Goals Review   Personal Goals Review  Improve shortness of breath with ADL's;Develop more efficient breathing techniques such as purse lipped breathing and  diaphragmatic breathing and practicing self-pacing with activity.;Increase knowledge of respiratory medications and ability to use respiratory devices properly.       Review  Just started program, has attended 4 exercise sessions, this is his second time in the program. Level 3 on nustep, level 2 on arm ergometer, 8 laps on track       Expected Outcomes  see admission outcomes          Core Components/Risk Factors/Patient Goals at Discharge (Final Review):  Goals and Risk Factor Review - 09/24/18 1010      Core Components/Risk Factors/Patient Goals Review   Personal Goals Review  Improve shortness of breath with ADL's;Develop more efficient breathing techniques such as purse lipped breathing and diaphragmatic breathing and practicing self-pacing with activity.;Increase knowledge of respiratory medications and ability to use respiratory devices properly.    Review  Just started program, has attended 4 exercise sessions, this is his second time in the program. Level 3 on nustep, level 2 on arm ergometer, 8 laps on track    Expected Outcomes  see admission outcomes       ITP Comments: ITP Comments    Row Name 08/26/18 1354           ITP Comments  Dr. Manfred Arch, Medical Director Pulmonary Rehab          Comments: ITP REVIEW Pt is making expected progress toward pulmonary rehab goals after completing 5 sessions. Recommend continued exercise, life style modification, education, and utilization of breathing techniques to increase stamina and strength and decrease shortness of breath with exertion.

## 2018-09-27 DIAGNOSIS — J449 Chronic obstructive pulmonary disease, unspecified: Secondary | ICD-10-CM | POA: Diagnosis not present

## 2018-10-01 ENCOUNTER — Encounter (HOSPITAL_COMMUNITY)
Admission: RE | Admit: 2018-10-01 | Discharge: 2018-10-01 | Disposition: A | Discharge status: Home / Self Care | Payer: PPO | Source: Ambulatory Visit | Attending: Emergency Medicine | Admitting: Emergency Medicine

## 2018-10-01 VITALS — Wt 181.9 lb

## 2018-10-01 DIAGNOSIS — J439 Emphysema, unspecified: Secondary | ICD-10-CM | POA: Diagnosis not present

## 2018-10-01 NOTE — Progress Notes (Signed)
Daily Session Note  Patient Details  Name: Charles Archer MRN: [id number] Date of Birth: 09/08/1936 Referring Provider:     Pulmonary Rehab Walk Test from 08/29/2018 in [facility]  Referring Provider  Dr. Byrum      Encounter Date: 10/01/2018  Check In: Session Check In - 10/01/18 1526      Check-In   Supervising physician immediately available to respond to emergencies  [facility] immediately available    Physician(s)  Dr. Hall    Location  [facility]    Staff Present  Lindsay Agnew RN, BSN;Joan Behrens, RN, BSN;Carlette Carlton, RN, BSN;Dalton Fletcher, MS, Exercise Physiologist;Lisa Hughes, RN    Medication changes reported      No    Fall or balance concerns reported     No    Tobacco Cessation  No Change    Warm-up and Cool-down  Performed as group-led instruction    Resistance Training Performed  Yes    VAD Patient?  No    PAD/SET Patient?  No      Pain Assessment   Currently in Pain?  No/denies    Pain Score  0-No pain    Multiple Pain Sites  No       Capillary Blood Glucose: No results found for this or any previous visit (from the past 24 hour(s)).    Social History   Tobacco Use  Smoking Status Former Smoker  . Packs/day: 3.00  . Years: 30.00  . Pack years: 90.00  . Types: Cigarettes  . Last attempt to quit: 09/03/1989  . Years since quitting: 29.0  Smokeless Tobacco Never Used    Goals Met:  Proper associated with RPD/PD & O2 Sat Exercise tolerated well Strength training completed today  Goals Unmet:  Not Applicable  Comments: Service time from 1330 to 1505.   Dr. Wesam G. Yacoub is Medical Director for Pulmonary Rehab at [facility]. 

## 2018-10-03 ENCOUNTER — Encounter (HOSPITAL_COMMUNITY): Payer: PPO

## 2018-10-03 ENCOUNTER — Other Ambulatory Visit: Payer: Self-pay | Admitting: Emergency Medicine

## 2018-10-03 ENCOUNTER — Telehealth (HOSPITAL_COMMUNITY): Payer: Self-pay | Admitting: Family Medicine

## 2018-10-08 ENCOUNTER — Encounter (HOSPITAL_COMMUNITY): Payer: PPO

## 2018-10-10 ENCOUNTER — Encounter (HOSPITAL_COMMUNITY): Payer: PPO

## 2018-10-15 ENCOUNTER — Encounter (HOSPITAL_COMMUNITY)
Admission: RE | Admit: 2018-10-15 | Discharge: 2018-10-15 | Disposition: A | Discharge status: Home / Self Care | Payer: PPO | Source: Ambulatory Visit | Attending: Emergency Medicine | Admitting: Emergency Medicine

## 2018-10-15 DIAGNOSIS — J439 Emphysema, unspecified: Secondary | ICD-10-CM | POA: Diagnosis not present

## 2018-10-15 NOTE — Progress Notes (Signed)
Charles Archer 82 y.o. male   DOB: [birth year] MRN: [id number]          Nutrition 1. Pulmonary emphysema, unspecified emphysema type (HCC)    Past Medical History:  Diagnosis Date  . Abnormal stress test    s/p cardiac cath 10/2013 - nonobstructive CAD with normal LV function  . CKD (chronic kidney disease)   . COPD (chronic obstructive pulmonary disease) (HCC)   . Dyspnea on exertion   . Emphysema lung (HCC)   . GERD (gastroesophageal reflux disease)   . Gout   . HTN (hypertension)   . Hyperlipidemia      Meds reviewed.   Current Outpatient Medications (Cardiovascular):  .  irbesartan (AVAPRO) 300 MG tablet, Take 1 tablet by mouth daily. .  sildenafil (REVATIO) 20 MG tablet, Take 1 tablet by mouth daily.  Current Outpatient Medications (Respiratory):  .  albuterol (PROAIR HFA) 108 (90 Base) MCG/ACT inhaler, Inhale 2 puffs into the lungs every 6 (six) hours as needed for wheezing or shortness of breath. .  fluticasone (FLONASE) 50 MCG/ACT nasal spray, PLACE 2 SPRAYS INTO BOTH NOSTRILS DAILY. Marland Kitchen.  ipratropium-albuterol (DUONEB) 0.5-2.5 (3) MG/3ML SOLN, INHALE 1 VIAL ( 3ML'S ) VIA NEBULIZER BY MOUTH  FOUR TIMES A DAY  Current Outpatient Medications (Analgesics):  .  acetaminophen (TYLENOL ARTHRITIS PAIN) 650 MG CR tablet, Take 650 mg by mouth every 8 (eight) hours as needed for pain. Marland Kitchen.  allopurinol (ZYLOPRIM) 100 MG tablet, Take 100 mg by mouth daily. Marland Kitchen.  aspirin 81 MG tablet, Take 81 mg by mouth daily. .  Naproxen Sodium (ALEVE PO), Take 220 mg by mouth as needed (for pain).   Current Outpatient Medications (Hematological):  .  vitamin B-12 (CYANOCOBALAMIN) 1000 MCG tablet, Take 1,000 mcg by mouth daily.  Current Outpatient Medications (Other):  Marland Kitchen.  B Complex-Folic Acid (SUPER B COMPLEX MAXI PO), Take 1 tablet by mouth daily.  .  calcium carbonate (OS-CAL) 600 MG TABS tablet, Take 600 mg by mouth daily. .  Cinnamon 500 MG capsule, Take 1,000 mg by mouth daily. Marland Kitchen.  docusate sodium  (COLACE) 50 MG capsule, Take 50 mg by mouth daily as needed for mild constipation.  .  fluorouracil (EFUDEX) 5 % cream, Apply 5 % topically 2 (two) times daily as needed.  .  Ginkgo Biloba 40 MG TABS, Take 40 mg by mouth daily.  .  Misc Natural Products (OSTEO BI-FLEX ADV JOINT SHIELD PO), Take 1,500 mg by mouth daily. .  Misc. Devices (ACAPELLA) MISC, Use as directed .  Multiple Vitamin (MULTIVITAMIN) capsule, Take 1 capsule by mouth daily. .  niacin 250 MG tablet, Take 250 mg by mouth at bedtime. .  tamsulosin (FLOMAX) 0.4 MG CAPS capsule, Take 1 capsule by mouth daily. Marland Kitchen.  zinc gluconate 50 MG tablet, Take 50 mg by mouth daily.  Ht: Ht Readings from Last 1 Encounters:  08/26/18 5' 11.5" (1.816 m)     Wt:  Wt Readings from Last 3 Encounters:  10/01/18 181 lb 14.1 oz (82.5 kg)  09/24/18 183 lb 10.3 oz (83.3 kg)  09/10/18 183 lb 3.2 oz (83.1 kg)     BMI: 25.01     Current tobacco use? No  Labs:  Lipid Panel  No results found for: CHOL, TRIG, HDL, CHOLHDL, VLDL, LDLCALC, LDLDIRECT  No results found for: HGBA1C Note Spoke with pt. Pt is within a normal weight for his age. Pt eats 3 meals a day; most prepared at home. Discussed eating smaller  meals/ snacks across the day. Pt was open to this idea and liked that he would potentially be able to not be as exhausted after eating. Pt would like to maintain weight, he is making healthy food choices the majority of the time. Reviewed meal prepping and planning with patient and distributed recipes pt and family can make. Pt's Rate Your Plate results reviewed with pt. Pt does avoid salty food; doesn't use canned/ convenience food.  Pt does not add salt to food.  The role of sodium in lung disease reviewed with pt. Pt expressed understanding of the information reviewed.  Nutrition Diagnosis ? Food-and nutrition-related knowledge deficit related to lack of exposure to information as related to diagnosis of pulmonary disease.  Nutrition  Intervention ? Pt's individual nutrition plan and goals reviewed with pt. ? Benefits of adopting healthy eating habits discussed when pt's Rate Your Plate reviewed.  Goal(s) 1. The pt will recognize symptoms that can interfere with adequate oral intake, such as shortness of breath, N/V, early satiety, fatigue, ability to secure and prepare food, taste and smell changes, chewing/swallowing difficulties, and/ or pain when eating. 2. The pt will have family and friends shop for food when necessary so that nourishing foods are always available at home. 3. Identify food quantities necessary to achieve wt maintenance at graduation from pulmonary rehab, and will consume 5-6 small meals across the day  Plan:  Pt to attend Pulmonary Nutrition class Will provide client-centered nutrition education as part of interdisciplinary care.    Monitor and Evaluate progress toward nutrition goal with team.   Charles MarcusAubrey Burklin, MS, RD, LDN 10/15/2018 3:23 PM

## 2018-10-15 NOTE — Progress Notes (Signed)
I have reviewed a Home Exercise Prescription with Charles Archer . Charles Archer is  currently exercising at home.  The patient was advised to walk 2 days a week for 30-45 minutes.  Charles Archer and I discussed how to progress their exercise prescription.  The patient stated that their goals were to play golf again and make ADL's feel easier.  The patient stated that they understand the exercise prescription.  We reviewed exercise guidelines, target heart rate during exercise, RPE Scale, weather conditions, NTG use, endpoints for exercise, warmup and cool down.  Patient is encouraged to come to me with any questions. I will continue to follow up with the patient to assist them with progression and safety.

## 2018-10-15 NOTE — Progress Notes (Signed)
Daily Session Note  Patient Details  Name: Charles Archer MRN: [id number] Date of Birth: Jul 29, 1936 Referring Provider:     Pulmonary Rehab Walk Test from 08/29/2018 in [place]  Referring Provider  Dr. Lamonte Sakai      Encounter Date: 10/15/2018  Check In: Session Check In - 10/15/18 1517      Check-In   Supervising physician immediately available to respond to emergencies  [facility] immediately available    Physician(s)  Dr. Nevada Crane    Location  [facility]    Staff Present  Joycelyn Man RN, BSN;Dalton Kris Mouton, MS, Exercise Physiologist;Lisa Ysidro Evert, RN;Orelia Brandstetter Leonia Reeves, RN, BSN    Medication changes reported      No    Fall or balance concerns reported     No    Tobacco Cessation  No Change    Warm-up and Cool-down  Performed as group-led instruction    Resistance Training Performed  Yes    VAD Patient?  No    PAD/SET Patient?  No      Pain Assessment   Currently in Pain?  No/denies    Pain Score  0-No pain    Multiple Pain Sites  No       Capillary Blood Glucose: No results found for this or any previous visit (from the past 24 hour(s)).  Exercise Prescription Changes - 10/15/18 1500      Home Exercise Plan   Plans to continue exercise at  [facility] (comment)   shopping centers   Frequency  Add 2 additional days to program exercise sessions.    Initial Home Exercises Provided  10/15/18       Social History   Tobacco Use  Smoking Status Former Smoker  . Packs/day: 3.00  . Years: 30.00  . Pack years: 90.00  . Types: Cigarettes  . Last attempt to quit: 09/03/1989  . Years since quitting: 29.1  Smokeless Tobacco Never Used    Goals Met:  Proper associated with RPD/PD & O2 Sat Exercise tolerated well Strength training completed today  Goals Unmet:  Not Applicable  Comments: Service time is from 1330 to 1500    Dr. Rush Farmer is Medical Director for Pulmonary Rehab at [facility]

## 2018-10-16 DIAGNOSIS — J449 Chronic obstructive pulmonary disease, unspecified: Secondary | ICD-10-CM | POA: Diagnosis not present

## 2018-10-17 ENCOUNTER — Encounter (HOSPITAL_COMMUNITY)
Admission: RE | Admit: 2018-10-17 | Discharge: 2018-10-17 | Disposition: A | Discharge status: Home / Self Care | Payer: PPO | Source: Ambulatory Visit | Attending: Emergency Medicine | Admitting: Emergency Medicine

## 2018-10-17 VITALS — Wt 180.1 lb

## 2018-10-17 DIAGNOSIS — J439 Emphysema, unspecified: Secondary | ICD-10-CM | POA: Diagnosis not present

## 2018-10-17 NOTE — Progress Notes (Signed)
Daily Session Note  Patient Details  Name: Charles Archer MRN: [id number] Date of Birth: 1935/12/04 Referring Provider:     Pulmonary Rehab Walk Test from 08/29/2018 in [place]  Referring Provider  Dr. Lamonte Sakai      Encounter Date: 10/17/2018  Check In: Session Check In - 10/17/18 1351      Check-In   Supervising physician immediately available to respond to emergencies  [facility] immediately available    Physician(s)  Dr. Nevada Crane    Location  [facility]    Staff Present  Rosebud Poles, RN, BSN;Lindsay Agnew RN, BSN;Dalton Kris Mouton, MS, Exercise Physiologist;Freemon Binford Ysidro Evert, RN    Medication changes reported      No    Fall or balance concerns reported     No    Tobacco Cessation  No Change    Warm-up and Cool-down  Performed as group-led instruction    Resistance Training Performed  Yes    VAD Patient?  No    PAD/SET Patient?  No      Pain Assessment   Currently in Pain?  No/denies    Pain Score  0-No pain    Multiple Pain Sites  No       Capillary Blood Glucose: No results found for this or any previous visit (from the past 24 hour(s)).    Social History   Tobacco Use  Smoking Status Former Smoker  . Packs/day: 3.00  . Years: 30.00  . Pack years: 90.00  . Types: Cigarettes  . Last attempt to quit: 09/03/1989  . Years since quitting: 29.1  Smokeless Tobacco Never Used    Goals Met:  Exercise tolerated well No report of cardiac concerns or symptoms Strength training completed today  Goals Unmet:  Not Applicable  Comments: Service time is from 1330 to 1510    Dr. Rush Farmer is Medical Director for Pulmonary Rehab at [facility].

## 2018-10-22 ENCOUNTER — Telehealth (HOSPITAL_COMMUNITY): Payer: Self-pay | Admitting: Family Medicine

## 2018-10-22 ENCOUNTER — Encounter (HOSPITAL_COMMUNITY)
Admission: RE | Admit: 2018-10-22 | Discharge: 2018-10-22 | Disposition: A | Discharge status: Home / Self Care | Payer: PPO | Source: Ambulatory Visit | Attending: Emergency Medicine | Admitting: Emergency Medicine

## 2018-10-23 DIAGNOSIS — J449 Chronic obstructive pulmonary disease, unspecified: Secondary | ICD-10-CM | POA: Diagnosis not present

## 2018-10-24 ENCOUNTER — Encounter (HOSPITAL_COMMUNITY)
Admission: RE | Admit: 2018-10-24 | Discharge: 2018-10-24 | Disposition: A | Discharge status: Home / Self Care | Payer: PPO | Source: Ambulatory Visit | Attending: Emergency Medicine | Admitting: Emergency Medicine

## 2018-10-24 ENCOUNTER — Telehealth (HOSPITAL_COMMUNITY): Payer: Self-pay | Admitting: Family Medicine

## 2018-10-24 DIAGNOSIS — J439 Emphysema, unspecified: Secondary | ICD-10-CM

## 2018-10-24 NOTE — Progress Notes (Signed)
Pulmonary Individual Treatment Plan  Patient Details  Name: Charles Archer MRN: [id number] Date of Birth: 12/12/1935 Referring Provider:     Pulmonary Rehab Walk Test from 08/29/2018 in [facility]  Referring Provider  Dr. Lamonte Sakai      Initial Encounter Date:    Pulmonary Rehab Walk Test from 08/29/2018 in [facility]  Date  08/29/18      Visit Diagnosis: Pulmonary emphysema, unspecified emphysema type ([place])  Patient's Home Medications on Admission:   Current Outpatient Medications:  .  acetaminophen (TYLENOL ARTHRITIS PAIN) 650 MG CR tablet, Take 650 mg by mouth every 8 (eight) hours as needed for pain., Disp: , Rfl:  .  albuterol (PROAIR HFA) 108 (90 Base) MCG/ACT inhaler, Inhale 2 puffs into the lungs every 6 (six) hours as needed for wheezing or shortness of breath., Disp: 1 Inhaler, Rfl: 5 .  allopurinol (ZYLOPRIM) 100 MG tablet, Take 100 mg by mouth daily., Disp: , Rfl:  .  aspirin 81 MG tablet, Take 81 mg by mouth daily., Disp: , Rfl:  .  B Complex-Folic Acid (SUPER B COMPLEX MAXI PO), Take 1 tablet by mouth daily. , Disp: , Rfl:  .  calcium carbonate (OS-CAL) 600 MG TABS tablet, Take 600 mg by mouth daily., Disp: , Rfl:  .  Cinnamon 500 MG capsule, Take 1,000 mg by mouth daily., Disp: , Rfl:  .  docusate sodium (COLACE) 50 MG capsule, Take 50 mg by mouth daily as needed for mild constipation. , Disp: , Rfl:  .  fluorouracil (EFUDEX) 5 % cream, Apply 5 % topically 2 (two) times daily as needed. , Disp: , Rfl:  .  fluticasone (FLONASE) 50 MCG/ACT nasal spray, PLACE 2 SPRAYS INTO BOTH NOSTRILS DAILY., Disp: 16 g, Rfl: 5 .  Ginkgo Biloba 40 MG TABS, Take 40 mg by mouth daily. , Disp: , Rfl:  .  ipratropium-albuterol (DUONEB) 0.5-2.5 (3) MG/3ML SOLN, INHALE 1 VIAL ( 3ML'S ) VIA NEBULIZER BY MOUTH  FOUR TIMES A DAY, Disp: 360 mL, Rfl: 2 .  irbesartan (AVAPRO) 300 MG tablet, Take 1 tablet by mouth daily., Disp: , Rfl:  .   Misc Natural Products (OSTEO BI-FLEX ADV JOINT SHIELD PO), Take 1,500 mg by mouth daily., Disp: , Rfl:  .  Misc. Devices (ACAPELLA) MISC, Use as directed, Disp: 1 each, Rfl: 0 .  Multiple Vitamin (MULTIVITAMIN) capsule, Take 1 capsule by mouth daily., Disp: , Rfl:  .  Naproxen Sodium (ALEVE PO), Take 220 mg by mouth as needed (for pain). , Disp: , Rfl:  .  niacin 250 MG tablet, Take 250 mg by mouth at bedtime., Disp: , Rfl:  .  sildenafil (REVATIO) 20 MG tablet, Take 1 tablet by mouth daily., Disp: , Rfl:  .  tamsulosin (FLOMAX) 0.4 MG CAPS capsule, Take 1 capsule by mouth daily., Disp: , Rfl:  .  vitamin B-12 (CYANOCOBALAMIN) 1000 MCG tablet, Take 1,000 mcg by mouth daily., Disp: , Rfl:  .  zinc gluconate 50 MG tablet, Take 50 mg by mouth daily., Disp: , Rfl:   Past Medical History: Past Medical History:  Diagnosis Date  . Abnormal stress test    s/p cardiac cath 10/2013 - nonobstructive CAD with normal LV function  . CKD (chronic kidney disease)   . COPD (chronic obstructive pulmonary disease) ([place])   . Dyspnea on exertion   . Emphysema lung ([place])   . GERD (gastroesophageal reflux disease)   . Gout   .  HTN (hypertension)   . Hyperlipidemia     Tobacco Use: Social History   Tobacco Use  Smoking Status Former Smoker  . Packs/day: 3.00  . Years: 30.00  . Pack years: 90.00  . Types: Cigarettes  . Last attempt to quit: 09/03/1989  . Years since quitting: 29.1  Smokeless Tobacco Never Used    Labs: Recent Review Flowsheet Data    There is no flowsheet data to display.      Capillary Blood Glucose: No results found for: GLUCAP   Pulmonary Assessment Scores: Pulmonary Assessment Scores    Row Name 08/29/18 1509 08/29/18 1630       ADL UCSD   ADL Phase  Entry  Entry    SOB Score total  89  -      CAT Score   CAT Score  pre 18  -      mMRC Score   mMRC Score  -  3       Pulmonary Function Assessment:   Exercise Target Goals: Exercise Program  Goal: Individual exercise prescription set using results from initial 6 min walk test and THRR while considering  patient's activity barriers and safety.   Exercise Prescription Goal: Initial exercise prescription builds to 30-45 minutes a day of aerobic activity, 2-3 days per week.  Home exercise guidelines will be given to patient during program as part of exercise prescription that the participant will acknowledge.  Activity Barriers & Risk Stratification: Activity Barriers & Cardiac Risk Stratification - 08/26/18 1405      Activity Barriers & Cardiac Risk Stratification   Activity Barriers  Deconditioning;Arthritis;Joint Problems;Shortness of Breath       6 Minute Walk: 6 Minute Walk    Row Name 08/29/18 1633         6 Minute Walk   Phase  Initial     Distance  615 feet     Walk Time  3.75 minutes     # of Rest Breaks  2     MPH  1.16     METS  1.92     RPE  13     Perceived Dyspnea   3     Symptoms  Yes (comment)     Comments  On 6 L pt desaturated to 84%. Following at 1.5 minute break the pt was bumped up to 8 L, desaturated, then bumped up to 10 L. At 10 L the pt desaturated again and took a seated rest break for the last 45 seconds. The pt was put on 15 L for the remainder of time and saturation levels rose back to acceptable levels.      Resting HR  104 bpm     Resting BP  106/82     Resting Oxygen Saturation   98 %     Exercise Oxygen Saturation  during 6 min walk  84 %     Max Ex. HR  127 bpm     Max Ex. BP  130/76     2 Minute Post BP  110/68       Interval HR   1 Minute HR  117     2 Minute HR  118     3 Minute HR  110     4 Minute HR  118     5 Minute HR  121     6 Minute HR  127     2 Minute Post HR  114     Interval Heart  Rate?  Yes       Interval Oxygen   Interval Oxygen?  Yes     Baseline Oxygen Saturation %  98 %     1 Minute Oxygen Saturation %  95 %     1 Minute Liters of Oxygen  6 L     2 Minute Oxygen Saturation %  84 %     2 Minute Liters  of Oxygen  6 L     3 Minute Oxygen Saturation %  86 %     3 Minute Liters of Oxygen  8 L     4 Minute Oxygen Saturation %  97 %     4 Minute Liters of Oxygen  10 L     5 Minute Oxygen Saturation %  90 %     5 Minute Liters of Oxygen  10 L     6 Minute Oxygen Saturation %  84 %     6 Minute Liters of Oxygen  10 L     2 Minute Post Oxygen Saturation %  99 %     2 Minute Post Liters of Oxygen  15 L        Oxygen Initial Assessment: Oxygen Initial Assessment - 08/26/18 1408      Home Oxygen   Home Oxygen Device  E-Tanks;Home Concentrator   [employer]   Sleep Oxygen Prescription  Continuous    Liters per minute  4    Home Exercise Oxygen Prescription  Continuous    Liters per minute  6    Home at Rest Exercise Oxygen Prescription  Continuous    Liters per minute  4    Compliance with Home Oxygen Use  Yes      Initial 6 min Walk   Oxygen Used  Continuous;E-Tanks      Program Oxygen Prescription   Program Oxygen Prescription  Continuous      Intervention   Short Term Goals  To learn and exhibit compliance with exercise, home and travel O2 prescription;To learn and understand importance of maintaining oxygen saturations>88%;To learn and demonstrate proper use of respiratory medications;To learn and understand importance of monitoring SPO2 with pulse oximeter and demonstrate accurate use of the pulse oximeter.;To learn and demonstrate proper pursed lip breathing techniques or other breathing techniques.    Long  Term Goals  Exhibits compliance with exercise, home and travel O2 prescription;Maintenance of O2 saturations>88%;Compliance with respiratory medication;Verbalizes importance of monitoring SPO2 with pulse oximeter and return demonstration;Exhibits proper breathing techniques, such as pursed lip breathing or other method taught during program session;Demonstrates proper use of MDI's       Oxygen Re-Evaluation: Oxygen Re-Evaluation    Row Name 09/25/18 0801 10/22/18 0855            Program Oxygen Prescription   Program Oxygen Prescription  Continuous  Continuous      Liters per minute  15  15      Comments  15 while walking, 10 during seated exercise  15 while walking, 8 during seated exercise        Home Oxygen   Home Oxygen Device  E-Tanks;Home Concentrator  E-Tanks;Home Concentrator      Sleep Oxygen Prescription  Continuous  Continuous      Liters per minute  6  6      Home Exercise Oxygen Prescription  Continuous  Continuous      Liters per minute  15  15      Home at Rest Exercise  Oxygen Prescription  Continuous  Continuous      Liters per minute  6  6      Compliance with Home Oxygen Use  Yes  Yes        Goals/Expected Outcomes   Short Term Goals  To learn and exhibit compliance with exercise, home and travel O2 prescription;To learn and understand importance of maintaining oxygen saturations>88%;To learn and demonstrate proper use of respiratory medications;To learn and understand importance of monitoring SPO2 with pulse oximeter and demonstrate accurate use of the pulse oximeter.;To learn and demonstrate proper pursed lip breathing techniques or other breathing techniques.  To learn and exhibit compliance with exercise, home and travel O2 prescription;To learn and understand importance of maintaining oxygen saturations>88%;To learn and demonstrate proper use of respiratory medications;To learn and understand importance of monitoring SPO2 with pulse oximeter and demonstrate accurate use of the pulse oximeter.;To learn and demonstrate proper pursed lip breathing techniques or other breathing techniques.      Long  Term Goals  Exhibits compliance with exercise, home and travel O2 prescription;Maintenance of O2 saturations>88%;Compliance with respiratory medication;Verbalizes importance of monitoring SPO2 with pulse oximeter and return demonstration;Exhibits proper breathing techniques, such as pursed lip breathing or other method taught during program  session;Demonstrates proper use of MDI's  Exhibits compliance with exercise, home and travel O2 prescription;Maintenance of O2 saturations>88%;Compliance with respiratory medication;Verbalizes importance of monitoring SPO2 with pulse oximeter and return demonstration;Exhibits proper breathing techniques, such as pursed lip breathing or other method taught during program session;Demonstrates proper use of MDI's      Goals/Expected Outcomes  compliance   compliance          Oxygen Discharge (Final Oxygen Re-Evaluation): Oxygen Re-Evaluation - 10/22/18 0855      Program Oxygen Prescription   Program Oxygen Prescription  Continuous    Liters per minute  15    Comments  15 while walking, 8 during seated exercise      Home Oxygen   Home Oxygen Device  E-Tanks;Home Concentrator    Sleep Oxygen Prescription  Continuous    Liters per minute  6    Home Exercise Oxygen Prescription  Continuous    Liters per minute  15    Home at Rest Exercise Oxygen Prescription  Continuous    Liters per minute  6    Compliance with Home Oxygen Use  Yes      Goals/Expected Outcomes   Short Term Goals  To learn and exhibit compliance with exercise, home and travel O2 prescription;To learn and understand importance of maintaining oxygen saturations>88%;To learn and demonstrate proper use of respiratory medications;To learn and understand importance of monitoring SPO2 with pulse oximeter and demonstrate accurate use of the pulse oximeter.;To learn and demonstrate proper pursed lip breathing techniques or other breathing techniques.    Long  Term Goals  Exhibits compliance with exercise, home and travel O2 prescription;Maintenance of O2 saturations>88%;Compliance with respiratory medication;Verbalizes importance of monitoring SPO2 with pulse oximeter and return demonstration;Exhibits proper breathing techniques, such as pursed lip breathing or other method taught during program session;Demonstrates proper use of MDI's     Goals/Expected Outcomes  compliance        Initial Exercise Prescription: Initial Exercise Prescription - 08/29/18 1600      Date of Initial Exercise RX and Referring Provider   Date  08/29/18    Referring Provider  Dr. Lamonte Sakai      Oxygen   Oxygen  Continuous    Liters  15  NuStep   Level  2    SPM  80    Minutes  17      Arm Ergometer   Level  1.5    RPM  80    Minutes  17      Track   Laps  7    Minutes  17      Prescription Details   Frequency (times per week)  2    Duration  Progress to 45 minutes of aerobic exercise without signs/symptoms of physical distress      Intensity   THRR 40-80% of Max Heartrate  56-112    Ratings of Perceived Exertion  11-13    Perceived Dyspnea  0-4      Progression   Progression  Continue to progress workloads to maintain intensity without signs/symptoms of physical distress.      Resistance Training   Training Prescription  Yes    Weight  orange bands    Reps  10-15       Perform Capillary Blood Glucose checks as needed.  Exercise Prescription Changes: Exercise Prescription Changes    Row Name 09/10/18 1600 09/24/18 1600 10/01/18 1254 10/15/18 1500 10/17/18 1544     Response to Exercise   Blood Pressure (Admit)  100/60  112/64  104/66  -  122/54   Blood Pressure (Exercise)  124/60  120/70  116/52  -  118/60   Blood Pressure (Exit)  99/67  120/60  98/60  -  120/60   Heart Rate (Admit)  107 bpm  110 bpm  98 bpm  -  96 bpm   Heart Rate (Exercise)  121 bpm  111 bpm  108 bpm  -  101 bpm   Heart Rate (Exit)  99 bpm  103 bpm  93 bpm  -  90 bpm   Oxygen Saturation (Admit)  98 %  99 %  100 %  -  99 %   Oxygen Saturation (Exercise)  95 %  98 %  96 %  -  96 %   Oxygen Saturation (Exit)  99 %  98 %  99 %  -  100 %   Rating of Perceived Exertion (Exercise)  _0 -  13   Perceived Dyspnea (Exercise)  _1 -  1   Duration  Progress to 45 minutes of aerobic exercise without signs/symptoms of physical distress   Progress to 45 minutes of aerobic exercise without signs/symptoms of physical distress  Progress to 45 minutes of aerobic exercise without signs/symptoms of physical distress  -  Progress to 45 minutes of aerobic exercise without signs/symptoms of physical distress   Intensity  Other (comment) 40-80% of HRR  THRR unchanged  THRR unchanged  -  THRR unchanged     Progression   Progression  Continue to progress workloads to maintain intensity without signs/symptoms of physical distress.  Continue to progress workloads to maintain intensity without signs/symptoms of physical distress.  Continue to progress workloads to maintain intensity without signs/symptoms of physical distress.  -  Continue to progress workloads to maintain intensity without signs/symptoms of physical distress.     Resistance Training   Training Prescription  Yes  Yes  Yes  -  Yes   Weight  orange bands  orange bands  orange bands  -  orange bands   Reps  10-15  10-15  10-15  -  10-15   Time  10 Minutes  10 Minutes  10 Minutes  -  10 Minutes     Oxygen   Oxygen  -  -  Continuous  -  Continuous   Liters  -  -  15  -  15     NuStep   Level  _0 -  4   SPM  80  80  80  -  80   Minutes  _1 -  17   METs  -  -  1.9  -  1.9     Arm Ergometer   Level  _2 -  3.5   RPM  -  80  80  -  80   Minutes  _3 -  17     Track   Laps  5  6.5  6  -  -   Minutes  _4 -  -     Home Exercise Plan   Plans to continue exercise at  -  -  [employer] (comment[place] shopping centers  -   Frequency  -  -  -  Add 2 additional days to program exercise sessions.  -   Initial Home Exercises Provided  -  -  -  10/15/18  -      Exercise Comments: Exercise Comments    Row Name 10/15/18 1529           Exercise Comments  home exercise complete          Exercise Goals and Review: Exercise Goals    Row Name 08/26/18 1406             Exercise Goals   Increase Physical Activity  Yes        Intervention  Provide advice, education, support and counseling about physical activity/exercise needs.;Develop an individualized exercise prescription for aerobic and resistive training based on initial evaluation findings, risk stratification, comorbidities and participant's personal goals.       Expected Outcomes  Short Term: Attend rehab on a regular basis to increase amount of physical activity.;Long Term: Exercising regularly at least 3-5 days a week.;Long Term: Add in home exercise to make exercise part of routine and to increase amount of physical activity.       Increase Strength and Stamina  Yes       Intervention  Provide advice, education, support and counseling about physical activity/exercise needs.;Develop an individualized exercise prescription for aerobic and resistive training based on initial evaluation findings, risk stratification, comorbidities and participant's personal goals.       Expected Outcomes  Short Term: Increase workloads from initial exercise prescription for resistance, speed, and METs.;Short Term: Perform resistance training exercises routinely during rehab and add in resistance training at home;Long Term: Improve cardiorespiratory fitness, muscular endurance and strength as measured by increased METs and functional capacity (6MWT)       Able to understand and use rate of perceived exertion (RPE) scale  Yes       Intervention  Provide education and explanation on how to use RPE scale       Expected Outcomes  Short Term: Able to use RPE daily in rehab to express subjective intensity level;Long Term:  Able to use RPE to guide intensity level when exercising independently       Able to understand and use Dyspnea scale  Yes  Intervention  Provide education and explanation on how to use Dyspnea scale       Expected Outcomes  Short Term: Able to use Dyspnea scale daily in rehab to express subjective sense of shortness of breath during exertion;Long Term: Able to use  Dyspnea scale to guide intensity level when exercising independently       Knowledge and understanding of Target Heart Rate Range (THRR)  Yes       Intervention  Provide education and explanation of THRR including how the numbers were predicted and where they are located for reference       Expected Outcomes  Short Term: Able to state/look up THRR;Long Term: Able to use THRR to govern intensity when exercising independently;Short Term: Able to use daily as guideline for intensity in rehab       Understanding of Exercise Prescription  Yes       Intervention  Provide education, explanation, and written materials on patient's individual exercise prescription       Expected Outcomes  Short Term: Able to explain program exercise prescription;Long Term: Able to explain home exercise prescription to exercise independently          Exercise Goals Re-Evaluation : Exercise Goals Re-Evaluation    Row Name 09/25/18 0802 10/22/18 0856           Exercise Goal Re-Evaluation   Exercise Goals Review  Increase Physical Activity;Increase Strength and Stamina;Able to understand and use rate of perceived exertion (RPE) scale;Able to understand and use Dyspnea scale;Knowledge and understanding of Target Heart Rate Range (THRR);Understanding of Exercise Prescription  Increase Physical Activity;Increase Strength and Stamina;Able to understand and use rate of perceived exertion (RPE) scale;Able to understand and use Dyspnea scale;Knowledge and understanding of Target Heart Rate Range (THRR);Understanding of Exercise Prescription      Comments  Pt has completed 5 exercise sessions. He has been open to workload increases and is very motivated. Pt can walk 8 laps in 15 minutes around track (1 lap=200 ft). He exercises at low MET levels ~2.0. I expect progress to be slow. Will continue to monitor and progress as able.   Pt is progressing well. Pt has recently been hindered by a hurt knee. Pt continues to exercise at low MET  levels (2.0). Will continue to monitor and progress as able.       Expected Outcomes  Through exercise at rehab and at home, patient will increase strength and stamina making ADL's easier to perform. Patient will also have a better understanding of safe exercise and what they are capable to do outside of clinical supervision.  -         Discharge Exercise Prescription (Final Exercise Prescription Changes): Exercise Prescription Changes - 10/17/18 1544      Response to Exercise   Blood Pressure (Admit)  122/54    Blood Pressure (Exercise)  118/60    Blood Pressure (Exit)  120/60    Heart Rate (Admit)  96 bpm    Heart Rate (Exercise)  101 bpm    Heart Rate (Exit)  90 bpm    Oxygen Saturation (Admit)  99 %    Oxygen Saturation (Exercise)  96 %    Oxygen Saturation (Exit)  100 %    Rating of Perceived Exertion (Exercise)  13    Perceived Dyspnea (Exercise)  1    Duration  Progress to 45 minutes of aerobic exercise without signs/symptoms of physical distress    Intensity  THRR unchanged  Progression   Progression  Continue to progress workloads to maintain intensity without signs/symptoms of physical distress.      Resistance Training   Training Prescription  Yes    Weight  orange bands    Reps  10-15    Time  10 Minutes      Oxygen   Oxygen  Continuous    Liters  15      NuStep   Level  4    SPM  80    Minutes  17    METs  1.9      Arm Ergometer   Level  3.5    RPM  80    Minutes  17       Nutrition:  Target Goals: Understanding of nutrition guidelines, daily intake of sodium <1577m, cholesterol <2068m calories 30% from fat and 7% or less from saturated fats, daily to have 5 or more servings of fruits and vegetables.  Biometrics:    Nutrition Therapy Plan and Nutrition Goals: Nutrition Therapy & Goals - 10/15/18 1523      Nutrition Therapy   Diet  Heart Healthy      Personal Nutrition Goals   Nutrition Goal  Describe the benefit of including fruits,  vegetables, whole grains, and low-fat dairy products in a healthy meal plan.    Personal Goal #2  eat reguarly across the day, 5-6 small meals/snacks    Personal Goal #3  The pt will recognize symptoms that can interfere with adequate oral intake      Intervention Plan   Intervention  Prescribe, educate and counsel regarding individualized specific dietary modifications aiming towards targeted core components such as weight, hypertension, lipid management, diabetes, heart failure and other comorbidities.    Expected Outcomes  Short Term Goal: Understand basic principles of dietary content, such as calories, fat, sodium, cholesterol and nutrients.;Long Term Goal: Adherence to prescribed nutrition plan.       Nutrition Assessments: Nutrition Assessments - 10/15/18 1524      Rate Your Plate Scores   Pre Score  45       Nutrition Goals Re-Evaluation:   Nutrition Goals Discharge (Final Nutrition Goals Re-Evaluation):   Psychosocial: Target Goals: Acknowledge presence or absence of significant depression and/or stress, maximize coping skills, provide positive support system. Participant is able to verbalize types and ability to use techniques and skills needed for reducing stress and depression.  Initial Review & Psychosocial Screening: Initial Psych Review & Screening - 08/26/18 1411      Initial Review   Current issues with  None Identified      Family Dynamics   Good Support System?  Yes      Barriers   Psychosocial barriers to participate in program  There are no identifiable barriers or psychosocial needs.;The patient should benefit from training in stress management and relaxation.      Screening Interventions   Interventions  Encouraged to exercise    Expected Outcomes  Long Term goal: The participant improves quality of Life and PHQ9 Scores as seen by post scores and/or verbalization of changes;Short Term goal: Identification and review with participant of any Quality of  Life or Depression concerns found by scoring the questionnaire.       Quality of Life Scores:  Scores of 19 and below usually indicate a poorer quality of life in these areas.  A difference of  2-3 points is a clinically meaningful difference.  A difference of 2-3 points in the total score of  the Quality of Life Index has been associated with significant improvement in overall quality of life, self-image, physical symptoms, and general health in studies assessing change in quality of life.  PHQ-9: Recent Review Flowsheet Data    Depression screen [facility] 2/9 08/26/2018 05/28/2017   Decreased Interest 0 0   Down, Depressed, Hopeless 0 0   PHQ - 2 Score 0 0   Altered sleeping 0 0   Tired, decreased energy 2 0   Change in appetite 0 0   Feeling bad or failure about yourself  0 0   Trouble concentrating 0 0   Moving slowly or fidgety/restless 0 0   Suicidal thoughts 0 0   PHQ-9 Score 2 0   Difficult doing work/chores Not difficult at all Not difficult at all     Interpretation of Total Score  Total Score Depression Severity:  1-4 = Minimal depression, 5-9 = Mild depression, 10-14 = Moderate depression, 15-19 = Moderately severe depression, 20-27 = Severe depression   Psychosocial Evaluation and Intervention: Psychosocial Evaluation - 10/22/18 0951      Psychosocial Evaluation & Interventions   Interventions  Encouraged to exercise with the program and follow exercise prescription    Comments  No psychosocial barriers have been identified at this time    Expected Outcomes  patient will remain free from psychosocial barriers to participation    Continue Psychosocial Services   No Follow up required       Psychosocial Re-Evaluation: Psychosocial Re-Evaluation    Row Name 09/24/18 1007 10/22/18 1517           Psychosocial Re-Evaluation   Current issues with  None Identified  None Identified      Comments  -  no psychosocial barriers have been identied at this time      Expected  Outcomes  patient will remain free from psychosocial barriers to participation in pulmonary rehab   patient will remain free from psychosocial barriers to participation in pulmonary rehab       Interventions  Encouraged to attend Pulmonary Rehabilitation for the exercise  Encouraged to attend Pulmonary Rehabilitation for the exercise      Continue Psychosocial Services   No Follow up required  No Follow up required         Psychosocial Discharge (Final Psychosocial Re-Evaluation): Psychosocial Re-Evaluation - 10/22/18 6160      Psychosocial Re-Evaluation   Current issues with  None Identified    Comments  no psychosocial barriers have been identied at this time    Expected Outcomes  patient will remain free from psychosocial barriers to participation in pulmonary rehab     Interventions  Encouraged to attend Pulmonary Rehabilitation for the exercise    Continue Psychosocial Services   No Follow up required       Education: Education Goals: Education classes will be provided on a weekly basis, covering required topics. Participant will state understanding/return demonstration of topics presented.  Learning Barriers/Preferences: Learning Barriers/Preferences - 08/26/18 1412      Learning Barriers/Preferences   Learning Barriers  None    Learning Preferences  Written Material;Individual Instruction;Group Instruction;Verbal Instruction       Education Topics: Risk Factor Reduction:  -Group instruction that is supported by a PowerPoint presentation. Instructor discusses the definition of a risk factor, different risk factors for pulmonary disease, and how the heart and lungs work together.     Nutrition for Pulmonary Patient:  -Group instruction provided by PowerPoint slides, verbal discussion, and written materials  to support subject matter. The instructor gives an explanation and review of healthy diet recommendations, which includes a discussion on weight management, recommendations  for fruit and vegetable consumption, as well as protein, fluid, caffeine, fiber, sodium, sugar, and alcohol. Tips for eating when patients are short of breath are discussed.   PULMONARY REHAB OTHER RESPIRATORY from 10/25/2017 in [place]  Date  10/18/17  Educator  Nutritionist  Instruction Review Code (Retired)  2- meets goals/outcomes      Pursed Lip Breathing:  -Group instruction that is supported by demonstration and informational handouts. Instructor discusses the benefits of pursed lip and diaphragmatic breathing and detailed demonstration on how to preform both.     Oxygen Safety:  -Group instruction provided by PowerPoint, verbal discussion, and written material to support subject matter. There is an overview of "What is Oxygen" and "Why do we need it".  Instructor also reviews how to create a safe environment for oxygen use, the importance of using oxygen as prescribed, and the risks of noncompliance. There is a brief discussion on traveling with oxygen and resources the patient may utilize.   Oxygen Equipment:  -Group instruction provided by [facility] Staff utilizing handouts, written materials, and equipment demonstrations.   PULMONARY REHAB OTHER RESPIRATORY from 10/25/2017 in [place]  Date  09/13/17  Educator  [employer]  Instruction Review Code (Retired)  2- meets goals/outcomes      Signs and Symptoms:  -Group instruction provided by written material and verbal discussion to support subject matter. Warning signs and symptoms of infection, stroke, and heart attack are reviewed and when to call the physician/911 reinforced. Tips for preventing the spread of infection discussed.   PULMONARY REHAB OTHER RESPIRATORY from 10/17/2018 in [place]  Date  10/17/18  Educator   Remo Lipps  Instruction Review Code  1- Verbalizes Understanding      Advanced Directives:  -Group  instruction provided by verbal instruction and written material to support subject matter. Instructor reviews Advanced Directive laws and proper instruction for filling out document.   Pulmonary Video:  -Group video education that reviews the importance of medication and oxygen compliance, exercise, good nutrition, pulmonary hygiene, and pursed lip and diaphragmatic breathing for the pulmonary patient.   PULMONARY REHAB OTHER RESPIRATORY from 10/25/2017 in [place]  Date  07/12/17  Instruction Review Code (Retired)  2- meets goals/outcomes      Exercise for the Pulmonary Patient:  -Group instruction that is supported by a PowerPoint presentation. Instructor discusses benefits of exercise, core components of exercise, frequency, duration, and intensity of an exercise routine, importance of utilizing pulse oximetry during exercise, safety while exercising, and options of places to exercise outside of rehab.     PULMONARY REHAB OTHER RESPIRATORY from 10/25/2017 in [place]  Date  08/23/17  Educator  ep  Instruction Review Code (Retired)  2- meets goals/outcomes      Pulmonary Medications:  -Verbally interactive group education provided by Art therapist with focus on inhaled medications and proper administration.   PULMONARY REHAB OTHER RESPIRATORY from 10/17/2018 in [place]  Date  09/17/18  Educator  Anderson Malta  Instruction Review Code  1- Actuary and Physiology of the Respiratory System and Intimacy:  -Group instruction provided by PowerPoint, verbal discussion, and written material to support subject matter. Instructor reviews respiratory cycle and anatomical  components of the respiratory system and their functions. Instructor also reviews differences in obstructive and restrictive respiratory diseases with examples of each. Intimacy, Sex, and Sexuality differences  are reviewed with a discussion on how relationships can change when diagnosed with pulmonary disease. Common sexual concerns are reviewed.   MD DAY -A group question and answer session with a medical doctor that allows participants to ask questions that relate to their pulmonary disease state.   PULMONARY REHAB OTHER RESPIRATORY from 10/25/2017 in [place]  Date  10/25/17  Educator  [employer]  Instruction Review Code (Retired)  R- Review/reinforce      OTHER EDUCATION -Group or individual verbal, written, or video instructions that support the educational goals of the pulmonary rehab program.   PULMONARY REHAB OTHER RESPIRATORY from 10/17/2018 in [place]  Date  -- [How to beat a sedentary life style]  Educator  [place]  Instruction Review Code  1- Verbalizes Understanding      Holiday Eating Survival Tips:  -Group instruction provided by PowerPoint slides, verbal discussion, and written materials to support subject matter. The instructor gives patients tips, tricks, and techniques to help them not only survive but enjoy the holidays despite the onslaught of food that accompanies the holidays.   Knowledge Questionnaire Score: Knowledge Questionnaire Score - 08/29/18 1511      Knowledge Questionnaire Score   Pre Score  18/18       Core Components/Risk Factors/Patient Goals at Admission: Personal Goals and Risk Factors at Admission - 09/24/18 1007      Core Components/Risk Factors/Patient Goals on Admission   Intervention  Weight Management: Provide education and appropriate resources to help participant work on and attain dietary goals.    Admit Weight  184 lb 11.9 oz (83.8 kg)    Goal Weight: Long Term  184 lb 11.9 oz (83.8 kg)       Core Components/Risk Factors/Patient Goals Review:  Goals and Risk Factor Review    Row Name 09/24/18 1010 10/22/18 0954           Core Components/Risk Factors/Patient Goals  Review   Personal Goals Review  Improve shortness of breath with ADL's;Develop more efficient breathing techniques such as purse lipped breathing and diaphragmatic breathing and practicing self-pacing with activity.;Increase knowledge of respiratory medications and ability to use respiratory devices properly.  Weight Management/Obesity;Improve shortness of breath with ADL's;Increase knowledge of respiratory medications and ability to use respiratory devices properly.;Develop more efficient breathing techniques such as purse lipped breathing and diaphragmatic breathing and practicing self-pacing with activity.      Review  Just started program, has attended 4 exercise sessions, this is his second time in the program. Level 3 on nustep, level 2 on arm ergometer, 8 laps on track  Has lost 2 kg, will counsel on weight maintenance, progressing well with workloads on machines, very happy to be back in the program      Expected Outcomes  see admission outcomes  see admission outcomes         Core Components/Risk Factors/Patient Goals at Discharge (Final Review):  Goals and Risk Factor Review - 10/22/18 0954      Core Components/Risk Factors/Patient Goals Review   Personal Goals Review  Weight Management/Obesity;Improve shortness of breath with ADL's;Increase knowledge of respiratory medications and ability to use respiratory devices properly.;Develop more efficient breathing techniques such as purse lipped breathing and diaphragmatic breathing and practicing self-pacing with activity.    Review  Has  lost 2 kg, will counsel on weight maintenance, progressing well with workloads on machines, very happy to be back in the program    Expected Outcomes  see admission outcomes       ITP Comments: ITP Comments    [place] Name 08/26/18 1354           ITP Comments  Dr. Manfred Arch, Medical Director Pulmonary Rehab          Comments: ITP REVIEW Pt is making expected progress toward pulmonary rehab goals  after completing 9 sessions. Recommend continued exercise, life style modification, education, and utilization of breathing techniques to increase stamina and strength and decrease shortness of breath with exertion.

## 2018-10-28 DIAGNOSIS — J449 Chronic obstructive pulmonary disease, unspecified: Secondary | ICD-10-CM | POA: Diagnosis not present

## 2018-10-29 ENCOUNTER — Encounter (HOSPITAL_COMMUNITY): Payer: PPO

## 2018-10-29 ENCOUNTER — Telehealth (HOSPITAL_COMMUNITY): Payer: Self-pay | Admitting: Family Medicine

## 2018-10-31 ENCOUNTER — Encounter (HOSPITAL_COMMUNITY): Payer: PPO

## 2018-11-01 ENCOUNTER — Telehealth (HOSPITAL_COMMUNITY): Payer: Self-pay

## 2018-11-05 ENCOUNTER — Encounter (HOSPITAL_COMMUNITY)
Admission: RE | Admit: 2018-11-05 | Discharge: 2018-11-05 | Disposition: A | Discharge status: Home / Self Care | Payer: PPO | Source: Ambulatory Visit | Attending: Emergency Medicine | Admitting: Emergency Medicine

## 2018-11-05 VITALS — Wt 183.6 lb

## 2018-11-05 DIAGNOSIS — J439 Emphysema, unspecified: Secondary | ICD-10-CM

## 2018-11-05 NOTE — Progress Notes (Signed)
Daily Session Note  Patient Details  Name: Charles Archer MRN: [id number] Date of Birth: Jul 30, 1936 Referring Provider:     Pulmonary Rehab Walk Test from 08/29/2018 in [place]  Referring Provider  Dr. Lamonte Sakai      Encounter Date: 11/05/2018  Check In: Session Check In - 11/05/18 1330      Check-In   Supervising physician immediately available to respond to emergencies  [facility] immediately available    Physician(s)  Dr. Waldron Labs    Location  [facility]    Staff Present  Joycelyn Man RN, BSN;[name], RN, Bjorn Loser, MS, Exercise Physiologist;Magda Muise Leonia Reeves, RN, BSN    Medication changes reported      No    Fall or balance concerns reported     No    Tobacco Cessation  No Change    Warm-up and Cool-down  Performed as group-led instruction    Resistance Training Performed  Yes    VAD Patient?  No    PAD/SET Patient?  No      Pain Assessment   Currently in Pain?  No/denies    Multiple Pain Sites  No       Capillary Blood Glucose: No results found for this or any previous visit (from the past 24 hour(s)).  Exercise Prescription Changes - 11/05/18 1600      Response to Exercise   Blood Pressure (Admit)  108/58    Blood Pressure (Exercise)  124/76    Blood Pressure (Exit)  112/68    Heart Rate (Admit)  100 bpm    Heart Rate (Exercise)  109 bpm    Heart Rate (Exit)  91 bpm    Oxygen Saturation (Admit)  99 %    Oxygen Saturation (Exercise)  98 %    Oxygen Saturation (Exit)  99 %    Rating of Perceived Exertion (Exercise)  13    Perceived Dyspnea (Exercise)  3    Duration  Progress to 45 minutes of aerobic exercise without signs/symptoms of physical distress    Intensity  THRR unchanged      Progression   Progression  Continue to progress workloads to maintain intensity without signs/symptoms of physical distress.      Resistance Training   Training Prescription  Yes    Weight  orange bands     Reps  10-15    Time  10 Minutes      Oxygen   Oxygen  Continuous    Liters  15      NuStep   Level  4    SPM  80    Minutes  17    METs  2.1      Arm Ergometer   Level  3    RPM  80    Minutes  17       Social History   Tobacco Use  Smoking Status Former Smoker  . Packs/day: 3.00  . Years: 30.00  . Pack years: 90.00  . Types: Cigarettes  . Last attempt to quit: 09/03/1989  . Years since quitting: 29.1  Smokeless Tobacco Never Used    Goals Met:  Proper associated with RPD/PD & O2 Sat Exercise tolerated well Strength training completed today  Goals Unmet:  Not Applicable  Comments: Service time is from 1330 to 1500    Dr. Rush Farmer is Medical Director for Pulmonary Rehab at [facility].

## 2018-11-07 ENCOUNTER — Encounter (HOSPITAL_COMMUNITY)
Admission: RE | Admit: 2018-11-07 | Discharge: 2018-11-07 | Disposition: A | Discharge status: Home / Self Care | Payer: PPO | Source: Ambulatory Visit | Attending: Emergency Medicine | Admitting: Emergency Medicine

## 2018-11-07 DIAGNOSIS — J439 Emphysema, unspecified: Secondary | ICD-10-CM

## 2018-11-07 NOTE — Progress Notes (Signed)
Daily Session Note  Patient Details  Name: Charles Archer MRN: [id number] Date of Birth: Aug 25, 1936 Referring Provider:     Pulmonary Rehab Walk Test from 08/29/2018 in [place]  Referring Provider  Dr. Lamonte Sakai      Encounter Date: 11/07/2018  Check In: Session Check In - 11/07/18 [name]      Check-In   Supervising physician immediately available to respond to emergencies  [facility] immediately available    Physician(s)  Dr. Teryl Lucy    Location  [facility]    Staff Present  Joycelyn Man RN, BSN;Dalton Kris Mouton, MS, Exercise Physiologist;Onell Mcmath Leonia Reeves, RN, BSN    Medication changes reported      No    Fall or balance concerns reported     No    Tobacco Cessation  No Change    Warm-up and Cool-down  Performed as group-led instruction    Resistance Training Performed  Yes    VAD Patient?  No    PAD/SET Patient?  No      Pain Assessment   Currently in Pain?  No/denies    Pain Score  0-No pain    Multiple Pain Sites  No       Capillary Blood Glucose: No results found for this or any previous visit (from the past 24 hour(s)).    Social History   Tobacco Use  Smoking Status Former Smoker  . Packs/day: 3.00  . Years: 30.00  . Pack years: 90.00  . Types: Cigarettes  . Last attempt to quit: 09/03/1989  . Years since quitting: 29.1  Smokeless Tobacco Never Used    Goals Met:  Proper associated with RPD/PD & O2 Sat Exercise tolerated well Strength training completed today  Goals Unmet:  Not Applicable  Comments: Service time is from 1330 to 1500   Dr. Rush Farmer is Medical Director for Pulmonary Rehab at [facility].

## 2018-11-12 ENCOUNTER — Encounter (HOSPITAL_COMMUNITY)
Admission: RE | Admit: 2018-11-12 | Discharge: 2018-11-12 | Disposition: A | Discharge status: Home / Self Care | Payer: PPO | Source: Ambulatory Visit | Attending: Emergency Medicine | Admitting: Emergency Medicine

## 2018-11-12 DIAGNOSIS — J439 Emphysema, unspecified: Secondary | ICD-10-CM

## 2018-11-12 NOTE — Progress Notes (Signed)
Daily Session Note  Patient Details  Name: Charles Archer MRN: [id number] Date of Birth: [birth year] Referring Provider:     Pulmonary Rehab Walk Test from 08/29/2018 in [place]  Referring Provider  Dr. Lamonte Sakai      Encounter Date: 11/12/2018  Check In: Session Check In - 11/12/18 1519      Check-In   Supervising physician immediately available to respond to emergencies  [facility] immediately available    Physician(s)  Dr. Teryl Lucy    Location  [facility]    Staff Present  Joycelyn Man RN, Maxcine Ham, RN, Bjorn Loser, MS, Exercise Physiologist;Molly DiVincenzo, MS, ACSM RCEP, Exercise Physiologist    Medication changes reported      No    Fall or balance concerns reported     No    Tobacco Cessation  No Change    Warm-up and Cool-down  Performed as group-led instruction    Resistance Training Performed  Yes    VAD Patient?  No    PAD/SET Patient?  No      Pain Assessment   Currently in Pain?  No/denies    Pain Score  0-No pain    Multiple Pain Sites  No       Capillary Blood Glucose: No results found for this or any previous visit (from the past 24 hour(s)).    Social History   Tobacco Use  Smoking Status Former Smoker  . Packs/day: 3.00  . Years: 30.00  . Pack years: 90.00  . Types: Cigarettes  . Last attempt to quit: 09/03/1989  . Years since quitting: 29.2  Smokeless Tobacco Never Used    Goals Met:  Proper associated with RPD/PD & O2 Sat Exercise tolerated well Strength training completed today  Goals Unmet:  Not Applicable  Comments: Service time is from 1330 to 1500    Dr. Rush Farmer is Medical Director for Pulmonary Rehab at [facility].

## 2018-11-14 ENCOUNTER — Encounter (HOSPITAL_COMMUNITY)
Admission: RE | Admit: 2018-11-14 | Discharge: 2018-11-14 | Disposition: A | Discharge status: Home / Self Care | Payer: PPO | Source: Ambulatory Visit | Attending: Emergency Medicine | Admitting: Emergency Medicine

## 2018-11-14 DIAGNOSIS — J439 Emphysema, unspecified: Secondary | ICD-10-CM | POA: Diagnosis not present

## 2018-11-14 NOTE — Progress Notes (Signed)
Daily Session Note  Patient Details  Name: Charles Archer MRN: [id number] Date of Birth: September 30, 1936 Referring Provider:     Pulmonary Rehab Walk Test from 08/29/2018 in [place]  Referring Provider  Dr. Lamonte Sakai      Encounter Date: 11/14/2018  Check In: Session Check In - 11/14/18 1343      Check-In   Supervising physician immediately available to respond to emergencies  [facility] immediately available    Physician(s)  Dr. Waldron Labs    Location  [facility]    Staff Present  Joycelyn Man RN, BSN;[name], RN, Bjorn Loser, MS, Exercise Physiologist;Lisa Ysidro Evert, RN;Molly DiVincenzo, MS, ACSM RCEP, Exercise Physiologist    Medication changes reported      No    Fall or balance concerns reported     No    Tobacco Cessation  No Change    Warm-up and Cool-down  Performed as group-led instruction    Resistance Training Performed  Yes    VAD Patient?  No    PAD/SET Patient?  No      Pain Assessment   Currently in Pain?  No/denies    Pain Score  0-No pain    Multiple Pain Sites  No       Capillary Blood Glucose: No results found for this or any previous visit (from the past 24 hour(s)).    Social History   Tobacco Use  Smoking Status Former Smoker  . Packs/day: 3.00  . Years: 30.00  . Pack years: 90.00  . Types: Cigarettes  . Last attempt to quit: 09/03/1989  . Years since quitting: 29.2  Smokeless Tobacco Never Used    Goals Met:  Proper associated with RPD/PD & O2 Sat Exercise tolerated well Strength training completed today  Goals Unmet:  Not Applicable  Comments: Service time is from 1330 to [place]   Dr. Rush Farmer is Medical Director for Pulmonary Rehab at [facility].

## 2018-11-19 ENCOUNTER — Encounter (HOSPITAL_COMMUNITY)
Admission: RE | Admit: 2018-11-19 | Discharge: 2018-11-19 | Disposition: A | Discharge status: Home / Self Care | Payer: PPO | Source: Ambulatory Visit | Attending: Emergency Medicine | Admitting: Emergency Medicine

## 2018-11-19 VITALS — Wt 179.9 lb

## 2018-11-19 DIAGNOSIS — J439 Emphysema, unspecified: Secondary | ICD-10-CM | POA: Diagnosis not present

## 2018-11-19 NOTE — Progress Notes (Signed)
Pulmonary Individual Treatment Plan  Patient Details  Name: Charles Archer MRN: [id number] Date of Birth: 12/12/1935 Referring Provider:     Pulmonary Rehab Walk Test from 08/29/2018 in [facility]  Referring Provider  Dr. Lamonte Sakai      Initial Encounter Date:    Pulmonary Rehab Walk Test from 08/29/2018 in [facility]  Date  08/29/18      Visit Diagnosis: Pulmonary emphysema, unspecified emphysema type ([place])  Patient's Home Medications on Admission:   Current Outpatient Medications:  .  acetaminophen (TYLENOL ARTHRITIS PAIN) 650 MG CR tablet, Take 650 mg by mouth every 8 (eight) hours as needed for pain., Disp: , Rfl:  .  albuterol (PROAIR HFA) 108 (90 Base) MCG/ACT inhaler, Inhale 2 puffs into the lungs every 6 (six) hours as needed for wheezing or shortness of breath., Disp: 1 Inhaler, Rfl: 5 .  allopurinol (ZYLOPRIM) 100 MG tablet, Take 100 mg by mouth daily., Disp: , Rfl:  .  aspirin 81 MG tablet, Take 81 mg by mouth daily., Disp: , Rfl:  .  B Complex-Folic Acid (SUPER B COMPLEX MAXI PO), Take 1 tablet by mouth daily. , Disp: , Rfl:  .  calcium carbonate (OS-CAL) 600 MG TABS tablet, Take 600 mg by mouth daily., Disp: , Rfl:  .  Cinnamon 500 MG capsule, Take 1,000 mg by mouth daily., Disp: , Rfl:  .  docusate sodium (COLACE) 50 MG capsule, Take 50 mg by mouth daily as needed for mild constipation. , Disp: , Rfl:  .  fluorouracil (EFUDEX) 5 % cream, Apply 5 % topically 2 (two) times daily as needed. , Disp: , Rfl:  .  fluticasone (FLONASE) 50 MCG/ACT nasal spray, PLACE 2 SPRAYS INTO BOTH NOSTRILS DAILY., Disp: 16 g, Rfl: 5 .  Ginkgo Biloba 40 MG TABS, Take 40 mg by mouth daily. , Disp: , Rfl:  .  ipratropium-albuterol (DUONEB) 0.5-2.5 (3) MG/3ML SOLN, INHALE 1 VIAL ( 3ML'S ) VIA NEBULIZER BY MOUTH  FOUR TIMES A DAY, Disp: 360 mL, Rfl: 2 .  irbesartan (AVAPRO) 300 MG tablet, Take 1 tablet by mouth daily., Disp: , Rfl:  .   Misc Natural Products (OSTEO BI-FLEX ADV JOINT SHIELD PO), Take 1,500 mg by mouth daily., Disp: , Rfl:  .  Misc. Devices (ACAPELLA) MISC, Use as directed, Disp: 1 each, Rfl: 0 .  Multiple Vitamin (MULTIVITAMIN) capsule, Take 1 capsule by mouth daily., Disp: , Rfl:  .  Naproxen Sodium (ALEVE PO), Take 220 mg by mouth as needed (for pain). , Disp: , Rfl:  .  niacin 250 MG tablet, Take 250 mg by mouth at bedtime., Disp: , Rfl:  .  sildenafil (REVATIO) 20 MG tablet, Take 1 tablet by mouth daily., Disp: , Rfl:  .  tamsulosin (FLOMAX) 0.4 MG CAPS capsule, Take 1 capsule by mouth daily., Disp: , Rfl:  .  vitamin B-12 (CYANOCOBALAMIN) 1000 MCG tablet, Take 1,000 mcg by mouth daily., Disp: , Rfl:  .  zinc gluconate 50 MG tablet, Take 50 mg by mouth daily., Disp: , Rfl:   Past Medical History: Past Medical History:  Diagnosis Date  . Abnormal stress test    s/p cardiac cath 10/2013 - nonobstructive CAD with normal LV function  . CKD (chronic kidney disease)   . COPD (chronic obstructive pulmonary disease) ([place])   . Dyspnea on exertion   . Emphysema lung ([place])   . GERD (gastroesophageal reflux disease)   . Gout   .  HTN (hypertension)   . Hyperlipidemia     Tobacco Use: Social History   Tobacco Use  Smoking Status Former Smoker  . Packs/day: 3.00  . Years: 30.00  . Pack years: 90.00  . Types: Cigarettes  . Last attempt to quit: 09/03/1989  . Years since quitting: 29.2  Smokeless Tobacco Never Used    Labs: Recent Review Flowsheet Data    There is no flowsheet data to display.      Capillary Blood Glucose: No results found for: GLUCAP   Pulmonary Assessment Scores: Pulmonary Assessment Scores    Row Name 08/29/18 1509 08/29/18 1630       ADL UCSD   ADL Phase  Entry  Entry    SOB Score total  89  -      CAT Score   CAT Score  pre 18  -      mMRC Score   mMRC Score  -  3       Pulmonary Function Assessment:   Exercise Target Goals: Exercise Program  Goal: Individual exercise prescription set using results from initial 6 min walk test and THRR while considering  patient's activity barriers and safety.   Exercise Prescription Goal: Initial exercise prescription builds to 30-45 minutes a day of aerobic activity, 2-3 days per week.  Home exercise guidelines will be given to patient during program as part of exercise prescription that the participant will acknowledge.  Activity Barriers & Risk Stratification: Activity Barriers & Cardiac Risk Stratification - 08/26/18 1405      Activity Barriers & Cardiac Risk Stratification   Activity Barriers  Deconditioning;Arthritis;Joint Problems;Shortness of Breath       6 Minute Walk: 6 Minute Walk    Row Name 08/29/18 1633         6 Minute Walk   Phase  Initial     Distance  615 feet     Walk Time  3.75 minutes     # of Rest Breaks  2     MPH  1.16     METS  1.92     RPE  13     Perceived Dyspnea   3     Symptoms  Yes (comment)     Comments  On 6 L pt desaturated to 84%. Following at 1.5 minute break the pt was bumped up to 8 L, desaturated, then bumped up to 10 L. At 10 L the pt desaturated again and took a seated rest break for the last 45 seconds. The pt was put on 15 L for the remainder of time and saturation levels rose back to acceptable levels.      Resting HR  104 bpm     Resting BP  106/82     Resting Oxygen Saturation   98 %     Exercise Oxygen Saturation  during 6 min walk  84 %     Max Ex. HR  127 bpm     Max Ex. BP  130/76     2 Minute Post BP  110/68       Interval HR   1 Minute HR  117     2 Minute HR  118     3 Minute HR  110     4 Minute HR  118     5 Minute HR  121     6 Minute HR  127     2 Minute Post HR  114     Interval Heart  Rate?  Yes       Interval Oxygen   Interval Oxygen?  Yes     Baseline Oxygen Saturation %  98 %     1 Minute Oxygen Saturation %  95 %     1 Minute Liters of Oxygen  6 L     2 Minute Oxygen Saturation %  84 %     2 Minute Liters  of Oxygen  6 L     3 Minute Oxygen Saturation %  86 %     3 Minute Liters of Oxygen  8 L     4 Minute Oxygen Saturation %  97 %     4 Minute Liters of Oxygen  10 L     5 Minute Oxygen Saturation %  90 %     5 Minute Liters of Oxygen  10 L     6 Minute Oxygen Saturation %  84 %     6 Minute Liters of Oxygen  10 L     2 Minute Post Oxygen Saturation %  99 %     2 Minute Post Liters of Oxygen  15 L        Oxygen Initial Assessment: Oxygen Initial Assessment - 11/18/18 0729      Home Oxygen   Home Oxygen Device  E-Tanks;Home Concentrator    Sleep Oxygen Prescription  Continuous    Liters per minute  6    Home Exercise Oxygen Prescription  Continuous    Liters per minute  15    Home at Rest Exercise Oxygen Prescription  Continuous    Liters per minute  6    Compliance with Home Oxygen Use  Yes      Program Oxygen Prescription   Program Oxygen Prescription  Continuous    Liters per minute  --   10-15   Comments  uses a non-rebreather      Intervention   Short Term Goals  To learn and exhibit compliance with exercise, home and travel O2 prescription;To learn and understand importance of maintaining oxygen saturations>88%;To learn and demonstrate proper use of respiratory medications;To learn and understand importance of monitoring SPO2 with pulse oximeter and demonstrate accurate use of the pulse oximeter.;To learn and demonstrate proper pursed lip breathing techniques or other breathing techniques.    Long  Term Goals  Exhibits compliance with exercise, home and travel O2 prescription;Maintenance of O2 saturations>88%;Compliance with respiratory medication;Verbalizes importance of monitoring SPO2 with pulse oximeter and return demonstration;Exhibits proper breathing techniques, such as pursed lip breathing or other method taught during program session;Demonstrates proper use of MDI's       Oxygen Re-Evaluation: Oxygen Re-Evaluation    Row Name 09/25/18 0801 10/22/18 0855            Program Oxygen Prescription   Program Oxygen Prescription  Continuous  Continuous      Liters per minute  15  15      Comments  15 while walking, 10 during seated exercise  15 while walking, 8 during seated exercise        Home Oxygen   Home Oxygen Device  E-Tanks;Home Concentrator  E-Tanks;Home Concentrator      Sleep Oxygen Prescription  Continuous  Continuous      Liters per minute  6  6      Home Exercise Oxygen Prescription  Continuous  Continuous      Liters per minute  15  15      Home at Rest  Exercise Oxygen Prescription  Continuous  Continuous      Liters per minute  6  6      Compliance with Home Oxygen Use  Yes  Yes        Goals/Expected Outcomes   Short Term Goals  To learn and exhibit compliance with exercise, home and travel O2 prescription;To learn and understand importance of maintaining oxygen saturations>88%;To learn and demonstrate proper use of respiratory medications;To learn and understand importance of monitoring SPO2 with pulse oximeter and demonstrate accurate use of the pulse oximeter.;To learn and demonstrate proper pursed lip breathing techniques or other breathing techniques.  To learn and exhibit compliance with exercise, home and travel O2 prescription;To learn and understand importance of maintaining oxygen saturations>88%;To learn and demonstrate proper use of respiratory medications;To learn and understand importance of monitoring SPO2 with pulse oximeter and demonstrate accurate use of the pulse oximeter.;To learn and demonstrate proper pursed lip breathing techniques or other breathing techniques.      Long  Term Goals  Exhibits compliance with exercise, home and travel O2 prescription;Maintenance of O2 saturations>88%;Compliance with respiratory medication;Verbalizes importance of monitoring SPO2 with pulse oximeter and return demonstration;Exhibits proper breathing techniques, such as pursed lip breathing or other method taught during program  session;Demonstrates proper use of MDI's  Exhibits compliance with exercise, home and travel O2 prescription;Maintenance of O2 saturations>88%;Compliance with respiratory medication;Verbalizes importance of monitoring SPO2 with pulse oximeter and return demonstration;Exhibits proper breathing techniques, such as pursed lip breathing or other method taught during program session;Demonstrates proper use of MDI's      Goals/Expected Outcomes  compliance   compliance          Oxygen Discharge (Final Oxygen Re-Evaluation): Oxygen Re-Evaluation - 10/22/18 0855      Program Oxygen Prescription   Program Oxygen Prescription  Continuous    Liters per minute  15    Comments  15 while walking, 8 during seated exercise      Home Oxygen   Home Oxygen Device  E-Tanks;Home Concentrator    Sleep Oxygen Prescription  Continuous    Liters per minute  6    Home Exercise Oxygen Prescription  Continuous    Liters per minute  15    Home at Rest Exercise Oxygen Prescription  Continuous    Liters per minute  6    Compliance with Home Oxygen Use  Yes      Goals/Expected Outcomes   Short Term Goals  To learn and exhibit compliance with exercise, home and travel O2 prescription;To learn and understand importance of maintaining oxygen saturations>88%;To learn and demonstrate proper use of respiratory medications;To learn and understand importance of monitoring SPO2 with pulse oximeter and demonstrate accurate use of the pulse oximeter.;To learn and demonstrate proper pursed lip breathing techniques or other breathing techniques.    Long  Term Goals  Exhibits compliance with exercise, home and travel O2 prescription;Maintenance of O2 saturations>88%;Compliance with respiratory medication;Verbalizes importance of monitoring SPO2 with pulse oximeter and return demonstration;Exhibits proper breathing techniques, such as pursed lip breathing or other method taught during program session;Demonstrates proper use of MDI's     Goals/Expected Outcomes  compliance        Initial Exercise Prescription: Initial Exercise Prescription - 08/29/18 1600      Date of Initial Exercise RX and Referring Provider   Date  08/29/18    Referring Provider  Dr. Lamonte Sakai      Oxygen   Oxygen  Continuous    Liters  15  NuStep   Level  2    SPM  80    Minutes  17      Arm Ergometer   Level  1.5    RPM  80    Minutes  17      Track   Laps  7    Minutes  17      Prescription Details   Frequency (times per week)  2    Duration  Progress to 45 minutes of aerobic exercise without signs/symptoms of physical distress      Intensity   THRR 40-80% of Max Heartrate  56-112    Ratings of Perceived Exertion  11-13    Perceived Dyspnea  0-4      Progression   Progression  Continue to progress workloads to maintain intensity without signs/symptoms of physical distress.      Resistance Training   Training Prescription  Yes    Weight  orange bands    Reps  10-15       Perform Capillary Blood Glucose checks as needed.  Exercise Prescription Changes: Exercise Prescription Changes    Row Name 09/10/18 1600 09/24/18 1600 10/01/18 1254 10/15/18 1500 10/17/18 1544     Response to Exercise   Blood Pressure (Admit)  100/60  112/64  104/66  -  122/54   Blood Pressure (Exercise)  124/60  120/70  116/52  -  118/60   Blood Pressure (Exit)  99/67  120/60  98/60  -  120/60   Heart Rate (Admit)  107 bpm  110 bpm  98 bpm  -  96 bpm   Heart Rate (Exercise)  121 bpm  111 bpm  108 bpm  -  101 bpm   Heart Rate (Exit)  99 bpm  103 bpm  93 bpm  -  90 bpm   Oxygen Saturation (Admit)  98 %  99 %  100 %  -  99 %   Oxygen Saturation (Exercise)  95 %  98 %  96 %  -  96 %   Oxygen Saturation (Exit)  99 %  98 %  99 %  -  100 %   Rating of Perceived Exertion (Exercise)  _0 -  13   Perceived Dyspnea (Exercise)  _1 -  1   Duration  Progress to 45 minutes of aerobic exercise without signs/symptoms of physical distress   Progress to 45 minutes of aerobic exercise without signs/symptoms of physical distress  Progress to 45 minutes of aerobic exercise without signs/symptoms of physical distress  -  Progress to 45 minutes of aerobic exercise without signs/symptoms of physical distress   Intensity  Other (comment) 40-80% of HRR  THRR unchanged  THRR unchanged  -  THRR unchanged     Progression   Progression  Continue to progress workloads to maintain intensity without signs/symptoms of physical distress.  Continue to progress workloads to maintain intensity without signs/symptoms of physical distress.  Continue to progress workloads to maintain intensity without signs/symptoms of physical distress.  -  Continue to progress workloads to maintain intensity without signs/symptoms of physical distress.     Resistance Training   Training Prescription  Yes  Yes  Yes  -  Yes   Weight  orange bands  orange bands  orange bands  -  orange bands   Reps  10-15  10-15  10-15  -  10-15   Time  10 Minutes  10 Minutes  10 Minutes  -  10 Minutes     Oxygen   Oxygen  -  -  Continuous  -  Continuous   Liters  -  -  15  -  15     NuStep   Level  '2  3  4  '$ -  4   SPM  80  80  80  -  80   Minutes  '17  17  17  '$ -  17   METs  -  -  1.9  -  1.9     Arm Ergometer   Level  '2  3  3  '$ -  3.5   RPM  -  80  80  -  80   Minutes  '17  17  17  '$ -  17     Track   Laps  5  6.5  6  -  -   Minutes  '17  17  17  '$ -  -     Home Exercise Plan   Plans to continue exercise at  -  -  [employer] (comment[place] shopping centers  -   Frequency  -  -  -  Add 2 additional days to program exercise sessions.  -   Initial Home Exercises Provided  -  -  -  10/15/18  -   [place] Name 11/05/18 1600 11/19/18 1500           Response to Exercise   Blood Pressure (Admit)  108/58  110/64      Blood Pressure (Exercise)  124/76  108/58      Blood Pressure (Exit)  112/68  118/64      Heart Rate (Admit)  100 bpm  101 bpm      Heart Rate (Exercise)  109 bpm   110 bpm      Heart Rate (Exit)  91 bpm  93 bpm      Oxygen Saturation (Admit)  99 %  99 %      Oxygen Saturation (Exercise)  98 %  97 %      Oxygen Saturation (Exit)  99 %  100 %      Rating of Perceived Exertion (Exercise)  13  13      Perceived Dyspnea (Exercise)  3  3      Duration  Progress to 45 minutes of aerobic exercise without signs/symptoms of physical distress  Progress to 45 minutes of aerobic exercise without signs/symptoms of physical distress      Intensity  THRR unchanged  THRR unchanged        Progression   Progression  Continue to progress workloads to maintain intensity without signs/symptoms of physical distress.  Continue to progress workloads to maintain intensity without signs/symptoms of physical distress.        Resistance Training   Training Prescription  Yes  Yes      Weight  orange bands  orange bands      Reps  10-15  10-15      Time  10 Minutes  10 Minutes        Oxygen   Oxygen  Continuous  Continuous      Liters  15  6-8         NuStep   Level  4  4      SPM  80  80      Minutes  17  34  METs  2.1  1.9        Arm Ergometer   Level  3  3      RPM  80  80      Minutes  17  17         Exercise Comments: Exercise Comments    Row Name 10/15/18 1529           Exercise Comments  home exercise complete          Exercise Goals and Review: Exercise Goals    [place] Name 08/26/18 1406             Exercise Goals   Increase Physical Activity  Yes       Intervention  Provide advice, education, support and counseling about physical activity/exercise needs.;Develop an individualized exercise prescription for aerobic and resistive training based on initial evaluation findings, risk stratification, comorbidities and participant's personal goals.       Expected Outcomes  Short Term: Attend rehab on a regular basis to increase amount of physical activity.;Long Term: Exercising regularly at least 3-5 days a week.;Long Term: Add in home exercise to  make exercise part of routine and to increase amount of physical activity.       Increase Strength and Stamina  Yes       Intervention  Provide advice, education, support and counseling about physical activity/exercise needs.;Develop an individualized exercise prescription for aerobic and resistive training based on initial evaluation findings, risk stratification, comorbidities and participant's personal goals.       Expected Outcomes  Short Term: Increase workloads from initial exercise prescription for resistance, speed, and METs.;Short Term: Perform resistance training exercises routinely during rehab and add in resistance training at home;Long Term: Improve cardiorespiratory fitness, muscular endurance and strength as measured by increased METs and functional capacity (6MWT)       Able to understand and use rate of perceived exertion (RPE) scale  Yes       Intervention  Provide education and explanation on how to use RPE scale       Expected Outcomes  Short Term: Able to use RPE daily in rehab to express subjective intensity level;Long Term:  Able to use RPE to guide intensity level when exercising independently       Able to understand and use Dyspnea scale  Yes       Intervention  Provide education and explanation on how to use Dyspnea scale       Expected Outcomes  Short Term: Able to use Dyspnea scale daily in rehab to express subjective sense of shortness of breath during exertion;Long Term: Able to use Dyspnea scale to guide intensity level when exercising independently       Knowledge and understanding of Target Heart Rate Range (THRR)  Yes       Intervention  Provide education and explanation of THRR including how the numbers were predicted and where they are located for reference       Expected Outcomes  Short Term: Able to state/look up THRR;Long Term: Able to use THRR to govern intensity when exercising independently;Short Term: Able to use daily as guideline for intensity in rehab        Understanding of Exercise Prescription  Yes       Intervention  Provide education, explanation, and written materials on patient's individual exercise prescription       Expected Outcomes  Short Term: Able to explain program exercise prescription;Long Term: Able to explain home exercise prescription  to exercise independently          Exercise Goals Re-Evaluation : Exercise Goals Re-Evaluation    Row Name 09/25/18 0802 10/22/18 0856 11/18/18 0722         Exercise Goal Re-Evaluation   Exercise Goals Review  Increase Physical Activity;Increase Strength and Stamina;Able to understand and use rate of perceived exertion (RPE) scale;Able to understand and use Dyspnea scale;Knowledge and understanding of Target Heart Rate Range (THRR);Understanding of Exercise Prescription  Increase Physical Activity;Increase Strength and Stamina;Able to understand and use rate of perceived exertion (RPE) scale;Able to understand and use Dyspnea scale;Knowledge and understanding of Target Heart Rate Range (THRR);Understanding of Exercise Prescription  Increase Physical Activity;Increase Strength and Stamina;Able to understand and use rate of perceived exertion (RPE) scale;Able to understand and use Dyspnea scale;Knowledge and understanding of Target Heart Rate Range (THRR);Understanding of Exercise Prescription     Comments  Pt has completed 5 exercise sessions. He has been open to workload increases and is very motivated. Pt can walk 8 laps in 15 minutes around track (1 lap=200 ft). He exercises at low MET levels ~2.0. I expect progress to be slow. Will continue to monitor and progress as able.   Pt is progressing well. Pt has recently been hindered by a hurt knee. Pt continues to exercise at low MET levels (2.0). Will continue to monitor and progress as able.   Patient is progressing slow and steady. His MET level falls in lower level. He is hindered by knee pain and a need for a high liter flow when he exercises. He  routinely perceives his exercise as somewhat hard. Will cont. to monitor and progress as able and encourage home exercise.     Expected Outcomes  Through exercise at rehab and at home, patient will increase strength and stamina making ADL's easier to perform. Patient will also have a better understanding of safe exercise and what they are capable to do outside of clinical supervision.  -  Through exercise at rehab, patient will increase physical capacity, decrease shortness of breath, and feel confident in implementing and exercise routine at home. Also, when patient completes their graduation 6MWT they will increase their walk test distance by atleast 100 feet.         Discharge Exercise Prescription (Final Exercise Prescription Changes): Exercise Prescription Changes - 11/19/18 1500      Response to Exercise   Blood Pressure (Admit)  110/64    Blood Pressure (Exercise)  108/58    Blood Pressure (Exit)  118/64    Heart Rate (Admit)  101 bpm    Heart Rate (Exercise)  110 bpm    Heart Rate (Exit)  93 bpm    Oxygen Saturation (Admit)  99 %    Oxygen Saturation (Exercise)  97 %    Oxygen Saturation (Exit)  100 %    Rating of Perceived Exertion (Exercise)  13    Perceived Dyspnea (Exercise)  3    Duration  Progress to 45 minutes of aerobic exercise without signs/symptoms of physical distress    Intensity  THRR unchanged      Progression   Progression  Continue to progress workloads to maintain intensity without signs/symptoms of physical distress.      Resistance Training   Training Prescription  Yes    Weight  orange bands    Reps  10-15    Time  10 Minutes      Oxygen   Oxygen  Continuous    Liters  6-8  NuStep   Level  4    SPM  80    Minutes  34    METs  1.9      Arm Ergometer   Level  3    RPM  80    Minutes  17       Nutrition:  Target Goals: Understanding of nutrition guidelines, daily intake of sodium '1500mg'$ , cholesterol '200mg'$ , calories 30% from fat and 7%  or less from saturated fats, daily to have 5 or more servings of fruits and vegetables.  Biometrics:    Nutrition Therapy Plan and Nutrition Goals: Nutrition Therapy & Goals - 10/15/18 1523      Nutrition Therapy   Diet  Heart Healthy      Personal Nutrition Goals   Nutrition Goal  Describe the benefit of including fruits, vegetables, whole grains, and low-fat dairy products in a healthy meal plan.    Personal Goal #2  eat reguarly across the day, 5-6 small meals/snacks    Personal Goal #3  The pt will recognize symptoms that can interfere with adequate oral intake      Intervention Plan   Intervention  Prescribe, educate and counsel regarding individualized specific dietary modifications aiming towards targeted core components such as weight, hypertension, lipid management, diabetes, heart failure and other comorbidities.    Expected Outcomes  Short Term Goal: Understand basic principles of dietary content, such as calories, fat, sodium, cholesterol and nutrients.;Long Term Goal: Adherence to prescribed nutrition plan.       Nutrition Assessments: Nutrition Assessments - 10/15/18 1524      Rate Your Plate Scores   Pre Score  45       Nutrition Goals Re-Evaluation:   Nutrition Goals Discharge (Final Nutrition Goals Re-Evaluation):   Psychosocial: Target Goals: Acknowledge presence or absence of significant depression and/or stress, maximize coping skills, provide positive support system. Participant is able to verbalize types and ability to use techniques and skills needed for reducing stress and depression.  Initial Review & Psychosocial Screening: Initial Psych Review & Screening - 08/26/18 1411      Initial Review   Current issues with  None Identified      Family Dynamics   Good Support System?  Yes      Barriers   Psychosocial barriers to participate in program  There are no identifiable barriers or psychosocial needs.;The patient should benefit from training in  stress management and relaxation.      Screening Interventions   Interventions  Encouraged to exercise    Expected Outcomes  Long Term goal: The participant improves quality of Life and PHQ9 Scores as seen by post scores and/or verbalization of changes;Short Term goal: Identification and review with participant of any Quality of Life or Depression concerns found by scoring the questionnaire.       Quality of Life Scores:  Scores of 19 and below usually indicate a poorer quality of life in these areas.  A difference of  2-3 points is a clinically meaningful difference.  A difference of 2-3 points in the total score of the Quality of Life Index has been associated with significant improvement in overall quality of life, self-image, physical symptoms, and general health in studies assessing change in quality of life.  PHQ-9: Recent Review Flowsheet Data    Depression screen [facility] 2/9 08/26/2018 05/28/2017   Decreased Interest 0 0   Down, Depressed, Hopeless 0 0   PHQ - 2 Score 0 0   Altered sleeping 0 0  Tired, decreased energy 2 0   Change in appetite 0 0   Feeling bad or failure about yourself  0 0   Trouble concentrating 0 0   Moving slowly or fidgety/restless 0 0   Suicidal thoughts 0 0   PHQ-9 Score 2 0   Difficult doing work/chores Not difficult at all Not difficult at all     Interpretation of Total Score  Total Score Depression Severity:  1-4 = Minimal depression, 5-9 = Mild depression, 10-14 = Moderate depression, 15-19 = Moderately severe depression, 20-27 = Severe depression   Psychosocial Evaluation and Intervention: Psychosocial Evaluation - 10/22/18 0951      Psychosocial Evaluation & Interventions   Interventions  Encouraged to exercise with the program and follow exercise prescription    Comments  No psychosocial barriers have been identified at this time    Expected Outcomes  patient will remain free from psychosocial barriers to participation    Continue  Psychosocial Services   No Follow up required       Psychosocial Re-Evaluation: Psychosocial Re-Evaluation    [place] Name 09/24/18 1007 10/22/18 0952 11/19/18 1623         Psychosocial Re-Evaluation   Current issues with  None Identified  None Identified  None Identified     Comments  -  no psychosocial barriers have been identied at this time  no psychosocial barriers have been identied at this time     Expected Outcomes  patient will remain free from psychosocial barriers to participation in pulmonary rehab   patient will remain free from psychosocial barriers to participation in pulmonary rehab   patient will remain free from psychosocial barriers to participation in pulmonary rehab      Interventions  Encouraged to attend Pulmonary Rehabilitation for the exercise  Encouraged to attend Pulmonary Rehabilitation for the exercise  Encouraged to attend Pulmonary Rehabilitation for the exercise     Continue Psychosocial Services   No Follow up required  No Follow up required  No Follow up required        Psychosocial Discharge (Final Psychosocial Re-Evaluation): Psychosocial Re-Evaluation - 11/19/18 1623      Psychosocial Re-Evaluation   Current issues with  None Identified    Comments  no psychosocial barriers have been identied at this time    Expected Outcomes  patient will remain free from psychosocial barriers to participation in pulmonary rehab     Interventions  Encouraged to attend Pulmonary Rehabilitation for the exercise    Continue Psychosocial Services   No Follow up required       Education: Education Goals: Education classes will be provided on a weekly basis, covering required topics. Participant will state understanding/return demonstration of topics presented.  Learning Barriers/Preferences: Learning Barriers/Preferences - 08/26/18 1412      Learning Barriers/Preferences   Learning Barriers  None    Learning Preferences  Written Material;Individual Instruction;Group  Instruction;Verbal Instruction       Education Topics: Risk Factor Reduction:  -Group instruction that is supported by a PowerPoint presentation. Instructor discusses the definition of a risk factor, different risk factors for pulmonary disease, and how the heart and lungs work together.     Nutrition for Pulmonary Patient:  -Group instruction provided by PowerPoint slides, verbal discussion, and written materials to support subject matter. The instructor gives an explanation and review of healthy diet recommendations, which includes a discussion on weight management, recommendations for fruit and vegetable consumption, as well as protein, fluid, caffeine, fiber, sodium, sugar,  and alcohol. Tips for eating when patients are short of breath are discussed.   PULMONARY REHAB OTHER RESPIRATORY from 10/25/2017 in [place]  Date  10/18/17  Educator  Nutritionist  Instruction Review Code (Retired)  2- meets goals/outcomes      Pursed Lip Breathing:  -Group instruction that is supported by demonstration and informational handouts. Instructor discusses the benefits of pursed lip and diaphragmatic breathing and detailed demonstration on how to preform both.     Oxygen Safety:  -Group instruction provided by PowerPoint, verbal discussion, and written material to support subject matter. There is an overview of "What is Oxygen" and "Why do we need it".  Instructor also reviews how to create a safe environment for oxygen use, the importance of using oxygen as prescribed, and the risks of noncompliance. There is a brief discussion on traveling with oxygen and resources the patient may utilize.   Oxygen Equipment:  -Group instruction provided by [facility] Staff utilizing handouts, written materials, and equipment demonstrations.   PULMONARY REHAB OTHER RESPIRATORY from 10/25/2017 in [place]  Date  09/13/17  Educator  [employer]   Instruction Review Code (Retired)  2- meets goals/outcomes      Signs and Symptoms:  -Group instruction provided by written material and verbal discussion to support subject matter. Warning signs and symptoms of infection, stroke, and heart attack are reviewed and when to call the physician/911 reinforced. Tips for preventing the spread of infection discussed.   PULMONARY REHAB OTHER RESPIRATORY from 11/14/2018 in [place]  Date  10/17/18  Educator   Remo Lipps  Instruction Review Code  1- Verbalizes Understanding      Advanced Directives:  -Group instruction provided by verbal instruction and written material to support subject matter. Instructor reviews Advanced Directive laws and proper instruction for filling out document.   Pulmonary Video:  -Group video education that reviews the importance of medication and oxygen compliance, exercise, good nutrition, pulmonary hygiene, and pursed lip and diaphragmatic breathing for the pulmonary patient.   PULMONARY REHAB OTHER RESPIRATORY from 10/25/2017 in [place]  Date  07/12/17  Instruction Review Code (Retired)  2- meets goals/outcomes      Exercise for the Pulmonary Patient:  -Group instruction that is supported by a PowerPoint presentation. Instructor discusses benefits of exercise, core components of exercise, frequency, duration, and intensity of an exercise routine, importance of utilizing pulse oximetry during exercise, safety while exercising, and options of places to exercise outside of rehab.     PULMONARY REHAB OTHER RESPIRATORY from 10/25/2017 in [place]  Date  08/23/17  Educator  ep  Instruction Review Code (Retired)  2- meets goals/outcomes      Pulmonary Medications:  -Verbally interactive group education provided by Art therapist with focus on inhaled medications and proper administration.   PULMONARY REHAB OTHER RESPIRATORY  from 11/14/2018 in [place]  Date  11/05/18  Educator  Anderson Malta  Instruction Review Code  1- Actuary and Physiology of the Respiratory System and Intimacy:  -Group instruction provided by PowerPoint, verbal discussion, and written material to support subject matter. Instructor reviews respiratory cycle and anatomical components of the respiratory system and their functions. Instructor also reviews differences in obstructive and restrictive respiratory diseases with examples of each. Intimacy, Sex, and Sexuality differences are reviewed with a discussion on how relationships can change  when diagnosed with pulmonary disease. Common sexual concerns are reviewed.   PULMONARY REHAB OTHER RESPIRATORY from 11/14/2018 in [place]  Date  11/14/18  Educator  RN  Instruction Review Code  1- Verbalizes Understanding      MD DAY -A group question and answer session with a medical doctor that allows participants to ask questions that relate to their pulmonary disease state.   PULMONARY REHAB OTHER RESPIRATORY from 10/25/2017 in [place]  Date  10/25/17  Educator  yacoub  Instruction Review Code (Retired)  R- Review/reinforce      OTHER EDUCATION -Group or individual verbal, written, or video instructions that support the educational goals of the pulmonary rehab program.   PULMONARY REHAB OTHER RESPIRATORY from 11/14/2018 in [place]  Date  -- [How to beat a sedentary life style]  Educator  Wilson  Instruction Review Code  1- Verbalizes Understanding      Holiday Eating Survival Tips:  -Group instruction provided by PowerPoint slides, verbal discussion, and written materials to support subject matter. The instructor gives patients tips, tricks, and techniques to help them not only survive but enjoy the holidays despite the onslaught of  food that accompanies the holidays.   Knowledge Questionnaire Score: Knowledge Questionnaire Score - 08/29/18 1511      Knowledge Questionnaire Score   Pre Score  18/18       Core Components/Risk Factors/Patient Goals at Admission: Personal Goals and Risk Factors at Admission - 09/24/18 1007      Core Components/Risk Factors/Patient Goals on Admission   Intervention  Weight Management: Provide education and appropriate resources to help participant work on and attain dietary goals.    Admit Weight  184 lb 11.9 oz (83.8 kg)    Goal Weight: Long Term  184 lb 11.9 oz (83.8 kg)       Core Components/Risk Factors/Patient Goals Review:  Goals and Risk Factor Review    Row Name 09/24/18 1010 10/22/18 0954 11/19/18 1623         Core Components/Risk Factors/Patient Goals Review   Personal Goals Review  Improve shortness of breath with ADL's;Develop more efficient breathing techniques such as purse lipped breathing and diaphragmatic breathing and practicing self-pacing with activity.;Increase knowledge of respiratory medications and ability to use respiratory devices properly.  Weight Management/Obesity;Improve shortness of breath with ADL's;Increase knowledge of respiratory medications and ability to use respiratory devices properly.;Develop more efficient breathing techniques such as purse lipped breathing and diaphragmatic breathing and practicing self-pacing with activity.  Weight Management/Obesity;Improve shortness of breath with ADL's;Increase knowledge of respiratory medications and ability to use respiratory devices properly.;Develop more efficient breathing techniques such as purse lipped breathing and diaphragmatic breathing and practicing self-pacing with activity.     Review  Just started program, has attended 4 exercise sessions, this is his second time in the program. 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  It is improving, but has been very painful.     Expected Outcomes  see admission outcomes  see admission outcomes  see admission outcomes        Core Components/Risk  Factors/Patient Goals at Discharge (Final Review):  Goals and Risk Factor Review - 11/19/18 1623      Core Components/Risk Factors/Patient Goals Review   Personal Goals Review  Weight Management/Obesity;Improve shortness of breath with ADL's;Increase knowledge of respiratory medications and ability to use respiratory devices properly.;Develop more efficient breathing techniques such as purse lipped breathing and diaphragmatic breathing and practicing self-pacing with activity.    Review  Is maintaing weight loss of 2 kg, he has had gout of one of his knees and that has prevented him from walking in the last 30 days.  It is improving, but has been very painful.    Expected Outcomes  see admission outcomes       ITP Comments: ITP Comments    Row Name 08/26/18 1354           ITP Comments  Dr. Manfred Arch, Medical Director Pulmonary Rehab          Comments: ITP REVIEW Pt is making expected progress toward pulmonary rehab goals after completing 14 sessions. Recommend continued exercise, life style modification, education, and utilization of breathing techniques to increase stamina and strength and decrease shortness of breath with exertion.

## 2018-11-19 NOTE — Progress Notes (Signed)
Daily Session Note  Patient Details  Name: Charles Archer MRN: [id number] Date of Birth: 03/20/1936 Referring Provider:     Pulmonary Rehab Walk Test from 08/29/2018 in [place]  Referring Provider  Dr. Lamonte Sakai      Encounter Date: 11/19/2018  Check In: Session Check In - 11/19/18 1330      Check-In   Supervising physician immediately available to respond to emergencies  [facility] immediately available    Physician(s)  Dr. Tawanna Solo    Location  [facility]    Staff Present  Joycelyn Man RN, BSN;Dalton Kris Mouton, MS, Exercise Physiologist;Tawny Raspberry Ysidro Evert, RN;Joan Leonia Reeves, RN, BSN    Medication changes reported      No    Fall or balance concerns reported     No    Tobacco Cessation  No Change    Warm-up and Cool-down  Performed as group-led instruction    Resistance Training Performed  Yes    VAD Patient?  No    PAD/SET Patient?  No      Pain Assessment   Currently in Pain?  No/denies    Multiple Pain Sites  No       Capillary Blood Glucose: No results found for this or any previous visit (from the past 24 hour(s)).  Exercise Prescription Changes - 11/19/18 1500      Response to Exercise   Blood Pressure (Admit)  110/64    Blood Pressure (Exercise)  108/58    Blood Pressure (Exit)  118/64    Heart Rate (Admit)  101 bpm    Heart Rate (Exercise)  110 bpm    Heart Rate (Exit)  93 bpm    Oxygen Saturation (Admit)  99 %    Oxygen Saturation (Exercise)  97 %    Oxygen Saturation (Exit)  100 %    Rating of Perceived Exertion (Exercise)  13    Perceived Dyspnea (Exercise)  3    Duration  Progress to 45 minutes of aerobic exercise without signs/symptoms of physical distress    Intensity  THRR unchanged      Progression   Progression  Continue to progress workloads to maintain intensity without signs/symptoms of physical distress.      Resistance Training   Training Prescription  Yes    Weight  orange bands    Reps   10-15    Time  10 Minutes      Oxygen   Oxygen  Continuous    Liters  6-8       NuStep   Level  4    SPM  80    Minutes  34    METs  1.9      Arm Ergometer   Level  3    RPM  80    Minutes  17       Social History   Tobacco Use  Smoking Status Former Smoker  . Packs/day: 3.00  . Years: 30.00  . Pack years: 90.00  . Types: Cigarettes  . Last attempt to quit: 09/03/1989  . Years since quitting: 29.2  Smokeless Tobacco Never Used    Goals Met:  Exercise tolerated well No report of cardiac concerns or symptoms Strength training completed today  Goals Unmet:  Not Applicable  Comments: Service time is from 1330 to 1500    Dr. Rush Farmer is Medical Director for Pulmonary Rehab at [facility].

## 2018-11-21 ENCOUNTER — Encounter (HOSPITAL_COMMUNITY): Payer: PPO

## 2018-11-21 ENCOUNTER — Telehealth (HOSPITAL_COMMUNITY): Payer: Self-pay | Admitting: Family Medicine

## 2018-11-23 DIAGNOSIS — J449 Chronic obstructive pulmonary disease, unspecified: Secondary | ICD-10-CM | POA: Diagnosis not present

## 2018-11-23 IMAGING — CT CT CHEST W/O CM
2 of 3 series · 14 of 36 positions shown, 17 images · non-contrast
Comparison: Chest radiograph February 15, 2017; chest CT January 17, 2017.
Chest CT December 24, 2013

CLINICAL DATA: Lung nodule.

EXAM:
CT CHEST WITHOUT CONTRAST
TECHNIQUE: Multidetector CT imaging of the chest was performed following the
standard protocol without IV contrast.

[Series 2: thorax · axial · 0.77mm/px · z∈[-346,-54]mm · 11 of 172 slices shown, 14 images]
[im 13/172  mediastinal]
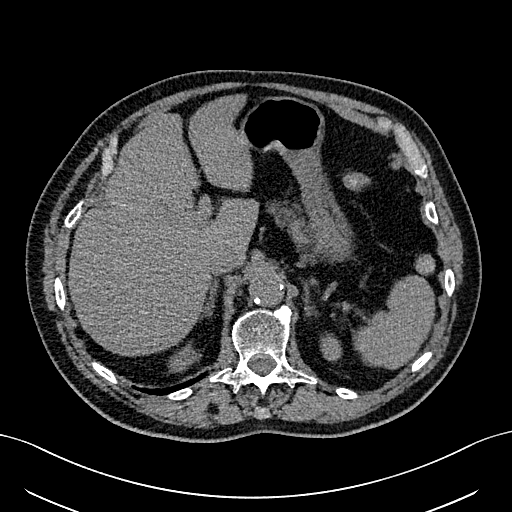
[im 13/172  lung]
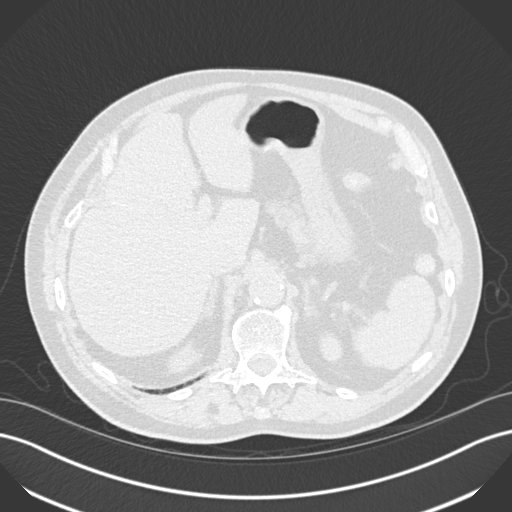
[im 26/172  lung]
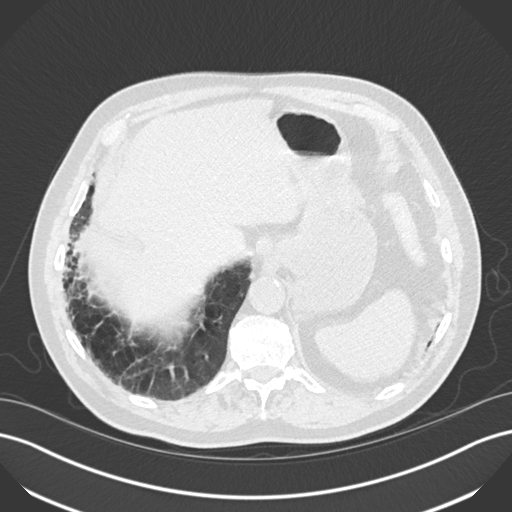
[im 39/172  lung]
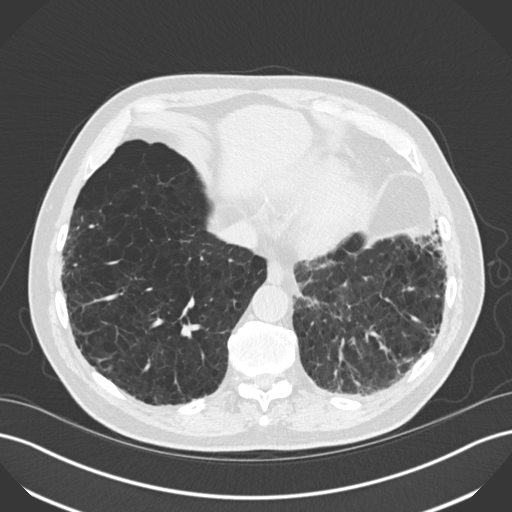
[im 58/172  lung]
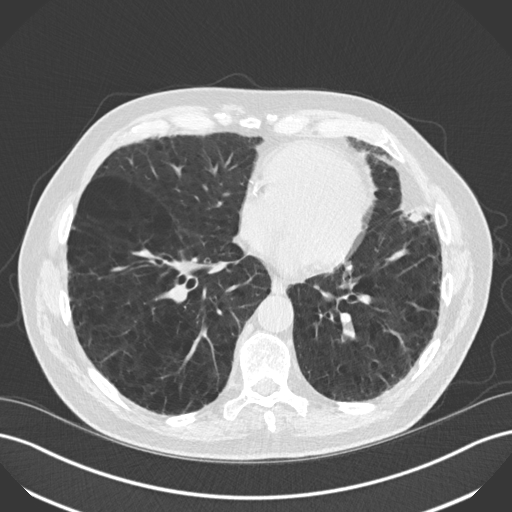
[im 70/172  mediastinal]
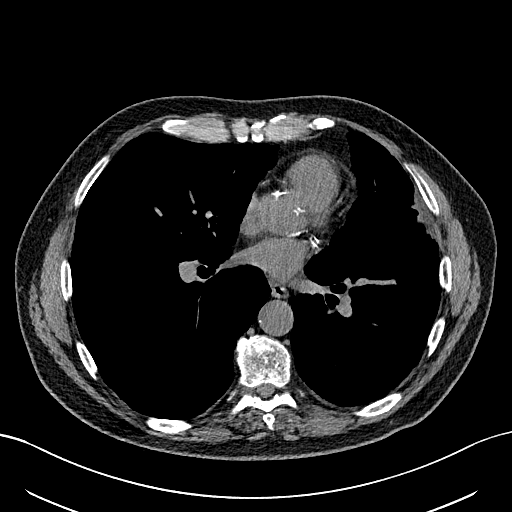
[im 70/172  lung]
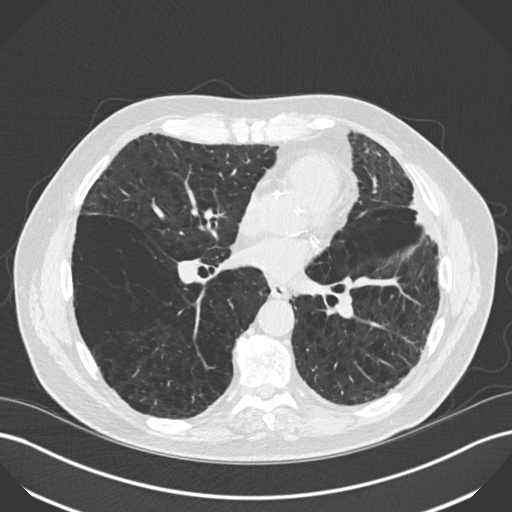
[im 89/172  lung]
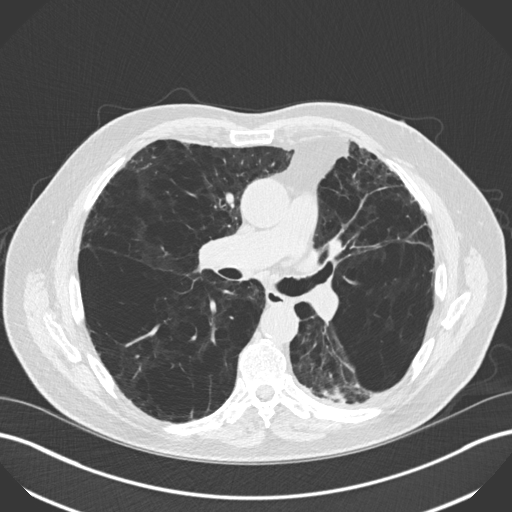
[im 102/172  lung]
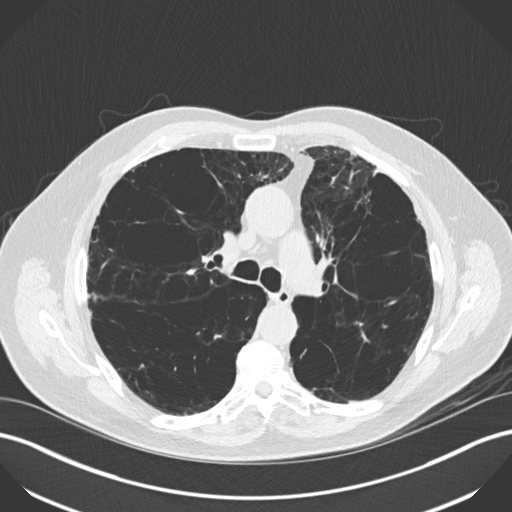
[im 115/172  lung]
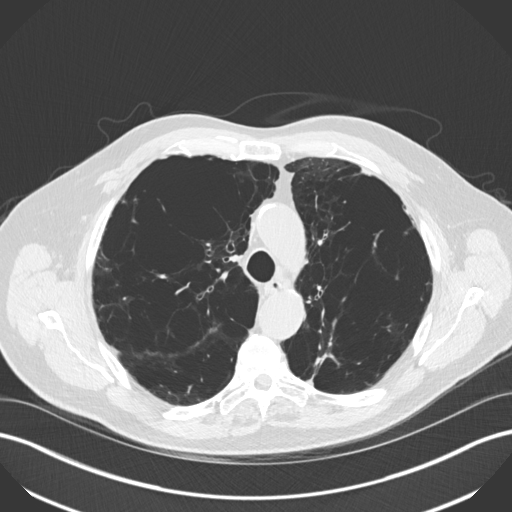
[im 134/172  mediastinal]
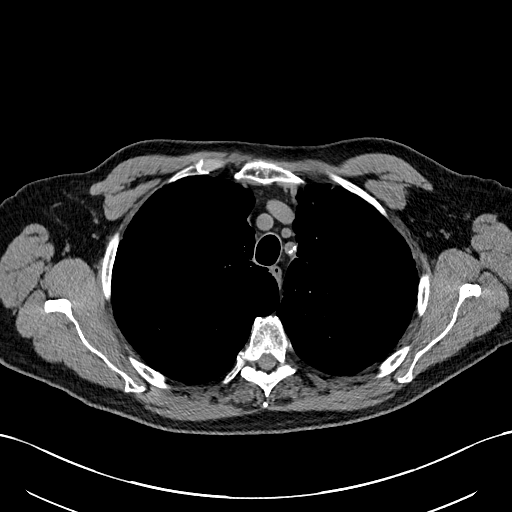
[im 134/172  lung]
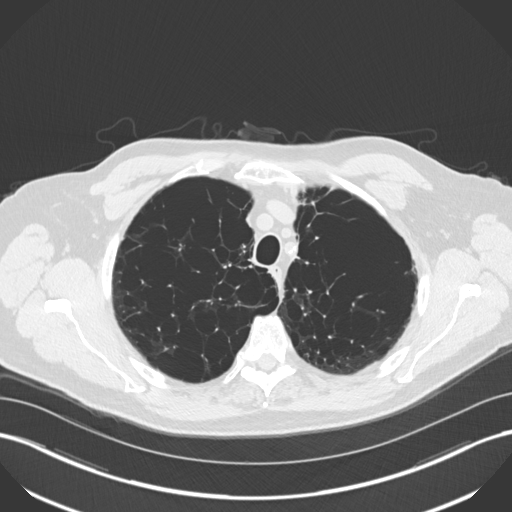
[im 146/172  lung]
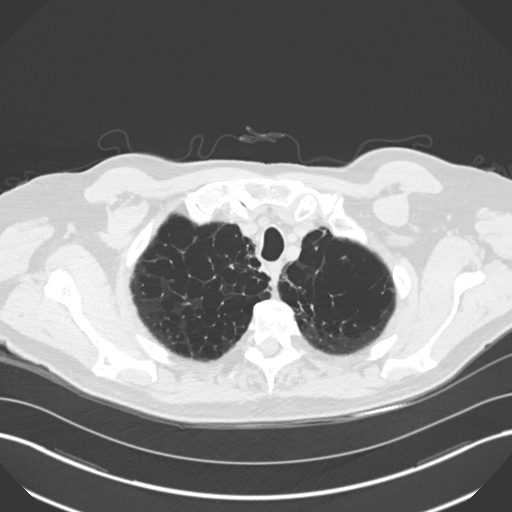
[im 159/172  lung]
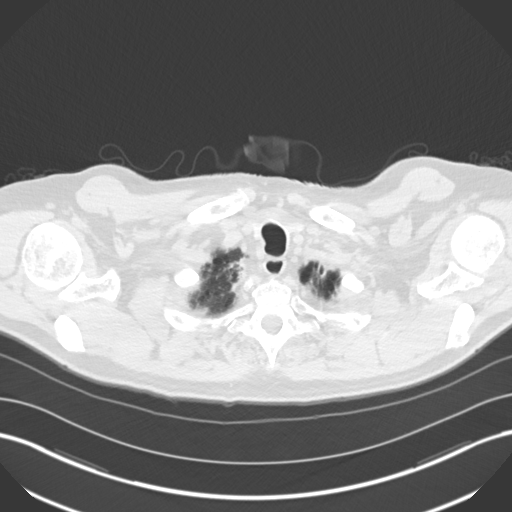

[Series 5: coronal · coronal · 0.67mm/px · 3 of 134 slices shown]
[im 27/134  lung]
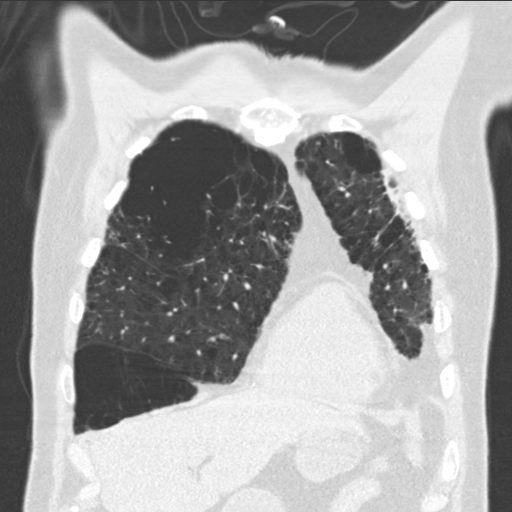
[im 54/134  lung]
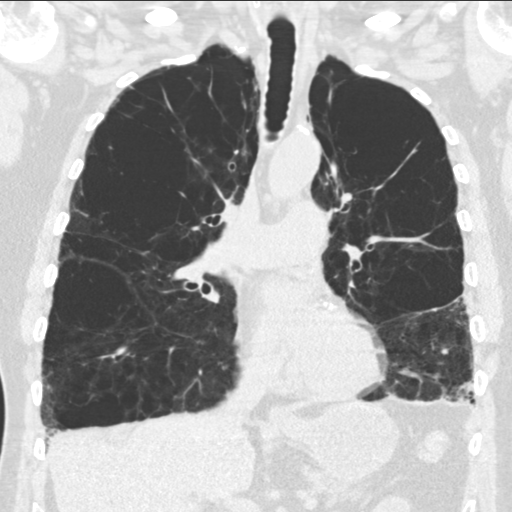
[im 80/134  lung]
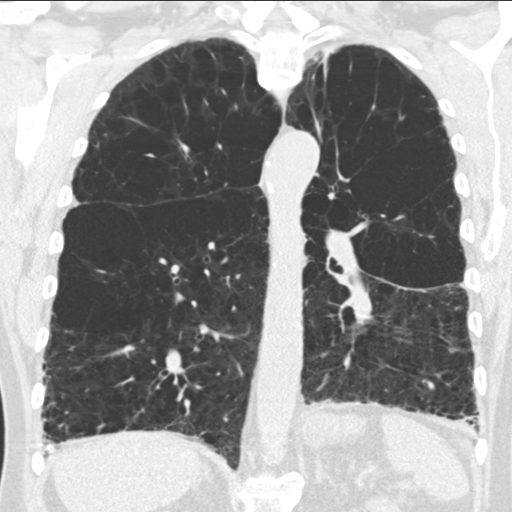

[14 of 36 positions shown; findings below may reference images not displayed]

FINDINGS: Cardiovascular: There is no abdominal aortic aneurysm. There is
calcification in the proximal right common and left subclavian
arteries with moderate obstruction in these vessels. There is also
calcification in the right bifurcation and proximal internal carotid
artery. There is calcification in the aorta. There are foci of
coronary artery calcification evident. Pericardium is not
appreciably thickened.

Mediastinum/Nodes: Visualized thyroid appears normal. There are
scattered subcentimeter mediastinal lymph nodes. By size criteria,
there is no evident adenopathy in the thorax. No esophageal lesions
are evident.

Lungs/Pleura: There is extensive bullous emphysematous change
bilaterally. Bullae occupying much of the upper lobe regions. In
comparison with the previous study, there is asymmetric left apical
scarring and pleural thickening with mild calcification along the
parietal pleura of the left apex. There is slightly less nodularity
in this area compared to prior CT.

There is fibrosis in the lung bases with a degree of traction type
bronchiectatic change in the lower lobes.

The irregular opacity abutting the pleura in the inferior lingula
noted on prior study is currently visualized and measures 4.7 x
cm. This area on the prior study measured 5.2 x 3.4 cm. There is fat
along the anterior, inferior periphery of this lesion, also noted
previously. The solid component of this lesion is seen along the
posterior aspect and currently measures 2.1 x 1.5 cm compare to
x 2.8 cm on prior study. No new lesion is seen in this area. There
is mild scarring in this area which appears essentially stable.

No new parenchymal lung lesion is evident. No pleural effusion
evident. No consolidation.

Upper Abdomen: In the visualized upper abdomen, there is
atherosclerotic calcification in the aorta and the and peripheral
mesenteric arterial vessels. Liver again is noted to have mild
contour irregularity. Visualized upper abdominal structures
otherwise appear unremarkable.

Musculoskeletal: There are no blastic or lytic bone lesions. No
chest wall lesions are evident.
IMPRESSION: 1. Widespread emphysematous change with extensive bullous disease
primarily in the upper lobes. There is fibrosis in the lower lung
zone regions with mild traction bronchiectasis. Suspect superimposed
usual interstitial pneumonitis. These changes are similar to prior
study.

2. Masslike area abutting the pleura in the inferior lingula is
overall smaller. A portion of this lesion contains fat. The solid
component of this lesion remains but is smaller. The appearance
suggests incomplete resolution of inflammation with rounded
atelectasis. As there does remain a solid component which
potentially could harbor an underlying neoplasm, continued
surveillance is felt to be warranted. A follow-up noncontrast
enhanced chest CT in 6 months to assess for stability may be
reasonable. If more aggressive surveillance is felt to be warranted,
nuclear medicine PET study could be helpful to assess for abnormal
metabolic activity in this area.

3. Slightly less nodularity in asymmetric pleural thickening in the
left apex compared to prior study. Mild parietal pleural
calcification this area is stable no new lung opacities are evident.

4.  No evident adenopathy by size criteria.

5. There is aortic atherosclerosis. There are foci of calcification
in proximal great vessels and in coronary artery regions.

6. Mild contour irregularity of the liver raises question a degree
of underlying emphysematous change.

Aortic Atherosclerosis (9CXPA-VQR.R) and Emphysema (9CXPA-JPH.Q).

## 2018-11-26 ENCOUNTER — Encounter (HOSPITAL_COMMUNITY)
Admission: RE | Admit: 2018-11-26 | Discharge: 2018-11-26 | Disposition: A | Discharge status: Home / Self Care | Payer: PPO | Source: Ambulatory Visit | Attending: Emergency Medicine | Admitting: Emergency Medicine

## 2018-11-26 DIAGNOSIS — J439 Emphysema, unspecified: Secondary | ICD-10-CM

## 2018-11-26 NOTE — Progress Notes (Signed)
Daily Session Note  Patient Details  Name: Charles Archer MRN: [id number] Date of Birth: 12-24-1935 Referring Provider:     Pulmonary Rehab Walk Test from 08/29/2018 in [place]  Referring Provider  Dr. Lamonte Sakai      Encounter Date: 11/26/2018  Check In: Session Check In - 11/26/18 1330      Check-In   Supervising physician immediately available to respond to emergencies  [facility] immediately available    Physician(s)  Dr. Broadus John    Location  [facility]    Staff Present  Rosebud Poles, RN, BSN;Molly DiVincenzo, MS, ACSM RCEP, Exercise Physiologist;Dalton Kris Mouton, MS, Exercise Physiologist;Robben Jagiello Ysidro Evert, RN    Medication changes reported      No    Fall or balance concerns reported     No    Tobacco Cessation  No Change    Warm-up and Cool-down  Performed as group-led instruction    Resistance Training Performed  Yes    VAD Patient?  No    PAD/SET Patient?  No      Pain Assessment   Currently in Pain?  No/denies    Multiple Pain Sites  No       Capillary Blood Glucose: No results found for this or any previous visit (from the past 24 hour(s)).    Social History   Tobacco Use  Smoking Status Former Smoker  . Packs/day: 3.00  . Years: 30.00  . Pack years: 90.00  . Types: Cigarettes  . Last attempt to quit: 09/03/1989  . Years since quitting: 29.2  Smokeless Tobacco Never Used    Goals Met:  Exercise tolerated well No report of cardiac concerns or symptoms Strength training completed today  Goals Unmet:  Not Applicable  Comments: Service time is from 1330 to 1500    Dr. Rush Farmer is Medical Director for Pulmonary Rehab at [facility].

## 2018-11-28 ENCOUNTER — Encounter (HOSPITAL_COMMUNITY)
Admission: RE | Admit: 2018-11-28 | Discharge: 2018-11-28 | Disposition: A | Discharge status: Home / Self Care | Payer: PPO | Source: Ambulatory Visit | Attending: Emergency Medicine | Admitting: Emergency Medicine

## 2018-11-28 DIAGNOSIS — J439 Emphysema, unspecified: Secondary | ICD-10-CM

## 2018-11-28 DIAGNOSIS — J449 Chronic obstructive pulmonary disease, unspecified: Secondary | ICD-10-CM | POA: Diagnosis not present

## 2018-11-28 NOTE — Progress Notes (Signed)
Daily Session Note  Patient Details  Name: Charles Archer MRN: [id number] Date of Birth: Apr 15, 1936 Referring Provider:     Pulmonary Rehab Walk Test from 08/29/2018 in [place]  Referring Provider  Dr. Lamonte Sakai      Encounter Date: 11/28/2018  Check In: Session Check In - 11/28/18 1351      Check-In   Supervising physician immediately available to respond to emergencies  [facility] immediately available    Physician(s)  Dr. Berle Mull    Location  [facility]    Staff Present  Rosebud Poles, RN, BSN;Molly DiVincenzo, MS, ACSM RCEP, Exercise Physiologist;Dalton Kris Mouton, MS, Exercise Physiologist;Lisa Ysidro Evert, RN;Carlette Carlton, RN, BSN    Medication changes reported      No    Fall or balance concerns reported     No    Tobacco Cessation  No Change    Warm-up and Cool-down  Performed as group-led instruction    Resistance Training Performed  Yes    VAD Patient?  No    PAD/SET Patient?  No      Pain Assessment   Currently in Pain?  No/denies    Multiple Pain Sites  No       Capillary Blood Glucose: No results found for this or any previous visit (from the past 24 hour(s)).    Social History   Tobacco Use  Smoking Status Former Smoker  . Packs/day: 3.00  . Years: 30.00  . Pack years: 90.00  . Types: Cigarettes  . Last attempt to quit: 09/03/1989  . Years since quitting: 29.2  Smokeless Tobacco Never Used    Goals Met:  Proper associated with RPD/PD & O2 Sat Exercise tolerated well Strength training completed today  Goals Unmet:  Not Applicable  Comments: Service time is from 1330 to 1525    Dr. Rush Farmer is Medical Director for Pulmonary Rehab at [facility].

## 2018-12-03 ENCOUNTER — Encounter (HOSPITAL_COMMUNITY)
Admission: RE | Admit: 2018-12-03 | Discharge: 2018-12-03 | Disposition: A | Discharge status: Home / Self Care | Payer: PPO | Source: Ambulatory Visit | Attending: Emergency Medicine | Admitting: Emergency Medicine

## 2018-12-03 VITALS — Wt 177.0 lb

## 2018-12-03 DIAGNOSIS — J439 Emphysema, unspecified: Secondary | ICD-10-CM

## 2018-12-03 NOTE — Progress Notes (Signed)
Daily Session Note  Patient Details  Name: Charles Archer MRN: [id number] Date of Birth: 29-May-1936 Referring Provider:     Pulmonary Rehab Walk Test from 08/29/2018 in [place]  Referring Provider  Dr. Lamonte Sakai      Encounter Date: 12/03/2018  Check In: Session Check In - 12/03/18 1414      Check-In   Supervising physician immediately available to respond to emergencies  [facility] immediately available    Physician(s)  Dr. Berle Mull    Location  [facility]    Staff Present  Rosebud Poles, RN, BSN;Carlette Carlton, RN, BSN;Molly DiVincenzo, MS, ACSM RCEP, Exercise Physiologist;Dalton Kris Mouton, MS, Exercise Physiologist;Lisa Ysidro Evert, RN    Medication changes reported      No    Fall or balance concerns reported     No    Tobacco Cessation  No Change    Warm-up and Cool-down  Performed as group-led instruction    Resistance Training Performed  Yes    VAD Patient?  No    PAD/SET Patient?  No      Pain Assessment   Currently in Pain?  No/denies    Pain Score  0-No pain    Multiple Pain Sites  No       Capillary Blood Glucose: No results found for this or any previous visit (from the past 24 hour(s)).  Exercise Prescription Changes - 12/03/18 1500      Response to Exercise   Blood Pressure (Admit)  104/64    Blood Pressure (Exercise)  104/70    Blood Pressure (Exit)  98/54    Heart Rate (Admit)  115 bpm    Heart Rate (Exercise)  110 bpm    Heart Rate (Exit)  99 bpm    Oxygen Saturation (Admit)  95 %    Oxygen Saturation (Exercise)  95 %    Oxygen Saturation (Exit)  100 %    Rating of Perceived Exertion (Exercise)  13    Perceived Dyspnea (Exercise)  3    Duration  Progress to 45 minutes of aerobic exercise without signs/symptoms of physical distress    Intensity  THRR unchanged      Progression   Progression  Continue to progress workloads to maintain intensity without signs/symptoms of physical distress.       Resistance Training   Training Prescription  Yes    Weight  orange bands    Reps  10-15    Time  10 Minutes      Oxygen   Oxygen  Continuous    Liters  6-10      NuStep   Level  4    SPM  80    Minutes  34    METs  1.9      Arm Ergometer   Level  3    RPM  80    Minutes  17       Social History   Tobacco Use  Smoking Status Former Smoker  . Packs/day: 3.00  . Years: 30.00  . Pack years: 90.00  . Types: Cigarettes  . Last attempt to quit: 09/03/1989  . Years since quitting: 29.2  Smokeless Tobacco Never Used    Goals Met:  Proper associated with RPD/PD & O2 Sat Exercise tolerated well Strength training completed today  Goals Unmet:  Not Applicable  Comments: Service time is from 1330 to 1500    Dr. Rush Farmer is Medical Director for Pulmonary  Rehab at [facility].

## 2018-12-04 ENCOUNTER — Encounter (HOSPITAL_COMMUNITY): Payer: Self-pay | Admitting: *Deleted

## 2018-12-05 ENCOUNTER — Encounter (HOSPITAL_COMMUNITY): Payer: PPO

## 2018-12-05 ENCOUNTER — Telehealth (HOSPITAL_COMMUNITY): Payer: Self-pay | Admitting: Family Medicine

## 2018-12-10 ENCOUNTER — Encounter (HOSPITAL_COMMUNITY)
Admission: RE | Admit: 2018-12-10 | Discharge: 2018-12-10 | Disposition: A | Discharge status: Home / Self Care | Payer: PPO | Source: Ambulatory Visit | Attending: Emergency Medicine | Admitting: Emergency Medicine

## 2018-12-10 DIAGNOSIS — J439 Emphysema, unspecified: Secondary | ICD-10-CM | POA: Diagnosis not present

## 2018-12-10 NOTE — Progress Notes (Signed)
Daily Session Note  Patient Details  Name: Charles Archer MRN: [id number] Date of Birth: December 10, 1935 Referring Provider:     Pulmonary Rehab Walk Test from 08/29/2018 in [place]  Referring Provider  Dr. Lamonte Sakai      Encounter Date: 12/10/2018  Check In: Session Check In - 12/10/18 1554      Check-In   Supervising physician immediately available to respond to emergencies  [facility] immediately available    Physician(s)  Dr. Herbert Moors    Location  [facility]    Staff Present  Rosebud Poles, RN, BSN;Molly DiVincenzo, MS, ACSM RCEP, Exercise Physiologist;Dalton Kris Mouton, MS, Exercise Physiologist;Other    Medication changes reported      No    Fall or balance concerns reported     No    Tobacco Cessation  No Change    Warm-up and Cool-down  Performed as group-led instruction    Resistance Training Performed  Yes    VAD Patient?  No    PAD/SET Patient?  No      Pain Assessment   Currently in Pain?  No/denies    Pain Score  0-No pain    Multiple Pain Sites  No       Capillary Blood Glucose: No results found for this or any previous visit (from the past 24 hour(s)).    Social History   Tobacco Use  Smoking Status Former Smoker  . Packs/day: 3.00  . Years: 30.00  . Pack years: 90.00  . Types: Cigarettes  . Last attempt to quit: 09/03/1989  . Years since quitting: 29.2  Smokeless Tobacco Never Used    Goals Met:  Proper associated with RPD/PD & O2 Sat Exercise tolerated well Strength training completed today  Goals Unmet:  Not Applicable  Comments: Service time is from 1330 to 1500    Dr. Rush Farmer is Medical Director for Pulmonary Rehab at [facility].

## 2018-12-12 ENCOUNTER — Encounter (HOSPITAL_COMMUNITY)
Admission: RE | Admit: 2018-12-12 | Discharge: 2018-12-12 | Disposition: A | Discharge status: Home / Self Care | Payer: PPO | Source: Ambulatory Visit | Attending: Emergency Medicine | Admitting: Emergency Medicine

## 2018-12-12 DIAGNOSIS — J439 Emphysema, unspecified: Secondary | ICD-10-CM | POA: Diagnosis not present

## 2018-12-17 ENCOUNTER — Telehealth: Payer: Self-pay | Admitting: Emergency Medicine

## 2018-12-17 DIAGNOSIS — J439 Emphysema, unspecified: Secondary | ICD-10-CM

## 2018-12-17 NOTE — Telephone Encounter (Signed)
RB ok to send order for pulmonary rehab maintenance program. Can we send order?

## 2018-12-19 NOTE — Telephone Encounter (Signed)
Order placed. I called Misty Stanley to advise her that the referral was placed but there was no answer. I left a detailed message advising her that the order was placed and to call if she needed anything else from our end. I will close this encounter.

## 2018-12-19 NOTE — Telephone Encounter (Signed)
Yes this is OK 

## 2018-12-22 DIAGNOSIS — J449 Chronic obstructive pulmonary disease, unspecified: Secondary | ICD-10-CM | POA: Diagnosis not present

## 2018-12-27 DIAGNOSIS — J449 Chronic obstructive pulmonary disease, unspecified: Secondary | ICD-10-CM | POA: Diagnosis not present

## 2018-12-30 ENCOUNTER — Other Ambulatory Visit: Payer: Self-pay | Admitting: Emergency Medicine

## 2018-12-31 ENCOUNTER — Encounter (HOSPITAL_COMMUNITY): Payer: Self-pay | Admitting: *Deleted

## 2018-12-31 NOTE — Progress Notes (Signed)
I contacted Tirso today to inform that the Pulmonary Maintenance program has been cancelled for the next two weeks. He voices understanding. I informed Eyas that we would be in contact with him with further updates.

## 2019-01-06 NOTE — Addendum Note (Signed)
Encounter addended by: Drema Pry, RN on: 01/06/2019 2:22 PM  Actions taken: Flowsheet data copied forward, Visit Navigator Flowsheet section accepted, Clinical Note Signed, Episode resolved

## 2019-01-06 NOTE — Progress Notes (Signed)
Discharge Progress Report  Patient Details  Name: Charles Archer MRN: [id number] Date of Birth: 01-27-1936 Referring Provider:     Pulmonary Rehab Walk Test from 08/29/2018 in [place]  Referring Provider  Dr. Lamonte Sakai       Number of Visits: 19  Reason for Discharge:  Patient reached a stable level of exercise. Patient independent in their exercise. Patient has met program and personal goals.  Smoking History:  Social History   Tobacco Use  Smoking Status Former Smoker  . Packs/day: 3.00  . Years: 30.00  . Pack years: 90.00  . Types: Cigarettes  . Last attempt to quit: 09/03/1989  . Years since quitting: 29.3  Smokeless Tobacco Never Used    Diagnosis:  Pulmonary emphysema, unspecified emphysema type ([place])  ADL UCSD: Pulmonary Assessment Scores    Row Name 08/29/18 1509 08/29/18 1630 12/04/18 1522     ADL [facility]   ADL Phase  Entry  Entry  Exit   SOB Score total  89  -  97     CAT Score   CAT Score  pre 18  -  24     mMRC Score   mMRC Score  -  3  -   Row Name 12/13/18 1018         ADL [facility]   ADL Phase  Exit       mMRC Score   mMRC Score  4        Initial Exercise Prescription: Initial Exercise Prescription - 08/29/18 1600      Date of Initial Exercise RX and Referring Provider   Date  08/29/18    Referring Provider  Dr. Lamonte Sakai      Oxygen   Oxygen  Continuous    Liters  15      NuStep   Level  2    SPM  80    Minutes  17      Arm Ergometer   Level  1.5    RPM  80    Minutes  17      Track   Laps  7    Minutes  17      Prescription Details   Frequency (times per week)  2    Duration  Progress to 45 minutes of aerobic exercise without signs/symptoms of physical distress      Intensity   THRR 40-80% of Max Heartrate  56-112    Ratings of Perceived Exertion  11-13    Perceived Dyspnea  0-4      Progression   Progression  Continue to progress workloads to maintain intensity without signs/symptoms of  physical distress.      Resistance Training   Training Prescription  Yes    Weight  orange bands    Reps  10-15       Discharge Exercise Prescription (Final Exercise Prescription Changes): Exercise Prescription Changes - 12/03/18 1500      Response to Exercise   Blood Pressure (Admit)  104/64    Blood Pressure (Exercise)  104/70    Blood Pressure (Exit)  98/54    Heart Rate (Admit)  115 bpm    Heart Rate (Exercise)  110 bpm    Heart Rate (Exit)  99 bpm    Oxygen Saturation (Admit)  95 %    Oxygen Saturation (Exercise)  95 %    Oxygen Saturation (Exit)  100 %    Rating of Perceived Exertion (Exercise)  13  Perceived Dyspnea (Exercise)  3    Duration  Progress to 45 minutes of aerobic exercise without signs/symptoms of physical distress    Intensity  THRR unchanged      Progression   Progression  Continue to progress workloads to maintain intensity without signs/symptoms of physical distress.      Resistance Training   Training Prescription  Yes    Weight  orange bands    Reps  10-15    Time  10 Minutes      Oxygen   Oxygen  Continuous    Liters  6-10      NuStep   Level  4    SPM  80    Minutes  34    METs  1.9      Arm Ergometer   Level  3    RPM  80    Minutes  17       Functional Capacity: 6 Minute Walk    Row Name 08/29/18 1633 12/13/18 1015       6 Minute Walk   Phase  Initial  Discharge    Distance  615 feet  800 feet    Distance Feet Change  -  185 ft    Walk Time  3.75 minutes  - 4 minutes 10 seconds    # of Rest Breaks  2  2 1  minute 50 seconds total seated    MPH  1.16  -    METS  1.92  2.15    RPE  13  15    Perceived Dyspnea   3  2    Symptoms  Yes (comment)  Yes (comment)    Comments  On 6 L pt desaturated to 84%. Following at 1.5 minute break the pt was bumped up to 8 L, desaturated, then bumped up to 10 L. At 10 L the pt desaturated again and took a seated rest break for the last 45 seconds. The pt was put on 15 L for the remainder  of time and saturation levels rose back to acceptable levels.   used wheel chair    Resting HR  104 bpm  112 bpm    Resting BP  106/82  106/60    Resting Oxygen Saturation   98 %  98 %    Exercise Oxygen Saturation  during 6 min walk  84 %  92 %    Max Ex. HR  127 bpm  124 bpm    Max Ex. BP  130/76  138/60    2 Minute Post BP  110/68  -      Interval HR   1 Minute HR  117  111    2 Minute HR  118  123    3 Minute HR  110  124    4 Minute HR  118  124    5 Minute HR  121  122    6 Minute HR  127  122    2 Minute Post HR  114  115    Interval Heart Rate?  Yes  Yes      Interval Oxygen   Interval Oxygen?  Yes  Yes    Baseline Oxygen Saturation %  98 %  98 %    1 Minute Oxygen Saturation %  95 %  96 %    1 Minute Liters of Oxygen  6 L  10 L    2 Minute Oxygen Saturation %  84 %  92 %    2 Minute Liters of Oxygen  6 L  10 L    3 Minute Oxygen Saturation %  86 %  92 %    3 Minute Liters of Oxygen  8 L  10 L    4 Minute Oxygen Saturation %  97 %  92 %    4 Minute Liters of Oxygen  10 L  10 L    5 Minute Oxygen Saturation %  90 %  94 %    5 Minute Liters of Oxygen  10 L  10 L    6 Minute Oxygen Saturation %  84 %  94 %    6 Minute Liters of Oxygen  10 L  10 L    2 Minute Post Oxygen Saturation %  99 %  95 %    2 Minute Post Liters of Oxygen  15 L  10 L       Psychological, QOL, Others - Outcomes: PHQ 2/9: Depression screen [facility] 2/9 12/12/2018 08/26/2018 05/28/2017  Decreased Interest 0 0 0  Down, Depressed, Hopeless 0 0 0  PHQ - 2 Score 0 0 0  Altered sleeping 1 0 0  Tired, decreased energy 1 2 0  Change in appetite 1 0 0  Feeling bad or failure about yourself  0 0 0  Trouble concentrating 0 0 0  Moving slowly or fidgety/restless 0 0 0  Suicidal thoughts 0 0 0  PHQ-9 Score 3 2 0  Difficult doing work/chores Not difficult at all Not difficult at all Not difficult at all    Quality of Life:   Personal Goals: Goals established at orientation with interventions provided  to work toward goal. Personal Goals and Risk Factors at Admission - 09/24/18 1007      Core Components/Risk Factors/Patient Goals on Admission   Intervention  Weight Management: Provide education and appropriate resources to help participant work on and attain dietary goals.    Admit Weight  184 lb 11.9 oz (83.8 kg)    Goal Weight: Long Term  184 lb 11.9 oz (83.8 kg)        Personal Goals Discharge: Goals and Risk Factor Review    Row Name 09/24/18 1010 10/22/18 0954 11/19/18 1623 12/12/18 1459 01/06/19 1413     Core Components/Risk Factors/Patient Goals Review   Personal Goals Review  Improve shortness of breath with ADL's;Develop more efficient breathing techniques such as purse lipped breathing and diaphragmatic breathing and practicing self-pacing with activity.;Increase knowledge of respiratory medications and ability to use respiratory devices properly.  Weight Management/Obesity;Improve shortness of breath with ADL's;Increase knowledge of respiratory medications and ability to use respiratory devices properly.;Develop more efficient breathing techniques such as purse lipped breathing and diaphragmatic breathing and practicing self-pacing with activity.  Weight Management/Obesity;Improve shortness of breath with ADL's;Increase knowledge of respiratory medications and ability to use respiratory devices properly.;Develop more efficient breathing techniques such as purse lipped breathing and diaphragmatic breathing and practicing self-pacing with activity.  Weight Management/Obesity;Improve shortness of breath with ADL's;Increase knowledge of respiratory medications and ability to use respiratory devices properly.;Develop more efficient breathing techniques such as purse lipped breathing and diaphragmatic breathing and practicing self-pacing with activity.  Weight Management/Obesity;Improve shortness of breath with ADL's;Increase knowledge of respiratory medications and ability to use respiratory  devices properly.;Develop more efficient breathing techniques such as purse lipped breathing and diaphragmatic breathing and practicing self-pacing with activity.   Review  Just started program, has attended 4 exercise sessions, this is his  second time in the program. Level 3 on nustep, level 2 on arm ergometer, 8 laps on track  Has lost 2 kg, will counsel on weight maintenance, progressing well with workloads on machines, very happy to be back in the program  Is maintaing weight loss of 2 kg, he has had gout of one of his knees and that has prevented him from walking in the last 30 days.  It is improving, but has been very painful.  Pt graduates today 2/27 with     exercise sessions completed.  Pt plans to exercise 2-3 days a week and golfing with using cart  Lost 3 kg, was consistent with attendance, had an episode of gout in his knee and this kept him from performing standing and walking exercise, but was able to continue with seated exercise.  He plans on joining the pulmonary rmaintenance exercise class when the COVID-19 quarantine is over.   Expected Outcomes  see admission outcomes  see admission outcomes  see admission outcomes  -  He did meet his program goals.      Exercise Goals and Review: Exercise Goals    Row Name 08/26/18 1406             Exercise Goals   Increase Physical Activity  Yes       Intervention  Provide advice, education, support and counseling about physical activity/exercise needs.;Develop an individualized exercise prescription for aerobic and resistive training based on initial evaluation findings, risk stratification, comorbidities and participant's personal goals.       Expected Outcomes  Short Term: Attend rehab on a regular basis to increase amount of physical activity.;Long Term: Exercising regularly at least 3-5 days a week.;Long Term: Add in home exercise to make exercise part of routine and to increase amount of physical activity.       Increase Strength and  Stamina  Yes       Intervention  Provide advice, education, support and counseling about physical activity/exercise needs.;Develop an individualized exercise prescription for aerobic and resistive training based on initial evaluation findings, risk stratification, comorbidities and participant's personal goals.       Expected Outcomes  Short Term: Increase workloads from initial exercise prescription for resistance, speed, and METs.;Short Term: Perform resistance training exercises routinely during rehab and add in resistance training at home;Long Term: Improve cardiorespiratory fitness, muscular endurance and strength as measured by increased METs and functional capacity (6MWT)       Able to understand and use rate of perceived exertion (RPE) scale  Yes       Intervention  Provide education and explanation on how to use RPE scale       Expected Outcomes  Short Term: Able to use RPE daily in rehab to express subjective intensity level;Long Term:  Able to use RPE to guide intensity level when exercising independently       Able to understand and use Dyspnea scale  Yes       Intervention  Provide education and explanation on how to use Dyspnea scale       Expected Outcomes  Short Term: Able to use Dyspnea scale daily in rehab to express subjective sense of shortness of breath during exertion;Long Term: Able to use Dyspnea scale to guide intensity level when exercising independently       Knowledge and understanding of Target Heart Rate Range (THRR)  Yes       Intervention  Provide education and explanation of THRR including how the numbers were  predicted and where they are located for reference       Expected Outcomes  Short Term: Able to state/look up THRR;Long Term: Able to use THRR to govern intensity when exercising independently;Short Term: Able to use daily as guideline for intensity in rehab       Understanding of Exercise Prescription  Yes       Intervention  Provide education, explanation, and  written materials on patient's individual exercise prescription       Expected Outcomes  Short Term: Able to explain program exercise prescription;Long Term: Able to explain home exercise prescription to exercise independently          Exercise Goals Re-Evaluation: Exercise Goals Re-Evaluation    Row Name 09/25/18 0802 10/22/18 0856 11/18/18 0722         Exercise Goal Re-Evaluation   Exercise Goals Review  Increase Physical Activity;Increase Strength and Stamina;Able to understand and use rate of perceived exertion (RPE) scale;Able to understand and use Dyspnea scale;Knowledge and understanding of Target Heart Rate Range (THRR);Understanding of Exercise Prescription  Increase Physical Activity;Increase Strength and Stamina;Able to understand and use rate of perceived exertion (RPE) scale;Able to understand and use Dyspnea scale;Knowledge and understanding of Target Heart Rate Range (THRR);Understanding of Exercise Prescription  Increase Physical Activity;Increase Strength and Stamina;Able to understand and use rate of perceived exertion (RPE) scale;Able to understand and use Dyspnea scale;Knowledge and understanding of Target Heart Rate Range (THRR);Understanding of Exercise Prescription     Comments  Pt has completed 5 exercise sessions. He has been open to workload increases and is very motivated. Pt can walk 8 laps in 15 minutes around track (1 lap=200 ft). He exercises at low MET levels ~2.0. I expect progress to be slow. Will continue to monitor and progress as able.   Pt is progressing well. Pt has recently been hindered by a hurt knee. Pt continues to exercise at low MET levels (2.0). Will continue to monitor and progress as able.   Patient is progressing slow and steady. His MET level falls in lower level. He is hindered by knee pain and a need for a high liter flow when he exercises. He routinely perceives his exercise as somewhat hard. Will cont. to monitor and progress as able and encourage  home exercise.     Expected Outcomes  Through exercise at rehab and at home, patient will increase strength and stamina making ADL's easier to perform. Patient will also have a better understanding of safe exercise and what they are capable to do outside of clinical supervision.  -  Through exercise at rehab, patient will increase physical capacity, decrease shortness of breath, and feel confident in implementing and exercise routine at home. Also, when patient completes their graduation 6MWT they will increase their walk test distance by atleast 100 feet.         Nutrition & Weight - Outcomes:    Nutrition: Nutrition Therapy & Goals - 10/15/18 1523      Nutrition Therapy   Diet  Heart Healthy      Personal Nutrition Goals   Nutrition Goal  Describe the benefit of including fruits, vegetables, whole grains, and low-fat dairy products in a healthy meal plan.    Personal Goal #2  eat reguarly across the day, 5-6 small meals/snacks    Personal Goal #3  The pt will recognize symptoms that can interfere with adequate oral intake      Intervention Plan   Intervention  Prescribe, educate and counsel regarding individualized  specific dietary modifications aiming towards targeted core components such as weight, hypertension, lipid management, diabetes, heart failure and other comorbidities.    Expected Outcomes  Short Term Goal: Understand basic principles of dietary content, such as calories, fat, sodium, cholesterol and nutrients.;Long Term Goal: Adherence to prescribed nutrition plan.       Nutrition Discharge: Nutrition Assessments - 12/06/18 0828      Rate Your Plate Scores   Pre Score  45    Post Score  38       Education Questionnaire Score: Knowledge Questionnaire Score - 12/04/18 1521      Knowledge Questionnaire Score   Post Score  17/18       Goals reviewed with patient; copy given to patient.

## 2019-01-09 ENCOUNTER — Telehealth (HOSPITAL_COMMUNITY): Payer: Self-pay | Admitting: *Deleted

## 2019-01-14 DIAGNOSIS — J449 Chronic obstructive pulmonary disease, unspecified: Secondary | ICD-10-CM | POA: Diagnosis not present

## 2019-01-14 DIAGNOSIS — M179 Osteoarthritis of knee, unspecified: Secondary | ICD-10-CM | POA: Diagnosis not present

## 2019-01-14 DIAGNOSIS — N183 Chronic kidney disease, stage 3 (moderate): Secondary | ICD-10-CM | POA: Diagnosis not present

## 2019-01-14 DIAGNOSIS — I10 Essential (primary) hypertension: Secondary | ICD-10-CM | POA: Diagnosis not present

## 2019-01-14 DIAGNOSIS — N400 Enlarged prostate without lower urinary tract symptoms: Secondary | ICD-10-CM | POA: Diagnosis not present

## 2019-01-22 DIAGNOSIS — J449 Chronic obstructive pulmonary disease, unspecified: Secondary | ICD-10-CM | POA: Diagnosis not present

## 2019-01-22 DIAGNOSIS — N183 Chronic kidney disease, stage 3 (moderate): Secondary | ICD-10-CM | POA: Diagnosis not present

## 2019-01-22 DIAGNOSIS — I10 Essential (primary) hypertension: Secondary | ICD-10-CM | POA: Diagnosis not present

## 2019-01-22 DIAGNOSIS — M179 Osteoarthritis of knee, unspecified: Secondary | ICD-10-CM | POA: Diagnosis not present

## 2019-01-22 DIAGNOSIS — J324 Chronic pansinusitis: Secondary | ICD-10-CM | POA: Diagnosis not present

## 2019-01-22 DIAGNOSIS — N400 Enlarged prostate without lower urinary tract symptoms: Secondary | ICD-10-CM | POA: Diagnosis not present

## 2019-01-27 DIAGNOSIS — J449 Chronic obstructive pulmonary disease, unspecified: Secondary | ICD-10-CM | POA: Diagnosis not present

## 2019-01-28 ENCOUNTER — Other Ambulatory Visit: Payer: Self-pay | Admitting: Emergency Medicine

## 2019-01-28 ENCOUNTER — Telehealth (HOSPITAL_COMMUNITY): Payer: Self-pay

## 2019-02-21 DIAGNOSIS — J449 Chronic obstructive pulmonary disease, unspecified: Secondary | ICD-10-CM | POA: Diagnosis not present

## 2019-03-12 DIAGNOSIS — N183 Chronic kidney disease, stage 3 (moderate): Secondary | ICD-10-CM | POA: Diagnosis not present

## 2019-03-12 DIAGNOSIS — J449 Chronic obstructive pulmonary disease, unspecified: Secondary | ICD-10-CM | POA: Diagnosis not present

## 2019-03-12 DIAGNOSIS — M179 Osteoarthritis of knee, unspecified: Secondary | ICD-10-CM | POA: Diagnosis not present

## 2019-03-12 DIAGNOSIS — I10 Essential (primary) hypertension: Secondary | ICD-10-CM | POA: Diagnosis not present

## 2019-03-12 DIAGNOSIS — N400 Enlarged prostate without lower urinary tract symptoms: Secondary | ICD-10-CM | POA: Diagnosis not present

## 2019-04-14 ENCOUNTER — Ambulatory Visit: Payer: PPO | Admitting: Emergency Medicine

## 2019-04-28 ENCOUNTER — Other Ambulatory Visit: Payer: Self-pay | Admitting: Emergency Medicine

## 2019-04-29 ENCOUNTER — Other Ambulatory Visit: Payer: Self-pay | Admitting: *Deleted

## 2019-04-29 ENCOUNTER — Telehealth: Payer: Self-pay | Admitting: Emergency Medicine

## 2019-04-29 MED ORDER — IPRATROPIUM-ALBUTEROL 0.5-2.5 (3) MG/3ML IN SOLN: RESPIRATORY_TRACT | 2 refills | Status: DC

## 2019-04-29 NOTE — Telephone Encounter (Signed)
Patient sent a e mail/my chart message  this morning requesting Duo neb refill.  Refill sent to requested [facility].  Responded to Patient through e mail/ my chart message.  Nothing further at this time.

## 2019-05-07 NOTE — Telephone Encounter (Signed)
Pt is scheduled for an office visit with Danielson Monday, 7/27. Pt stated that his daughter usually comes with him and goes back with him for appts due to memory issues pt has. Pt wants to make sure it is okay for his daughter to still come back with him when he arrives for the appt Monday. Dr. Lamonte Sakai, please advise on this for pt. Thanks!

## 2019-05-07 NOTE — Telephone Encounter (Signed)
Good question --  I think the safest strategy would be to have her conference call in with [place] during the visit so that she can here the recs, ask questions. If Mr Goyne and his daughter feel that this wouldn't be adequate, then I would be Ok with her accompanying him into the visit.

## 2019-05-08 ENCOUNTER — Ambulatory Visit: Payer: PPO | Admitting: Emergency Medicine

## 2019-05-12 ENCOUNTER — Encounter: Payer: Self-pay | Admitting: Emergency Medicine

## 2019-05-12 ENCOUNTER — Other Ambulatory Visit: Payer: Self-pay

## 2019-05-12 ENCOUNTER — Ambulatory Visit (INDEPENDENT_AMBULATORY_CARE_PROVIDER_SITE_OTHER): Payer: PPO | Admitting: Emergency Medicine

## 2019-05-12 DIAGNOSIS — J9611 Chronic respiratory failure with hypoxia: Secondary | ICD-10-CM | POA: Diagnosis not present

## 2019-05-12 DIAGNOSIS — J449 Chronic obstructive pulmonary disease, unspecified: Secondary | ICD-10-CM | POA: Diagnosis not present

## 2019-05-12 NOTE — Assessment & Plan Note (Signed)
With significant desaturations with exertion even on 4 to 5 L/min continuous when he is at home.  When he exerts at pulmonary rehab they have him sometimes up as high as 10 to 15 L/min.  I think we need to consider transition to an Oxymizer at least at home and is on his continuous flow concentrator to optimize saturations.  He may also need to consider changing from a pulsed portable system to a continuous flow portable system.  We will work with Huey Romans to assess his oxygen needs and then come up with the best solution, acknowledging that if the systems to have a he will be able to use it at all.

## 2019-05-12 NOTE — Patient Instructions (Signed)
Please continue DuoNeb 4 times a day as you have been using it. Keep albuterol available use 2 puffs if needed for shortness of breath, chest tightness, wheezing. Continue your oxygen 5 to 6 L/min.  We will need to assess to see if there is a more efficient and adequate system for you to use at least with your home concentrator to optimize oxygen delivery.  We may have to consider changing from a pulsed portable system to a continuous flow portable system depending on your oxygen levels. Fl;u shot in the fall Follow with Dr. Lamonte Sakai in 3 months or sooner if you have any problems.

## 2019-05-12 NOTE — Progress Notes (Signed)
Subjective:    Patient ID: Charles Archer, male    DOB: [birth year], 83 y.o.   MRN: [id number]  COPD He complains of cough. There is no shortness of breath or wheezing. Pertinent negatives include no ear pain, fever, headaches, postnasal drip, rhinorrhea, sneezing, sore throat or trouble swallowing. His past medical history is significant for COPD.   83 yo former smoker (90 pk-yrs), hx of HTN, hyperlipemia. Has [facility]. Stress testing and L heart cath done 09/23/13 and 10/22/13 > non-critical CAD, overall reassuring.  Also history GERD, rhinitis  PFT 12/28/13  >> moderate AFL, no BD response, normal volumes, significantly decreased DLCO.  No new sx, still with exertional SOB. No flares. Last time exertional hypoxemia.    ROV 01/25/18 --[name] is a pleasant 83 year old gentleman who follows up today for his COPD, allergic rhinitis, chronic cough.  He also has an area of inferior lingular rounded atelectasis that we have followed with serial imaging, was getting smaller.  He has hypoxemia and is currently on oxygen at 3-4L/min.  Since last time he has done pulmonary rehab and feels that it has helped his functional capacity. He has had a a bit more trouble for the last month, some increase SOB with exertion. No real change in drainage, cough. No wheeze. He uses scheduled DuoNeb. Hardly ever uses ProAir.   ROV 05/12/2019 --83 year old man with COPD, allergic rhinitis, chronic cough, lingular rounded atelectasis that was getting smaller on serial CT scans.  He has hypoxemic respiratory failure oxygen 3 to 4 L/min.  He has been managed on DuoNeb on a schedule, uses albuterol approximately.  He completed another round of pulmonary rehab in late February, is interested in the maintenance program whenever this is back up to speed. Exertional SOB. No cough or wheze. He notes desaturations to the mid-60's even when wearing continuous flow o2 6L/min at home. desats on pulsed  system portable.    Review of Systems  Constitutional: Negative for fever and unexpected weight change.  HENT: Positive for congestion. Negative for dental problem, ear pain, nosebleeds, postnasal drip, rhinorrhea, sinus pressure, sneezing, sore throat and trouble swallowing.   Eyes: Negative for redness and itching.  Respiratory: Positive for cough. Negative for chest tightness, shortness of breath and wheezing.   Cardiovascular: Negative for palpitations and leg swelling.  Gastrointestinal: Negative for abdominal distention, nausea and vomiting.  Genitourinary: Negative for dysuria.  Musculoskeletal: Negative for joint swelling.  Skin: Negative for rash.  Neurological: Negative for headaches.  Hematological: Does not bruise/bleed easily.  Psychiatric/Behavioral: Negative for dysphoric mood. The patient is not nervous/anxious.        Objective:   Physical Exam Vitals:   05/12/19 1507  BP: 122/76  Pulse: 95  SpO2: 98%  Weight: 174 lb (78.9 kg)  Height: 5\' 11"  (1.803 m)   Gen: Pleasant, thin, well-nourished, in no distress,  normal affect  ENT: No lesions,  mouth clear,  oropharynx clear, no postnasal drip  Neck: No JVD, no stridor  Lungs: No use of accessory muscles, clear without rales or rhonchi, he does have some wheeze on a forced expiration.   Cardiovascular: RRR, heart sounds normal, no murmur or gallops, no peripheral edema  Musculoskeletal: No deformities, no cyanosis or clubbing  Neuro: alert, non focal  Skin: Warm, no lesions or rashes      Assessment & Plan:  Hypoxemic respiratory failure, chronic (HCC) With significant desaturations with exertion even on 4 to 5 L/min continuous  when he is at home.  When he exerts at pulmonary rehab they have him sometimes up as high as 10 to 15 L/min.  I think we need to consider transition to an Oxymizer at least at home and is on his continuous flow concentrator to optimize saturations.  He may also need to consider  changing from a pulsed portable system to a continuous flow portable system.  We will work with Huey Romans to assess his oxygen needs and then come up with the best solution, acknowledging that if the systems to have a he will be able to use it at all.  COPD (chronic obstructive pulmonary disease) Continue DuoNeb as scheduled Albuterol as needed Flu shot in the fall  [name], MD, PhD 05/12/2019, 3:21 PM [facility] [phone] or if no answer [phone]

## 2019-05-12 NOTE — Assessment & Plan Note (Signed)
Continue DuoNeb as scheduled Albuterol as needed Flu shot in the fall

## 2019-05-29 DIAGNOSIS — J449 Chronic obstructive pulmonary disease, unspecified: Secondary | ICD-10-CM | POA: Diagnosis not present

## 2019-06-04 DIAGNOSIS — J449 Chronic obstructive pulmonary disease, unspecified: Secondary | ICD-10-CM | POA: Diagnosis not present

## 2019-06-04 DIAGNOSIS — I10 Essential (primary) hypertension: Secondary | ICD-10-CM | POA: Diagnosis not present

## 2019-06-04 DIAGNOSIS — Z0000 Encounter for general adult medical examination without abnormal findings: Secondary | ICD-10-CM | POA: Diagnosis not present

## 2019-06-04 DIAGNOSIS — M109 Gout, unspecified: Secondary | ICD-10-CM | POA: Diagnosis not present

## 2019-06-04 DIAGNOSIS — N183 Chronic kidney disease, stage 3 (moderate): Secondary | ICD-10-CM | POA: Diagnosis not present

## 2019-06-04 DIAGNOSIS — E7800 Pure hypercholesterolemia, unspecified: Secondary | ICD-10-CM | POA: Diagnosis not present

## 2019-06-04 DIAGNOSIS — L570 Actinic keratosis: Secondary | ICD-10-CM | POA: Diagnosis not present

## 2019-06-04 DIAGNOSIS — H6120 Impacted cerumen, unspecified ear: Secondary | ICD-10-CM | POA: Diagnosis not present

## 2019-06-04 DIAGNOSIS — N400 Enlarged prostate without lower urinary tract symptoms: Secondary | ICD-10-CM | POA: Diagnosis not present

## 2019-07-14 DIAGNOSIS — Z23 Encounter for immunization: Secondary | ICD-10-CM | POA: Diagnosis not present

## 2019-07-24 DIAGNOSIS — J449 Chronic obstructive pulmonary disease, unspecified: Secondary | ICD-10-CM | POA: Diagnosis not present

## 2019-07-25 ENCOUNTER — Other Ambulatory Visit: Payer: Self-pay | Admitting: Emergency Medicine

## 2019-07-25 ENCOUNTER — Telehealth: Payer: Self-pay | Admitting: Emergency Medicine

## 2019-07-25 MED ORDER — IPRATROPIUM-ALBUTEROL 0.5-2.5 (3) MG/3ML IN SOLN: RESPIRATORY_TRACT | 5 refills | Status: DC

## 2019-07-25 MED ORDER — ALBUTEROL SULFATE HFA 108 (90 BASE) MCG/ACT IN AERS: 2.0000 | INHALATION_SPRAY | Freq: Four times a day (QID) | RESPIRATORY_TRACT | 5 refills | Status: AC | PRN

## 2019-07-25 NOTE — Telephone Encounter (Signed)
LMTCB  [facility] as [facility] is not a pharmacy under his profile. (orders have been pended, will send once pharmacy has been verified).

## 2019-07-25 NOTE — Telephone Encounter (Signed)
Pharmacy verified when patient called back. Refills sent. Nothing further needed at this time.

## 2019-07-25 NOTE — Telephone Encounter (Signed)
Pt states he wants both prescriptions sent to [facility] at [name].  Please advise. [phone]/

## 2019-07-29 DIAGNOSIS — E7800 Pure hypercholesterolemia, unspecified: Secondary | ICD-10-CM | POA: Diagnosis not present

## 2019-07-29 DIAGNOSIS — M179 Osteoarthritis of knee, unspecified: Secondary | ICD-10-CM | POA: Diagnosis not present

## 2019-07-29 DIAGNOSIS — N400 Enlarged prostate without lower urinary tract symptoms: Secondary | ICD-10-CM | POA: Diagnosis not present

## 2019-07-29 DIAGNOSIS — N183 Chronic kidney disease, stage 3 unspecified: Secondary | ICD-10-CM | POA: Diagnosis not present

## 2019-07-29 DIAGNOSIS — I10 Essential (primary) hypertension: Secondary | ICD-10-CM | POA: Diagnosis not present

## 2019-07-29 DIAGNOSIS — J449 Chronic obstructive pulmonary disease, unspecified: Secondary | ICD-10-CM | POA: Diagnosis not present

## 2019-08-04 ENCOUNTER — Encounter: Payer: Self-pay | Admitting: Emergency Medicine

## 2019-08-04 ENCOUNTER — Ambulatory Visit: Payer: PPO | Admitting: Emergency Medicine

## 2019-08-04 ENCOUNTER — Other Ambulatory Visit: Payer: Self-pay

## 2019-08-04 VITALS — BP 120/72

## 2019-08-04 DIAGNOSIS — J9611 Chronic respiratory failure with hypoxia: Secondary | ICD-10-CM

## 2019-08-04 DIAGNOSIS — J449 Chronic obstructive pulmonary disease, unspecified: Secondary | ICD-10-CM | POA: Diagnosis not present

## 2019-08-04 NOTE — Assessment & Plan Note (Signed)
Severe disease with significant limitations, principally emphysematous disease with hypoxemia and frequent desaturation with exertion.  No exacerbations since last time.   Continue your DuoNeb 4 times a day on a schedule. Keep your albuterol available to use 2 puffs if you needed for shortness of breath, chest tightness, wheezing. Flu shot and pneumonia shot are both up-to-date We will consider going back to pulmonary rehab in the future once isolation requirements have been loosened. Follow with Dr Lamonte Sakai in 3 months or sooner if you have any problems.

## 2019-08-04 NOTE — Assessment & Plan Note (Signed)
He will require 4 to 6 L/min even with standard activity at home, he required up to 10 L/min when he was doing pulmonary rehab.  His [employer] device is not going above 3.5 L/min, needs to be replaced.  We will work on getting you a new home concentrator through [place]. You will need to wear 4 to 6 L/min depending on your degree of activity.  Try using your Oxymizer nasal cannula as this may deliver the oxygen more effectively, efficiently and prevent you from having episodes of low saturation

## 2019-08-04 NOTE — Patient Instructions (Signed)
We will work on getting you a new home concentrator through [place]. You will need to wear 4 to 6 L/min depending on your degree of activity.  Try using your Oxymizer nasal cannula as this may deliver the oxygen more effectively, efficiently and prevent you from having episodes of low saturation Continue your DuoNeb 4 times a day on a schedule. Keep your albuterol available to use 2 puffs if you needed for shortness of breath, chest tightness, wheezing. Flu shot and pneumonia shot are both up-to-date We will consider going back to pulmonary rehab in the future once isolation requirements have been loosened. Follow with Dr Lamonte Sakai in 3 months or sooner if you have any problems.

## 2019-08-04 NOTE — Progress Notes (Signed)
Subjective:    Patient ID: Caralee Ates, male    DOB: 1936/02/09, 83 y.o.   MRN: [id number]  COPD He complains of cough. There is no shortness of breath or wheezing. Pertinent negatives include no ear pain, fever, headaches, postnasal drip, rhinorrhea, sneezing, sore throat or trouble swallowing. His past medical history is significant for COPD.    ROV 05/12/2019 --83 year old man with COPD, allergic rhinitis, chronic cough, lingular rounded atelectasis that was getting smaller on serial CT scans.  He has hypoxemic respiratory failure oxygen 3 to 4 L/min.  He has been managed on DuoNeb on a schedule, uses albuterol approximately.  He completed another round of pulmonary rehab in late February, is interested in the maintenance program whenever this is back up to speed. Exertional SOB. No cough or wheze. He notes desaturations to the mid-60's even when wearing continuous flow o2 6L/min at home. desats on pulsed system portable.   ROV 08/04/2019 --follow-up visit for 83 year old gentleman with COPD, allergic rhinitis, chronic cough and lingular rounded atelectasis that we followed with serial CT scans, decreasing in size.  Also hypoxemic respiratory failure for which he uses   L/min.  Current bronchodilator regimen is DuoNeb qid. Still has desats and exertional SOB. Still wants to get to maintenance pulm rehab when safe to do so. Rare albuterol. Minimal wheeze or cough.    Review of Systems  Constitutional: Negative for fever and unexpected weight change.  HENT: Positive for congestion. Negative for dental problem, ear pain, nosebleeds, postnasal drip, rhinorrhea, sinus pressure, sneezing, sore throat and trouble swallowing.   Eyes: Negative for redness and itching.  Respiratory: Positive for cough. Negative for chest tightness, shortness of breath and wheezing.   Cardiovascular: Negative for palpitations and leg swelling.  Gastrointestinal: Negative for abdominal distention, nausea and vomiting.   Genitourinary: Negative for dysuria.  Musculoskeletal: Negative for joint swelling.  Skin: Negative for rash.  Neurological: Negative for headaches.  Hematological: Does not bruise/bleed easily.  Psychiatric/Behavioral: Negative for dysphoric mood. The patient is not nervous/anxious.        Objective:   Physical Exam Vitals:   08/04/19 1331  BP: 120/72  SpO2: 96%   Gen: Pleasant, thin, well-nourished, in no distress,  normal affect  ENT: No lesions,  mouth clear,  oropharynx clear, no postnasal drip  Neck: No JVD, no stridor  Lungs: No use of accessory muscles, clear without rales or rhonchi, no wheeze today on a regular breath or forced expiration, very distant  Cardiovascular: RRR, heart sounds normal, no murmur or gallops, no peripheral edema  Musculoskeletal: No deformities, no cyanosis or clubbing  Neuro: alert, non focal  Skin: Warm, no lesions or rashes      Assessment & Plan:  COPD (chronic obstructive pulmonary disease) Severe disease with significant limitations, principally emphysematous disease with hypoxemia and frequent desaturation with exertion.  No exacerbations since last time.   Continue your DuoNeb 4 times a day on a schedule. Keep your albuterol available to use 2 puffs if you needed for shortness of breath, chest tightness, wheezing. Flu shot and pneumonia shot are both up-to-date We will consider going back to pulmonary rehab in the future once isolation requirements have been loosened. Follow with Dr Delton Coombes in 3 months or sooner if you have any problems.  Hypoxemic respiratory failure, chronic (HCC) He will require 4 to 6 L/min even with standard activity at home, he required up to 10 L/min when he was doing pulmonary rehab.  His [employer] device is  not going above 3.5 L/min, needs to be replaced.  We will work on getting you a new home concentrator through [place]. You will need to wear 4 to 6 L/min depending on your degree of activity.  Try using  your Oxymizer nasal cannula as this may deliver the oxygen more effectively, efficiently and prevent you from having episodes of low saturation  [name], MD, PhD 08/04/2019, 1:49 PM [facility] [phone] or if no answer [phone]

## 2019-08-24 DIAGNOSIS — E7800 Pure hypercholesterolemia, unspecified: Secondary | ICD-10-CM | POA: Diagnosis not present

## 2019-08-24 DIAGNOSIS — N400 Enlarged prostate without lower urinary tract symptoms: Secondary | ICD-10-CM | POA: Diagnosis not present

## 2019-08-24 DIAGNOSIS — M179 Osteoarthritis of knee, unspecified: Secondary | ICD-10-CM | POA: Diagnosis not present

## 2019-08-24 DIAGNOSIS — J449 Chronic obstructive pulmonary disease, unspecified: Secondary | ICD-10-CM | POA: Diagnosis not present

## 2019-08-24 DIAGNOSIS — N183 Chronic kidney disease, stage 3 unspecified: Secondary | ICD-10-CM | POA: Diagnosis not present

## 2019-08-24 DIAGNOSIS — I10 Essential (primary) hypertension: Secondary | ICD-10-CM | POA: Diagnosis not present

## 2019-09-23 DIAGNOSIS — J449 Chronic obstructive pulmonary disease, unspecified: Secondary | ICD-10-CM | POA: Diagnosis not present

## 2019-10-14 ENCOUNTER — Encounter: Payer: Self-pay | Admitting: Emergency Medicine

## 2019-10-14 ENCOUNTER — Ambulatory Visit (INDEPENDENT_AMBULATORY_CARE_PROVIDER_SITE_OTHER): Payer: PPO | Admitting: Emergency Medicine

## 2019-10-14 ENCOUNTER — Other Ambulatory Visit: Payer: Self-pay

## 2019-10-14 DIAGNOSIS — J9611 Chronic respiratory failure with hypoxia: Secondary | ICD-10-CM | POA: Diagnosis not present

## 2019-10-14 DIAGNOSIS — J449 Chronic obstructive pulmonary disease, unspecified: Secondary | ICD-10-CM

## 2019-10-14 MED ORDER — FLUTICASONE PROPIONATE 50 MCG/ACT NA SUSP: 2.0000 | Freq: Two times a day (BID) | NASAL | 5 refills | Status: DC

## 2019-10-14 MED ORDER — BUDESONIDE 0.5 MG/2ML IN SUSP: 0.5000 mg | Freq: Two times a day (BID) | RESPIRATORY_TRACT | 3 refills | Status: DC

## 2019-10-14 MED ORDER — FLUTICASONE PROPIONATE 50 MCG/ACT NA SUSP: NASAL | 5 refills | Status: DC

## 2019-10-14 NOTE — Patient Instructions (Addendum)
Please continue your DuoNeb 4 times a day on a schedule. We will add budesonide (Pulmicort) nebulizer treatments twice a day.  Rinse and gargle after using. Keep your albuterol available use 2 puffs up to every 4 hours if needed for shortness of breath, chest tightness, wheezing. Try to continue your fluticasone nasal spray 2 sprays each nostril once daily.  We will write a new prescription for this. Continue your oxygen at 4 to 6 L/min as you have been using it. Flu shot and pneumonia shot are up-to-date. You will be a good candidate for the COVID-19 vaccine when it becomes available We will try to refer you back to pulmonary rehab when the exposure risk decreases. Follow with Dr Lamonte Sakai in 4 months or sooner if you have any problems.

## 2019-10-14 NOTE — Assessment & Plan Note (Signed)
Continue your oxygen at 4 to 6 L/min as you have been using it.

## 2019-10-14 NOTE — Progress Notes (Signed)
Subjective:    Patient ID: Charles Archer, male    DOB: June 16, 1936, 83 y.o.   MRN: [id number]  COPD He complains of cough. There is no shortness of breath or wheezing. Pertinent negatives include no ear pain, fever, headaches, postnasal drip, rhinorrhea, sneezing, sore throat or trouble swallowing. His past medical history is significant for COPD.    ROV 05/12/2019 --83 year old man with COPD, allergic rhinitis, chronic cough, lingular rounded atelectasis that was getting smaller on serial CT scans.  He has hypoxemic respiratory failure oxygen 3 to 4 L/min.  He has been managed on DuoNeb on a schedule, uses albuterol approximately.  He completed another round of pulmonary rehab in late February, is interested in the maintenance program whenever this is back up to speed. Exertional SOB. No cough or wheze. He notes desaturations to the mid-60's even when wearing continuous flow o2 6L/min at home. desats on pulsed system portable.   ROV 08/04/2019 --follow-up visit for 83 year old gentleman with COPD, allergic rhinitis, chronic cough and lingular rounded atelectasis that we followed with serial CT scans, decreasing in size.  Also hypoxemic respiratory failure for which he uses   L/min.  Current bronchodilator regimen is DuoNeb qid. Still has desats and exertional SOB. Still wants to get to maintenance pulm rehab when safe to do so. Rare albuterol. Minimal wheeze or cough.   ROV 10/14/2019 --[name] is 62, has a history of COPD, chronic cough with allergic rhinitis, chronic hypoxic respiratory failure.  We have also been following an area of lingular rounded atelectasis that has decreased in size on serial CT scans.  At his last visit we continued DuoNeb and titrated his home oxygen need to 4 to 6 L/min.  He needed a new home concentrator through [place] in order to accomplish this.  When he was at pulmonary rehab he also needed 10 L/min with significant exertion. He is having more congestion and cough w  clear mucous, may be worse in the am. He is on flonase, has some throat clearing and hoarseness.   Review of Systems  Constitutional: Negative for fever and unexpected weight change.  HENT: Positive for congestion. Negative for dental problem, ear pain, nosebleeds, postnasal drip, rhinorrhea, sinus pressure, sneezing, sore throat and trouble swallowing.   Eyes: Negative for redness and itching.  Respiratory: Positive for cough. Negative for chest tightness, shortness of breath and wheezing.   Cardiovascular: Negative for palpitations and leg swelling.  Gastrointestinal: Negative for abdominal distention, nausea and vomiting.  Genitourinary: Negative for dysuria.  Musculoskeletal: Negative for joint swelling.  Skin: Negative for rash.  Neurological: Negative for headaches.  Hematological: Does not bruise/bleed easily.  Psychiatric/Behavioral: Negative for dysphoric mood. The patient is not nervous/anxious.        Objective:   Physical Exam Vitals:   10/14/19 1357  BP: 124/66  Pulse: 99  Temp: 98.7 F (37.1 C)  TempSrc: Temporal  SpO2: 96%  Weight: 173 lb (78.5 kg)  Height: 5\' 11"  (1.803 m)   Gen: Pleasant, thin, well-nourished, in no distress,  normal affect  ENT: No lesions,  mouth clear,  oropharynx clear, no postnasal drip  Neck: No JVD, no stridor  Lungs: No use of accessory muscles, clear without rales or rhonchi, no wheeze today on a regular breath or forced expiration, very distant  Cardiovascular: RRR, heart sounds normal, no murmur or gallops, no peripheral edema  Musculoskeletal: No deformities, no cyanosis or clubbing  Neuro: alert, non focal  Skin: Warm, no lesions or rashes  Assessment & Plan:  COPD (chronic obstructive pulmonary disease) Please continue your DuoNeb 4 times a day on a schedule. We will add budesonide (Pulmicort) nebulizer treatments twice a day.  Rinse and gargle after using. Keep your albuterol available use 2 puffs up to every 4  hours if needed for shortness of breath, chest tightness, wheezing. Try to continue your fluticasone nasal spray 2 sprays each nostril once daily.  We will write a new prescription for this. Flu shot and pneumonia shot are up-to-date. You will be a good candidate for the COVID-19 vaccine when it becomes available We will try to refer you back to pulmonary rehab when the exposure risk decreases. Follow with Dr Delton Coombes in 4 months or sooner if you have any problems.  Hypoxemic respiratory failure, chronic (HCC) Continue your oxygen at 4 to 6 L/min as you have been using it.  [name], MD, PhD 10/14/2019, 2:25 PM [facility] [phone] or if no answer [phone]

## 2019-10-14 NOTE — Assessment & Plan Note (Signed)
Please continue your DuoNeb 4 times a day on a schedule. We will add budesonide (Pulmicort) nebulizer treatments twice a day.  Rinse and gargle after using. Keep your albuterol available use 2 puffs up to every 4 hours if needed for shortness of breath, chest tightness, wheezing. Try to continue your fluticasone nasal spray 2 sprays each nostril once daily.  We will write a new prescription for this. Flu shot and pneumonia shot are up-to-date. You will be a good candidate for the COVID-19 vaccine when it becomes available We will try to refer you back to pulmonary rehab when the exposure risk decreases. Follow with Dr Lamonte Sakai in 4 months or sooner if you have any problems.

## 2019-10-20 DIAGNOSIS — I10 Essential (primary) hypertension: Secondary | ICD-10-CM | POA: Diagnosis not present

## 2019-10-20 DIAGNOSIS — N400 Enlarged prostate without lower urinary tract symptoms: Secondary | ICD-10-CM | POA: Diagnosis not present

## 2019-10-20 DIAGNOSIS — J449 Chronic obstructive pulmonary disease, unspecified: Secondary | ICD-10-CM | POA: Diagnosis not present

## 2019-10-20 DIAGNOSIS — N183 Chronic kidney disease, stage 3 unspecified: Secondary | ICD-10-CM | POA: Diagnosis not present

## 2019-10-20 DIAGNOSIS — M179 Osteoarthritis of knee, unspecified: Secondary | ICD-10-CM | POA: Diagnosis not present

## 2019-10-20 DIAGNOSIS — E7800 Pure hypercholesterolemia, unspecified: Secondary | ICD-10-CM | POA: Diagnosis not present

## 2019-11-24 ENCOUNTER — Ambulatory Visit: Payer: PPO | Attending: Internal Medicine

## 2019-11-24 DIAGNOSIS — Z23 Encounter for immunization: Secondary | ICD-10-CM | POA: Insufficient documentation

## 2019-11-24 NOTE — Progress Notes (Signed)
   Covid-19 Vaccination Clinic  Name:  Charles Archer    MRN: [id number] DOB: Nov 19, 1935  11/24/2019  [name] was observed post Covid-19 immunization for 15 minutes without incidence. He was provided with Vaccine Information Sheet and instruction to access the V-Safe system.   [name] was instructed to call 911 with any severe reactions post vaccine: Marland Kitchen Difficulty breathing  . Swelling of your face and throat  . A fast heartbeat  . A bad rash all over your body  . Dizziness and weakness    Immunizations Administered    Name Date Dose VIS Date Route   Pfizer COVID-19 Vaccine 11/24/2019  1:41 PM 0.3 mL 09/26/2019 Intramuscular   Manufacturer: [employer], [employer]   Lot: [id number]   NDC: [address]-2

## 2019-11-26 DIAGNOSIS — N400 Enlarged prostate without lower urinary tract symptoms: Secondary | ICD-10-CM | POA: Diagnosis not present

## 2019-11-26 DIAGNOSIS — J449 Chronic obstructive pulmonary disease, unspecified: Secondary | ICD-10-CM | POA: Diagnosis not present

## 2019-11-26 DIAGNOSIS — N183 Chronic kidney disease, stage 3 unspecified: Secondary | ICD-10-CM | POA: Diagnosis not present

## 2019-11-26 DIAGNOSIS — E7800 Pure hypercholesterolemia, unspecified: Secondary | ICD-10-CM | POA: Diagnosis not present

## 2019-11-26 DIAGNOSIS — I10 Essential (primary) hypertension: Secondary | ICD-10-CM | POA: Diagnosis not present

## 2019-11-26 DIAGNOSIS — M179 Osteoarthritis of knee, unspecified: Secondary | ICD-10-CM | POA: Diagnosis not present

## 2019-12-12 ENCOUNTER — Ambulatory Visit: Payer: PPO

## 2019-12-16 DIAGNOSIS — J449 Chronic obstructive pulmonary disease, unspecified: Secondary | ICD-10-CM | POA: Diagnosis not present

## 2019-12-16 DIAGNOSIS — N400 Enlarged prostate without lower urinary tract symptoms: Secondary | ICD-10-CM | POA: Diagnosis not present

## 2019-12-16 DIAGNOSIS — M179 Osteoarthritis of knee, unspecified: Secondary | ICD-10-CM | POA: Diagnosis not present

## 2019-12-16 DIAGNOSIS — E7800 Pure hypercholesterolemia, unspecified: Secondary | ICD-10-CM | POA: Diagnosis not present

## 2019-12-16 DIAGNOSIS — N183 Chronic kidney disease, stage 3 unspecified: Secondary | ICD-10-CM | POA: Diagnosis not present

## 2019-12-16 DIAGNOSIS — I10 Essential (primary) hypertension: Secondary | ICD-10-CM | POA: Diagnosis not present

## 2019-12-18 ENCOUNTER — Ambulatory Visit: Payer: PPO | Attending: Internal Medicine

## 2019-12-18 DIAGNOSIS — Z23 Encounter for immunization: Secondary | ICD-10-CM | POA: Insufficient documentation

## 2019-12-18 NOTE — Progress Notes (Signed)
   Covid-19 Vaccination Clinic  Name:  Charles Archer    MRN: [id number] DOB: [birth year]  12/18/2019  Charles Archer was observed post Covid-19 immunization for 15 minutes without incident. He was provided with Vaccine Information Sheet and instruction to access the V-Safe system.   Charles Archer was instructed to call 911 with any severe reactions post vaccine: Marland Kitchen Difficulty breathing  . Swelling of face and throat  . A fast heartbeat  . A bad rash all over body  . Dizziness and weakness   Immunizations Administered    Name Date Dose VIS Date Route   Pfizer COVID-19 Vaccine 12/18/2019  6:48 PM 0.3 mL 09/26/2019 Intramuscular   Manufacturer: [employer], [employer]   Lot: [id number]   NDC: [address]-2

## 2020-01-22 DIAGNOSIS — J449 Chronic obstructive pulmonary disease, unspecified: Secondary | ICD-10-CM | POA: Diagnosis not present

## 2020-02-01 ENCOUNTER — Other Ambulatory Visit: Payer: Self-pay | Admitting: Emergency Medicine

## 2020-02-11 ENCOUNTER — Ambulatory Visit: Payer: PPO | Admitting: Primary Care

## 2020-02-13 ENCOUNTER — Other Ambulatory Visit: Payer: Self-pay

## 2020-02-13 ENCOUNTER — Encounter: Payer: Self-pay | Admitting: Emergency Medicine

## 2020-02-13 ENCOUNTER — Ambulatory Visit: Payer: PPO | Admitting: Emergency Medicine

## 2020-02-13 VITALS — BP 120/50 | HR 101 | Temp 97.8°F | Ht 71.0 in | Wt 172.2 lb

## 2020-02-13 DIAGNOSIS — I10 Essential (primary) hypertension: Secondary | ICD-10-CM | POA: Diagnosis not present

## 2020-02-13 DIAGNOSIS — E7800 Pure hypercholesterolemia, unspecified: Secondary | ICD-10-CM | POA: Diagnosis not present

## 2020-02-13 DIAGNOSIS — N183 Chronic kidney disease, stage 3 unspecified: Secondary | ICD-10-CM | POA: Diagnosis not present

## 2020-02-13 DIAGNOSIS — M179 Osteoarthritis of knee, unspecified: Secondary | ICD-10-CM | POA: Diagnosis not present

## 2020-02-13 DIAGNOSIS — J439 Emphysema, unspecified: Secondary | ICD-10-CM | POA: Diagnosis not present

## 2020-02-13 DIAGNOSIS — N400 Enlarged prostate without lower urinary tract symptoms: Secondary | ICD-10-CM | POA: Diagnosis not present

## 2020-02-13 DIAGNOSIS — J449 Chronic obstructive pulmonary disease, unspecified: Secondary | ICD-10-CM

## 2020-02-13 NOTE — Patient Instructions (Signed)
Please continue your Pulmicort nebs twice a day Please continue your DuoNeb 4 times a day on a schedule Keep albuterol available to use either 1 nebulizer treatment or 2 puffs if you need it for shortness of breath, chest tightness, wheezing. Continue your oxygen at 4 to 5 L/min with exertion. We will refer you for pulmonary rehabilitation Covid vaccine is up-to-date Follow with Dr Delton Coombes in 3 months or sooner if you have any problems.

## 2020-02-13 NOTE — Progress Notes (Signed)
Subjective:    Patient ID: Charles Archer, male    DOB: [birth year], 84 y.o.   MRN: [id number]  COPD He complains of cough. There is no shortness of breath or wheezing. Pertinent negatives include no ear pain, fever, headaches, postnasal drip, rhinorrhea, sneezing, sore throat or trouble swallowing. His past medical history is significant for COPD.    ROV 10/14/2019 --Charles Archer is 89, has a history of COPD, chronic cough with allergic rhinitis, chronic hypoxic respiratory failure.  We have also been following an area of lingular rounded atelectasis that has decreased in size on serial CT scans.  At his last visit we continued DuoNeb and titrated his home oxygen need to 4 to 6 L/min.  He needed a new home concentrator through [place] in order to accomplish this.  When he was at pulmonary rehab he also needed 10 L/min with significant exertion. He is having more congestion and cough w clear mucous, may be worse in the am. He is on flonase, has some throat clearing and hoarseness.  ROV 02/13/20 --follow-up visit for 84 year old gentleman with history of severe COPD with associated chronic hypoxemic respiratory failure.  He uses 4 to 6 L/min, his needed is much 10 L/min with significant exertion when he was doing pulmonary rehab in the past.  Currently managed on Pulmicort nebs, DuoNeb. Uses albuterol as needed, not every day. No wheeze. Occasional cough with some clear sputum.  He also has a history of rounded atelectasis in the lingula that is decreasing in size on serial CT scans.   Review of Systems  Constitutional: Negative for fever and unexpected weight change.  HENT: Positive for congestion. Negative for dental problem, ear pain, nosebleeds, postnasal drip, rhinorrhea, sinus pressure, sneezing, sore throat and trouble swallowing.   Eyes: Negative for redness and itching.  Respiratory: Positive for cough. Negative for chest tightness, shortness of breath and wheezing.   Cardiovascular: Negative  for palpitations and leg swelling.  Gastrointestinal: Negative for abdominal distention, nausea and vomiting.  Genitourinary: Negative for dysuria.  Musculoskeletal: Negative for joint swelling.  Skin: Negative for rash.  Neurological: Negative for headaches.  Hematological: Does not bruise/bleed easily.  Psychiatric/Behavioral: Negative for dysphoric mood. The patient is not nervous/anxious.        Objective:   Physical Exam Vitals:   02/13/20 1452  BP: (!) 120/50  Pulse: (!) 101  Temp: 97.8 F (36.6 C)  TempSrc: Temporal  SpO2: 90%  Weight: 172 lb 3.2 oz (78.1 kg)  Height: 5\' 11"  (1.803 m)   Gen: Pleasant, thin, well-nourished, in no distress,  normal affect  ENT: No lesions,  mouth clear,  oropharynx clear, no postnasal drip  Neck: No JVD, no stridor  Lungs: No use of accessory muscles, clear without rales or rhonchi, no wheeze today on a regular breath, he does wheeze on a forced exp  Cardiovascular: RRR, heart sounds normal, no murmur or gallops, no peripheral edema  Musculoskeletal: No deformities, no cyanosis or clubbing  Neuro: alert, non focal  Skin: Warm, no lesions or rashes      Assessment & Plan:  COPD (chronic obstructive pulmonary disease) Please continue your Pulmicort nebs twice a day Please continue your DuoNeb 4 times a day on a schedule Keep albuterol available to use either 1 nebulizer treatment or 2 puffs if you need it for shortness of breath, chest tightness, wheezing. Continue your oxygen at 4 to 5 L/min with exertion. We will refer you for pulmonary rehabilitation Covid vaccine is up-to-date  Follow with Dr Lamonte Sakai in 3 months or sooner if you have any problems  [name], MD, PhD 02/13/2020, 3:15 PM [facility] [phone] or if no answer [phone]

## 2020-02-13 NOTE — Assessment & Plan Note (Signed)
Please continue your Pulmicort nebs twice a day Please continue your DuoNeb 4 times a day on a schedule Keep albuterol available to use either 1 nebulizer treatment or 2 puffs if you need it for shortness of breath, chest tightness, wheezing. Continue your oxygen at 4 to 5 L/min with exertion. We will refer you for pulmonary rehabilitation Covid vaccine is up-to-date Follow with Dr Faheem Ziemann in 3 months or sooner if you have any problems.  

## 2020-02-16 ENCOUNTER — Encounter (HOSPITAL_COMMUNITY): Payer: Self-pay | Admitting: *Deleted

## 2020-02-16 NOTE — Progress Notes (Signed)
Received referral from Dr. Delton Coombes for this pt to participate in pulmonary rehab with the the diagnosis of emphysema. Pt is well known to pulmonary rehab staff from previous participations in 2018; 2019-2020.  Pt planned to continue his exercise in our pulmonary rehab maintenance program.  Unfortunately the Program was closed in March 2020 to patients due to Covid. Pt has become progressively deconditioned due to limited opportunities for exercise.  Clinical review of pt follow up appt on 4/30 Pulmonary office note.  Pt with Covid Risk Score - 6. Pt appropriate for scheduling for Pulmonary rehab.  Will forward to support staff for  verification of insurance  Eligibility/benefits and to pulmonary rehab staff for [facility] with pt consent. Alanson Aly, BSN Cardiac and Emergency planning/management officer

## 2020-02-17 ENCOUNTER — Telehealth (HOSPITAL_COMMUNITY): Payer: Self-pay

## 2020-02-17 NOTE — Telephone Encounter (Signed)
Pt insurance is active and benefits verified through [employer] adv Co-pay $15, DED 0/0 met, out of pocket $3,400/$30 met, co-insurance 0%. no pre-authorization required. Passport, [place], REF# [id number]

## 2020-02-19 ENCOUNTER — Telehealth (HOSPITAL_COMMUNITY): Payer: Self-pay | Admitting: *Deleted

## 2020-03-03 ENCOUNTER — Telehealth (HOSPITAL_COMMUNITY): Payer: Self-pay

## 2020-03-12 ENCOUNTER — Other Ambulatory Visit: Payer: Self-pay | Admitting: Emergency Medicine

## 2020-03-12 DIAGNOSIS — M179 Osteoarthritis of knee, unspecified: Secondary | ICD-10-CM | POA: Diagnosis not present

## 2020-03-12 DIAGNOSIS — E7800 Pure hypercholesterolemia, unspecified: Secondary | ICD-10-CM | POA: Diagnosis not present

## 2020-03-12 DIAGNOSIS — N400 Enlarged prostate without lower urinary tract symptoms: Secondary | ICD-10-CM | POA: Diagnosis not present

## 2020-03-12 DIAGNOSIS — J449 Chronic obstructive pulmonary disease, unspecified: Secondary | ICD-10-CM | POA: Diagnosis not present

## 2020-03-12 DIAGNOSIS — N183 Chronic kidney disease, stage 3 unspecified: Secondary | ICD-10-CM | POA: Diagnosis not present

## 2020-03-12 DIAGNOSIS — I10 Essential (primary) hypertension: Secondary | ICD-10-CM | POA: Diagnosis not present

## 2020-04-01 DIAGNOSIS — J449 Chronic obstructive pulmonary disease, unspecified: Secondary | ICD-10-CM | POA: Diagnosis not present

## 2020-04-01 DIAGNOSIS — E7800 Pure hypercholesterolemia, unspecified: Secondary | ICD-10-CM | POA: Diagnosis not present

## 2020-04-01 DIAGNOSIS — I10 Essential (primary) hypertension: Secondary | ICD-10-CM | POA: Diagnosis not present

## 2020-04-01 DIAGNOSIS — M179 Osteoarthritis of knee, unspecified: Secondary | ICD-10-CM | POA: Diagnosis not present

## 2020-04-01 DIAGNOSIS — N183 Chronic kidney disease, stage 3 unspecified: Secondary | ICD-10-CM | POA: Diagnosis not present

## 2020-04-01 DIAGNOSIS — N400 Enlarged prostate without lower urinary tract symptoms: Secondary | ICD-10-CM | POA: Diagnosis not present

## 2020-04-07 ENCOUNTER — Encounter: Payer: Self-pay | Admitting: Emergency Medicine

## 2020-04-07 ENCOUNTER — Ambulatory Visit: Payer: PPO | Admitting: Emergency Medicine

## 2020-04-07 ENCOUNTER — Other Ambulatory Visit: Payer: Self-pay

## 2020-04-07 DIAGNOSIS — J449 Chronic obstructive pulmonary disease, unspecified: Secondary | ICD-10-CM

## 2020-04-07 DIAGNOSIS — J301 Allergic rhinitis due to pollen: Secondary | ICD-10-CM | POA: Diagnosis not present

## 2020-04-07 DIAGNOSIS — J9611 Chronic respiratory failure with hypoxia: Secondary | ICD-10-CM | POA: Diagnosis not present

## 2020-04-07 NOTE — Addendum Note (Signed)
Addended by: Wyvonne Lenz on: 04/07/2020 03:16 PM   Modules accepted: Orders

## 2020-04-07 NOTE — Assessment & Plan Note (Signed)
Over all stable but debilitated. Has not been able to do pulm rehab yet   We will work on obtaining a portable oxygen concentrator through [employer].  Your dose will be 4 L/min pulsed flow Please continue your nebulized regimen: DuoNeb 4 times a day, Pulmicort 2 times a day. Use albuterol 2 puffs or 1 nebulizer treatment up to every 4 hours if needed for shortness of breath, chest tightness, wheezing. COVID-19 vaccine up-to-date Your pneumonia shot is up-to-date.  You should not need another Follow with Dr Delton Coombes in 4 months or sooner if you have any problems.

## 2020-04-07 NOTE — Patient Instructions (Addendum)
Continue fluticasone nasal spray. You could consider adding loratadine (Claritin) once a day.  We will work on obtaining a portable oxygen concentrator through [employer].  Your dose will be 4 L/min pulsed flow Please continue your nebulized regimen: DuoNeb 4 times a day, Pulmicort 2 times a day. Use albuterol 2 puffs or 1 nebulizer treatment up to every 4 hours if needed for shortness of breath, chest tightness, wheezing. COVID-19 vaccine up-to-date Your pneumonia shot is up-to-date.  You should not need another Follow with Dr Delton Coombes in 4 months or sooner if you have any problems.

## 2020-04-07 NOTE — Progress Notes (Signed)
Subjective:    Patient ID: Charles Archer, male    DOB: 24-Jun-1936, 84 y.o.   MRN: [id number]  COPD He complains of cough. There is no shortness of breath or wheezing. Pertinent negatives include no ear pain, fever, headaches, postnasal drip, rhinorrhea, sneezing, sore throat or trouble swallowing. His past medical history is significant for COPD.    ROV 10/14/2019 --[name] is 79, has a history of COPD, chronic cough with allergic rhinitis, chronic hypoxic respiratory failure.  We have also been following an area of lingular rounded atelectasis that has decreased in size on serial CT scans.  At his last visit we continued DuoNeb and titrated his home oxygen need to 4 to 6 L/min.  He needed a new home concentrator through [facility] [place] in order to accomplish this.  When he was at pulmonary rehab he also needed 10 L/min with significant exertion. He is having more congestion and cough w clear mucous, may be worse in the am. He is on flonase, has some throat clearing and hoarseness.  ROV 02/13/20 --follow-up visit for 84 year old gentleman with history of severe COPD with associated chronic hypoxemic respiratory failure.  He uses 4 to 6 L/min, his needed is much 10 L/min with significant exertion when he was doing pulmonary rehab in the past.  Currently managed on Pulmicort nebs, DuoNeb. Uses albuterol as needed, not every day. No wheeze. Occasional cough with some clear sputum.  He also has a history of rounded atelectasis in the lingula that is decreasing in size on serial CT scans.  ROV 04/07/20 --84 year old gentleman with severe COPD, associated hypoxemic respiratory failure.  Typically on 4 to 5 L/min, sometimes up as high as 10 L/min.  He is managed on Pulmicort and DuoNeb on a schedule.  He uses albuterol 0-2x a day.  Last time we had talked about referring back for pulmonary rehabilitation.  He has not done this, states that the walk from the parking lot would have been too much for him. He is stable  - no changes in his breathing. He is using his SABA pre-exertion. Seldom coughs, hears an ear fullness, still on flonase. No flares, abx, pred.  COVID vaccine up to date.    Review of Systems  Constitutional: Negative for fever and unexpected weight change.  HENT: Positive for congestion. Negative for dental problem, ear pain, nosebleeds, postnasal drip, rhinorrhea, sinus pressure, sneezing, sore throat and trouble swallowing.   Eyes: Negative for redness and itching.  Respiratory: Positive for cough. Negative for chest tightness, shortness of breath and wheezing.   Cardiovascular: Negative for palpitations and leg swelling.  Gastrointestinal: Negative for abdominal distention, nausea and vomiting.  Genitourinary: Negative for dysuria.  Musculoskeletal: Negative for joint swelling.  Skin: Negative for rash.  Neurological: Negative for headaches.  Hematological: Does not bruise/bleed easily.  Psychiatric/Behavioral: Negative for dysphoric mood. The patient is not nervous/anxious.        Objective:   Physical Exam Vitals:   04/07/20 1429  BP: 140/74  Pulse: 100  Temp: 98.5 F (36.9 C)  TempSrc: Oral  SpO2: 95%  Weight: 173 lb (78.5 kg)  Height: 5\' 11"  (1.803 m)   Gen: Pleasant, thin, well-nourished, in no distress,  normal affect  ENT: No lesions,  mouth clear,  oropharynx clear, no postnasal drip  Neck: No JVD, no stridor  Lungs: No use of accessory muscles, clear without rales or rhonchi, no wheeze today on a regular breath, he does wheeze on a forced exp  Cardiovascular:  RRR, heart sounds normal, no murmur or gallops, no peripheral edema  Musculoskeletal: No deformities, no cyanosis or clubbing  Neuro: alert, non focal  Skin: Warm, no lesions or rashes      Assessment & Plan:  COPD (chronic obstructive pulmonary disease) Over all stable but debilitated. Has not been able to do pulm rehab yet   We will work on obtaining a portable oxygen concentrator through  [employer].  Your dose will be 4 L/min pulsed flow Please continue your nebulized regimen: DuoNeb 4 times a day, Pulmicort 2 times a day. Use albuterol 2 puffs or 1 nebulizer treatment up to every 4 hours if needed for shortness of breath, chest tightness, wheezing. COVID-19 vaccine up-to-date Your pneumonia shot is up-to-date.  You should not need another Follow with Dr Lamonte Sakai in 4 months or sooner if you have any problems.  Allergic rhinitis Continue fluticasone nasal spray. You could consider adding loratadine (Claritin) once a day.   Hypoxemic respiratory failure, chronic ([name]) We will work on getting a portable oxygen concentrator for you through [facility].  [name], MD, PhD 04/07/2020, 3:08 PM [facility] [phone] or if no answer [phone]

## 2020-04-07 NOTE — Assessment & Plan Note (Signed)
Continue fluticasone nasal spray. You could consider adding loratadine (Claritin) once a day.

## 2020-04-07 NOTE — Assessment & Plan Note (Signed)
We will work on getting a Therapist, art for you through [facility].

## 2020-04-08 ENCOUNTER — Other Ambulatory Visit: Payer: Self-pay | Admitting: Emergency Medicine

## 2020-04-09 ENCOUNTER — Other Ambulatory Visit: Payer: Self-pay | Admitting: Emergency Medicine

## 2020-04-21 ENCOUNTER — Other Ambulatory Visit: Payer: Self-pay | Admitting: Emergency Medicine

## 2020-04-21 DIAGNOSIS — E7800 Pure hypercholesterolemia, unspecified: Secondary | ICD-10-CM | POA: Diagnosis not present

## 2020-04-21 DIAGNOSIS — M179 Osteoarthritis of knee, unspecified: Secondary | ICD-10-CM | POA: Diagnosis not present

## 2020-04-21 DIAGNOSIS — N400 Enlarged prostate without lower urinary tract symptoms: Secondary | ICD-10-CM | POA: Diagnosis not present

## 2020-04-21 DIAGNOSIS — I10 Essential (primary) hypertension: Secondary | ICD-10-CM | POA: Diagnosis not present

## 2020-04-21 DIAGNOSIS — N183 Chronic kidney disease, stage 3 unspecified: Secondary | ICD-10-CM | POA: Diagnosis not present

## 2020-04-21 DIAGNOSIS — J449 Chronic obstructive pulmonary disease, unspecified: Secondary | ICD-10-CM | POA: Diagnosis not present

## 2020-05-20 DIAGNOSIS — E7800 Pure hypercholesterolemia, unspecified: Secondary | ICD-10-CM | POA: Diagnosis not present

## 2020-05-20 DIAGNOSIS — M179 Osteoarthritis of knee, unspecified: Secondary | ICD-10-CM | POA: Diagnosis not present

## 2020-05-20 DIAGNOSIS — I10 Essential (primary) hypertension: Secondary | ICD-10-CM | POA: Diagnosis not present

## 2020-05-20 DIAGNOSIS — N400 Enlarged prostate without lower urinary tract symptoms: Secondary | ICD-10-CM | POA: Diagnosis not present

## 2020-05-20 DIAGNOSIS — N183 Chronic kidney disease, stage 3 unspecified: Secondary | ICD-10-CM | POA: Diagnosis not present

## 2020-05-20 DIAGNOSIS — J449 Chronic obstructive pulmonary disease, unspecified: Secondary | ICD-10-CM | POA: Diagnosis not present

## 2020-06-09 ENCOUNTER — Other Ambulatory Visit: Payer: Self-pay | Admitting: Emergency Medicine

## 2020-06-11 DIAGNOSIS — N400 Enlarged prostate without lower urinary tract symptoms: Secondary | ICD-10-CM | POA: Diagnosis not present

## 2020-06-11 DIAGNOSIS — Z0000 Encounter for general adult medical examination without abnormal findings: Secondary | ICD-10-CM | POA: Diagnosis not present

## 2020-06-11 DIAGNOSIS — Z23 Encounter for immunization: Secondary | ICD-10-CM | POA: Diagnosis not present

## 2020-06-11 DIAGNOSIS — Z9981 Dependence on supplemental oxygen: Secondary | ICD-10-CM | POA: Diagnosis not present

## 2020-06-11 DIAGNOSIS — M109 Gout, unspecified: Secondary | ICD-10-CM | POA: Diagnosis not present

## 2020-06-11 DIAGNOSIS — J449 Chronic obstructive pulmonary disease, unspecified: Secondary | ICD-10-CM | POA: Diagnosis not present

## 2020-06-11 DIAGNOSIS — E7800 Pure hypercholesterolemia, unspecified: Secondary | ICD-10-CM | POA: Diagnosis not present

## 2020-06-11 DIAGNOSIS — N183 Chronic kidney disease, stage 3 unspecified: Secondary | ICD-10-CM | POA: Diagnosis not present

## 2020-06-11 DIAGNOSIS — M179 Osteoarthritis of knee, unspecified: Secondary | ICD-10-CM | POA: Diagnosis not present

## 2020-06-11 DIAGNOSIS — I10 Essential (primary) hypertension: Secondary | ICD-10-CM | POA: Diagnosis not present

## 2020-06-16 DIAGNOSIS — N183 Chronic kidney disease, stage 3 unspecified: Secondary | ICD-10-CM | POA: Diagnosis not present

## 2020-06-16 DIAGNOSIS — J449 Chronic obstructive pulmonary disease, unspecified: Secondary | ICD-10-CM | POA: Diagnosis not present

## 2020-06-16 DIAGNOSIS — E7800 Pure hypercholesterolemia, unspecified: Secondary | ICD-10-CM | POA: Diagnosis not present

## 2020-06-16 DIAGNOSIS — M179 Osteoarthritis of knee, unspecified: Secondary | ICD-10-CM | POA: Diagnosis not present

## 2020-06-16 DIAGNOSIS — I10 Essential (primary) hypertension: Secondary | ICD-10-CM | POA: Diagnosis not present

## 2020-06-16 DIAGNOSIS — N400 Enlarged prostate without lower urinary tract symptoms: Secondary | ICD-10-CM | POA: Diagnosis not present

## 2020-07-22 ENCOUNTER — Telehealth: Payer: Self-pay | Admitting: Emergency Medicine

## 2020-07-22 DIAGNOSIS — I10 Essential (primary) hypertension: Secondary | ICD-10-CM | POA: Diagnosis not present

## 2020-07-22 DIAGNOSIS — M179 Osteoarthritis of knee, unspecified: Secondary | ICD-10-CM | POA: Diagnosis not present

## 2020-07-22 DIAGNOSIS — E7800 Pure hypercholesterolemia, unspecified: Secondary | ICD-10-CM | POA: Diagnosis not present

## 2020-07-22 DIAGNOSIS — J449 Chronic obstructive pulmonary disease, unspecified: Secondary | ICD-10-CM | POA: Diagnosis not present

## 2020-07-22 DIAGNOSIS — N183 Chronic kidney disease, stage 3 unspecified: Secondary | ICD-10-CM | POA: Diagnosis not present

## 2020-07-22 DIAGNOSIS — N400 Enlarged prostate without lower urinary tract symptoms: Secondary | ICD-10-CM | POA: Diagnosis not present

## 2020-07-22 NOTE — Telephone Encounter (Signed)
Pt's PCP states that Pulmicort needs PA. Tried to initiate PA via [email or web address], but received a message stating that we could not proceed with my PA as a PA had already been initiated by pharmacy. Called [facility], states that they need a PA but have not initiated one.  Provided the phone number [phone] to initiate PA.  Called the number listed above (for [facility]) and initiated a prior auth for this patient.  This has been sent to pharmacy for clinical review, and we will be notified via email of approval/denial.  Will await determination.

## 2020-07-27 NOTE — Telephone Encounter (Signed)
Pt returning a phone call. Pt can be reached at [phone].

## 2020-07-27 NOTE — Telephone Encounter (Signed)
Spoke with the pt and made him aware that his medication pulmicort was approved  Nothing further needed

## 2020-07-27 NOTE — Telephone Encounter (Signed)
Called [facility] and spoke to [place]. She states the PA for Pulmicort has been approved until 10/15/2021. PA#: [id number].   lmtcb for pt.

## 2020-08-09 ENCOUNTER — Other Ambulatory Visit: Payer: Self-pay | Admitting: Emergency Medicine

## 2020-08-17 ENCOUNTER — Other Ambulatory Visit: Payer: Self-pay

## 2020-08-17 ENCOUNTER — Ambulatory Visit: Payer: PPO | Admitting: Emergency Medicine

## 2020-08-17 ENCOUNTER — Encounter: Payer: Self-pay | Admitting: Emergency Medicine

## 2020-08-17 DIAGNOSIS — J9611 Chronic respiratory failure with hypoxia: Secondary | ICD-10-CM | POA: Diagnosis not present

## 2020-08-17 DIAGNOSIS — J449 Chronic obstructive pulmonary disease, unspecified: Secondary | ICD-10-CM | POA: Diagnosis not present

## 2020-08-17 MED ORDER — PREDNISONE 10 MG PO TABS: ORAL_TABLET | ORAL | 0 refills | Status: DC

## 2020-08-17 NOTE — Addendum Note (Signed)
Addended by: Benjie Karvonen R on: 08/17/2020 05:11 PM   Modules accepted: Orders

## 2020-08-17 NOTE — Progress Notes (Signed)
Subjective:    Patient ID: Charles Archer, male    DOB: 22-Sep-1936, 84 y.o.   MRN: [id number]  COPD He complains of cough. There is no shortness of breath or wheezing. Pertinent negatives include no ear pain, fever, headaches, postnasal drip, rhinorrhea, sneezing, sore throat or trouble swallowing. His past medical history is significant for COPD.    ROV 04/07/20 --84 year old gentleman with severe COPD, associated hypoxemic respiratory failure.  Typically on 4 to 5 L/min, sometimes up as high as 10 L/min.  He is managed on Pulmicort and DuoNeb on a schedule.  He uses albuterol 0-2x a day.  Last time we had talked about referring back for pulmonary rehabilitation.  He has not done this, states that the walk from the parking lot would have been too much for him. He is stable - no changes in his breathing. He is using his SABA pre-exertion. Seldom coughs, hears an ear fullness, still on flonase. No flares, abx, pred.  COVID vaccine up to date.   ROV 08/17/20 --84 year old man with severe COPD, associated hypoxemic respiratory failure. Has been managed on 4 L/min pulsed flow oxygen, uses 5L/min continuous flow at home. He has desaturations into the 70's with exertion.  He uses DuoNeb 4 times daily, Pulmicort twice daily. He is using his albuterol about 4-5x a day, uses it in the am when he gets up.  On fluticasone nasal spray for his rhinitis.   Review of Systems  Constitutional: Negative for fever and unexpected weight change.  HENT: Positive for congestion. Negative for dental problem, ear pain, nosebleeds, postnasal drip, rhinorrhea, sinus pressure, sneezing, sore throat and trouble swallowing.   Eyes: Negative for redness and itching.  Respiratory: Positive for cough. Negative for chest tightness, shortness of breath and wheezing.   Cardiovascular: Negative for palpitations and leg swelling.  Gastrointestinal: Negative for abdominal distention, nausea and vomiting.  Genitourinary: Negative  for dysuria.  Musculoskeletal: Negative for joint swelling.  Skin: Negative for rash.  Neurological: Negative for headaches.  Hematological: Does not bruise/bleed easily.  Psychiatric/Behavioral: Negative for dysphoric mood. The patient is not nervous/anxious.        Objective:   Physical Exam Vitals:   08/17/20 1637 08/17/20 1640  BP: 130/72   Pulse: (!) 101 100  Temp: (!) 97.4 F (36.3 C)   TempSrc: Temporal   SpO2: (!) 81% 94%  Weight: 170 lb (77.1 kg)   Height: 5\' 11"  (1.803 m)    Gen: Pleasant, thin, well-nourished, in no distress,  normal affect  ENT: No lesions,  mouth clear,  oropharynx clear, no postnasal drip  Neck: No JVD, no stridor  Lungs: No use of accessory muscles, clear without rales or rhonchi, no wheeze today on a regular breath, he does wheeze on a forced exp  Cardiovascular: RRR, heart sounds normal, no murmur or gallops, no peripheral edema  Musculoskeletal: No deformities, no cyanosis or clubbing  Neuro: alert, non focal  Skin: Warm, no lesions or rashes      Assessment & Plan:  COPD (chronic obstructive pulmonary disease) Symptomatic despite his bronchodilator therapy.  In part I believe he is not adequately oxygenated.  We may need to consider getting him a home concentrator that can be titrated up further than 5 L/min.  I think it would be reasonable to try starting low-dose prednisone to see if he gets benefit.  I will start him at 20 mg, work down to 10 mg and see how he does.  Please continue DuoNeb  4 times a day on a schedule Please continue budesonide nebulizers 2 times a day on a schedule Keep your albuterol available use 2 puffs when you need it for shortness of breath, chest tightness, wheezing. Please start prednisone 20 mg once daily for the next 2 weeks.  At that time decrease to 10 mg daily.  Stay on 10 mg daily until next visit and we will discuss the dosing further. COVID-19 vaccine is up-to-date.  You are due for the booster.   Agree with getting this Flu shot up-to-date Follow with Dr Delton Coombes in 1 month or next available.  Hypoxemic respiratory failure, chronic (HCC) Continue your oxygen at 4 to 5 L/min.  We may need to pursue obtaining a new home concentrator that goes up higher than 5 L/min.  This would allow [place] to turn your oxygen up when you are exerting yourself through the house.  We can talk about this next time.  [name], MD, PhD 08/17/2020, 5:06 PM [facility] [phone] or if no answer [phone]

## 2020-08-17 NOTE — Patient Instructions (Signed)
Please continue DuoNeb 4 times a day on a schedule Please continue budesonide nebulizers 2 times a day on a schedule Keep your albuterol available use 2 puffs when you need it for shortness of breath, chest tightness, wheezing. Continue your oxygen at 4 to 5 L/min.  We may need to pursue obtaining a new home concentrator that goes up higher than 5 L/min.  This would allow [place] to turn your oxygen up when you are exerting yourself through the house.  We can talk about this next time. Please start prednisone 20 mg once daily for the next 2 weeks.  At that time decrease to 10 mg daily.  Stay on 10 mg daily until next visit and we will discuss the dosing further. COVID-19 vaccine is up-to-date.  You are due for the booster.  Agree with getting this Flu shot up-to-date Follow with Dr Delton Coombes in 1 month or next available.

## 2020-08-17 NOTE — Assessment & Plan Note (Signed)
Symptomatic despite his bronchodilator therapy.  In part I believe he is not adequately oxygenated.  We may need to consider getting him a home concentrator that can be titrated up further than 5 L/min.  I think it would be reasonable to try starting low-dose prednisone to see if he gets benefit.  I will start him at 20 mg, work down to 10 mg and see how he does.  Please continue DuoNeb 4 times a day on a schedule Please continue budesonide nebulizers 2 times a day on a schedule Keep your albuterol available use 2 puffs when you need it for shortness of breath, chest tightness, wheezing. Please start prednisone 20 mg once daily for the next 2 weeks.  At that time decrease to 10 mg daily.  Stay on 10 mg daily until next visit and we will discuss the dosing further. COVID-19 vaccine is up-to-date.  You are due for the booster.  Agree with getting this Flu shot up-to-date Follow with Dr Delton Coombes in 1 month or next available.

## 2020-08-17 NOTE — Assessment & Plan Note (Signed)
Continue your oxygen at 4 to 5 L/min.  We may need to pursue obtaining a new home concentrator that goes up higher than 5 L/min.  This would allow [place] to turn your oxygen up when you are exerting yourself through the house.  We can talk about this next time.

## 2020-09-14 DIAGNOSIS — J449 Chronic obstructive pulmonary disease, unspecified: Secondary | ICD-10-CM | POA: Diagnosis not present

## 2020-09-14 DIAGNOSIS — M179 Osteoarthritis of knee, unspecified: Secondary | ICD-10-CM | POA: Diagnosis not present

## 2020-09-14 DIAGNOSIS — N183 Chronic kidney disease, stage 3 unspecified: Secondary | ICD-10-CM | POA: Diagnosis not present

## 2020-09-14 DIAGNOSIS — N400 Enlarged prostate without lower urinary tract symptoms: Secondary | ICD-10-CM | POA: Diagnosis not present

## 2020-09-14 DIAGNOSIS — E7800 Pure hypercholesterolemia, unspecified: Secondary | ICD-10-CM | POA: Diagnosis not present

## 2020-09-14 DIAGNOSIS — I10 Essential (primary) hypertension: Secondary | ICD-10-CM | POA: Diagnosis not present

## 2020-09-30 ENCOUNTER — Other Ambulatory Visit: Payer: Self-pay

## 2020-09-30 ENCOUNTER — Ambulatory Visit: Payer: PPO | Admitting: Emergency Medicine

## 2020-09-30 ENCOUNTER — Encounter: Payer: Self-pay | Admitting: Emergency Medicine

## 2020-09-30 DIAGNOSIS — J9611 Chronic respiratory failure with hypoxia: Secondary | ICD-10-CM

## 2020-09-30 DIAGNOSIS — J449 Chronic obstructive pulmonary disease, unspecified: Secondary | ICD-10-CM | POA: Diagnosis not present

## 2020-09-30 MED ORDER — ALBUTEROL SULFATE (2.5 MG/3ML) 0.083% IN NEBU: 2.5000 mg | INHALATION_SOLUTION | RESPIRATORY_TRACT | Status: AC

## 2020-09-30 MED ORDER — PREDNISONE 10 MG PO TABS
ORAL_TABLET | ORAL | 2 refills | Status: DC
Start: 2020-09-30 — End: 2020-12-07

## 2020-09-30 NOTE — Patient Instructions (Addendum)
We will increase your oxygen with exertion to 8 L/min.  We will work on getting a new oxygen concentrator for your home from [place]. We will order a standard wheelchair from [employer] Restart prednisone 10 mg once daily every day. Continue DuoNeb 4 times a day on a schedule Continue budesonide nebs twice a day on a schedule Continue use your albuterol nebulizer up to every 4 hours if needed for shortness of breath Follow with Dr. Delton Coombes in 2 months or sooner if you have any problems.

## 2020-09-30 NOTE — Assessment & Plan Note (Addendum)
Still has desaturations with almost any exertion even on 5 L/min.  That is as high as his home concentrator will go.  He needs a new concentrator.  I am going to increase him to 8 L/min with exertion. Will also get him a wheelchair >> he has inability to perform his MRADL in his home due to immobility. He is unable to use a cane, crutches or walker. He is able to propel a wheelchair and also has caregivers to help him with this.

## 2020-09-30 NOTE — Addendum Note (Signed)
Addended by: Dorisann Frames R on: 09/30/2020 03:17 PM   Modules accepted: Orders

## 2020-09-30 NOTE — Assessment & Plan Note (Signed)
Still very limited and difficult to tell how much he benefited from the prednisone.  I think that this is because of the hypoxemia is driving his exertional dyspnea.  We need to get him better oxygenated as above.  Restart prednisone 10 mg once daily every day. Continue DuoNeb 4 times a day on a schedule Continue budesonide nebs twice a day on a schedule Continue use your albuterol nebulizer up to every 4 hours if needed for shortness of breath Follow with Dr. Delton Coombes in 2 months or sooner if you have any problems

## 2020-09-30 NOTE — Progress Notes (Addendum)
Subjective:    Patient ID: Charles Archer, male    DOB: 1936-03-29, 84 y.o.   MRN: [id number]  COPD He complains of cough. There is no shortness of breath or wheezing. Pertinent negatives include no ear pain, fever, headaches, postnasal drip, rhinorrhea, sneezing, sore throat or trouble swallowing. His past medical history is significant for COPD.    ROV 04/07/20 --84 year old gentleman with severe COPD, associated hypoxemic respiratory failure.  Typically on 4 to 5 L/min, sometimes up as high as 10 L/min.  He is managed on Pulmicort and DuoNeb on a schedule.  He uses albuterol 0-2x a day.  Last time we had talked about referring back for pulmonary rehabilitation.  He has not done this, states that the walk from the parking lot would have been too much for him. He is stable - no changes in his breathing. He is using his SABA pre-exertion. Seldom coughs, hears an ear fullness, still on flonase. No flares, abx, pred.  COVID vaccine up to date.   ROV 08/17/20 --84 year old man with severe COPD, associated hypoxemic respiratory failure. Has been managed on 4 L/min pulsed flow oxygen, uses 5L/min continuous flow at home. He has desaturations into the 70's with exertion.  He uses DuoNeb 4 times daily, Pulmicort twice daily. He is using his albuterol about 4-5x a day, uses it in the am when he gets up.  On fluticasone nasal spray for his rhinitis.  ROV 09/30/20 --follow-up visit for 84 year old gentleman with chronic hypoxemic respiratory failure due to severe COPD.  He was seen 6 weeks ago for persistent dyspnea and was noted that he has had desaturations even on his usual 4 to 5 L/min.  His maintenance routine is DuoNeb 4 times a day, budesonide nebs twice daily and albuterol as needed, 4-5 times a day.  We started prednisone 20 mg daily then down to 10 mg daily to see if you get benefit from chronic corticosteroids.  He returns now and reports that he does believe that he benefited, but it is difficult to  tell based on the degree of his SOB that he describes - initially felt more energy, then had same difficulty with tasks as before. He is still seeing desaturations w any activity.    Review of Systems  Constitutional: Negative for fever and unexpected weight change.  HENT: Positive for congestion. Negative for dental problem, ear pain, nosebleeds, postnasal drip, rhinorrhea, sinus pressure, sneezing, sore throat and trouble swallowing.   Eyes: Negative for redness and itching.  Respiratory: Positive for cough. Negative for chest tightness, shortness of breath and wheezing.   Cardiovascular: Negative for palpitations and leg swelling.  Gastrointestinal: Negative for abdominal distention, nausea and vomiting.  Genitourinary: Negative for dysuria.  Musculoskeletal: Negative for joint swelling.  Skin: Negative for rash.  Neurological: Negative for headaches.  Hematological: Does not bruise/bleed easily.  Psychiatric/Behavioral: Negative for dysphoric mood. The patient is not nervous/anxious.        Objective:   Physical Exam Vitals:   09/30/20 1432  BP: 116/68  Pulse: (!) 107  Temp: (!) 97.3 F (36.3 C)  SpO2: 94%  Weight: 169 lb 9.6 oz (76.9 kg)  Height: 5\' 11"  (1.803 m)   Gen: Pleasant, thin, well-nourished, in no distress,  normal affect  ENT: No lesions,  mouth clear,  oropharynx clear, no postnasal drip  Neck: No JVD, no stridor  Lungs: No use of accessory muscles, clear without rales or rhonchi, no wheeze today on a regular breath, he does  wheeze on a forced exp  Cardiovascular: RRR, heart sounds normal, no murmur or gallops, no peripheral edema  Musculoskeletal: No deformities, no cyanosis or clubbing  Neuro: alert, non focal  Skin: Warm, no lesions or rashes      Assessment & Plan:  Hypoxemic respiratory failure, chronic (HCC) Still has desaturations with almost any exertion even on 5 L/min.  That is as high as his home concentrator will go.  He needs a new  concentrator.  I am going to increase him to 8 L/min with exertion. Will also get him a wheelchair >> he has inability to perform his MRADL in his home due to immobility. He is unable to use a cane, crutches or walker. He is able to propel a wheelchair and also has caregivers to help him with this.   COPD (chronic obstructive pulmonary disease) Still very limited and difficult to tell how much he benefited from the prednisone.  I think that this is because of the hypoxemia is driving his exertional dyspnea.  We need to get him better oxygenated as above.  Restart prednisone 10 mg once daily every day. Continue DuoNeb 4 times a day on a schedule Continue budesonide nebs twice a day on a schedule Continue use your albuterol nebulizer up to every 4 hours if needed for shortness of breath Follow with Dr. Delton Coombes in 2 months or sooner if you have any problems  [name], MD, PhD 10/11/2020, 4:44 PM [facility] [phone] or if no answer [phone]

## 2020-10-01 ENCOUNTER — Telehealth: Payer: Self-pay | Admitting: Emergency Medicine

## 2020-10-01 DIAGNOSIS — J9611 Chronic respiratory failure with hypoxia: Secondary | ICD-10-CM

## 2020-10-01 NOTE — Telephone Encounter (Signed)
I updated the order to include the liter flow- 8lpm

## 2020-10-04 DIAGNOSIS — M179 Osteoarthritis of knee, unspecified: Secondary | ICD-10-CM | POA: Diagnosis not present

## 2020-10-04 DIAGNOSIS — N183 Chronic kidney disease, stage 3 unspecified: Secondary | ICD-10-CM | POA: Diagnosis not present

## 2020-10-04 DIAGNOSIS — E7800 Pure hypercholesterolemia, unspecified: Secondary | ICD-10-CM | POA: Diagnosis not present

## 2020-10-04 DIAGNOSIS — I10 Essential (primary) hypertension: Secondary | ICD-10-CM | POA: Diagnosis not present

## 2020-10-04 DIAGNOSIS — N400 Enlarged prostate without lower urinary tract symptoms: Secondary | ICD-10-CM | POA: Diagnosis not present

## 2020-10-04 DIAGNOSIS — J449 Chronic obstructive pulmonary disease, unspecified: Secondary | ICD-10-CM | POA: Diagnosis not present

## 2020-10-04 NOTE — Telephone Encounter (Signed)
Referral complete and closed. Will close message. Nothing further needed at this time.

## 2020-10-05 ENCOUNTER — Telehealth: Payer: Self-pay | Admitting: Emergency Medicine

## 2020-10-05 DIAGNOSIS — R0609 Other forms of dyspnea: Secondary | ICD-10-CM

## 2020-10-05 DIAGNOSIS — J9611 Chronic respiratory failure with hypoxia: Secondary | ICD-10-CM

## 2020-10-05 NOTE — Telephone Encounter (Signed)
Order was put in for pt to get wheelchair and to send to [employer].  [facility] does not have wheelchairs at this time so I sent order to [employer].  They can provide the chair but I got a message from [facility] that new order is needed and it needs to state -   Patient has a mobility limitation that impairs their ability to do one or more MRADLS in customary locations in the home, AND patient cannot use a cane, crutches, or walker to resolve the issue sufficiently, AND patient can safely self-propel the wheelchair in the home or has a caregiver that can provide assistance.

## 2020-10-05 NOTE — Telephone Encounter (Signed)
New order placed as requested.  Nothing further needed at this time- will close encounter.

## 2020-10-06 ENCOUNTER — Telehealth: Payer: Self-pay | Admitting: Emergency Medicine

## 2020-10-06 NOTE — Telephone Encounter (Signed)
RB please advise if you can edit your last OV note to reflect this information for insurance coverage.  Thanks!

## 2020-10-06 NOTE — Telephone Encounter (Signed)
Per [employer], order for wheelchair must include the following in the OV NOTE not just the order:    We have the order for the patient, however we need the following progress note inside of the office visit notes:   Patient has a mobility limitation that impairs their ability to do one or more MRADLS in customary locations in the home, AND patient cannot use a cane, crutches, or walker to resolve the issue sufficiently, AND patient can safely self-propel the wheelchair in the home or has a caregiver that can provide assistance.   Please have them amend the notes so that this is in the order notes and not just on the order.   Thank you!

## 2020-10-08 ENCOUNTER — Other Ambulatory Visit: Payer: Self-pay | Admitting: Emergency Medicine

## 2020-10-11 NOTE — Telephone Encounter (Signed)
Forwarding back to [facility] to make aware.  Thanks!

## 2020-10-11 NOTE — Telephone Encounter (Signed)
I made an addendum to my note from 09/30/20

## 2020-10-13 DIAGNOSIS — J9611 Chronic respiratory failure with hypoxia: Secondary | ICD-10-CM | POA: Diagnosis not present

## 2020-10-13 DIAGNOSIS — J449 Chronic obstructive pulmonary disease, unspecified: Secondary | ICD-10-CM | POA: Diagnosis not present

## 2020-10-18 ENCOUNTER — Other Ambulatory Visit: Payer: Self-pay | Admitting: Emergency Medicine

## 2020-10-21 ENCOUNTER — Telehealth: Payer: Self-pay | Admitting: Emergency Medicine

## 2020-10-21 MED ORDER — IPRATROPIUM-ALBUTEROL 0.5-2.5 (3) MG/3ML IN SOLN: RESPIRATORY_TRACT | 5 refills | Status: AC

## 2020-10-21 NOTE — Telephone Encounter (Signed)
Called and spoke with pt and he stated that the pharmacy told him that we denied the refill of his duoneb.  I advised the pt that I did not see that on our end, but the refill has been sent in for him.  Nothing further is needed.

## 2020-11-10 DIAGNOSIS — N400 Enlarged prostate without lower urinary tract symptoms: Secondary | ICD-10-CM | POA: Diagnosis not present

## 2020-11-10 DIAGNOSIS — I10 Essential (primary) hypertension: Secondary | ICD-10-CM | POA: Diagnosis not present

## 2020-11-10 DIAGNOSIS — N183 Chronic kidney disease, stage 3 unspecified: Secondary | ICD-10-CM | POA: Diagnosis not present

## 2020-11-10 DIAGNOSIS — E7800 Pure hypercholesterolemia, unspecified: Secondary | ICD-10-CM | POA: Diagnosis not present

## 2020-11-10 DIAGNOSIS — M179 Osteoarthritis of knee, unspecified: Secondary | ICD-10-CM | POA: Diagnosis not present

## 2020-11-10 DIAGNOSIS — J449 Chronic obstructive pulmonary disease, unspecified: Secondary | ICD-10-CM | POA: Diagnosis not present

## 2020-11-13 DIAGNOSIS — J449 Chronic obstructive pulmonary disease, unspecified: Secondary | ICD-10-CM | POA: Diagnosis not present

## 2020-11-13 DIAGNOSIS — J9611 Chronic respiratory failure with hypoxia: Secondary | ICD-10-CM | POA: Diagnosis not present

## 2020-11-18 ENCOUNTER — Other Ambulatory Visit: Payer: Self-pay | Admitting: Emergency Medicine

## 2020-12-01 ENCOUNTER — Other Ambulatory Visit: Payer: Self-pay

## 2020-12-01 ENCOUNTER — Encounter: Payer: Self-pay | Admitting: Emergency Medicine

## 2020-12-01 ENCOUNTER — Ambulatory Visit: Payer: PPO | Admitting: Emergency Medicine

## 2020-12-01 DIAGNOSIS — J449 Chronic obstructive pulmonary disease, unspecified: Secondary | ICD-10-CM | POA: Diagnosis not present

## 2020-12-01 DIAGNOSIS — J301 Allergic rhinitis due to pollen: Secondary | ICD-10-CM

## 2020-12-01 DIAGNOSIS — J9611 Chronic respiratory failure with hypoxia: Secondary | ICD-10-CM

## 2020-12-01 NOTE — Assessment & Plan Note (Signed)
Fluticasone nasal spray twice a day.  We may have to modify this if his epistaxis continues.

## 2020-12-01 NOTE — Assessment & Plan Note (Signed)
Continue your oxygen at 7-8 L/min. Use your nasal saline gel to keep your nasal passages moist and avoid nosebleeding

## 2020-12-01 NOTE — Assessment & Plan Note (Signed)
Please continue your DuoNeb 4 times a day Please continue your budesonide nebulizer twice a day Keep your albuterol available use up to every 4 hours if needed for shortness of breath, chest tightness, wheezing. Continue prednisone 10 mg once daily Follow with Dr Delton Coombes in 3 months or sooner if you have any problems.

## 2020-12-01 NOTE — Progress Notes (Signed)
Subjective:    Patient ID: Charles Archer, male    DOB: 11-Aug-1936, 85 y.o.   MRN: [id number]  COPD He complains of cough. There is no shortness of breath or wheezing. Pertinent negatives include no ear pain, fever, headaches, postnasal drip, rhinorrhea, sneezing, sore throat or trouble swallowing. His past medical history is significant for COPD.    ROV 09/30/20 --follow-up visit for 85 year old gentleman with chronic hypoxemic respiratory failure due to severe COPD.  He was seen 6 weeks ago for persistent dyspnea and was noted that he has had desaturations even on his usual 4 to 5 L/min.  His maintenance routine is DuoNeb 4 times a day, budesonide nebs twice daily and albuterol as needed, 4-5 times a day.  We started prednisone 20 mg daily then down to 10 mg daily to see if you get benefit from chronic corticosteroids.  He returns now and reports that he does believe that he benefited, but it is difficult to tell based on the degree of his SOB that he describes - initially felt more energy, then had same difficulty with tasks as before. He is still seeing desaturations w any activity.   ROV 12/01/20 --85 year old man with chronic hypoxemic respiratory failure due to severe COPD.  He is on 8 L/min.  He is maintained on DuoNeb 4 times a day budesonide nebs twice daily and albuterol through the day.  He is on chronic prednisone, current dose 10 mg daily.  Last time worked on getting him a wheelchair and increase his oxygen as above. Today reports that he is doing fairly well. He still sees desats with exertion. The highest his portable will go is 6L/min.  He is on flonase bid, had an episode of epistaxis since last time since we increased the O2 flow rate.    Review of Systems  Constitutional: Negative for fever and unexpected weight change.  HENT: Positive for congestion. Negative for dental problem, ear pain, nosebleeds, postnasal drip, rhinorrhea, sinus pressure, sneezing, sore throat and trouble  swallowing.   Eyes: Negative for redness and itching.  Respiratory: Positive for cough. Negative for chest tightness, shortness of breath and wheezing.   Cardiovascular: Negative for palpitations and leg swelling.  Gastrointestinal: Negative for abdominal distention, nausea and vomiting.  Genitourinary: Negative for dysuria.  Musculoskeletal: Negative for joint swelling.  Skin: Negative for rash.  Neurological: Negative for headaches.  Hematological: Does not bruise/bleed easily.  Psychiatric/Behavioral: Negative for dysphoric mood. The patient is not nervous/anxious.        Objective:   Physical Exam Vitals:   12/01/20 1536  BP: 106/64  Pulse: (!) 107  Temp: (!) 97.5 F (36.4 C)  SpO2: 92%  Weight: 171 lb (77.6 kg)  Height: 5\' 11"  (1.803 m)   Gen: Pleasant, thin, well-nourished, in no distress,  normal affect  ENT: No lesions,  mouth clear,  oropharynx clear, no postnasal drip  Neck: No JVD, no stridor  Lungs: No use of accessory muscles, clear without rales or rhonchi, no wheeze today on a regular breath, he does wheeze on a forced exp  Cardiovascular: RRR, heart sounds normal, no murmur or gallops, no peripheral edema  Musculoskeletal: No deformities, no cyanosis or clubbing  Neuro: alert, non focal  Skin: Warm, no lesions or rashes      Assessment & Plan:  COPD (chronic obstructive pulmonary disease) Please continue your DuoNeb 4 times a day Please continue your budesonide nebulizer twice a day Keep your albuterol available use up to every  4 hours if needed for shortness of breath, chest tightness, wheezing. Continue prednisone 10 mg once daily Follow with Dr Delton Coombes in 3 months or sooner if you have any problems.  Hypoxemic respiratory failure, chronic (HCC) Continue your oxygen at 7-8 L/min. Use your nasal saline gel to keep your nasal passages moist and avoid nosebleeding  Allergic rhinitis Fluticasone nasal spray twice a day.  We may have to modify this  if his epistaxis continues.  [name], MD, PhD 12/01/2020, 4:11 PM [facility] [phone] or if no answer [phone]

## 2020-12-01 NOTE — Patient Instructions (Addendum)
Please continue your DuoNeb 4 times a day Please continue your budesonide nebulizer twice a day Keep your albuterol available use up to every 4 hours if needed for shortness of breath, chest tightness, wheezing. Continue prednisone 10 mg once daily Continue your oxygen at 7-8 L/min. Use your nasal saline gel to keep your nasal passages moist and avoid nosebleeding Continue your fluticasone nasal spray as you have been taking it. Follow with Dr Delton Coombes in 3 months or sooner if you have any problems.

## 2020-12-07 ENCOUNTER — Other Ambulatory Visit: Payer: Self-pay | Admitting: Emergency Medicine

## 2020-12-08 ENCOUNTER — Telehealth: Payer: Self-pay | Admitting: *Deleted

## 2020-12-08 DIAGNOSIS — M179 Osteoarthritis of knee, unspecified: Secondary | ICD-10-CM | POA: Diagnosis not present

## 2020-12-08 DIAGNOSIS — J449 Chronic obstructive pulmonary disease, unspecified: Secondary | ICD-10-CM | POA: Diagnosis not present

## 2020-12-08 DIAGNOSIS — I10 Essential (primary) hypertension: Secondary | ICD-10-CM | POA: Diagnosis not present

## 2020-12-08 DIAGNOSIS — N400 Enlarged prostate without lower urinary tract symptoms: Secondary | ICD-10-CM | POA: Diagnosis not present

## 2020-12-08 DIAGNOSIS — N183 Chronic kidney disease, stage 3 unspecified: Secondary | ICD-10-CM | POA: Diagnosis not present

## 2020-12-08 DIAGNOSIS — E7800 Pure hypercholesterolemia, unspecified: Secondary | ICD-10-CM | POA: Diagnosis not present

## 2020-12-08 NOTE — Telephone Encounter (Signed)
PA request was received from (pharmacy): [facility] Phone:[phone] Fax: [phone] Medication name and strength: Albuterol Sulfate 2.5 mg/3 ml solution Ordering Provider: [place]  Was PA started with CMM?: yes If yes, please enter KEY: [id number] Medication tried and failed: none Covered Alternatives: none listed  PA sent to plan, time frame for approval / denial: [employer] has received your information, and the request will be reviewed. You may close this dialog, return to your dashboard, and perform other tasks.  You will receive an electronic determination in CoverMyMeds. You can see the latest determination by locating this request on your dashboard or by reopening this request. You will also receive a faxed copy of the determination. If you have any questions please contact [employer] at [phone].  If you need assistance, please chat with CoverMyMeds or call us at [phone].  Routing to [place] for follow-up

## 2020-12-10 MED ORDER — ALBUTEROL SULFATE (2.5 MG/3ML) 0.083% IN NEBU: 2.5000 mg | INHALATION_SOLUTION | Freq: Four times a day (QID) | RESPIRATORY_TRACT | 6 refills | Status: AC | PRN

## 2020-12-10 NOTE — Telephone Encounter (Signed)
The PA was denied.  I have sent a new rx into the pharmacy with the dx code on the rx to see if this will help.  Not sure why the albuterol is being denied for so many pts.

## 2020-12-13 DIAGNOSIS — J449 Chronic obstructive pulmonary disease, unspecified: Secondary | ICD-10-CM | POA: Diagnosis not present

## 2020-12-13 DIAGNOSIS — J9611 Chronic respiratory failure with hypoxia: Secondary | ICD-10-CM | POA: Diagnosis not present

## 2020-12-21 ENCOUNTER — Telehealth: Payer: Self-pay | Admitting: Emergency Medicine

## 2020-12-21 NOTE — Telephone Encounter (Signed)
Called and spoke with pts daughter and she stated that the pt has been having issues with his oxygen levels since last night.   His readings on the pulse ox was around 85% last night and that was on 7.5 liters of oxygen.  They did bump his oxygen up to 10 liters this morning and he came up to about 89%.  BP was 113/69 just now.  Pt is ok when he is resting but if he moves at all his sats drop.  She stated that his nose has been bleeding some, but this is due to the high oxygen levels.  He is having some chest congestion but it is painful to cough and he cannot produce anything.  He is having some back pain in the mid to upper area mostly on the left side. He is still doing the nebs and prednisone as directed from last OV.  Denies any fever or any other symptoms.  Pts daughter wanted to see what else needs to be done or just ER for eval?  RB please advise. Thanks

## 2020-12-21 NOTE — Telephone Encounter (Signed)
Called and spoke with pt and he stated that he didn't feel like coming in today.  He is scheduled tomorrow with Dr. Judeth Horn.  Nothing further is needed.

## 2020-12-21 NOTE — Telephone Encounter (Signed)
If he feels progressively worse, continues to have an increased O2 requirement, then I believe he will need to be seen and evaluated - either in our office as an acute visit, or in the ED

## 2020-12-22 ENCOUNTER — Ambulatory Visit: Payer: PPO | Admitting: Pulmonary Disease

## 2020-12-22 ENCOUNTER — Other Ambulatory Visit: Payer: Self-pay

## 2020-12-22 ENCOUNTER — Ambulatory Visit (INDEPENDENT_AMBULATORY_CARE_PROVIDER_SITE_OTHER): Payer: PPO

## 2020-12-22 ENCOUNTER — Telehealth: Payer: Self-pay | Admitting: Pulmonary Disease

## 2020-12-22 ENCOUNTER — Encounter: Payer: Self-pay | Admitting: Pulmonary Disease

## 2020-12-22 VITALS — BP 106/64 | HR 68 | Temp 97.3°F | Ht 71.0 in

## 2020-12-22 DIAGNOSIS — J441 Chronic obstructive pulmonary disease with (acute) exacerbation: Secondary | ICD-10-CM

## 2020-12-22 DIAGNOSIS — J449 Chronic obstructive pulmonary disease, unspecified: Secondary | ICD-10-CM

## 2020-12-22 DIAGNOSIS — J439 Emphysema, unspecified: Secondary | ICD-10-CM | POA: Diagnosis not present

## 2020-12-22 DIAGNOSIS — R7989 Other specified abnormal findings of blood chemistry: Secondary | ICD-10-CM

## 2020-12-22 DIAGNOSIS — R059 Cough, unspecified: Secondary | ICD-10-CM | POA: Diagnosis not present

## 2020-12-22 DIAGNOSIS — R071 Chest pain on breathing: Secondary | ICD-10-CM

## 2020-12-22 DIAGNOSIS — R0600 Dyspnea, unspecified: Secondary | ICD-10-CM | POA: Diagnosis not present

## 2020-12-22 LAB — BASIC METABOLIC PANEL
BUN: 25 mg/dL — ABNORMAL HIGH (ref 6–23)
CO2: 26 mEq/L (ref 19–32)
Calcium: 9.5 mg/dL (ref 8.4–10.5)
Chloride: 104 mEq/L (ref 96–112)
Creatinine, Ser: 1.24 mg/dL (ref 0.40–1.50)
GFR: 53.96 mL/min — ABNORMAL LOW (ref >60.00)
Glucose, Bld: 95 mg/dL (ref 70–99)
Potassium: 4.4 mEq/L (ref 3.5–5.1)
Sodium: 141 mEq/L (ref 135–145)

## 2020-12-22 LAB — D-DIMER, QUANTITATIVE: D-Dimer, Quant: 1.59 mcg/mL FEU — ABNORMAL HIGH (ref <0.50)

## 2020-12-22 MED ORDER — PREDNISONE 20 MG PO TABS: ORAL_TABLET | ORAL | 0 refills | Status: AC

## 2020-12-22 MED ORDER — AMOXICILLIN-POT CLAVULANATE 875-125 MG PO TABS: 1.0000 | ORAL_TABLET | Freq: Two times a day (BID) | ORAL | 0 refills | Status: DC

## 2020-12-22 NOTE — Patient Instructions (Signed)
Take prednisone 40 mg for 5 days, then 20 mg for 5 days, then resume 10 mg daily.  Take Augmentin 1 pill twice a day for 7 days  Chest xray today  We will get labs today to evaluate for pulmonary embolus, if needed we may order A CT chest to further evaluate based on results.

## 2020-12-22 NOTE — Telephone Encounter (Signed)
D-dimer elevated.  Worried about PE prompting COPD exacerbation given chest pain, worsened hypoxemia.  Chest x-ray on my interpretation reveals hyperinflation, chronic scarring versus bronchitic changes in bilateral bases.  Advise patient of result D-dimer.  Ordering CTA PE protocol.  If symptoms worsen, chest pain, hypoxemia, shortness of breath he is to report to the ED immediately for stat CTA.  He expressed understanding.  BMP ordered to assess kidney function still pending.

## 2020-12-22 NOTE — Addendum Note (Signed)
Addended byVilma Meckel on: 12/22/2020 04:11 PM   Modules accepted: Orders

## 2020-12-22 NOTE — Progress Notes (Signed)
@Patient  ID: , male    DOB: 12-24-1935, 85 y.o.   MRN: [id number]  Chief Complaint  Patient presents with  . Acute Visit    RB for increased SOB. Has been using 10L of O2 at home for the past week. Normally uses 6-8L at home. O2 tends to drop with the slightest exertion.     Referring provider: [id number], MD  HPI:   85 year old with severe end-stage COPD on 6 daily as baseline who presents for acute visit with ongoing shortness of breath, cough, worsening hypoxemia.  Most recent pulmonary note reviewed.  1 week of symptoms.  Notes increased cough and sputum production.  Worsening dyspnea on exertion above and beyond his baseline significant dyspnea.  Notes oxygen dropping to 70s using 6 to 8 L.  Oxygen in the low 80s at rest.  On 10 L nasal cannula he can keep his oxygen above 88.  Still having some desaturation with exertion.  No fever chills currently.  Does endorse left posterior back pain inhalation.  Reviewed most recent CT chest (most recent chest imaging) 01/2018 with incredibly severe panlobular emphysema most marked in the upper lobes with interlobular septal thickening and bilateral left greater than right lower lobe suggestive of early fibrosis on my interpretation.    Questionaires / Pulmonary Flowsheets:   ACT:  No flowsheet data found.  MMRC: No flowsheet data found.  Epworth:  No flowsheet data found.  Tests:   FENO:  No results found for: NITRICOXIDE  PFT: PFT Results Latest Ref Rng & Units 12/16/2013  FVC-Pre L 4.23  FVC-Predicted Pre % 96  FVC-Post L 4.44  FVC-Predicted Post % 101  Pre FEV1/FVC % % 52  Post FEV1/FCV % % 53  FEV1-Pre L 2.22  FEV1-Predicted Pre % 70  FEV1-Post L 2.37  DLCO uncorrected ml/min/mmHg 10.84  DLCO UNC% % 32  DLVA Predicted % 35  TLC L 7.29  TLC % Predicted % 100  RV % Predicted % 91  Personally reviewed  WALK:  SIX MIN WALK 12/13/2018 08/29/2018 11/08/2017 05/31/2017 11/11/2013  Supplimental Oxygen  during Test? (L/min) - - - - No  2 Minute Oxygen Saturation % 92 84 88 82 -  2 Minute HR 123 118 123 116 -  4 Minute Oxygen Saturation % 92 97 - 85 -  4 Minute HR 124 118 - 118 -  6 Minute Oxygen Saturation % 94 84 86 87 -  6 Minute HR 122 127 124 113 -  Tech Comments: - - - - Pt did not complete due to SATS dropping to 86%    Imaging: Reviewed as per EMR  Lab Results: Personally reviewed, eosinophils elevated at 300 when last checked 2015 CBC    Component Value Date/Time   WBC 6.8 10/20/2013 1632   RBC 4.85 10/20/2013 1632   HGB 15.8 10/20/2013 1632   HCT 46.3 10/20/2013 1632   PLT 189.0 10/20/2013 1632   MCV 95.4 10/20/2013 1632   MCHC 34.1 10/20/2013 1632   RDW 14.8 (H) 10/20/2013 1632   LYMPHSABS 2.6 10/20/2013 1632   MONOABS 0.4 10/20/2013 1632   EOSABS 0.3 10/20/2013 1632   BASOSABS 0.0 10/20/2013 1632    BMET    Component Value Date/Time   NA 142 10/20/2013 1632   K 4.3 10/20/2013 1632   CL 109 10/20/2013 1632   CO2 27 10/20/2013 1632   GLUCOSE 104 (H) 10/20/2013 1632   BUN 16 10/20/2013 1632   CREATININE 1.5 10/20/2013  1632   CALCIUM 9.2 10/20/2013 1632    BNP No results found for: BNP  ProBNP No results found for: PROBNP  Specialty Problems      Pulmonary Problems   Dyspnea on exertion   COPD (chronic obstructive pulmonary disease) (HCC)   Allergic rhinitis   Chronic cough   Lung nodule   Hypoxemic respiratory failure, chronic (HCC)      No Known Allergies  Immunization History  Administered Date(s) Administered  . Fluad Quad(high Dose 65+) 06/17/2020  . Influenza Split 07/15/2009, 07/16/2013, 08/05/2015  . Influenza, High Dose Seasonal PF 08/08/2017, 07/17/2019  . Influenza,inj,Quad PF,6+ Mos 07/17/2014, 08/14/2016  . Influenza,inj,quad, With Preservative 09/02/2014, 08/24/2015  . Influenza-Unspecified 07/24/2012, 07/04/2013, 08/08/2016, 07/14/2019, 06/11/2020  . PFIZER(Purple Top)SARS-COV-2 Vaccination 11/24/2019, 12/18/2019,  09/28/2020  . Pneumococcal Conjugate-13 01/14/2014, 06/10/2014  . Pneumococcal Polysaccharide-23 07/04/2013, 08/08/2017  . Tdap 11/15/2011  . Zoster 02/19/2014    Past Medical History:  Diagnosis Date  . Abnormal stress test    s/p cardiac cath 10/2013 - nonobstructive CAD with normal LV function  . CKD (chronic kidney disease)   . COPD (chronic obstructive pulmonary disease) (HCC)   . Dyspnea on exertion   . Emphysema lung (HCC)   . GERD (gastroesophageal reflux disease)   . Gout   . HTN (hypertension)   . Hyperlipidemia     Tobacco History: Social History   Tobacco Use  Smoking Status Former Smoker  . Packs/day: 3.00  . Years: 30.00  . Pack years: 90.00  . Types: Cigarettes  . Quit date: 09/03/1989  . Years since quitting: 31.3  Smokeless Tobacco Never Used   Counseling given: Not Answered   Continue to not smoke  Outpatient Encounter Medications as of 12/22/2020  Medication Sig  . acetaminophen (TYLENOL) 650 MG CR tablet Take 650 mg by mouth every 8 (eight) hours as needed for pain.  Marland Kitchen albuterol (PROVENTIL) (2.5 MG/3ML) 0.083% nebulizer solution Take 3 mLs (2.5 mg total) by nebulization every 6 (six) hours as needed for wheezing or shortness of breath.  . allopurinol (ZYLOPRIM) 100 MG tablet Take 100 mg by mouth daily.  Marland Kitchen amoxicillin-clavulanate (AUGMENTIN) 875-125 MG tablet Take 1 tablet by mouth 2 (two) times daily.  Marland Kitchen aspirin 81 MG tablet Take 81 mg by mouth daily.  . B Complex-Folic Acid (SUPER B COMPLEX MAXI PO) Take 1 tablet by mouth daily.   . budesonide (PULMICORT) 0.5 MG/2ML nebulizer solution INHALE THE contents of ONE vial via NEBULIZER TWICE DAILY  . calcium carbonate (OS-CAL) 600 MG TABS tablet Take 600 mg by mouth daily.  . Cinnamon 500 MG capsule Take 1,000 mg by mouth daily.  Marland Kitchen docusate sodium (COLACE) 50 MG capsule Take 50 mg by mouth daily as needed for mild constipation.   . fluorouracil (EFUDEX) 5 % cream Apply 5 % topically 2 (two) times daily  as needed.   . fluticasone (FLONASE) 50 MCG/ACT nasal spray Place 2 sprays into both nostrils 2 (two) times daily.  . Ginkgo Biloba 40 MG TABS Take 40 mg by mouth daily.  Marland Kitchen ipratropium-albuterol (DUONEB) 0.5-2.5 (3) MG/3ML SOLN INHALE 1 VIAL VIA NEBULIZER ROUTE FOUR TIMES A DAY  . irbesartan (AVAPRO) 300 MG tablet Take 1 tablet by mouth daily.  . Misc Natural Products (OSTEO BI-FLEX ADV JOINT SHIELD PO) Take 1,500 mg by mouth daily.  . Misc. Devices (ACAPELLA) MISC Use as directed  . Multiple Vitamin (MULTIVITAMIN) capsule Take 1 capsule by mouth daily.  . Naproxen Sodium (ALEVE PO) Take  220 mg by mouth as needed (for pain).   . niacin 250 MG tablet Take 250 mg by mouth at bedtime.  . predniSONE (DELTASONE) 10 MG tablet TAKE ONE TABLET BY MOUTH ONCE DAILY  . predniSONE (DELTASONE) 20 MG tablet Take 2 tablets (40 mg total) by mouth daily with breakfast for 5 days, THEN 1 tablet (20 mg total) daily with breakfast for 5 days.  . sildenafil (REVATIO) 20 MG tablet Take 1 tablet by mouth daily.  . tamsulosin (FLOMAX) 0.4 MG CAPS capsule Take 1 capsule by mouth daily.  . vitamin B-12 (CYANOCOBALAMIN) 1000 MCG tablet Take 1,000 mcg by mouth daily.  Marland Kitchen zinc gluconate 50 MG tablet Take 50 mg by mouth daily.  Marland Kitchen albuterol (PROAIR HFA) 108 (90 Base) MCG/ACT inhaler Inhale 2 puffs into the lungs every 6 (six) hours as needed for wheezing or shortness of breath.   Facility-Administered Encounter Medications as of 12/22/2020  Medication  . albuterol (PROVENTIL) (2.5 MG/3ML) 0.083% nebulizer solution 2.5 mg     Review of Systems  Review of Systems  No chest pain with exertion.  No orthopnea or PND.  Comprehensive review of systems otherwise negative Physical Exam  BP 106/64   Pulse 68   Temp (!) 97.3 F (36.3 C) (Temporal)   Ht 5\' 11"  (1.803 m)   SpO2 94% Comment: on 8L  BMI 23.85 kg/m   Wt Readings from Last 5 Encounters:  12/01/20 171 lb (77.6 kg)  09/30/20 169 lb 9.6 oz (76.9 kg)  08/17/20  170 lb (77.1 kg)  04/07/20 173 lb (78.5 kg)  02/13/20 172 lb 3.2 oz (78.1 kg)    BMI Readings from Last 5 Encounters:  12/22/20 23.85 kg/m  12/01/20 23.85 kg/m  09/30/20 23.65 kg/m  08/17/20 23.71 kg/m  04/07/20 24.13 kg/m     Physical Exam General: Chronically ill-appearing, thin  eyes: EOMI, no icterus Neck: Supple, no JVP Respiratory: Rhonchi in bilateral bases, decreased throughout, normal work of breathing Abdomen: Nondistended, bowel sounds present Cardiovascular: Regular rhythm, no murmur MSK: No synovitis, joint effusion Neuro: No focal weakness, sensation intact Psych: Normal mood, full affect   Assessment & Plan:   COPD with acute exacerbation: Increase cough, DOE, hypoxemia. CXR today. Prednisone 40 mg daily x 5 days then 20 mg daily x 5 days then resume 10 mg daily. Augmentin BID x 7 days. D-dimer, BMP to evaluate PE and safety of CTA if needed.   Chronic Hypoxemic Respiratory failure: Due to COPD. Worsened in setting of exacerbation. PE eval as above. Possible new baseline. No increase WOB. Goal O2 > 88. Think can treat at home for now.   Next scheduled with Dr. 04/09/20, MD 12/22/2020

## 2020-12-23 ENCOUNTER — Ambulatory Visit (HOSPITAL_COMMUNITY)
Admission: RE | Admit: 2020-12-23 | Discharge: 2020-12-23 | Disposition: A | Discharge status: Home / Self Care | Payer: PPO | Source: Ambulatory Visit | Attending: Pulmonary Disease | Admitting: Pulmonary Disease

## 2020-12-23 ENCOUNTER — Telehealth: Payer: Self-pay | Admitting: Pulmonary Disease

## 2020-12-23 DIAGNOSIS — R071 Chest pain on breathing: Secondary | ICD-10-CM | POA: Insufficient documentation

## 2020-12-23 DIAGNOSIS — R079 Chest pain, unspecified: Secondary | ICD-10-CM | POA: Diagnosis not present

## 2020-12-23 MED ORDER — IOHEXOL 350 MG/ML SOLN
100.0000 mL | Freq: Once | INTRAVENOUS | Status: AC | PRN
  Administered 2020-12-23: 100 mL via INTRAVENOUS

## 2020-12-23 NOTE — Telephone Encounter (Signed)
Spoke with Dr Judeth Horn. Patient is okay to leave office. CT staff to inform patient MD will try contacting him again later today regarding the results.   Obtained another number patient can be reached at [phone] this number goes to his daughter [name].   Will route to Dr Judeth Horn as Lorain Childes.

## 2020-12-23 NOTE — Telephone Encounter (Signed)
Call report from [facility]:  IMPRESSION: 1. Negative for acute pulmonary embolus.  2. Severe Emphysema (ICD10-J43.9) with superimposed acute infectious exacerbation in the left lower lobe, right costophrenic angle. Minimal consolidation on the left. No pleural effusion.  3. Age indeterminate T4 and T5 compression fractures are new since 2019. Underlying osteopenia.  4. Calcified coronary artery and Aortic Atherosclerosis   Patient still in office.

## 2020-12-23 NOTE — Telephone Encounter (Signed)
Discussed CT with daughter. No PE which is good news. Severe emphysema as expected. Increased interstitial markings L lung base consistent with inflammation, possible pneumonia although appearance not classic. Recommend continuing current plan - prednisone taper and Augmentin for COPD exacerbation. Gave precautions and signs to present to ED. Hopeful will see improvement in coming days. She expressed understanding of plan.

## 2020-12-30 NOTE — Telephone Encounter (Signed)
Spoke with Rich at [facility] who states medication is ready to be picked up by patient when ever pt needs. Nothing further needed at this point

## 2021-01-03 ENCOUNTER — Other Ambulatory Visit: Payer: Self-pay | Admitting: Emergency Medicine

## 2021-01-06 ENCOUNTER — Other Ambulatory Visit: Payer: Self-pay | Admitting: Emergency Medicine

## 2021-01-09 DIAGNOSIS — J449 Chronic obstructive pulmonary disease, unspecified: Secondary | ICD-10-CM | POA: Diagnosis not present

## 2021-01-09 DIAGNOSIS — E7800 Pure hypercholesterolemia, unspecified: Secondary | ICD-10-CM | POA: Diagnosis not present

## 2021-01-09 DIAGNOSIS — N183 Chronic kidney disease, stage 3 unspecified: Secondary | ICD-10-CM | POA: Diagnosis not present

## 2021-01-09 DIAGNOSIS — M179 Osteoarthritis of knee, unspecified: Secondary | ICD-10-CM | POA: Diagnosis not present

## 2021-01-09 DIAGNOSIS — I10 Essential (primary) hypertension: Secondary | ICD-10-CM | POA: Diagnosis not present

## 2021-01-09 DIAGNOSIS — N400 Enlarged prostate without lower urinary tract symptoms: Secondary | ICD-10-CM | POA: Diagnosis not present

## 2021-01-11 DIAGNOSIS — J449 Chronic obstructive pulmonary disease, unspecified: Secondary | ICD-10-CM | POA: Diagnosis not present

## 2021-01-11 DIAGNOSIS — J9611 Chronic respiratory failure with hypoxia: Secondary | ICD-10-CM | POA: Diagnosis not present

## 2021-01-12 ENCOUNTER — Telehealth: Payer: Self-pay | Admitting: Pulmonary Disease

## 2021-01-12 MED ORDER — BUDESONIDE 0.5 MG/2ML IN SUSP: 0.5000 mg | Freq: Two times a day (BID) | RESPIRATORY_TRACT | 9 refills | Status: AC

## 2021-01-12 NOTE — Telephone Encounter (Signed)
Pharm is calling  because pt needs refill on Pulmicort.. states they sent over request but it was denied due to refills  already being authorized, Refills are not at [facility]. Also states they called pt old [facility], [facility] but was unable to locate refills please advise  [phone]

## 2021-01-12 NOTE — Telephone Encounter (Signed)
Per pt's med list, Rx for budesonide was sent to [facility] by Dr. Judeth Horn but had print showing instead of Rx being sent electronically to pharmacy.  Called pt's pharmacy and spoke with Leotis Shames about this and stated to her that I would get this sent to pharmacy for pt. Nothing further needed.

## 2021-01-30 ENCOUNTER — Encounter (HOSPITAL_COMMUNITY): Payer: Self-pay

## 2021-01-30 ENCOUNTER — Inpatient Hospital Stay (HOSPITAL_COMMUNITY)
Admission: EM | Admit: 2021-01-30 | Discharge: 2021-02-13 | DRG: 377 | Disposition: E | Payer: PPO | Attending: Internal Medicine | Admitting: Internal Medicine

## 2021-01-30 ENCOUNTER — Emergency Department (HOSPITAL_COMMUNITY): Payer: PPO

## 2021-01-30 DIAGNOSIS — E860 Dehydration: Secondary | ICD-10-CM | POA: Diagnosis not present

## 2021-01-30 DIAGNOSIS — I471 Supraventricular tachycardia: Secondary | ICD-10-CM | POA: Diagnosis not present

## 2021-01-30 DIAGNOSIS — R7989 Other specified abnormal findings of blood chemistry: Secondary | ICD-10-CM

## 2021-01-30 DIAGNOSIS — J439 Emphysema, unspecified: Secondary | ICD-10-CM | POA: Diagnosis not present

## 2021-01-30 DIAGNOSIS — E43 Unspecified severe protein-calorie malnutrition: Secondary | ICD-10-CM | POA: Insufficient documentation

## 2021-01-30 DIAGNOSIS — R54 Age-related physical debility: Secondary | ICD-10-CM | POA: Diagnosis present

## 2021-01-30 DIAGNOSIS — N179 Acute kidney failure, unspecified: Secondary | ICD-10-CM | POA: Diagnosis not present

## 2021-01-30 DIAGNOSIS — K921 Melena: Secondary | ICD-10-CM | POA: Diagnosis not present

## 2021-01-30 DIAGNOSIS — Z66 Do not resuscitate: Secondary | ICD-10-CM | POA: Diagnosis not present

## 2021-01-30 DIAGNOSIS — Z9981 Dependence on supplemental oxygen: Secondary | ICD-10-CM

## 2021-01-30 DIAGNOSIS — Z87891 Personal history of nicotine dependence: Secondary | ICD-10-CM

## 2021-01-30 DIAGNOSIS — G8929 Other chronic pain: Secondary | ICD-10-CM | POA: Diagnosis present

## 2021-01-30 DIAGNOSIS — J9611 Chronic respiratory failure with hypoxia: Secondary | ICD-10-CM | POA: Diagnosis present

## 2021-01-30 DIAGNOSIS — N39498 Other specified urinary incontinence: Secondary | ICD-10-CM | POA: Diagnosis not present

## 2021-01-30 DIAGNOSIS — Z79899 Other long term (current) drug therapy: Secondary | ICD-10-CM

## 2021-01-30 DIAGNOSIS — K219 Gastro-esophageal reflux disease without esophagitis: Secondary | ICD-10-CM | POA: Diagnosis present

## 2021-01-30 DIAGNOSIS — J9621 Acute and chronic respiratory failure with hypoxia: Secondary | ICD-10-CM | POA: Diagnosis not present

## 2021-01-30 DIAGNOSIS — R64 Cachexia: Secondary | ICD-10-CM | POA: Diagnosis present

## 2021-01-30 DIAGNOSIS — M109 Gout, unspecified: Secondary | ICD-10-CM

## 2021-01-30 DIAGNOSIS — N1831 Chronic kidney disease, stage 3a: Secondary | ICD-10-CM | POA: Diagnosis present

## 2021-01-30 DIAGNOSIS — I959 Hypotension, unspecified: Secondary | ICD-10-CM | POA: Diagnosis not present

## 2021-01-30 DIAGNOSIS — I5043 Acute on chronic combined systolic (congestive) and diastolic (congestive) heart failure: Secondary | ICD-10-CM | POA: Diagnosis not present

## 2021-01-30 DIAGNOSIS — Z7951 Long term (current) use of inhaled steroids: Secondary | ICD-10-CM

## 2021-01-30 DIAGNOSIS — A419 Sepsis, unspecified organism: Secondary | ICD-10-CM | POA: Diagnosis not present

## 2021-01-30 DIAGNOSIS — I248 Other forms of acute ischemic heart disease: Secondary | ICD-10-CM | POA: Diagnosis present

## 2021-01-30 DIAGNOSIS — R739 Hyperglycemia, unspecified: Secondary | ICD-10-CM

## 2021-01-30 DIAGNOSIS — N401 Enlarged prostate with lower urinary tract symptoms: Secondary | ICD-10-CM | POA: Diagnosis not present

## 2021-01-30 DIAGNOSIS — M4854XA Collapsed vertebra, not elsewhere classified, thoracic region, initial encounter for fracture: Secondary | ICD-10-CM | POA: Diagnosis present

## 2021-01-30 DIAGNOSIS — Z681 Body mass index (BMI) 19 or less, adult: Secondary | ICD-10-CM

## 2021-01-30 DIAGNOSIS — R0902 Hypoxemia: Secondary | ICD-10-CM | POA: Diagnosis not present

## 2021-01-30 DIAGNOSIS — I429 Cardiomyopathy, unspecified: Secondary | ICD-10-CM | POA: Diagnosis not present

## 2021-01-30 DIAGNOSIS — D72829 Elevated white blood cell count, unspecified: Secondary | ICD-10-CM

## 2021-01-30 DIAGNOSIS — J80 Acute respiratory distress syndrome: Secondary | ICD-10-CM | POA: Diagnosis not present

## 2021-01-30 DIAGNOSIS — R627 Adult failure to thrive: Secondary | ICD-10-CM | POA: Diagnosis present

## 2021-01-30 DIAGNOSIS — E785 Hyperlipidemia, unspecified: Secondary | ICD-10-CM | POA: Diagnosis present

## 2021-01-30 DIAGNOSIS — E872 Acidosis, unspecified: Secondary | ICD-10-CM

## 2021-01-30 DIAGNOSIS — I130 Hypertensive heart and chronic kidney disease with heart failure and stage 1 through stage 4 chronic kidney disease, or unspecified chronic kidney disease: Secondary | ICD-10-CM | POA: Diagnosis present

## 2021-01-30 DIAGNOSIS — N400 Enlarged prostate without lower urinary tract symptoms: Secondary | ICD-10-CM | POA: Diagnosis not present

## 2021-01-30 DIAGNOSIS — R5381 Other malaise: Secondary | ICD-10-CM | POA: Diagnosis present

## 2021-01-30 DIAGNOSIS — I5042 Chronic combined systolic (congestive) and diastolic (congestive) heart failure: Secondary | ICD-10-CM | POA: Diagnosis not present

## 2021-01-30 DIAGNOSIS — Z515 Encounter for palliative care: Secondary | ICD-10-CM

## 2021-01-30 DIAGNOSIS — I10 Essential (primary) hypertension: Secondary | ICD-10-CM | POA: Diagnosis not present

## 2021-01-30 DIAGNOSIS — R0689 Other abnormalities of breathing: Secondary | ICD-10-CM | POA: Diagnosis not present

## 2021-01-30 DIAGNOSIS — Z7189 Other specified counseling: Secondary | ICD-10-CM | POA: Diagnosis not present

## 2021-01-30 DIAGNOSIS — D500 Iron deficiency anemia secondary to blood loss (chronic): Secondary | ICD-10-CM | POA: Diagnosis not present

## 2021-01-30 DIAGNOSIS — Z825 Family history of asthma and other chronic lower respiratory diseases: Secondary | ICD-10-CM

## 2021-01-30 DIAGNOSIS — E875 Hyperkalemia: Secondary | ICD-10-CM | POA: Diagnosis present

## 2021-01-30 DIAGNOSIS — D638 Anemia in other chronic diseases classified elsewhere: Secondary | ICD-10-CM | POA: Diagnosis present

## 2021-01-30 DIAGNOSIS — I4510 Unspecified right bundle-branch block: Secondary | ICD-10-CM | POA: Diagnosis present

## 2021-01-30 DIAGNOSIS — Z20822 Contact with and (suspected) exposure to covid-19: Secondary | ICD-10-CM | POA: Diagnosis not present

## 2021-01-30 DIAGNOSIS — K922 Gastrointestinal hemorrhage, unspecified: Secondary | ICD-10-CM

## 2021-01-30 DIAGNOSIS — Z7982 Long term (current) use of aspirin: Secondary | ICD-10-CM

## 2021-01-30 DIAGNOSIS — J438 Other emphysema: Secondary | ICD-10-CM | POA: Diagnosis not present

## 2021-01-30 DIAGNOSIS — R778 Other specified abnormalities of plasma proteins: Secondary | ICD-10-CM | POA: Diagnosis not present

## 2021-01-30 DIAGNOSIS — J449 Chronic obstructive pulmonary disease, unspecified: Secondary | ICD-10-CM | POA: Diagnosis not present

## 2021-01-30 DIAGNOSIS — R000 Tachycardia, unspecified: Secondary | ICD-10-CM | POA: Diagnosis not present

## 2021-01-30 DIAGNOSIS — R0600 Dyspnea, unspecified: Secondary | ICD-10-CM

## 2021-01-30 DIAGNOSIS — D649 Anemia, unspecified: Secondary | ICD-10-CM

## 2021-01-30 DIAGNOSIS — R0602 Shortness of breath: Secondary | ICD-10-CM | POA: Diagnosis not present

## 2021-01-30 DIAGNOSIS — M179 Osteoarthritis of knee, unspecified: Secondary | ICD-10-CM | POA: Diagnosis not present

## 2021-01-30 DIAGNOSIS — R1310 Dysphagia, unspecified: Secondary | ICD-10-CM | POA: Diagnosis present

## 2021-01-30 DIAGNOSIS — E8809 Other disorders of plasma-protein metabolism, not elsewhere classified: Secondary | ICD-10-CM

## 2021-01-30 DIAGNOSIS — F32A Depression, unspecified: Secondary | ICD-10-CM | POA: Diagnosis present

## 2021-01-30 DIAGNOSIS — I2510 Atherosclerotic heart disease of native coronary artery without angina pectoris: Secondary | ICD-10-CM | POA: Diagnosis present

## 2021-01-30 DIAGNOSIS — N390 Urinary tract infection, site not specified: Secondary | ICD-10-CM

## 2021-01-30 DIAGNOSIS — N189 Chronic kidney disease, unspecified: Secondary | ICD-10-CM | POA: Diagnosis present

## 2021-01-30 DIAGNOSIS — N183 Chronic kidney disease, stage 3 unspecified: Secondary | ICD-10-CM | POA: Diagnosis not present

## 2021-01-30 DIAGNOSIS — R062 Wheezing: Secondary | ICD-10-CM | POA: Diagnosis not present

## 2021-01-30 DIAGNOSIS — Z7952 Long term (current) use of systemic steroids: Secondary | ICD-10-CM

## 2021-01-30 DIAGNOSIS — T380X5A Adverse effect of glucocorticoids and synthetic analogues, initial encounter: Secondary | ICD-10-CM | POA: Diagnosis not present

## 2021-01-30 DIAGNOSIS — D539 Nutritional anemia, unspecified: Secondary | ICD-10-CM

## 2021-01-30 DIAGNOSIS — E7800 Pure hypercholesterolemia, unspecified: Secondary | ICD-10-CM | POA: Diagnosis not present

## 2021-01-30 DIAGNOSIS — D62 Acute posthemorrhagic anemia: Secondary | ICD-10-CM | POA: Diagnosis not present

## 2021-01-30 LAB — COMPREHENSIVE METABOLIC PANEL
ALT: 37 U/L (ref 0–44)
AST: 41 U/L (ref 15–41)
Albumin: 2.5 g/dL — ABNORMAL LOW (ref 3.5–5.0)
Alkaline Phosphatase: 52 U/L (ref 38–126)
Anion gap: 7 (ref 5–15)
BUN: 59 mg/dL — ABNORMAL HIGH (ref 8–23)
CO2: 25 mmol/L (ref 22–32)
Calcium: 7.7 mg/dL — ABNORMAL LOW (ref 8.9–10.3)
Chloride: 107 mmol/L (ref 98–111)
Creatinine, Ser: 1.17 mg/dL (ref 0.61–1.24)
GFR, Estimated: >60 mL/min (ref >60)
Glucose, Bld: 143 mg/dL — ABNORMAL HIGH (ref 70–99)
Potassium: 5.1 mmol/L (ref 3.5–5.1)
Sodium: 139 mmol/L (ref 135–145)
Total Bilirubin: 0.6 mg/dL (ref 0.3–1.2)
Total Protein: 4.8 g/dL — ABNORMAL LOW (ref 6.5–8.1)

## 2021-01-30 LAB — CBC WITH DIFFERENTIAL/PLATELET
Abs Immature Granulocytes: 0.12 10*3/uL — ABNORMAL HIGH (ref 0.00–0.07)
Basophils Absolute: 0 10*3/uL (ref 0.0–0.1)
Basophils Relative: 0 %
Eosinophils Absolute: 0 10*3/uL (ref 0.0–0.5)
Eosinophils Relative: 0 %
HCT: 25.6 % — ABNORMAL LOW (ref 39.0–52.0)
Hemoglobin: 7.9 g/dL — ABNORMAL LOW (ref 13.0–17.0)
Immature Granulocytes: 1 %
Lymphocytes Relative: 19 %
Lymphs Abs: 2.1 10*3/uL (ref 0.7–4.0)
MCH: 32.5 pg (ref 26.0–34.0)
MCHC: 30.9 g/dL (ref 30.0–36.0)
MCV: 105.3 fL — ABNORMAL HIGH (ref 80.0–100.0)
Monocytes Absolute: 0.8 10*3/uL (ref 0.1–1.0)
Monocytes Relative: 7 %
Neutro Abs: 8.1 10*3/uL — ABNORMAL HIGH (ref 1.7–7.7)
Neutrophils Relative %: 73 %
Platelets: 175 10*3/uL (ref 150–400)
RBC: 2.43 MIL/uL — ABNORMAL LOW (ref 4.22–5.81)
RDW: 14.7 % (ref 11.5–15.5)
WBC: 11.2 10*3/uL — ABNORMAL HIGH (ref 4.0–10.5)
nRBC: 0.8 % — ABNORMAL HIGH (ref 0.0–0.2)

## 2021-01-30 LAB — PREPARE RBC (CROSSMATCH)

## 2021-01-30 LAB — URINALYSIS, ROUTINE W REFLEX MICROSCOPIC
Bilirubin Urine: NEGATIVE
Glucose, UA: NEGATIVE mg/dL
Hgb urine dipstick: NEGATIVE
Ketones, ur: NEGATIVE mg/dL
Leukocytes,Ua: NEGATIVE
Nitrite: NEGATIVE
Protein, ur: NEGATIVE mg/dL
Specific Gravity, Urine: 1.024 (ref 1.005–1.030)
pH: 5 (ref 5.0–8.0)

## 2021-01-30 LAB — POC OCCULT BLOOD, ED: Fecal Occult Bld: POSITIVE — AB

## 2021-01-30 LAB — LACTIC ACID, PLASMA
Lactic Acid, Venous: 2.7 mmol/L (ref 0.5–1.9)
Lactic Acid, Venous: 2.4 mmol/L (ref 0.5–1.9)

## 2021-01-30 LAB — PROTIME-INR
INR: 1.2 (ref 0.8–1.2)
Prothrombin Time: 15.5 seconds — ABNORMAL HIGH (ref 11.4–15.2)

## 2021-01-30 LAB — I-STAT VENOUS BLOOD GAS, ED
Acid-base deficit: 2 mmol/L (ref 0.0–2.0)
Bicarbonate: 24.7 mmol/L (ref 20.0–28.0)
Calcium, Ion: 1.06 mmol/L — ABNORMAL LOW (ref 1.15–1.40)
HCT: 23 % — ABNORMAL LOW (ref 39.0–52.0)
Hemoglobin: 7.8 g/dL — ABNORMAL LOW (ref 13.0–17.0)
O2 Saturation: 99 %
Potassium: 5.2 mmol/L — ABNORMAL HIGH (ref 3.5–5.1)
Sodium: 140 mmol/L (ref 135–145)
TCO2: 26 mmol/L (ref 22–32)
pCO2, Ven: 48.9 mmHg (ref 44.0–60.0)
pH, Ven: 7.31 (ref 7.250–7.430)
pO2, Ven: 145 mmHg — ABNORMAL HIGH (ref 32.0–45.0)

## 2021-01-30 LAB — TROPONIN I (HIGH SENSITIVITY)
Troponin I (High Sensitivity): 36 ng/L — ABNORMAL HIGH (ref <18)
Troponin I (High Sensitivity): 37 ng/L — ABNORMAL HIGH (ref <18)

## 2021-01-30 LAB — RESP PANEL BY RT-PCR (FLU A&B, COVID) ARPGX2
Influenza A by PCR: NEGATIVE
Influenza B by PCR: NEGATIVE
SARS Coronavirus 2 by RT PCR: NEGATIVE

## 2021-01-30 LAB — APTT: aPTT: 27 seconds (ref 24–36)

## 2021-01-30 LAB — ABO/RH: ABO/RH(D): O POS

## 2021-01-30 LAB — BRAIN NATRIURETIC PEPTIDE: B Natriuretic Peptide: 132.1 pg/mL — ABNORMAL HIGH (ref 0.0–100.0)

## 2021-01-30 MED ORDER — SODIUM CHLORIDE 0.9% IV SOLUTION: Freq: Once | INTRAVENOUS | Status: DC

## 2021-01-30 MED ORDER — SODIUM CHLORIDE 0.9 % IV SOLN
1.0000 g | INTRAVENOUS | Status: DC
  Administered 2021-01-31: 1 g via INTRAVENOUS
  Filled 2021-01-30: qty 10

## 2021-01-30 MED ORDER — METHYLPREDNISOLONE SODIUM SUCC 125 MG IJ SOLR
125.0000 mg | Freq: Once | INTRAMUSCULAR | Status: AC
  Administered 2021-01-30: 125 mg via INTRAVENOUS
  Filled 2021-01-30: qty 2

## 2021-01-30 MED ORDER — ALBUTEROL SULFATE HFA 108 (90 BASE) MCG/ACT IN AERS
4.0000 | INHALATION_SPRAY | Freq: Once | RESPIRATORY_TRACT | Status: AC
  Administered 2021-01-30: 4 via RESPIRATORY_TRACT
  Filled 2021-01-30: qty 6.7

## 2021-01-30 MED ORDER — SODIUM CHLORIDE 0.9 % IV SOLN
80.0000 mg | Freq: Once | INTRAVENOUS | Status: AC
  Administered 2021-01-30: 80 mg via INTRAVENOUS
  Filled 2021-01-30: qty 80

## 2021-01-30 MED ORDER — PANTOPRAZOLE SODIUM 40 MG IV SOLR
40.0000 mg | Freq: Two times a day (BID) | INTRAVENOUS | Status: DC
  Administered 2021-02-02: 40 mg via INTRAVENOUS
  Filled 2021-01-30 (×2): qty 40

## 2021-01-30 MED ORDER — GLUCERNA SHAKE PO LIQD
237.0000 mL | Freq: Three times a day (TID) | ORAL | Status: DC
  Administered 2021-01-31 – 2021-02-01 (×4): 237 mL via ORAL

## 2021-01-30 MED ORDER — METHYLPREDNISOLONE SODIUM SUCC 40 MG IJ SOLR
40.0000 mg | Freq: Two times a day (BID) | INTRAMUSCULAR | Status: DC
  Administered 2021-01-31: 40 mg via INTRAVENOUS
  Filled 2021-01-30: qty 1

## 2021-01-30 MED ORDER — IPRATROPIUM-ALBUTEROL 0.5-2.5 (3) MG/3ML IN SOLN: 3.0000 mL | RESPIRATORY_TRACT | Status: DC | PRN

## 2021-01-30 MED ORDER — DM-GUAIFENESIN ER 30-600 MG PO TB12
1.0000 | ORAL_TABLET | Freq: Two times a day (BID) | ORAL | Status: DC
  Administered 2021-01-30 – 2021-02-08 (×17): 1 via ORAL
  Filled 2021-01-30 (×18): qty 1

## 2021-01-30 MED ORDER — SODIUM CHLORIDE 0.9 % IV SOLN
1.0000 g | Freq: Once | INTRAVENOUS | Status: AC
  Administered 2021-01-30: 1 g via INTRAVENOUS
  Filled 2021-01-30: qty 10

## 2021-01-30 MED ORDER — SODIUM CHLORIDE 0.9 % IV SOLN
8.0000 mg/h | INTRAVENOUS | Status: DC
  Administered 2021-01-30 – 2021-02-02 (×7): 8 mg/h via INTRAVENOUS
  Filled 2021-01-30 (×9): qty 80

## 2021-01-30 NOTE — ED Notes (Signed)
Report called. Pt taken upstaris on monitorinf devices 10L o2 via [place]. Blood infusing and protonix. A/0 x4. Pleasant and joking w/ RN.

## 2021-01-30 NOTE — ED Provider Notes (Signed)
[facility] EMERGENCY DEPARTMENT Provider Note   CSN: [id number] Arrival date & time: 01/23/2021  1605     History Chief Complaint  Patient presents with  . Code Sepsis    Charles Archer is a 85 y.o. male.  The history is provided by the patient, the EMS personnel and medical records.   Charles Archer is a 85 y.o. male who presents to the Emergency Department complaining of code sepsis. Level V caveat due to acuity of condition. History is provided by EMS. EMS was called out to the house for weakness. He has been urinating himself since Friday. He has COPD and is on baseline 10 L of oxygen. They state when he went to sit up he had respiratory distress with wheezes and crackles, resolved with lying supine. They had an total CO2 of 15 and he was treated with 1 L of normal saline prior to ED presentation. Per report he is lethargic as well. He lives at home with family.  Patient states that he has been more short of breath for the last several days. He states he has been urinating on himself because he has not been able to get out of bed to use the bathroom. He reports increased cough. Denies any chest pain, abdominal pain. He has chronic back pain, this is slightly worse. He did have diarrhea on Friday with black stool with a small amount of blood mixed in. No bowel movement today.    Past Medical History:  Diagnosis Date  . Abnormal stress test    s/p cardiac cath 10/2013 - nonobstructive CAD with normal LV function  . CKD (chronic kidney disease)   . COPD (chronic obstructive pulmonary disease) (HCC)   . Dyspnea on exertion   . Emphysema lung (HCC)   . Emphysema lung (HCC)   . GERD (gastroesophageal reflux disease)   . Gout   . HTN (hypertension)   . Hyperlipidemia     Patient Active Problem List   Diagnosis Date Noted  . Upper GI bleed 01/27/2021  . Physical deconditioning 07/31/2018  . Hypoxemic respiratory failure, chronic (HCC) 02/15/2017  . Lung nodule  01/19/2017  . Allergic rhinitis 03/24/2015  . Chronic cough 03/24/2015  . COPD (chronic obstructive pulmonary disease) (HCC) 12/16/2013  . Abnormal nuclear stress test 10/20/2013  . HTN (hypertension)   . Hyperlipidemia   . Dyspnea on exertion     Past Surgical History:  Procedure Laterality Date  . APPENDECTOMY    . HERNIA REPAIR    . LEFT HEART CATHETERIZATION WITH CORONARY ANGIOGRAM N/A 10/22/2013   Procedure: LEFT HEART CATHETERIZATION WITH CORONARY ANGIOGRAM;  Surgeon: Peter M Swazil[place], MD;  Location: [facility] CATH LAB;  Service: Cardiovascular;  Laterality: N/A;       Family History  Problem Relation Age of Onset  . Emphysema Father   . Asthma Father     Social History   Tobacco Use  . Smoking status: Former Smoker    Packs/day: 3.00    Years: 30.00    Pack years: 90.00    Types: Cigarettes    Quit date: 09/03/1989    Years since quitting: 31.4  . Smokeless tobacco: Never Used  Vaping Use  . Vaping Use: Never used  Substance Use Topics  . Alcohol use: No  . Drug use: No    Home Medications Prior to Admission medications   Medication Sig Start Date End Date Taking? Authorizing Provider  acetaminophen (TYLENOL) 650 MG CR tablet Take 650  mg by mouth every 8 (eight) hours as needed for pain.   Yes [provider]  albuterol (PROVENTIL) (2.5 MG/3ML) 0.083% nebulizer solution Take 3 mLs (2.5 mg total) by nebulization every 6 (six) hours as needed for wheezing or shortness of breath. 12/10/20  Yes Byrum, Les Pouobert S, MD  allopurinol (ZYLOPRIM) 100 MG tablet Take 100 mg by mouth daily.   Yes [provider]  aspirin 81 MG tablet Take 81 mg by mouth daily.   Yes [provider]  B Complex-Folic Acid (SUPER B COMPLEX MAXI PO) Take 1 tablet by mouth daily.    Yes [provider]  budesonide (PULMICORT) 0.5 MG/2ML nebulizer solution Take 2 mLs (0.5 mg total) by nebulization in the morning and at bedtime. 01/12/21  Yes Hunsucker, Lesia SagoMatthew R, MD   calcium carbonate (OS-CAL) 600 MG TABS tablet Take 600 mg by mouth daily.   Yes [provider]  Cinnamon 500 MG capsule Take 1,000 mg by mouth daily.   Yes [provider]  docusate sodium (COLACE) 50 MG capsule Take 50 mg by mouth daily as needed for mild constipation.    Yes [provider]  fluorouracil (EFUDEX) 5 % cream Apply 5 % topically 2 (two) times daily as needed.    Yes [provider]  fluticasone (FLONASE) 50 MCG/ACT nasal spray Place 2 sprays into both nostrils 2 (two) times daily. 11/18/20  Yes Leslye PeerByrum, Robert S, MD  Ginkgo Biloba 40 MG TABS Take 40 mg by mouth daily.   Yes [provider]  ipratropium-albuterol (DUONEB) 0.5-2.5 (3) MG/3ML SOLN INHALE 1 VIAL VIA NEBULIZER ROUTE FOUR TIMES A DAY 10/21/20  Yes Byrum, Les Pouobert S, MD  irbesartan (AVAPRO) 300 MG tablet Take 1 tablet by mouth daily. 03/29/16  Yes [provider]  Misc Natural Products (OSTEO BI-FLEX ADV JOINT SHIELD PO) Take 1,500 mg by mouth daily.   Yes [provider]  Misc. Devices (ACAPELLA) MISC Use as directed 08/08/17  Yes Byrum, Les Pouobert S, MD  Multiple Vitamin (MULTIVITAMIN) capsule Take 1 capsule by mouth daily.   Yes [provider]  niacin 250 MG tablet Take 250 mg by mouth at bedtime.   Yes [provider]  predniSONE (DELTASONE) 10 MG tablet TAKE ONE TABLET BY MOUTH ONCE DAILY 12/07/20  Yes Leslye PeerByrum, Robert S, MD  sildenafil (REVATIO) 20 MG tablet Take 1 tablet by mouth daily. 03/29/16  Yes [provider]  tamsulosin (FLOMAX) 0.4 MG CAPS capsule Take 1 capsule by mouth daily. 03/22/15  Yes [provider]  vitamin B-12 (CYANOCOBALAMIN) 1000 MCG tablet Take 1,000 mcg by mouth daily.   Yes [provider]  zinc gluconate 50 MG tablet Take 50 mg by mouth daily.   Yes [provider]  albuterol (PROAIR HFA) 108 (90 Base) MCG/ACT inhaler Inhale 2 puffs into the lungs every 6 (six) hours as needed for wheezing or  shortness of breath. 07/25/19 08/17/20  Leslye PeerByrum, Robert S, MD  amoxicillin-clavulanate (AUGMENTIN) 875-125 MG tablet Take 1 tablet by mouth 2 (two) times daily. Patient not taking: Reported on 02/12/2021 12/22/20   Hunsucker, Lesia SagoMatthew R, MD    Allergies    Patient has no known allergies.  Review of Systems   Review of Systems  All other systems reviewed and are negative.   Physical Exam Updated Vital Signs BP 123/74   Pulse (!) 113   Temp 98.2 F (36.8 C) (Oral)   Resp (!) 22   SpO2 100%   Physical Exam  Vitals and nursing note reviewed.  Constitutional:      General: He is in acute distress.     Appearance: He is well-developed. He is ill-appearing.  HENT:     Head: Normocephalic and atraumatic.  Cardiovascular:     Rate and Rhythm: Regular rhythm. Tachycardia present.     Heart sounds: No murmur heard.   Pulmonary:     Effort: No respiratory distress.     Comments: Decreased air movement bilaterally with occasional  Wheezes. Tachypnea Abdominal:     Palpations: Abdomen is soft.     Tenderness: There is no abdominal tenderness. There is no guarding or rebound.  Musculoskeletal:        General: No tenderness.     Comments: Trace edema to bilateral lower extremities. Lower extremities are cool to the touch with delayed cap refill. 2+ pedal pulses bilaterally  Skin:    General: Skin is warm and dry.     Coloration: Skin is pale.  Neurological:     Mental Status: He is alert and oriented to person, place, and time.  Psychiatric:        Behavior: Behavior normal.     ED Results / Procedures / Treatments   Labs (all labs ordered are listed, but only abnormal results are displayed) Labs Reviewed  LACTIC ACID, PLASMA - Abnormal; Notable for the following components:      Result Value   Lactic Acid, Venous 2.7 (*)    All other components within normal limits  LACTIC ACID, PLASMA - Abnormal; Notable for the following components:   Lactic Acid, Venous 2.4 (*)    All other  components within normal limits  COMPREHENSIVE METABOLIC PANEL - Abnormal; Notable for the following components:   Glucose, Bld 143 (*)    BUN 59 (*)    Calcium 7.7 (*)    Total Protein 4.8 (*)    Albumin 2.5 (*)    All other components within normal limits  CBC WITH DIFFERENTIAL/PLATELET - Abnormal; Notable for the following components:   WBC 11.2 (*)    RBC 2.43 (*)    Hemoglobin 7.9 (*)    HCT 25.6 (*)    MCV 105.3 (*)    nRBC 0.8 (*)    Neutro Abs 8.1 (*)    Abs Immature Granulocytes 0.12 (*)    All other components within normal limits  PROTIME-INR - Abnormal; Notable for the following components:   Prothrombin Time 15.5 (*)    All other components within normal limits  BRAIN NATRIURETIC PEPTIDE - Abnormal; Notable for the following components:   B Natriuretic Peptide 132.1 (*)    All other components within normal limits  I-STAT VENOUS BLOOD GAS, ED - Abnormal; Notable for the following components:   pO2, Ven 145.0 (*)    Potassium 5.2 (*)    Calcium, Ion 1.06 (*)    HCT 23.0 (*)    Hemoglobin 7.8 (*)    All other components within normal limits  POC OCCULT BLOOD, ED - Abnormal; Notable for the following components:   Fecal Occult Bld POSITIVE (*)    All other components within normal limits  TROPONIN I (HIGH SENSITIVITY) - Abnormal; Notable for the following components:   Troponin I (High Sensitivity) 36 (*)    All other components within normal limits  TROPONIN I (HIGH SENSITIVITY) - Abnormal; Notable for the following components:   Troponin I (High Sensitivity) 37 (*)    All other components within normal limits  RESP PANEL  BY RT-PCR (FLU A&B, COVID) ARPGX2  CULTURE, BLOOD (ROUTINE X 2)  CULTURE, BLOOD (ROUTINE X 2)  URINE CULTURE  APTT  URINALYSIS, ROUTINE W REFLEX MICROSCOPIC  TYPE AND SCREEN  ABO/RH  PREPARE RBC (CROSSMATCH)    EKG EKG Interpretation  Date/Time:  Sunday Feb 23, 2021 16:11:26 EDT Ventricular Rate:  114 PR Interval:    QRS  Duration: 145 QT Interval:  355 QTC Calculation: 465 R Axis:   210 Text Interpretation: Sinus tachycardia Ventricular bigeminy Right bundle branch block Anteroseptal infarct, age indeterminate Interpretation limited secondary to artifact Confirmed by Tilden Fossa [phone]) on 23-Feb-2021 4:55:28 PM   Radiology DG Chest Port 1 View  Result Date: 2021-02-23 CLINICAL DATA:  Sepsis EXAM: PORTABLE CHEST 1 VIEW COMPARISON:  12/22/2020 FINDINGS: The lungs are hyperinflated and there is a paucity of vasculature within the lung apices in keeping with changes of severe emphysema, similar to that noted on prior examination. No superimposed focal pulmonary infiltrate. No pneumothorax or pleural effusion. Cardiac size within normal limits. No acute bone abnormality. IMPRESSION: No active disease.  Severe emphysema. Electronically Signed   By: Helyn Numbers MD   On: 2021/02/23 17:28    Procedures Procedures  CRITICAL CARE Performed by: Tilden Fossa   Total critical care time: 45 minutes  Critical care time was exclusive of separately billable procedures and treating other patients.  Critical care was necessary to treat or prevent imminent or life-threatening deterioration.  Critical care was time spent personally by me on the following activities: development of treatment plan with patient and/or surrogate as well as nursing, discussions with consultants, evaluation of patient's response to treatment, examination of patient, obtaining history from patient or surrogate, ordering and performing treatments and interventions, ordering and review of laboratory studies, ordering and review of radiographic studies, pulse oximetry and re-evaluation of patient's condition.  Medications Ordered in ED Medications  pantoprazole (PROTONIX) 80 mg in sodium chloride 0.9 % 100 mL (0.8 mg/mL) infusion (8 mg/hr Intravenous New Bag/Given 2021-02-23 1850)  pantoprazole (PROTONIX) injection 40 mg (has no administration in  time range)  0.9 %  sodium chloride infusion (Manually program via Guardrails IV Fluids) (has no administration in time range)  albuterol (VENTOLIN HFA) 108 (90 Base) MCG/ACT inhaler 4 puff (4 puffs Inhalation Given Feb 23, 2021 1642)  methylPREDNISolone sodium succinate (SOLU-MEDROL) 125 mg/2 mL injection 125 mg (125 mg Intravenous Given Feb 23, 2021 1639)  cefTRIAXone (ROCEPHIN) 1 g in sodium chloride 0.9 % 100 mL IVPB (0 g Intravenous Stopped 02/23/2021 1745)  pantoprazole (PROTONIX) 80 mg in sodium chloride 0.9 % 100 mL IVPB (0 mg Intravenous Stopped Feb 23, 2021 1848)    ED Course  I have reviewed the triage vital signs and the nursing notes.  Pertinent labs & imaging results that were available during my care of the patient were reviewed by me and considered in my medical decision making (see chart for details).    MDM Rules/Calculators/A&P                          Patient here for evaluation of urinary incontinence, shortness of breath. He is ill appearing on evaluation with increased work of breathing, tachycardia, pallor. He was treated with antibiotics for possible UTI versus pneumonia as well as steroids and albuterol for possible COPD exacerbation during initial workup. Labs returned with hemoglobin of 7.9. On additional questioning patient does report black stool several days ago. He does have melena on examination concerning for acute G.I. bleed. He  also has elevation in bun compared to prior. Discussed with patient concern for upper G.I. bleed, agrees to transfusion of PRBC. Will start Protonix. Hospitalist consulted for admission for ongoing treatment.  Final Clinical Impression(s) / ED Diagnoses Final diagnoses:  Acute upper GI bleed    Rx / DC Orders ED Discharge Orders    None       Tilden Fossa, MD 01/27/2021 2010

## 2021-01-30 NOTE — ED Notes (Signed)
Assuming care. Blood started per protocol. Pt states consent alrady obtained. Denied questions or concerns regarding blood. RN stayed w/ pt for monitoring. Denied s/s of rx. Hospitalist  @ bedside discussing plan of care. Pt on 10L HFNC. Wears @ baseline. Pt resting in bed @ this time w/ NAD noted./Cardiac monitoring in place w/ bp cycling and spo2 being monnitored. Updated on plan of care. Deny needs or concerns @ this time. Bed low and locked. Side rails up x2

## 2021-01-30 NOTE — ED Triage Notes (Signed)
Family called 911 due to Friday, pt started having urinary incontinence. Pt was lethargic, wheezing with sob, tachycardic at 110. Called as a code sepsis. IVF initiated.

## 2021-01-30 NOTE — H&P (Signed)
History and Physical  MAK BONNY RCB:[id number] DOB: 10-29-1935 DOA: 02/01/2021  Referring physician: Quintella Reichert, MD PCP: Lawerance Cruel, MD  Patient coming from: Home  Chief Complaint: Urinary incontinence and black stool  HPI: Charles Archer is a 85 y.o. male with medical history significant for COPD on 10 L of oxygen via HFNC, BPH, hypertension, hyperlipidemia, gout who presents to the emergency department due to suspicion for sepsis.  Patient complained of increased work of breathing on exertion since Friday (4/15), he endorsed urinary incontinence while on his wheelchair on Thursday (4/14), patient states that apart from the urinary incontinence, it was also difficult for him to make it to the bathroom due to increased shortness of breath and weakness, he also noted that his stool was black.  Appetite has decreased since onset of symptoms on Friday.  Family activated EMS today due to patient being lethargic and suspicion for sepsis, on arrival of EMS team, patient was reported to be in respiratory distress with wheezes and crackles, which resolved with lying supine.  He was also reported to be hypotensive, so 1 L of NS was given in route.  Patient endorsed increased cough with occasional clear phlegm and chronic mid back pain due to T3-T4  compression fracture, but denies fever, chills, chest pain, abdominal pain.  ED Course:  In the emergency department, he was tachycardic and tachypneic.  Work-up in the ED showed leukocytosis, macrocytic anemia, BUN was elevated at 59, CBG of 43, lactic acid 2.7, BNP 132.1, FOBT was positive.  Albumin 2.5.  Influenza A, B and SARS coronavirus 2 was negative. Chest x-ray showed severe emphysema with no active disease Breathing treatment was provided, Solu-Medrol was given.  Patient was started on IV ceftriaxone due to presumed urosepsis.  Patient was started on IV Protonix drip.  Hospitalist was asked to admit patient for further evaluation and  management.   Review of Systems: Constitutional: Negative for chills and fever.  HENT: Negative for ear pain and sore throat.   Eyes: Negative for pain and visual disturbance.  Respiratory: Positive for cough and shortness of breath.   Cardiovascular: Negative for chest pain and palpitations.  Gastrointestinal: Positive for black stool.  Negative for abdominal pain and vomiting.  Endocrine: Negative for polyphagia and polyuria.  Genitourinary: Positive for urinary incontinence.  Negative for decreased urine volume, dysuria musculoskeletal: Positive for back pain.  Negative for arthralgias  Skin: Negative for color change and rash.  Allergic/Immunologic: Negative for immunocompromised state.  Neurological: Negative for tremors, syncope, speech difficulty, weakness, light-headedness and headaches.  Hematological: Does not bruise/bleed easily.  All other systems reviewed and are negative   Past Medical History:  Diagnosis Date  . Abnormal stress test    s/p cardiac cath 10/2013 - nonobstructive CAD with normal LV function  . CKD (chronic kidney disease)   . COPD (chronic obstructive pulmonary disease) ([place])   . Dyspnea on exertion   . Emphysema lung ([place])   . Emphysema lung ([place])   . GERD (gastroesophageal reflux disease)   . Gout   . HTN (hypertension)   . Hyperlipidemia    Past Surgical History:  Procedure Laterality Date  . APPENDECTOMY    . HERNIA REPAIR    . LEFT HEART CATHETERIZATION WITH CORONARY ANGIOGRAM N/A 10/22/2013   Procedure: LEFT HEART CATHETERIZATION WITH CORONARY ANGIOGRAM;  Surgeon: Peter M Martinique, MD;  Location: [facility] CATH LAB;  Service: Cardiovascular;  Laterality: N/A;    Social History:  reports that he quit  smoking about 31 years ago. His smoking use included cigarettes. He has a 90.00 pack-year smoking history. He has never used smokeless tobacco. He reports that he does not drink alcohol and does not use drugs.   No Known Allergies  Family History   Problem Relation Age of Onset  . Emphysema Father   . Asthma Father      Prior to Admission medications   Medication Sig Start Date End Date Taking? Authorizing Provider  acetaminophen (TYLENOL) 650 MG CR tablet Take 650 mg by mouth every 8 (eight) hours as needed for pain.   Yes [provider]  albuterol (PROVENTIL) (2.5 MG/3ML) 0.083% nebulizer solution Take 3 mLs (2.5 mg total) by nebulization every 6 (six) hours as needed for wheezing or shortness of breath. 12/10/20  Yes Byrum, Rose Fillers, MD  allopurinol (ZYLOPRIM) 100 MG tablet Take 100 mg by mouth daily.   Yes [provider]  aspirin 81 MG tablet Take 81 mg by mouth daily.   Yes [provider]  B Complex-Folic Acid (SUPER B COMPLEX MAXI PO) Take 1 tablet by mouth daily.    Yes [provider]  budesonide (PULMICORT) 0.5 MG/2ML nebulizer solution Take 2 mLs (0.5 mg total) by nebulization in the morning and at bedtime. 01/12/21  Yes Hunsucker, Bonna Gains, MD  calcium carbonate (OS-CAL) 600 MG TABS tablet Take 600 mg by mouth daily.   Yes [provider]  Cinnamon 500 MG capsule Take 1,000 mg by mouth daily.   Yes [provider]  docusate sodium (COLACE) 50 MG capsule Take 50 mg by mouth daily as needed for mild constipation.    Yes [provider]  fluorouracil (EFUDEX) 5 % cream Apply 5 % topically 2 (two) times daily as needed.    Yes [provider]  fluticasone (FLONASE) 50 MCG/ACT nasal spray Place 2 sprays into both nostrils 2 (two) times daily. 11/18/20  Yes Collene Gobble, MD  Ginkgo Biloba 40 MG TABS Take 40 mg by mouth daily.   Yes [provider]  ipratropium-albuterol (DUONEB) 0.5-2.5 (3) MG/3ML SOLN INHALE 1 VIAL VIA NEBULIZER ROUTE FOUR TIMES A DAY 10/21/20  Yes Byrum, Rose Fillers, MD  irbesartan (AVAPRO) 300 MG tablet Take 1 tablet by mouth daily. 03/29/16  Yes [provider]  Misc Natural Products (OSTEO BI-FLEX ADV JOINT SHIELD PO) Take  1,500 mg by mouth daily.   Yes [provider]  Misc. Devices (ACAPELLA) MISC Use as directed 08/08/17  Yes Byrum, Rose Fillers, MD  Multiple Vitamin (MULTIVITAMIN) capsule Take 1 capsule by mouth daily.   Yes [provider]  niacin 250 MG tablet Take 250 mg by mouth at bedtime.   Yes [provider]  predniSONE (DELTASONE) 10 MG tablet TAKE ONE TABLET BY MOUTH ONCE DAILY 12/07/20  Yes Collene Gobble, MD  sildenafil (REVATIO) 20 MG tablet Take 1 tablet by mouth daily. 03/29/16  Yes [provider]  tamsulosin (FLOMAX) 0.4 MG CAPS capsule Take 1 capsule by mouth daily. 03/22/15  Yes [provider]  vitamin B-12 (CYANOCOBALAMIN) 1000 MCG tablet Take 1,000 mcg by mouth daily.   Yes [provider]  zinc gluconate 50 MG tablet Take 50 mg by mouth daily.   Yes [provider]  albuterol (PROAIR HFA) 108 (90 Base) MCG/ACT inhaler Inhale 2 puffs into the lungs every 6 (six) hours as needed for wheezing or shortness of breath. 07/25/19 08/17/20  Collene Gobble, MD  amoxicillin-clavulanate (AUGMENTIN) [phone]  MG tablet Take 1 tablet by mouth 2 (two) times daily. Patient not taking: Reported on 02/03/2021 12/22/20   Hunsucker, Bonna Gains, MD    Physical Exam: BP 104/79   Pulse (!) 102   Temp 97.7 F (36.5 C) (Oral)   Resp (!) 34   SpO2 98%   . General: 85 y.o. year-old male ill-appearing and in mild acute distress.  Alert and oriented x3. Marland Kitchen HEENT: NCAT, EOMI . Neck: Supple, trachea medial . Cardiovascular: Tachycardic.  Regular rate and rhythm with no rubs or gallops.  No thyromegaly or JVD noted. 2/4 pulses in all 4 extremities. Marland Kitchen Respiratory: Tachypnea, bilateral decreased breath sounds.  Patient stops to catch his breath in between sentences. . Abdomen: Soft nontender nondistended with normal bowel sounds x4 quadrants. . Muskuloskeletal: Trace edema bilaterally in lower extremities.  RLE > LLE.  Marland Kitchen Neuro: CN II-XII intact, sensation, reflexes  intact . Skin: No ulcerative lesions noted or rashes . Psychiatry: Judgement and insight appear normal. Mood is appropriate for condition and setting          Labs on Admission:  Basic Metabolic Panel: Recent Labs  Lab 01/26/2021 1625 01/27/2021 1729  NA 139 140  K 5.1 5.2*  CL 107  --   CO2 25  --   GLUCOSE 143*  --   BUN 59*  --   CREATININE 1.17  --   CALCIUM 7.7*  --    Liver Function Tests: Recent Labs  Lab 01/14/2021 1625  AST 41  ALT 37  ALKPHOS 52  BILITOT 0.6  PROT 4.8*  ALBUMIN 2.5*   No results for input(s): LIPASE, AMYLASE in the last 168 hours. No results for input(s): AMMONIA in the last 168 hours. CBC: Recent Labs  Lab 01/16/2021 1625 01/19/2021 1729  WBC 11.2*  --   NEUTROABS 8.1*  --   HGB 7.9* 7.8*  HCT 25.6* 23.0*  MCV 105.3*  --   PLT 175  --    Cardiac Enzymes: No results for input(s): CKTOTAL, CKMB, CKMBINDEX, TROPONINI in the last 168 hours.  BNP (last 3 results) Recent Labs    02/01/2021 1626  BNP 132.1*    ProBNP (last 3 results) No results for input(s): PROBNP in the last 8760 hours.  CBG: No results for input(s): GLUCAP in the last 168 hours.  Radiological Exams on Admission: DG Chest Port 1 View  Result Date: 01/25/2021 CLINICAL DATA:  Sepsis EXAM: PORTABLE CHEST 1 VIEW COMPARISON:  12/22/2020 FINDINGS: The lungs are hyperinflated and there is a paucity of vasculature within the lung apices in keeping with changes of severe emphysema, similar to that noted on prior examination. No superimposed focal pulmonary infiltrate. No pneumothorax or pleural effusion. Cardiac size within normal limits. No acute bone abnormality. IMPRESSION: No active disease.  Severe emphysema. Electronically Signed   By: Fidela Salisbury MD   On: 01/24/2021 17:28    EKG: I independently viewed the EKG done and my findings are as followed: Sinus tachycardia at rate of 114 bpm with RBBB    Assessment/Plan Present on Admission: . Upper GI bleed . COPD  (chronic obstructive pulmonary disease) ([place]) . Hyperlipidemia . HTN (hypertension) . Hypoxemic respiratory failure, chronic ([place]) . Physical deconditioning  Principal Problem:   Upper GI bleed Active Problems:   HTN (hypertension)   Hyperlipidemia   COPD (chronic obstructive pulmonary disease) ([facility])   Hypoxemic respiratory failure, chronic ([facility])   Physical deconditioning   Sepsis secondary to UTI ([facility])   Lactic  acidosis   Acute dehydration   Hypoalbuminemia   Hyperglycemia   Leukocytosis   Macrocytic anemia   Elevated brain natriuretic peptide (BNP) level   Gout   Elevated troponin   Symptomatic anemia   Presumed sepsis secondary to UTI Patient was tachypneic and tachycardic (met SIRS criteria), he complained of sudden onset of urinary incontinence (possible source of infection)-met sepsis criteria, though urinalysis was unimpressive for UTI. He was empirically started on IV ceftriaxone, we shall continue with same at this time with plan to de-escalate/discontinue based on urine culture Urine culture and blood culture pending  Lactic acidosis possibly secondary to multifactorial including above, hypoxia, dehydration Lactic acid 2.7> 2.4, continue to trend lactic acid  Upper GI bleed Symptomatic anemia/Macrocytic Anemia H/H= 7.9/25.6, this was 15.8/46.3 (about 7 years ago; most recent lab for comparison) Hemoccult was positive Type and crossmatch was done 1 units of PRBC was started in the ED Continue IV Protonix drip Continue vitamin B12 Vitamin B12 and folate levels will be checked Gastroenterologist was consulted and we shall await further recommendation.  Chronic respiratory failure with hypoxia secondary to COPD Continue duo nebs, Mucinex, Solu-Medrol, albuterol, Pulmicort Continue Protonix to prevent steroid-induced ulcer Continue incentive spirometry and flutter valve Continue supplemental oxygen to maintain O2 sat > 92%   Elevated BNP r/o CHF BNP 132.1;  patient does not appear fluid overloaded Continue total input/output, daily weights and fluid restriction Continue Cardiac diet  Echocardiogram in the morning   Elevated troponin secondary to type II demand ischemia Troponin x2- 36 > 37; patient denies chest pain, continue to trend troponin  Acute dehydration IV hydration was provided in route to the ED by EMS team  Leukocytosis/Hyperglycemia possibly related to prednisone effect WBC 11.7, CBG 143.  Patient is chronically on prednisone at home Continue to monitor WBC and CBG with morning labs  Hypoalbuminemia possibly secondary to moderate protein calorie malnutrition Protein supplement will be provided  Hypertension Continue Avapro  Hyperlipidemia Continue Niacin  Chronic back pain Continue Tylenol as needed  BPH Continue Flomax  Gout Continue allopurinol   DVT prophylaxis: Lovenox  Code Status: Full code  Family Communication: Daughter at bedside, all questions answered to satisfaction)  Disposition Plan:  Patient is from:                        home Anticipated DC to:                   SNF or family members home Anticipated DC date:               2-3 days Anticipated DC barriers:           Patient is unstable to be discharged due to sepsis secondary to UTI and GI bleed pending gastroenterology consult and recommendation   Consults called: [facility] gastroenterology (Dr. Watt Climes)  Admission status: Inpatient    Bernadette Hoit MD [facility]  02/05/2021, 8:59 PM

## 2021-01-30 NOTE — Progress Notes (Signed)
Patient arrived to 2w bed 36, transferred from stretcher to bed successfully. No complaints from patient at this time. Patient is stable with blood infusing but does display work of breathing. Vital signs taken and they are stable. Will proceed with infusion and care as directed. Doctor will be notified for any changes.

## 2021-01-30 NOTE — H&P (Incomplete)
History and Physical  MAK BONNY RCB:[id number] DOB: 10-29-1935 DOA: 01/29/2021  Referring physician: Quintella Reichert, MD PCP: Lawerance Cruel, MD  Patient coming from: Home  Chief Complaint: Urinary incontinence and black stool  HPI: Charles Archer is a 85 y.o. male with medical history significant for COPD on 10 L of oxygen via HFNC, BPH, hypertension, hyperlipidemia, gout who presents to the emergency department due to suspicion for sepsis.  Patient complained of increased work of breathing on exertion since Friday (4/15), he endorsed urinary incontinence while on his wheelchair on Thursday (4/14), patient states that apart from the urinary incontinence, it was also difficult for him to make it to the bathroom due to increased shortness of breath and weakness, he also noted that his stool was black.  Appetite has decreased since onset of symptoms on Friday.  Family activated EMS today due to patient being lethargic and suspicion for sepsis, on arrival of EMS team, patient was reported to be in respiratory distress with wheezes and crackles, which resolved with lying supine.  He was also reported to be hypotensive, so 1 L of NS was given in route.  Patient endorsed increased cough with occasional clear phlegm and chronic mid back pain due to T3-T4  compression fracture, but denies fever, chills, chest pain, abdominal pain.  ED Course:  In the emergency department, he was tachycardic and tachypneic.  Work-up in the ED showed leukocytosis, macrocytic anemia, BUN was elevated at 59, CBG of 43, lactic acid 2.7, BNP 132.1, FOBT was positive.  Albumin 2.5.  Influenza A, B and SARS coronavirus 2 was negative. Chest x-ray showed severe emphysema with no active disease Breathing treatment was provided, Solu-Medrol was given.  Patient was started on IV ceftriaxone due to presumed urosepsis.  Patient was started on IV Protonix drip.  Hospitalist was asked to admit patient for further evaluation and  management.   Review of Systems: Constitutional: Negative for chills and fever.  HENT: Negative for ear pain and sore throat.   Eyes: Negative for pain and visual disturbance.  Respiratory: Positive for cough and shortness of breath.   Cardiovascular: Negative for chest pain and palpitations.  Gastrointestinal: Positive for black stool.  Negative for abdominal pain and vomiting.  Endocrine: Negative for polyphagia and polyuria.  Genitourinary: Positive for urinary incontinence.  Negative for decreased urine volume, dysuria musculoskeletal: Positive for back pain.  Negative for arthralgias  Skin: Negative for color change and rash.  Allergic/Immunologic: Negative for immunocompromised state.  Neurological: Negative for tremors, syncope, speech difficulty, weakness, light-headedness and headaches.  Hematological: Does not bruise/bleed easily.  All other systems reviewed and are negative   Past Medical History:  Diagnosis Date  . Abnormal stress test    s/p cardiac cath 10/2013 - nonobstructive CAD with normal LV function  . CKD (chronic kidney disease)   . COPD (chronic obstructive pulmonary disease) ([place])   . Dyspnea on exertion   . Emphysema lung ([place])   . Emphysema lung ([place])   . GERD (gastroesophageal reflux disease)   . Gout   . HTN (hypertension)   . Hyperlipidemia    Past Surgical History:  Procedure Laterality Date  . APPENDECTOMY    . HERNIA REPAIR    . LEFT HEART CATHETERIZATION WITH CORONARY ANGIOGRAM N/A 10/22/2013   Procedure: LEFT HEART CATHETERIZATION WITH CORONARY ANGIOGRAM;  Surgeon: Peter M Martinique, MD;  Location: [facility] CATH LAB;  Service: Cardiovascular;  Laterality: N/A;    Social History:  reports that he quit  smoking about 31 years ago. His smoking use included cigarettes. He has a 90.00 pack-year smoking history. He has never used smokeless tobacco. He reports that he does not drink alcohol and does not use drugs.   No Known Allergies  Family History   Problem Relation Age of Onset  . Emphysema Father   . Asthma Father      Prior to Admission medications   Medication Sig Start Date End Date Taking? Authorizing Provider  acetaminophen (TYLENOL) 650 MG CR tablet Take 650 mg by mouth every 8 (eight) hours as needed for pain.   Yes [provider]  albuterol (PROVENTIL) (2.5 MG/3ML) 0.083% nebulizer solution Take 3 mLs (2.5 mg total) by nebulization every 6 (six) hours as needed for wheezing or shortness of breath. 12/10/20  Yes Byrum, Robert S, MD  allopurinol (ZYLOPRIM) 100 MG tablet Take 100 mg by mouth daily.   Yes [provider]  aspirin 81 MG tablet Take 81 mg by mouth daily.   Yes [provider]  B Complex-Folic Acid (SUPER B COMPLEX MAXI PO) Take 1 tablet by mouth daily.    Yes [provider]  budesonide (PULMICORT) 0.5 MG/2ML nebulizer solution Take 2 mLs (0.5 mg total) by nebulization in the morning and at bedtime. 01/12/21  Yes Hunsucker, Matthew R, MD  calcium carbonate (OS-CAL) 600 MG TABS tablet Take 600 mg by mouth daily.   Yes [provider]  Cinnamon 500 MG capsule Take 1,000 mg by mouth daily.   Yes [provider]  docusate sodium (COLACE) 50 MG capsule Take 50 mg by mouth daily as needed for mild constipation.    Yes [provider]  fluorouracil (EFUDEX) 5 % cream Apply 5 % topically 2 (two) times daily as needed.    Yes [provider]  fluticasone (FLONASE) 50 MCG/ACT nasal spray Place 2 sprays into both nostrils 2 (two) times daily. 11/18/20  Yes Byrum, Robert S, MD  Ginkgo Biloba 40 MG TABS Take 40 mg by mouth daily.   Yes [provider]  ipratropium-albuterol (DUONEB) 0.5-2.5 (3) MG/3ML SOLN INHALE 1 VIAL VIA NEBULIZER ROUTE FOUR TIMES A DAY 10/21/20  Yes Byrum, Robert S, MD  irbesartan (AVAPRO) 300 MG tablet Take 1 tablet by mouth daily. 03/29/16  Yes [provider]  Misc Natural Products (OSTEO BI-FLEX ADV JOINT SHIELD PO) Take  1,500 mg by mouth daily.   Yes [provider]  Misc. Devices (ACAPELLA) MISC Use as directed 08/08/17  Yes Byrum, Robert S, MD  Multiple Vitamin (MULTIVITAMIN) capsule Take 1 capsule by mouth daily.   Yes [provider]  niacin 250 MG tablet Take 250 mg by mouth at bedtime.   Yes [provider]  predniSONE (DELTASONE) 10 MG tablet TAKE ONE TABLET BY MOUTH ONCE DAILY 12/07/20  Yes Byrum, Robert S, MD  sildenafil (REVATIO) 20 MG tablet Take 1 tablet by mouth daily. 03/29/16  Yes [provider]  tamsulosin (FLOMAX) 0.4 MG CAPS capsule Take 1 capsule by mouth daily. 03/22/15  Yes [provider]  vitamin B-12 (CYANOCOBALAMIN) 1000 MCG tablet Take 1,000 mcg by mouth daily.   Yes [provider]  zinc gluconate 50 MG tablet Take 50 mg by mouth daily.   Yes [provider]  albuterol (PROAIR HFA) 108 (90 Base) MCG/ACT inhaler Inhale 2 puffs into the lungs every 6 (six) hours as needed for wheezing or shortness of breath. 07/25/19 08/17/20  Byrum, Robert S, MD  amoxicillin-clavulanate (AUGMENTIN) 875-125   MG tablet Take 1 tablet by mouth 2 (two) times daily. Patient not taking: Reported on 01/19/2021 12/22/20   Hunsucker, Matthew R, MD    Physical Exam: BP 104/79   Pulse (!) 102   Temp 97.7 F (36.5 C) (Oral)   Resp (!) 34   SpO2 98%   . General: 85 y.o. year-old male ill-appearing and in mild acute distress.  Alert and oriented x3. . HEENT: NCAT, EOMI . Neck: Supple, trachea medial . Cardiovascular: Tachycardic.  Regular rate and rhythm with no rubs or gallops.  No thyromegaly or JVD noted. 2/4 pulses in all 4 extremities. . Respiratory: Tachypnea, bilateral decreased breath sounds.  Patient stops to catch his breath in between sentences. . Abdomen: Soft nontender nondistended with normal bowel sounds x4 quadrants. . Muskuloskeletal: Trace edema bilaterally in lower extremities.  RLE > LLE.  . Neuro: CN II-XII intact, sensation, reflexes  intact . Skin: No ulcerative lesions noted or rashes . Psychiatry: Judgement and insight appear normal. Mood is appropriate for condition and setting          Labs on Admission:  Basic Metabolic Panel: Recent Labs  Lab 02/03/2021 1625 01/24/2021 1729  NA 139 140  K 5.1 5.2*  CL 107  --   CO2 25  --   GLUCOSE 143*  --   BUN 59*  --   CREATININE 1.17  --   CALCIUM 7.7*  --    Liver Function Tests: Recent Labs  Lab 01/23/2021 1625  AST 41  ALT 37  ALKPHOS 52  BILITOT 0.6  PROT 4.8*  ALBUMIN 2.5*   No results for input(s): LIPASE, AMYLASE in the last 168 hours. No results for input(s): AMMONIA in the last 168 hours. CBC: Recent Labs  Lab 01/27/2021 1625 01/15/2021 1729  WBC 11.2*  --   NEUTROABS 8.1*  --   HGB 7.9* 7.8*  HCT 25.6* 23.0*  MCV 105.3*  --   PLT 175  --    Cardiac Enzymes: No results for input(s): CKTOTAL, CKMB, CKMBINDEX, TROPONINI in the last 168 hours.  BNP (last 3 results) Recent Labs    02/09/2021 1626  BNP 132.1*    ProBNP (last 3 results) No results for input(s): PROBNP in the last 8760 hours.  CBG: No results for input(s): GLUCAP in the last 168 hours.  Radiological Exams on Admission: DG Chest Port 1 View  Result Date: 02/12/2021 CLINICAL DATA:  Sepsis EXAM: PORTABLE CHEST 1 VIEW COMPARISON:  12/22/2020 FINDINGS: The lungs are hyperinflated and there is a paucity of vasculature within the lung apices in keeping with changes of severe emphysema, similar to that noted on prior examination. No superimposed focal pulmonary infiltrate. No pneumothorax or pleural effusion. Cardiac size within normal limits. No acute bone abnormality. IMPRESSION: No active disease.  Severe emphysema. Electronically Signed   By: Ashesh  Parikh MD   On: 01/21/2021 17:28    EKG: I independently viewed the EKG done and my findings are as followed: Sinus tachycardia at rate of 114 bpm with RBBB    Assessment/Plan Present on Admission: . Upper GI bleed . COPD  (chronic obstructive pulmonary disease) (HCC) . Hyperlipidemia . HTN (hypertension) . Hypoxemic respiratory failure, chronic (HCC) . Physical deconditioning  Principal Problem:   Upper GI bleed Active Problems:   HTN (hypertension)   Hyperlipidemia   COPD (chronic obstructive pulmonary disease) (HCC)   Hypoxemic respiratory failure, chronic (HCC)   Physical deconditioning   Sepsis secondary to UTI (HCC)   Lactic   acidosis   Acute dehydration   Hypoalbuminemia   Hyperglycemia   Leukocytosis   Macrocytic anemia   Elevated brain natriuretic peptide (BNP) level   Gout   Elevated troponin   Symptomatic anemia   Presumed sepsis secondary to UTI Patient was tachypneic and tachycardic (met SIRS criteria), he complained of sudden onset of urinary incontinence (possible source of infection)-met sepsis criteria, though urinalysis was unimpressive for UTI. He was empirically started on IV ceftriaxone, we shall continue with same at this time with plan to de-escalate/discontinue based on urine culture Urine culture and blood culture pending  Lactic acidosis possibly secondary to multifactorial including above, hypoxia, dehydration Lactic acid 2.7> 2.4, continue to trend lactic acid  Upper GI bleed Symptomatic anemia/Macrocytic Anemia H/H= 7.9/25.6, this was 15.8/46.3 (about 7 years ago; most recent lab for comparison) Hemoccult was positive Type and crossmatch was done 1 units of PRBC was started in the ED Continue IV Protonix drip Continue vitamin B12 Vitamin B12 and folate levels will be checked Gastroenterologist was consulted and we shall await further recommendation.  Chronic respiratory failure with hypoxia secondary to COPD Continue duo nebs, Mucinex, Solu-Medrol, albuterol, Pulmicort Continue Protonix to prevent steroid-induced ulcer Continue incentive spirometry and flutter valve Continue supplemental oxygen to maintain O2 sat > 92%   Elevated BNP r/o CHF BNP 132.1;  patient does not appear fluid overloaded Continue total input/output, daily weights and fluid restriction Continue Cardiac diet  Echocardiogram in the morning   Elevated troponin secondary to type II demand ischemia Troponin x2- 36 > 37; patient denies chest pain, continue to trend troponin  Acute dehydration IV hydration was provided in route to the ED by EMS team  Leukocytosis/Hyperglycemia possibly related to prednisone effect WBC 11.7, CBG 143.  Patient is chronically on prednisone at home Continue to monitor WBC and CBG with morning labs  Hypoalbuminemia possibly secondary to moderate protein calorie malnutrition Protein supplement will be provided  Hypertension Continue Avapro  Hyperlipidemia Continue Niacin  Chronic back pain Continue Tylenol as needed  BPH Continue Flomax  Gout Continue allopurinol   DVT prophylaxis: Lovenox  Code Status: Full code  Family Communication: Daughter at bedside, all questions answered to satisfaction)  Disposition Plan:  Patient is from:                        home Anticipated DC to:                   SNF or family members home Anticipated DC date:               2-3 days Anticipated DC barriers:           Patient is unstable to be discharged due to sepsis secondary to UTI and GI bleed pending gastroenterology consult and recommendation   Consults called: [facility] (Dr. Magod)  Admission status: Inpatient    Oladapo Adefeso MD [facility]  02/01/2021, 8:59 PM   

## 2021-01-30 NOTE — ED Notes (Signed)
Pt brought in by EMS for code sepsis. Reports family called 911 for urinary incontinence. Pt has not been eating at home and has been weak for a few weeks. Uses home O2 at 10LPM due to emphysema. EMS reports pt was tachy at 114 bpm at home and hypotensive. Pt is tachypneic and uses accessory muscles to breathe. Alert and oriented x 4. Continuous cardiac monitoring in place. Will continue to monitor.

## 2021-01-31 ENCOUNTER — Inpatient Hospital Stay (HOSPITAL_COMMUNITY): Payer: PPO

## 2021-01-31 ENCOUNTER — Telehealth: Payer: Self-pay | Admitting: Emergency Medicine

## 2021-01-31 ENCOUNTER — Other Ambulatory Visit: Payer: Self-pay

## 2021-01-31 ENCOUNTER — Encounter (HOSPITAL_COMMUNITY): Payer: Self-pay | Admitting: Internal Medicine

## 2021-01-31 DIAGNOSIS — I5042 Chronic combined systolic (congestive) and diastolic (congestive) heart failure: Secondary | ICD-10-CM

## 2021-01-31 DIAGNOSIS — K922 Gastrointestinal hemorrhage, unspecified: Secondary | ICD-10-CM

## 2021-01-31 LAB — CBC WITH DIFFERENTIAL/PLATELET
Abs Immature Granulocytes: 0.12 10*3/uL — ABNORMAL HIGH (ref 0.00–0.07)
Abs Immature Granulocytes: 0.22 10*3/uL — ABNORMAL HIGH (ref 0.00–0.07)
Basophils Absolute: 0 10*3/uL (ref 0.0–0.1)
Basophils Absolute: 0 10*3/uL (ref 0.0–0.1)
Basophils Relative: 0 %
Basophils Relative: 0 %
Eosinophils Absolute: 0 10*3/uL (ref 0.0–0.5)
Eosinophils Absolute: 0 10*3/uL (ref 0.0–0.5)
Eosinophils Relative: 0 %
Eosinophils Relative: 0 %
HCT: 31.1 % — ABNORMAL LOW (ref 39.0–52.0)
HCT: 30.2 % — ABNORMAL LOW (ref 39.0–52.0)
Hemoglobin: 10.4 g/dL — ABNORMAL LOW (ref 13.0–17.0)
Hemoglobin: 9.8 g/dL — ABNORMAL LOW (ref 13.0–17.0)
Immature Granulocytes: 1 %
Immature Granulocytes: 2 %
Lymphocytes Relative: 11 %
Lymphocytes Relative: 12 %
Lymphs Abs: 1.4 10*3/uL (ref 0.7–4.0)
Lymphs Abs: 1.4 10*3/uL (ref 0.7–4.0)
MCH: 33.7 pg (ref 26.0–34.0)
MCH: 32.7 pg (ref 26.0–34.0)
MCHC: 33.4 g/dL (ref 30.0–36.0)
MCHC: 32.5 g/dL (ref 30.0–36.0)
MCV: 100.6 fL — ABNORMAL HIGH (ref 80.0–100.0)
MCV: 100.7 fL — ABNORMAL HIGH (ref 80.0–100.0)
Monocytes Absolute: 0.5 10*3/uL (ref 0.1–1.0)
Monocytes Absolute: 0.7 10*3/uL (ref 0.1–1.0)
Monocytes Relative: 4 %
Monocytes Relative: 6 %
Neutro Abs: 10.2 10*3/uL — ABNORMAL HIGH (ref 1.7–7.7)
Neutro Abs: 9.8 10*3/uL — ABNORMAL HIGH (ref 1.7–7.7)
Neutrophils Relative %: 84 %
Neutrophils Relative %: 80 %
Platelets: 168 10*3/uL (ref 150–400)
Platelets: 177 10*3/uL (ref 150–400)
RBC: 3.09 MIL/uL — ABNORMAL LOW (ref 4.22–5.81)
RBC: 3 MIL/uL — ABNORMAL LOW (ref 4.22–5.81)
RDW: 15.7 % — ABNORMAL HIGH (ref 11.5–15.5)
RDW: 15.6 % — ABNORMAL HIGH (ref 11.5–15.5)
WBC: 12.2 10*3/uL — ABNORMAL HIGH (ref 4.0–10.5)
WBC: 12.1 10*3/uL — ABNORMAL HIGH (ref 4.0–10.5)
nRBC: 1.7 % — ABNORMAL HIGH (ref 0.0–0.2)
nRBC: 1.4 % — ABNORMAL HIGH (ref 0.0–0.2)

## 2021-01-31 LAB — PROTIME-INR
INR: 1.1 (ref 0.8–1.2)
Prothrombin Time: 14 seconds (ref 11.4–15.2)

## 2021-01-31 LAB — ECHOCARDIOGRAM COMPLETE
AR max vel: 1.52 cm2
AV Area VTI: 1.44 cm2
AV Area mean vel: 1.6 cm2
AV Mean grad: 5 mmHg
AV Peak grad: 12.3 mmHg
Ao pk vel: 1.75 m/s
Area-P 1/2: 2.91 cm2
Calc EF: 28.4 %
S' Lateral: 3.6 cm
Single Plane A2C EF: 30.8 %
Single Plane A4C EF: 30.6 %
Weight: 2377.44 oz

## 2021-01-31 LAB — COMPREHENSIVE METABOLIC PANEL
ALT: 46 U/L — ABNORMAL HIGH (ref 0–44)
AST: 51 U/L — ABNORMAL HIGH (ref 15–41)
Albumin: 3.1 g/dL — ABNORMAL LOW (ref 3.5–5.0)
Alkaline Phosphatase: 61 U/L (ref 38–126)
Anion gap: 6 (ref 5–15)
BUN: 53 mg/dL — ABNORMAL HIGH (ref 8–23)
CO2: 26 mmol/L (ref 22–32)
Calcium: 8.5 mg/dL — ABNORMAL LOW (ref 8.9–10.3)
Chloride: 108 mmol/L (ref 98–111)
Creatinine, Ser: 1.27 mg/dL — ABNORMAL HIGH (ref 0.61–1.24)
GFR, Estimated: 56 mL/min — ABNORMAL LOW (ref >60)
Glucose, Bld: 145 mg/dL — ABNORMAL HIGH (ref 70–99)
Potassium: 5.2 mmol/L — ABNORMAL HIGH (ref 3.5–5.1)
Sodium: 140 mmol/L (ref 135–145)
Total Bilirubin: 0.7 mg/dL (ref 0.3–1.2)
Total Protein: 5.8 g/dL — ABNORMAL LOW (ref 6.5–8.1)

## 2021-01-31 LAB — CBC
HCT: 30.3 % — ABNORMAL LOW (ref 39.0–52.0)
Hemoglobin: 10 g/dL — ABNORMAL LOW (ref 13.0–17.0)
MCH: 33.2 pg (ref 26.0–34.0)
MCHC: 33 g/dL (ref 30.0–36.0)
MCV: 100.7 fL — ABNORMAL HIGH (ref 80.0–100.0)
Platelets: 174 10*3/uL (ref 150–400)
RBC: 3.01 MIL/uL — ABNORMAL LOW (ref 4.22–5.81)
RDW: 15.2 % (ref 11.5–15.5)
WBC: 11.3 10*3/uL — ABNORMAL HIGH (ref 4.0–10.5)
nRBC: 1.3 % — ABNORMAL HIGH (ref 0.0–0.2)

## 2021-01-31 LAB — IRON AND TIBC
Iron: 86 ug/dL (ref 45–182)
Saturation Ratios: 29 % (ref 17.9–39.5)
TIBC: 293 ug/dL (ref 250–450)
UIBC: 207 ug/dL

## 2021-01-31 LAB — LACTIC ACID, PLASMA: Lactic Acid, Venous: 1.8 mmol/L (ref 0.5–1.9)

## 2021-01-31 LAB — PROCALCITONIN: Procalcitonin: 0.14 ng/mL

## 2021-01-31 LAB — VITAMIN B12: Vitamin B-12: 1748 pg/mL — ABNORMAL HIGH (ref 180–914)

## 2021-01-31 LAB — TROPONIN I (HIGH SENSITIVITY): Troponin I (High Sensitivity): 60 ng/L — ABNORMAL HIGH (ref <18)

## 2021-01-31 LAB — FERRITIN: Ferritin: 205 ng/mL (ref 24–336)

## 2021-01-31 LAB — MAGNESIUM: Magnesium: 2.2 mg/dL (ref 1.7–2.4)

## 2021-01-31 LAB — APTT: aPTT: 26 seconds (ref 24–36)

## 2021-01-31 LAB — PHOSPHORUS: Phosphorus: 4.8 mg/dL — ABNORMAL HIGH (ref 2.5–4.6)

## 2021-01-31 LAB — FOLATE: Folate: 24.4 ng/mL (ref >5.9)

## 2021-01-31 MED ORDER — ZINC GLUCONATE 50 MG PO TABS: 50.0000 mg | ORAL_TABLET | Freq: Every day | ORAL | Status: DC

## 2021-01-31 MED ORDER — DOCUSATE SODIUM 50 MG PO CAPS
50.0000 mg | ORAL_CAPSULE | Freq: Every day | ORAL | Status: DC | PRN
  Filled 2021-01-31 (×2): qty 1

## 2021-01-31 MED ORDER — IPRATROPIUM-ALBUTEROL 0.5-2.5 (3) MG/3ML IN SOLN
3.0000 mL | RESPIRATORY_TRACT | Status: DC
  Administered 2021-01-31: 3 mL via RESPIRATORY_TRACT
  Filled 2021-01-31: qty 3

## 2021-01-31 MED ORDER — PERFLUTREN LIPID MICROSPHERE
1.0000 mL | INTRAVENOUS | Status: AC | PRN
  Administered 2021-01-31: 2 mL via INTRAVENOUS
  Filled 2021-01-31: qty 10

## 2021-01-31 MED ORDER — SODIUM CHLORIDE 0.9 % IV SOLN: INTRAVENOUS | Status: DC

## 2021-01-31 MED ORDER — ALLOPURINOL 100 MG PO TABS
100.0000 mg | ORAL_TABLET | Freq: Every day | ORAL | Status: DC
  Administered 2021-01-31 – 2021-02-08 (×9): 100 mg via ORAL
  Filled 2021-01-31 (×9): qty 1

## 2021-01-31 MED ORDER — VITAMIN B-12 1000 MCG PO TABS
1000.0000 ug | ORAL_TABLET | Freq: Every day | ORAL | Status: DC
  Administered 2021-01-31 – 2021-02-08 (×9): 1000 ug via ORAL
  Filled 2021-01-31 (×9): qty 1

## 2021-01-31 MED ORDER — IRBESARTAN 300 MG PO TABS: 300.0000 mg | ORAL_TABLET | Freq: Every day | ORAL | Status: DC

## 2021-01-31 MED ORDER — NIACIN 500 MG PO TABS
250.0000 mg | ORAL_TABLET | Freq: Every day | ORAL | Status: DC
  Administered 2021-01-31 – 2021-02-07 (×8): 250 mg via ORAL
  Filled 2021-01-31 (×10): qty 1

## 2021-01-31 MED ORDER — ADULT MULTIVITAMIN W/MINERALS CH
1.0000 | ORAL_TABLET | Freq: Every day | ORAL | Status: DC
  Administered 2021-01-31 – 2021-02-08 (×9): 1 via ORAL
  Filled 2021-01-31 (×11): qty 1

## 2021-01-31 MED ORDER — ALBUTEROL SULFATE HFA 108 (90 BASE) MCG/ACT IN AERS
2.0000 | INHALATION_SPRAY | Freq: Four times a day (QID) | RESPIRATORY_TRACT | Status: DC | PRN
  Filled 2021-01-31: qty 6.7

## 2021-01-31 MED ORDER — IPRATROPIUM-ALBUTEROL 0.5-2.5 (3) MG/3ML IN SOLN: 3.0000 mL | Freq: Four times a day (QID) | RESPIRATORY_TRACT | Status: DC

## 2021-01-31 MED ORDER — ALBUTEROL SULFATE HFA 108 (90 BASE) MCG/ACT IN AERS
2.0000 | INHALATION_SPRAY | Freq: Four times a day (QID) | RESPIRATORY_TRACT | Status: DC
  Filled 2021-01-31: qty 6.7

## 2021-01-31 MED ORDER — PREDNISONE 10 MG PO TABS
10.0000 mg | ORAL_TABLET | Freq: Every day | ORAL | Status: DC
  Administered 2021-02-01 – 2021-02-08 (×8): 10 mg via ORAL
  Filled 2021-01-31 (×9): qty 1

## 2021-01-31 MED ORDER — IPRATROPIUM-ALBUTEROL 0.5-2.5 (3) MG/3ML IN SOLN
3.0000 mL | Freq: Four times a day (QID) | RESPIRATORY_TRACT | Status: DC
  Administered 2021-01-31 – 2021-02-03 (×10): 3 mL via RESPIRATORY_TRACT
  Filled 2021-01-31 (×10): qty 3

## 2021-01-31 MED ORDER — CALCIUM CARBONATE 1250 (500 CA) MG PO TABS
600.0000 mg | ORAL_TABLET | Freq: Every day | ORAL | Status: DC
  Administered 2021-01-31 – 2021-02-08 (×9): 625 mg via ORAL
  Filled 2021-01-31 (×9): qty 1

## 2021-01-31 MED ORDER — BUDESONIDE 0.5 MG/2ML IN SUSP
0.5000 mg | Freq: Two times a day (BID) | RESPIRATORY_TRACT | Status: DC
  Administered 2021-01-31 – 2021-02-08 (×16): 0.5 mg via RESPIRATORY_TRACT
  Filled 2021-01-31 (×18): qty 2

## 2021-01-31 MED ORDER — IPRATROPIUM-ALBUTEROL 0.5-2.5 (3) MG/3ML IN SOLN: 3.0000 mL | Freq: Four times a day (QID) | RESPIRATORY_TRACT | Status: DC | PRN

## 2021-01-31 MED ORDER — ASPIRIN 81 MG PO CHEW
81.0000 mg | CHEWABLE_TABLET | Freq: Every day | ORAL | Status: DC
  Administered 2021-01-31 – 2021-02-08 (×9): 81 mg via ORAL
  Filled 2021-01-31 (×9): qty 1

## 2021-01-31 MED ORDER — ACETAMINOPHEN 325 MG PO TABS
650.0000 mg | ORAL_TABLET | Freq: Three times a day (TID) | ORAL | Status: DC | PRN
  Administered 2021-02-01 – 2021-02-06 (×8): 650 mg via ORAL
  Filled 2021-01-31 (×8): qty 2

## 2021-01-31 MED ORDER — TAMSULOSIN HCL 0.4 MG PO CAPS
0.4000 mg | ORAL_CAPSULE | Freq: Every day | ORAL | Status: DC
  Administered 2021-01-31 – 2021-02-08 (×9): 0.4 mg via ORAL
  Filled 2021-01-31 (×9): qty 1

## 2021-01-31 NOTE — Progress Notes (Signed)
  Echocardiogram 2D Echocardiogram has been performed.  Roosvelt Maser F 01/31/2021, 3:12 PM

## 2021-01-31 NOTE — Consult Note (Addendum)
Cardiology Consultation:   Patient ID: Charles Archer MRN: [id number]; DOB: October 15, 1936  Admit date: 02/05/2021 Date of Consult: 01/31/2021  PCP:  Charles Floro, MD   [facility]  Cardiologist:  New   Patient Profile:   Charles Archer is a 85 y.o. male with a hx of HTN, HLD, oxygen dependent COPD (10L) and CKD who is being seen today for the evaluation of CHF at the request of Dr. Jacqulyn Archer.   Admitted 10/2013 for dyspnea on exertion. Myoview study demonstrates a fixed inferior wall defect. Echo shows an EF of 45% with global hypokinesis. Cath showed non obstructive (10-20% diffuse LAD; 20-30% RCA) CAD.   History of Present Illness:   Charles Archer treated with Abx and steroids 12/22/2020 by pulmonologist for COPD exacerbation.  And presented with multiple complaints.  Patient is recently feeling fatigue and tired with loss of appetite.  Noted dark stool as well.  He is getting more short of breath with activity, even walking to bathroom.  He had some diarrhea on Friday.  Due to ongoing lethargy EMS was called and patient found to be in hypoxic respiratory failure.  He was given 1 L of normal saline in route due to hypotension.  In the emergency department patient found tachycardic and tachypneic.  Found hemoglobin of 7.9.  Lactic acid elevated at 2.7.  BNP 132.  Stool guaiac was positive.  Patient treated for urosepsis.  He was given 1 unit of packed red blood cell.  Given abnormal echocardiogram cardiology is consulted.   Echo 01/31/2021 1. Left ventricular ejection fraction, by estimation, is 25 to 30%. The  left ventricle has severely decreased function. Wall motion very difficult  to assess due to significant ectopy and poor endocardial visualization,  however, the inferior wall appears  more hypokinetic than the other LV segments. Left ventricular diastolic  parameters are consistent with Grade I diastolic dysfunction (impaired  relaxation).  2. Right  ventricular systolic function is normal. The right ventricular  size is severely enlarged.  3. The mitral valve is grossly normal. Trivial mitral valve  regurgitation.  4. The aortic valve is tricuspid. There is moderate calcification of the  aortic valve. There is moderate thickening of the aortic valve. Aortic  valve regurgitation is mild. Mild to moderate aortic valve  sclerosis/calcification is present, without any  evidence of aortic stenosis.  5. Aortic dilatation noted. There is mild dilatation of the ascending  aorta, measuring 36 mm.   Past Medical History:  Diagnosis Date  . Abnormal stress test    s/p cardiac cath 10/2013 - nonobstructive CAD with normal LV function  . CKD (chronic kidney disease)   . COPD (chronic obstructive pulmonary disease) (HCC)   . Dyspnea on exertion   . Emphysema lung (HCC)   . Emphysema lung (HCC)   . GERD (gastroesophageal reflux disease)   . Gout   . HTN (hypertension)   . Hyperlipidemia     Past Surgical History:  Procedure Laterality Date  . APPENDECTOMY    . HERNIA REPAIR    . LEFT HEART CATHETERIZATION WITH CORONARY ANGIOGRAM N/A 10/22/2013   Procedure: LEFT HEART CATHETERIZATION WITH CORONARY ANGIOGRAM;  Surgeon: Peter M Swaziland, MD;  Location: [facility] CATH LAB;  Service: Cardiovascular;  Laterality: N/A;      Inpatient Medications: Scheduled Meds: . sodium chloride   Intravenous Once  . allopurinol  100 mg Oral Daily  . aspirin  81 mg Oral Daily  . budesonide  0.5  mg Nebulization BID  . calcium carbonate  625 mg Oral Daily  . dextromethorphan-guaiFENesin  1 tablet Oral BID  . feeding supplement (GLUCERNA SHAKE)  237 mL Oral TID BM  . ipratropium-albuterol  3 mL Nebulization Q6H  . multivitamin with minerals  1 tablet Oral Daily  . niacin  250 mg Oral QHS  . [START ON 02/03/2021] pantoprazole  40 mg Intravenous Q12H  . [START ON 02/01/2021] predniSONE  10 mg Oral Q breakfast  . tamsulosin  0.4 mg Oral Daily  . vitamin B-12  1,000  mcg Oral Daily   Continuous Infusions: . sodium chloride 75 mL/hr at 01/31/21 1301  . pantoprozole (PROTONIX) infusion 8 mg/hr (01/31/21 1249)   PRN Meds: acetaminophen, albuterol, docusate sodium, perflutren lipid microspheres (DEFINITY) IV suspension  Allergies:   No Known Allergies  Social History:   Social History   Socioeconomic History  . Marital status: Married    Spouse name: Not on file  . Number of children: 2  . Years of education: Not on file  . Highest education level: Not on file  Occupational History  . Occupation: retired Print production planner  Tobacco Use  . Smoking status: Former Smoker    Packs/day: 3.00    Years: 30.00    Pack years: 90.00    Types: Cigarettes    Quit date: 09/03/1989    Years since quitting: 31.4  . Smokeless tobacco: Never Used  Vaping Use  . Vaping Use: Never used  Substance and Sexual Activity  . Alcohol use: No  . Drug use: No  . Sexual activity: Not on file  Other Topics Concern  . Not on file  Social History Narrative  . Not on file   Social Determinants of Health   Financial Resource Strain: Not on file  Food Insecurity: Not on file  Transportation Needs: Not on file  Physical Activity: Not on file  Stress: Not on file  Social Connections: Not on file  Intimate Partner Violence: Not on file    Family History:   Family History  Problem Relation Age of Onset  . Emphysema Father   . Asthma Father      ROS:  Please see the history of present illness.  All other ROS reviewed and negative.     Physical Exam/Data:   Vitals:   01/24/2021 2304 01/31/21 0500 01/31/21 0815 01/31/21 1324  BP: (!) 158/77  (!) 164/96   Pulse: (!) 107  (!) 53   Resp: (!) 22  (!) 22   Temp: 98.7 F (37.1 C)  98.7 F (37.1 C)   TempSrc: Oral  Oral   SpO2: 94%  92% 93%  Weight:  67.4 kg      Intake/Output Summary (Last 24 hours) at 01/31/2021 1629 Last data filed at 02/10/2021 1952 Gross per 24 hour  Intake 210 ml  Output --  Net 210  ml   Last 3 Weights 01/31/2021 12/01/2020 09/30/2020  Weight (lbs) 148 lb 9.4 oz 171 lb 169 lb 9.6 oz  Weight (kg) 67.4 kg 77.565 kg 76.93 kg     Body mass index is 20.72 kg/m.  General:  Well nourished, well developed, in no acute distress HEENT: normal Lymph: no adenopathy Neck: no JVD Endocrine:  No thryomegaly Vascular: No carotid bruits; FA pulses 2+ bilaterally without bruits  Cardiac:  normal S1, S2; RRR; no murmur  Lungs: Diminished breath sounds with expiratory wheezing Abd: soft, nontender, no hepatomegaly  Ext: no edema Musculoskeletal:  No deformities, BUE  and BLE strength normal and equal Skin: warm and dry  Neuro:  CNs 2-12 intact, no focal abnormalities noted Psych:  Normal affect   EKG:  The EKG was personally reviewed and demonstrates: Sinus rhythm, PVC (EKG read as atrial fibrillation) Telemetry:  Telemetry was personally reviewed and demonstrates: Sinus tachycardia, PAC, PVC  Relevant CV Studies:  Cath 10/2013 Procedural Findings: Hemodynamics: AO 137/67 mean 101 mm Hg LV 138/12 mm Hg  Coronary angiography: Coronary dominance: right  Left mainstem: Normal  Left anterior descending (LAD): The proximal vessel is ectatic. There are diffuse irregularities less than 10-20%. The first diagonal is a large branch and is normal.  Left circumflex (LCx): The LCX gives rise to a single large OM. It is normal.  Right coronary artery (RCA): There is mild ectasia in the mid vessel followed by a segment with 20-30% disease.  Left ventriculography: Not done.  Final Conclusions:   1. Nonobstructive CAD 2. Normal LV filling pressures.  Recommendations: Based on his cath data I do not feel his dyspnea is cardiac related. May need to consider pulmonary evaluation.  Laboratory Data:  High Sensitivity Troponin:   Recent Labs  Lab 01/27/2021 1625 02/07/2021 1826 01/31/21 0334  TROPONINIHS 36* 37* 60*     Chemistry Recent Labs  Lab 01/28/2021 1625  01/29/2021 1729 01/31/21 0334  NA 139 140 140  K 5.1 5.2* 5.2*  CL 107  --  108  CO2 25  --  26  GLUCOSE 143*  --  145*  BUN 59*  --  53*  CREATININE 1.17  --  1.27*  CALCIUM 7.7*  --  8.5*  GFRNONAA >60  --  56*  ANIONGAP 7  --  6    Recent Labs  Lab 01/27/2021 1625 01/31/21 0334  PROT 4.8* 5.8*  ALBUMIN 2.5* 3.1*  AST 41 51*  ALT 37 46*  ALKPHOS 52 61  BILITOT 0.6 0.7   Hematology Recent Labs  Lab 01/17/2021 1625 01/14/2021 1729 01/31/21 0334 01/31/21 0842  WBC 11.2*  --  11.3* 12.2*  RBC 2.43*  --  3.01* 3.09*  HGB 7.9* 7.8* 10.0* 10.4*  HCT 25.6* 23.0* 30.3* 31.1*  MCV 105.3*  --  100.7* 100.6*  MCH 32.5  --  33.2 33.7  MCHC 30.9  --  33.0 33.4  RDW 14.7  --  15.2 15.7*  PLT 175  --  174 168   BNP Recent Labs  Lab 01/28/2021 1626  BNP 132.1*    DDimer No results for input(s): DDIMER in the last 168 hours.   Radiology/Studies:  DG Chest Port 1 View  Result Date: 02/05/2021 CLINICAL DATA:  Sepsis EXAM: PORTABLE CHEST 1 VIEW COMPARISON:  12/22/2020 FINDINGS: The lungs are hyperinflated and there is a paucity of vasculature within the lung apices in keeping with changes of severe emphysema, similar to that noted on prior examination. No superimposed focal pulmonary infiltrate. No pneumothorax or pleural effusion. Cardiac size within normal limits. No acute bone abnormality. IMPRESSION: No active disease.  Severe emphysema. Electronically Signed   By: Helyn Numbers MD   On: 02/04/2021 17:28   ECHOCARDIOGRAM COMPLETE  Result Date: 01/31/2021    ECHOCARDIOGRAM REPORT   Patient Name:   Charles Archer Date of Exam: 01/31/2021 Medical Rec #:  [id number]      Height:       71.0 in Accession #:    [id number]     Weight:       148.6 lb Date of Birth:  July 02, 1936      BSA:          1.858 m Patient Age:    85 years       BP:           150/73 mmHg Patient Gender: M              HR:           104 bpm. Exam Location:  Inpatient Procedure: 2D Echo, Cardiac Doppler and Color Doppler  Indications:    I50.9* Heart failure (unspecified)  History:        Patient has prior history of Echocardiogram examinations, most                 recent 10/15/2013. COPD; Risk Factors:Hypertension and                 Dyslipidemia.  Sonographer:    Roosvelt Maserachel Lane RDCS Referring Phys: [id number] OLADAPO ADEFESO IMPRESSIONS  1. Left ventricular ejection fraction, by estimation, is 25 to 30%. The left ventricle has severely decreased function. Wall motion very difficult to assess due to significant ectopy and poor endocardial visualization, however, the inferior wall appears  more hypokinetic than the other LV segments. Left ventricular diastolic parameters are consistent with Grade I diastolic dysfunction (impaired relaxation).  2. Right ventricular systolic function is normal. The right ventricular size is severely enlarged.  3. The mitral valve is grossly normal. Trivial mitral valve regurgitation.  4. The aortic valve is tricuspid. There is moderate calcification of the aortic valve. There is moderate thickening of the aortic valve. Aortic valve regurgitation is mild. Mild to moderate aortic valve sclerosis/calcification is present, without any evidence of aortic stenosis.  5. Aortic dilatation noted. There is mild dilatation of the ascending aorta, measuring 36 mm. Comparison(s): Compared to prior echo report in 2014, the LVEF is now severely reduced 25-30% (previously 45%) and the RV is severely dilated (previously reported as normal in size). FINDINGS  Left Ventricle: Wall motion very difficult to assess due to significant ectopy and poor endocardial visualization, however, the inferior wall appears more hypokinetic than the other LV segments. Left ventricular ejection fraction, by estimation, is 25 to 30%. The left ventricle has severely decreased function. Wall motion very difficult to assess due to significant ectopy and poor endocardial visualization, however, the inferior wall appears more hypokinetic than the  other LV segments.The left ventricular internal cavity size was normal in size. There is no left ventricular hypertrophy. Left ventricular diastolic parameters are consistent with Grade I diastolic dysfunction (impaired relaxation). Right Ventricle: The right ventricular size is severely enlarged. No increase in right ventricular wall thickness. Right ventricular systolic function is normal. Left Atrium: Left atrial size was not well visualized. Right Atrium: Right atrial size was normal in size. Pericardium: There is no evidence of pericardial effusion. Mitral Valve: The mitral valve is grossly normal. There is mild thickening of the mitral valve leaflet(s). There is mild calcification of the mitral valve leaflet(s). Mild to moderate mitral annular calcification. Trivial mitral valve regurgitation. Tricuspid Valve: The tricuspid valve is normal in structure. Tricuspid valve regurgitation is trivial. Aortic Valve: The aortic valve is tricuspid. There is moderate calcification of the aortic valve. There is moderate thickening of the aortic valve. Aortic valve regurgitation is mild. Mild to moderate aortic valve sclerosis/calcification is present, without any evidence of aortic stenosis. Aortic valve mean gradient measures 5.0 mmHg. Aortic valve peak gradient measures 12.2 mmHg. Aortic valve area, by VTI measures 1.44  cm. Pulmonic Valve: The pulmonic valve was normal in structure. Pulmonic valve regurgitation is trivial. Aorta: Aortic dilatation noted. There is mild dilatation of the ascending aorta, measuring 36 mm. IAS/Shunts: No atrial level shunt detected by color flow Doppler.  LEFT VENTRICLE PLAX 2D LVIDd:         4.20 cm      Diastology LVIDs:         3.60 cm      LV e' medial:    13.70 cm/s LV PW:         1.00 cm      LV E/e' medial:  4.0 LV IVS:        0.90 cm      LV e' lateral:   4.90 cm/s LVOT diam:     2.00 cm      LV E/e' lateral: 11.3 LV SV:         36 LV SV Index:   20 LVOT Area:     3.14 cm  LV  Volumes (MOD) LV vol d, MOD A2C: 137.0 ml LV vol d, MOD A4C: 111.0 ml LV vol s, MOD A2C: 94.8 ml LV vol s, MOD A4C: 77.0 ml LV SV MOD A2C:     42.2 ml LV SV MOD A4C:     111.0 ml LV SV MOD BP:      35.3 ml RIGHT VENTRICLE RV Basal diam:  4.90 cm RV Mid diam:    6.10 cm RV S prime:     12.70 cm/s TAPSE (M-mode): 2.5 cm LEFT ATRIUM             Index       RIGHT ATRIUM           Index LA diam:        3.60 cm 1.94 cm/m  RA Area:     13.50 cm LA Vol (A2C):   77.5 ml 41.70 ml/m RA Volume:   35.40 ml  19.05 ml/m LA Vol (A4C):   50.4 ml 27.12 ml/m LA Biplane Vol: 61.8 ml 33.25 ml/m  AORTIC VALVE AV Area (Vmax):    1.52 cm AV Area (Vmean):   1.60 cm AV Area (VTI):     1.44 cm AV Vmax:           175.00 cm/s AV Vmean:          106.000 cm/s AV VTI:            0.253 m AV Peak Grad:      12.2 mmHg AV Mean Grad:      5.0 mmHg LVOT Vmax:         84.90 cm/s LVOT Vmean:        54.100 cm/s LVOT VTI:          0.116 m LVOT/AV VTI ratio: 0.46  AORTA Ao Root diam: 3.50 cm Ao Asc diam:  3.60 cm MITRAL VALVE MV Area (PHT): 2.91 cm    SHUNTS MV Decel Time: 261 msec    Systemic VTI:  0.12 m MV E velocity: 55.30 cm/s  Systemic Diam: 2.00 cm MV A velocity: 87.40 cm/s MV E/A ratio:  0.63 Laurance Flatten MD Electronically signed by Laurance Flatten MD Signature Date/Time: 01/31/2021/3:52:46 PM    Final      Assessment and Plan:   1. Chronic combined CHF 2. Dyspnea Echocardiogram showed newly depressed LV function to 20 to 25% and grade 1 diastolic dysfunction.  Patient is not grossly volume overloaded on exam.  Seems his dyspnea is multifactorial from end-stage COPD, deconditioning and acute anemia.  We recommended correcting anemia first.  He is not candidate for invasive ischemic evaluation.  Likely medical management ongoing. -Currently on gentle hydration.  Use Lasix as needed.  Watch for volume overload. -His blood pressure is improving.  Will consider adding bisoprolol in the next 24 to 48 hours.  Would consider  ACE/ARB/Entresto or hydralazine.   3.  Elevated troponin -Low flat.  Not an ACS pattern.  He denies chest pain. -Likely demand in setting of acute GI bleed and sepsis appearance  4.  Upper GI bleed/anemia -S/p 1 unit of packed red blood cell -Hemoglobin has been improved -Seen by GI and plan to continue Protonix drip for at least 3 days  5.  Acute on chronic hypoxic respiratory failure secondary to COPD -Management by primary team  6. AKI - SCr 1.17>>1.27 -Patient got 1 L of normal saline by EMS and now on gentle hydration. -Follow closely  7.  Tachycardia -Review of telemetry shows sinus tachycardia at heart rate of 120 bpm with frequent ectopic PAC and PVC. -Likely driven by pulmonary disease -Will consider adding bisoprolol -No documented evidence of atrial fibrillation on monitor   Risk Assessment/Risk Scores:  {  New York Heart Association (NYHA) Functional Class NYHA Class II        For questions or updates, please contact [facility] Please consult [email or web address] for contact info under    Lorelei Pont, [place]  01/31/2021 4:29 PM   Patient seen and examined with [facility] PA.  Agree as above, with the following exceptions and changes as noted below.  Charles Archer is a pleasant 85 year old gentleman with severe COPD on 10 L of O2 who is seen with his daughter at the bedside today for evaluation of heart failure.  He feels his breathing is worse than his baseline, but also notes he is not getting his routine nebulizer treatments that he normally gets 4 x a day. Gen: NAD, CV: iRRR, no murmurs, Lungs: rhochi diffusely, Abd: soft, Extrem: Warm, well perfused, no edema, Neuro/Psych: alert and oriented x 3, normal mood and affect. All available labs, radiology testing, previous records reviewed.   Charles Archer has severe COPD severe anemia with concern for GI bleed. He has been tachycardic with PACs, which is likely driven by the lung disease and underlying medical  illness.   I discussed with the family the at this point while he is receiving fluids, diuresis unlikely to be needed.  If fluids are stopped would reassess volume status and consider Lasix.  Could consider initiating a beta-blocker such as bisoprolol in 1 to 2 days,, would not initiate just yet until anemia is better resuscitated.  His symptoms seem primary pulmonary, though there may be a component of heart failure as well.  Cardiology will follow along for assistance, however no new medication recommendations today.  This was discussed in detail with the family and all questions answered.  Parke Poisson, MD 01/31/21 6:29 PM

## 2021-01-31 NOTE — Consult Note (Signed)
Referring Provider: Dr. Tilden Fossa (ED) Primary Care Physician:  Daisy Floro, MD Primary Gastroenterologist:  [facility] (prior patient of Dr. Evette Cristal)  Reason for Consultation:  Melena, anemia  HPI: Charles Archer is a 85 y.o. male with past medical history of COPD (on 10L O2 via HFNC at home) currently admitted for presumed sepsis (?UTI) presenting for consultation of upper GI bleeding.  Patient presented to the ED yesterday with weakness, worsening shortness of breath, and urinary incontinence.    Patient reports loose black stools for a few days.  He also reports some dysphagia and decreased appetite for the past few days as well.  Denies any hematochezia, abdominal pain, nausea, vomiting.  He believes his weight has been stable.  He denies any NSAID, aspirin, or blood thinner use.  He denies any family history of colon cancer or gastrointestinal malignancy.  No prior EGD.  Last colonoscopy 09/2014 was pertinent only for diverticulosis.  Colonoscopy in 2010: one small tubular adenoma in the descending colon, one small hyperplastic polyp in the rectum, diverticulosis.  Past Medical History:  Diagnosis Date  . Abnormal stress test    s/p cardiac cath 10/2013 - nonobstructive CAD with normal LV function  . CKD (chronic kidney disease)   . COPD (chronic obstructive pulmonary disease) (HCC)   . Dyspnea on exertion   . Emphysema lung (HCC)   . Emphysema lung (HCC)   . GERD (gastroesophageal reflux disease)   . Gout   . HTN (hypertension)   . Hyperlipidemia     Past Surgical History:  Procedure Laterality Date  . APPENDECTOMY    . HERNIA REPAIR    . LEFT HEART CATHETERIZATION WITH CORONARY ANGIOGRAM N/A 10/22/2013   Procedure: LEFT HEART CATHETERIZATION WITH CORONARY ANGIOGRAM;  Surgeon: Peter M Swaziland, MD;  Location: [facility] CATH LAB;  Service: Cardiovascular;  Laterality: N/A;    Prior to Admission medications   Medication Sig Start Date End Date Taking? Authorizing Provider   acetaminophen (TYLENOL) 650 MG CR tablet Take 650 mg by mouth every 8 (eight) hours as needed for pain.   Yes [provider]  albuterol (PROVENTIL) (2.5 MG/3ML) 0.083% nebulizer solution Take 3 mLs (2.5 mg total) by nebulization every 6 (six) hours as needed for wheezing or shortness of breath. 12/10/20  Yes Byrum, Les Pou, MD  allopurinol (ZYLOPRIM) 100 MG tablet Take 100 mg by mouth daily.   Yes [provider]  aspirin 81 MG tablet Take 81 mg by mouth daily.   Yes [provider]  B Complex-Folic Acid (SUPER B COMPLEX MAXI PO) Take 1 tablet by mouth daily.    Yes [provider]  budesonide (PULMICORT) 0.5 MG/2ML nebulizer solution Take 2 mLs (0.5 mg total) by nebulization in the morning and at bedtime. 01/12/21  Yes Hunsucker, Lesia Sago, MD  calcium carbonate (OS-CAL) 600 MG TABS tablet Take 600 mg by mouth daily.   Yes [provider]  Cinnamon 500 MG capsule Take 1,000 mg by mouth daily.   Yes [provider]  docusate sodium (COLACE) 50 MG capsule Take 50 mg by mouth daily as needed for mild constipation.    Yes [provider]  fluorouracil (EFUDEX) 5 % cream Apply 5 % topically 2 (two) times daily as needed.    Yes [provider]  fluticasone (FLONASE) 50 MCG/ACT nasal spray Place 2 sprays into both nostrils 2 (two) times daily. 11/18/20  Yes Leslye Peer, MD  Ginkgo Biloba 40 MG TABS Take  40 mg by mouth daily.   Yes [provider]  ipratropium-albuterol (DUONEB) 0.5-2.5 (3) MG/3ML SOLN INHALE 1 VIAL VIA NEBULIZER ROUTE FOUR TIMES A DAY 10/21/20  Yes Byrum, Les Pou, MD  irbesartan (AVAPRO) 300 MG tablet Take 1 tablet by mouth daily. 03/29/16  Yes [provider]  Misc Natural Products (OSTEO BI-FLEX ADV JOINT SHIELD PO) Take 1,500 mg by mouth daily.   Yes [provider]  Misc. Devices (ACAPELLA) MISC Use as directed 08/08/17  Yes Byrum, Les Pou, MD  Multiple Vitamin (MULTIVITAMIN) capsule  Take 1 capsule by mouth daily.   Yes [provider]  niacin 250 MG tablet Take 250 mg by mouth at bedtime.   Yes [provider]  predniSONE (DELTASONE) 10 MG tablet TAKE ONE TABLET BY MOUTH ONCE DAILY 12/07/20  Yes Leslye Peer, MD  sildenafil (REVATIO) 20 MG tablet Take 1 tablet by mouth daily. 03/29/16  Yes [provider]  tamsulosin (FLOMAX) 0.4 MG CAPS capsule Take 1 capsule by mouth daily. 03/22/15  Yes [provider]  vitamin B-12 (CYANOCOBALAMIN) 1000 MCG tablet Take 1,000 mcg by mouth daily.   Yes [provider]  zinc gluconate 50 MG tablet Take 50 mg by mouth daily.   Yes [provider]  albuterol (PROAIR HFA) 108 (90 Base) MCG/ACT inhaler Inhale 2 puffs into the lungs every 6 (six) hours as needed for wheezing or shortness of breath. 07/25/19 08/17/20  Leslye Peer, MD    Scheduled Meds: . sodium chloride   Intravenous Once  . dextromethorphan-guaiFENesin  1 tablet Oral BID  . feeding supplement (GLUCERNA SHAKE)  237 mL Oral TID BM  . methylPREDNISolone (SOLU-MEDROL) injection  40 mg Intravenous Q12H  . [START ON 02/03/2021] pantoprazole  40 mg Intravenous Q12H   Continuous Infusions: . cefTRIAXone (ROCEPHIN)  IV    . pantoprozole (PROTONIX) infusion 8 mg/hr (01/31/21 0405)   PRN Meds:.ipratropium-albuterol  Allergies as of 2021/02/05  . (No Known Allergies)    Family History  Problem Relation Age of Onset  . Emphysema Father   . Asthma Father     Social History   Socioeconomic History  . Marital status: Married    Spouse name: Not on file  . Number of children: 2  . Years of education: Not on file  . Highest education level: Not on file  Occupational History  . Occupation: retired Print production planner  Tobacco Use  . Smoking status: Former Smoker    Packs/day: 3.00    Years: 30.00    Pack years: 90.00    Types: Cigarettes    Quit date: 09/03/1989    Years since quitting: 31.4  . Smokeless tobacco: Never  Used  Vaping Use  . Vaping Use: Never used  Substance and Sexual Activity  . Alcohol use: No  . Drug use: No  . Sexual activity: Not on file  Other Topics Concern  . Not on file  Social History Narrative  . Not on file   Social Determinants of Health   Financial Resource Strain: Not on file  Food Insecurity: Not on file  Transportation Needs: Not on file  Physical Activity: Not on file  Stress: Not on file  Social Connections: Not on file  Intimate Partner Violence: Not on file    Review of Systems: Review of Systems  Constitutional: Positive for malaise/fatigue. Negative for weight loss.  HENT: Negative for hearing loss and tinnitus.   Eyes: Negative for pain and redness.  Respiratory:  Positive for shortness of breath. Negative for cough.   Cardiovascular: Negative for chest pain and palpitations.  Gastrointestinal: Positive for diarrhea and melena. Negative for abdominal pain, blood in stool, constipation, heartburn, nausea and vomiting.  Genitourinary: Negative for flank pain and hematuria.  Musculoskeletal: Negative for back pain and neck pain.  Skin: Negative for itching and rash.  Neurological: Negative for seizures and loss of consciousness.  Endo/Heme/Allergies: Negative for polydipsia. Does not bruise/bleed easily.  Psychiatric/Behavioral: Negative for substance abuse. The patient is not nervous/anxious.      Physical Exam: Vital signs: Vitals:   01/18/2021 2304 01/31/21 0815  BP: (!) 158/77 (!) 164/96  Pulse: (!) 107 (!) 53  Resp: (!) 22 (!) 22  Temp: 98.7 F (37.1 C) 98.7 F (37.1 C)  SpO2: 94% 92%      Physical Exam Vitals reviewed.  Constitutional:      General: He is not in acute distress.    Appearance: He is underweight. He is ill-appearing.     Interventions: Nasal cannula in place.  HENT:     Head: Normocephalic and atraumatic.     Nose: Nose normal. No congestion.     Mouth/Throat:     Mouth: Mucous membranes are moist.     Pharynx:  Oropharynx is clear.  Eyes:     General: No scleral icterus.    Extraocular Movements: Extraocular movements intact.  Cardiovascular:     Rate and Rhythm: Regular rhythm. Tachycardia present.  Pulmonary:     Effort: Tachypnea present. No respiratory distress.  Abdominal:     General: Bowel sounds are normal. There is no distension.     Palpations: Abdomen is soft. There is no mass.     Tenderness: There is no abdominal tenderness. There is no guarding or rebound.     Hernia: No hernia is present.  Musculoskeletal:        General: No swelling or tenderness.     Cervical back: Normal range of motion and neck supple.  Skin:    General: Skin is warm and dry.  Neurological:     General: No focal deficit present.     Mental Status: He is oriented to person, place, and time. He is lethargic.  Psychiatric:        Mood and Affect: Mood normal.        Behavior: Behavior normal. Behavior is cooperative.     GI:  Lab Results: Recent Labs    01/16/2021 1625 01/26/2021 1729 01/31/21 0334  WBC 11.2*  --  11.3*  HGB 7.9* 7.8* 10.0*  HCT 25.6* 23.0* 30.3*  PLT 175  --  174   BMET Recent Labs    01/31/2021 1625 01/25/2021 1729 01/31/21 0334  NA 139 140 140  K 5.1 5.2* 5.2*  CL 107  --  108  CO2 25  --  26  GLUCOSE 143*  --  145*  BUN 59*  --  53*  CREATININE 1.17  --  1.27*  CALCIUM 7.7*  --  8.5*   LFT Recent Labs    01/31/21 0334  PROT 5.8*  ALBUMIN 3.1*  AST 51*  ALT 46*  ALKPHOS 61  BILITOT 0.7   PT/INR Recent Labs    02/07/2021 1625 01/31/21 0334  LABPROT 15.5* 14.0  INR 1.2 1.1     Studies/Results: DG Chest Port 1 View  Result Date: 02/02/2021 CLINICAL DATA:  Sepsis EXAM: PORTABLE CHEST 1 VIEW COMPARISON:  12/22/2020 FINDINGS: The lungs are hyperinflated and there is  a paucity of vasculature within the lung apices in keeping with changes of severe emphysema, similar to that noted on prior examination. No superimposed focal pulmonary infiltrate. No pneumothorax  or pleural effusion. Cardiac size within normal limits. No acute bone abnormality. IMPRESSION: No active disease.  Severe emphysema. Electronically Signed   By: Helyn Numbers MD   On: 01/20/2021 17:28    Impression: Suspected upper GI bleeding: He reports melena for few days, as well as a few days of dysphagia.  Esophagitis versus gastritis versus PUD.  Lesion/malignancy also in the differential. -Hemoglobin 7.9 on arrival yesterday, now 10.0 after 1u pRBCs (unknown baseline, as last CBC was in 10/2013 with hemoglobin 15.8).   -BUN elevated to 59 on arrival, as compared to baseline 25, suggestive of upper GI bleeding -Normal PT/INR 14/1.0 -Macrocytosis present  Chronic respiratory failure with hypoxia secondary to COPD, on 10L  Presumed sepsis  Lactic acidosis  Plan: Would favor conservative management, given increased work of breathing and baseline severe COPD.  Continue IV Protonix drip for 72h, followed by BID dosing.  Clear liquid diet OK from a GI standpoint.  If continued bleeding, consider EGD, if OK from anesthesia standpoint (may need anesthesia consult).    If no further bleeding, consider upper GI series to rule out significant lesion.  Continue supportive care.  Continue to monitor H&H with transfusion as needed to maintain Hgb >7.  [facility] will follow.    LOS: 1 day   Edrick Kins  PA-C 01/31/2021, 8:36 AM  Contact #  [phone]

## 2021-01-31 NOTE — Progress Notes (Addendum)
PROGRESS NOTE    Charles Archer  ALP:[id number] DOB: 08/06/1936 DOA: 02/17/2021 PCP: Daisy Floro, MD   Brief Narrative:  HPI: Charles Archer is a 85 y.o. male with medical history significant for COPD on 10 L of oxygen via HFNC, BPH, hypertension, hyperlipidemia, gout who presents to the emergency department due to suspicion for sepsis.  Patient complained of increased work of breathing on exertion since Friday (4/15), he endorsed urinary incontinence while on his wheelchair on Thursday (4/14), patient states that apart from the urinary incontinence, it was also difficult for him to make it to the bathroom due to increased shortness of breath and weakness, he also noted that his stool was black.  Appetite has decreased since onset of symptoms on Friday.  Family activated EMS today due to patient being lethargic and suspicion for sepsis, on arrival of EMS team, patient was reported to be in respiratory distress with wheezes and crackles, which resolved with lying supine.  He was also reported to be hypotensive, so 1 L of NS was given in route.  Patient endorsed increased cough with occasional clear phlegm and chronic mid back pain due to T3-T4  compression fracture, but denies fever, chills, chest pain, abdominal pain.  ED Course:  In the emergency department, he was tachycardic and tachypneic.  Work-up in the ED showed leukocytosis, macrocytic anemia, BUN was elevated at 59, CBG of 43, lactic acid 2.7, BNP 132.1, FOBT was positive.  Albumin 2.5.  Influenza A, B and SARS coronavirus 2 was negative. Chest x-ray showed severe emphysema with no active disease Breathing treatment was provided, Solu-Medrol was given.  Patient was started on IV ceftriaxone due to presumed urosepsis.  Patient was started on IV Protonix drip.  Hospitalist was asked to admit patient for further evaluation and management.   Assessment & Plan:   Principal Problem:   Upper GI bleed Active Problems:   HTN  (hypertension)   Hyperlipidemia   COPD (chronic obstructive pulmonary disease) (HCC)   Hypoxemic respiratory failure, chronic (HCC)   Physical deconditioning   Sepsis secondary to UTI (HCC)   Lactic acidosis   Acute dehydration   Hypoalbuminemia   Hyperglycemia   Leukocytosis   Macrocytic anemia   Elevated brain natriuretic peptide (BNP) level   Gout   Elevated troponin   Symptomatic anemia  Sepsis ruled out: Patient was admitted as presumed sepsis based off of tachycardia and tachypnea and possible UTI however patient's UA is completely clean with no signs of infection.  I do not think he has UTI.  His tachycardia and tachypnea was likely secondary to GI bleed and his COPD.  Discontinue antibiotics.  Lactic acidosis possibly secondary to multifactorial including above, hypoxia, dehydration.  Resolved  Upper GI bleed/melena Symptomatic anemia/Macrocytic Anemia H/H= 7.9/25.6, this was 15.8/46.3 (about 7 years ago; most recent lab for comparison) Hemoccult was positive.  He received 1 unit of PRBC transfusion.  Hemoglobin improved to 10.  Seen by GI.  Due to his severe COPD, they recommend conservative management and recommend continuing Protonix drip for at least 3 days and then oral PPI twice daily unless he has severe bleeding.  They could consider doing EGD but he would need anesthesia consult.  Monitor H&H every 12 hours for next 1 to 2 days.  Chronic respiratory failure with hypoxia secondary to COPD: Reportedly, patient uses 10 L of high flow oxygen at home.  Currently he is at his baseline. Continue duo nebs, Mucinex, Solu-Medrol, albuterol, Pulmicort Continue incentive  spirometry and flutter valve Continue supplemental oxygen to maintain O2 sat > 92%   Elevated BNP r/o CHF BNP 132.1; patient does not appear fluid overloaded Continue total input/output, daily weights and fluid restriction: Echo pending.  Hyperkalemia: 5.2.  Hopefully this will correct by itself.  Repeat  labs in the morning  Mild AKI: Creatinine jumped to 1.27.  Will start gentle hydration with normal saline at 75 cc/h.  Elevated troponin secondary to type II demand ischemia Troponin x2- 36 > 37; patient denies chest pain.  Dehydration: IV hydration was provided in route to the ED by EMS team.  Resume IV fluids for another day.  Leukocytosis/Hyperglycemia possibly related to prednisone effect WBC 11.7, CBG 143.  Patient is chronically on prednisone at home Continue to monitor WBC and CBG with morning labs  Hypoalbuminemia possibly secondary to moderate protein calorie malnutrition.  Consult dietitian.  Hypertension Continue Avapro  Hyperlipidemia Continue Niacin  Chronic back pain Continue Tylenol as needed  BPH Continue Flomax  Gout Continue allopurinol  DVT prophylaxis: SCDs Start: 01/28/2021 2010   Code Status: Full Code  Family Communication: None present at bedside.  Plan of care discussed with patient in length and he verbalized understanding and agreed with it.  Status is: Inpatient  Remains inpatient appropriate because:Inpatient level of care appropriate due to severity of illness   Dispo: The patient is from: Home              Anticipated d/c is to: Home              Patient currently is not medically stable to d/c.   Difficult to place patient No        Estimated body mass index is 20.72 kg/m as calculated from the following:   Height as of 12/22/20: 5\' 11"  (1.803 m).   Weight as of this encounter: 67.4 kg.      Nutritional status:               Consultants:   GI  Procedures:   None  Antimicrobials:  Anti-infectives (From admission, onward)   Start     Dose/Rate Route Frequency Ordered Stop   01/31/21 0800  cefTRIAXone (ROCEPHIN) 1 g in sodium chloride 0.9 % 100 mL IVPB        1 g 200 mL/hr over 30 Minutes Intravenous Every 24 hours 01/23/2021 2049     01/23/2021 1645  cefTRIAXone (ROCEPHIN) 1 g in sodium chloride 0.9 %  100 mL IVPB        1 g 200 mL/hr over 30 Minutes Intravenous  Once 01/25/2021 1644 01/29/2021 1745         Subjective: Patient seen and examined.  He was upset due to being n.p.o. but otherwise did not have any complaint.  Denied any abdominal pain, nausea, chest pain or shortness of breath.  Tells me that his breathing is at his baseline.  Objective: Vitals:   01/22/2021 2200 02/11/2021 2304 01/31/21 0500 01/31/21 0815  BP: 126/69 (!) 158/77  (!) 164/96  Pulse: (!) 113 (!) 107  (!) 53  Resp: (!) 34 (!) 22  (!) 22  Temp: 98.7 F (37.1 C) 98.7 F (37.1 C)  98.7 F (37.1 C)  TempSrc: Oral Oral  Oral  SpO2:  94%  92%  Weight:   67.4 kg     Intake/Output Summary (Last 24 hours) at 01/31/2021 1021 Last data filed at 02/12/2021 1952 Gross per 24 hour  Intake 210 ml  Output --  Net 210 ml   Filed Weights   01/31/21 0500  Weight: 67.4 kg    Examination:  General exam: Appears calm and comfortable  Respiratory system: Rhonchi bilaterally. Respiratory effort normal. Cardiovascular system: S1 & S2 heard, RRR. No JVD, murmurs, rubs, gallops or clicks. No pedal edema. Gastrointestinal system: Abdomen is nondistended, soft and nontender. No organomegaly or masses felt. Normal bowel sounds heard. Central nervous system: Alert and oriented. No focal neurological deficits. Extremities: Symmetric 5 x 5 power. Skin: No rashes, lesions or ulcers  Data Reviewed: I have personally reviewed following labs and imaging studies  CBC: Recent Labs  Lab 01/16/2021 1625 01/17/2021 1729 01/31/21 0334  WBC 11.2*  --  11.3*  NEUTROABS 8.1*  --   --   HGB 7.9* 7.8* 10.0*  HCT 25.6* 23.0* 30.3*  MCV 105.3*  --  100.7*  PLT 175  --  174   Basic Metabolic Panel: Recent Labs  Lab 01/19/2021 1625 01/28/2021 1729 01/31/21 0334  NA 139 140 140  K 5.1 5.2* 5.2*  CL 107  --  108  CO2 25  --  26  GLUCOSE 143*  --  145*  BUN 59*  --  53*  CREATININE 1.17  --  1.27*  CALCIUM 7.7*  --  8.5*  MG  --   --   2.2  PHOS  --   --  4.8*   GFR: Estimated Creatinine Clearance: 42 mL/min (A) (by C-G formula based on SCr of 1.27 mg/dL (H)). Liver Function Tests: Recent Labs  Lab 02/11/2021 1625 01/31/21 0334  AST 41 51*  ALT 37 46*  ALKPHOS 52 61  BILITOT 0.6 0.7  PROT 4.8* 5.8*  ALBUMIN 2.5* 3.1*   No results for input(s): LIPASE, AMYLASE in the last 168 hours. No results for input(s): AMMONIA in the last 168 hours. Coagulation Profile: Recent Labs  Lab 02/09/2021 1625 01/31/21 0334  INR 1.2 1.1   Cardiac Enzymes: No results for input(s): CKTOTAL, CKMB, CKMBINDEX, TROPONINI in the last 168 hours. BNP (last 3 results) No results for input(s): PROBNP in the last 8760 hours. HbA1C: No results for input(s): HGBA1C in the last 72 hours. CBG: No results for input(s): GLUCAP in the last 168 hours. Lipid Profile: No results for input(s): CHOL, HDL, LDLCALC, TRIG, CHOLHDL, LDLDIRECT in the last 72 hours. Thyroid Function Tests: No results for input(s): TSH, T4TOTAL, FREET4, T3FREE, THYROIDAB in the last 72 hours. Anemia Panel: Recent Labs    01/31/21 0334  VITAMINB12 1,748*  FOLATE 24.4   Sepsis Labs: Recent Labs  Lab 01/27/2021 1625 02/04/2021 1825 01/31/21 0334 01/31/21 0842  PROCALCITON  --   --   --  0.14  LATICACIDVEN 2.7* 2.4* 1.8  --     Recent Results (from the past 240 hour(s))  Resp Panel by RT-PCR (Flu A&B, Covid) Nasopharyngeal Swab     Status: None   Collection Time: 02/10/2021  4:25 PM   Specimen: Nasopharyngeal Swab; Nasopharyngeal(NP) swabs in vial transport medium  Result Value Ref Range Status   SARS Coronavirus 2 by RT PCR NEGATIVE NEGATIVE Final    Comment: (NOTE) SARS-CoV-2 target nucleic acids are NOT DETECTED.  The SARS-CoV-2 RNA is generally detectable in upper respiratory specimens during the acute phase of infection. The lowest concentration of SARS-CoV-2 viral copies this assay can detect is 138 copies/mL. A negative result does not preclude  SARS-Cov-2 infection and should not be used as the sole basis for treatment or other patient management decisions. A  negative result may occur with  improper specimen collection/handling, submission of specimen other than nasopharyngeal swab, presence of viral mutation(s) within the areas targeted by this assay, and inadequate number of viral copies(<138 copies/mL). A negative result must be combined with clinical observations, patient history, and epidemiological information. The expected result is Negative.  Fact Sheet for Patients:  BloggerCourse.com  Fact Sheet for Healthcare Providers:  SeriousBroker.it  This test is no t yet approved or cleared by the [place] FDA and  has been authorized for detection and/or diagnosis of SARS-CoV-2 by FDA under an Emergency Use Authorization (EUA). This EUA will remain  in effect (meaning this test can be used) for the duration of the COVID-19 declaration under Section 564(b)(1) of the Act, 21 U.S.C.section 360bbb-3(b)(1), unless the authorization is terminated  or revoked sooner.       Influenza A by PCR NEGATIVE NEGATIVE Final   Influenza B by PCR NEGATIVE NEGATIVE Final    Comment: (NOTE) The Xpert Xpress SARS-CoV-2/FLU/RSV plus assay is intended as an aid in the diagnosis of influenza from Nasopharyngeal swab specimens and should not be used as a sole basis for treatment. Nasal washings and aspirates are unacceptable for Xpert Xpress SARS-CoV-2/FLU/RSV testing.  Fact Sheet for Patients: BloggerCourse.com  Fact Sheet for Healthcare Providers: SeriousBroker.it  This test is not yet approved or cleared by the [place] FDA and has been authorized for detection and/or diagnosis of SARS-CoV-2 by FDA under an Emergency Use Authorization (EUA). This EUA will remain in effect (meaning this test can be used) for the duration of  the COVID-19 declaration under Section 564(b)(1) of the Act, 21 U.S.C. section 360bbb-3(b)(1), unless the authorization is terminated or revoked.  Performed at [facility], [address], [place], [place] [address]   Blood Culture (routine x 2)     Status: None (Preliminary result)   Collection Time: February 03, 2021  4:25 PM   Specimen: BLOOD  Result Value Ref Range Status   Specimen Description BLOOD LEFT ANTECUBITAL  Final   Special Requests   Final    BOTTLES DRAWN AEROBIC AND ANAEROBIC Blood Culture results may not be optimal due to an inadequate volume of blood received in culture bottles   Culture   Final    NO GROWTH < 24 HOURS Performed at [facility], [address], [place], [place] [address]    Report Status PENDING  Incomplete  Blood Culture (routine x 2)     Status: None (Preliminary result)   Collection Time: Feb 03, 2021  4:30 PM   Specimen: BLOOD  Result Value Ref Range Status   Specimen Description BLOOD RIGHT ANTECUBITAL  Final   Special Requests   Final    BOTTLES DRAWN AEROBIC AND ANAEROBIC Blood Culture results may not be optimal due to an inadequate volume of blood received in culture bottles   Culture   Final    NO GROWTH < 24 HOURS Performed at [facility], [address], [place], [place] [address]    Report Status PENDING  Incomplete      Radiology Studies: DG Chest Port 1 View  Result Date: 02-03-2021 CLINICAL DATA:  Sepsis EXAM: PORTABLE CHEST 1 VIEW COMPARISON:  12/22/2020 FINDINGS: The lungs are hyperinflated and there is a paucity of vasculature within the lung apices in keeping with changes of severe emphysema, similar to that noted on prior examination. No superimposed focal pulmonary infiltrate. No pneumothorax or pleural effusion. Cardiac size within normal limits. No acute bone  abnormality. IMPRESSION: No active disease.  Severe emphysema. Electronically Signed   By: Helyn Numbers MD   On: 2021/03/01 17:28    Scheduled Meds: .  sodium chloride   Intravenous Once  . dextromethorphan-guaiFENesin  1 tablet Oral BID  . feeding supplement (GLUCERNA SHAKE)  237 mL Oral TID BM  . ipratropium-albuterol  3 mL Nebulization Q4H  . methylPREDNISolone (SOLU-MEDROL) injection  40 mg Intravenous Q12H  . [START ON 02/03/2021] pantoprazole  40 mg Intravenous Q12H   Continuous Infusions: . cefTRIAXone (ROCEPHIN)  IV 1 g (01/31/21 1017)  . pantoprozole (PROTONIX) infusion 8 mg/hr (01/31/21 0405)     LOS: 1 day   Time spent: 35 minutes   Hughie Closs, MD [facility]  01/31/2021, 10:21 AM   To contact the attending provider between 7A-7P or the covering provider during after hours 7P-7A, please log into the web site [email or web address].  Addendum: Received a message from Dr. Shari Prows of cardiology about patient's low ejection fraction of 30 to 35% which is new.  Has wall motion abnormality.  This raises concern for possible ischemic cardiomyopathy.  I have consulted cardiology to help with managing this patient.  I also called patient's daughter [name] and left a voicemail.

## 2021-01-31 NOTE — Telephone Encounter (Signed)
Called and spoke with the patient's daughter, [name], not on the DPR, did not provide any patient information, just listened to daughter's concerns regarding his breathing during his hospitalization.  The patients wife is listed on the DPR, [name], however she is elderly and gets confused per the daughter.  [name] states she is the one that brings her father to the doctor and she was under the impression that the DPR had been updated.  I advised it had not been updated since 2015 and it would need to be updated during his next office visit so that we would be able to legally speak with her.  She verbalized understanding.  She said her dad was admitted with a bleeding ulcer and was breathing hard, she was advised that he was going to received a blood transfusion, however, after the transfusion, his breathing is no better.  He is not receiving his nebulizer treatments 4 times per day as he takes them at home and she is very worried about his breathing.  She asked that Dr. Delton Coombes see him while he was in the hospital, however, she was told that he had not been admitted with a breathing problem.  Advised I would make the hospital physicians aware of her concerns, however, the admitting doctor needs to consult them.  FYI message sent to hospital pulmonary physicians.  She verbalized understanding.  Noting further needed.

## 2021-02-01 ENCOUNTER — Inpatient Hospital Stay (HOSPITAL_COMMUNITY): Payer: PPO

## 2021-02-01 DIAGNOSIS — K922 Gastrointestinal hemorrhage, unspecified: Secondary | ICD-10-CM | POA: Diagnosis not present

## 2021-02-01 LAB — CBC WITH DIFFERENTIAL/PLATELET
Abs Immature Granulocytes: 0.17 10*3/uL — ABNORMAL HIGH (ref 0.00–0.07)
Abs Immature Granulocytes: 0.15 10*3/uL — ABNORMAL HIGH (ref 0.00–0.07)
Basophils Absolute: 0 10*3/uL (ref 0.0–0.1)
Basophils Absolute: 0 10*3/uL (ref 0.0–0.1)
Basophils Relative: 0 %
Basophils Relative: 0 %
Eosinophils Absolute: 0 10*3/uL (ref 0.0–0.5)
Eosinophils Absolute: 0 10*3/uL (ref 0.0–0.5)
Eosinophils Relative: 0 %
Eosinophils Relative: 0 %
HCT: 26 % — ABNORMAL LOW (ref 39.0–52.0)
HCT: 27.7 % — ABNORMAL LOW (ref 39.0–52.0)
Hemoglobin: 8.5 g/dL — ABNORMAL LOW (ref 13.0–17.0)
Hemoglobin: 8.9 g/dL — ABNORMAL LOW (ref 13.0–17.0)
Immature Granulocytes: 1 %
Immature Granulocytes: 1 %
Lymphocytes Relative: 9 %
Lymphocytes Relative: 9 %
Lymphs Abs: 1.3 10*3/uL (ref 0.7–4.0)
Lymphs Abs: 1.3 10*3/uL (ref 0.7–4.0)
MCH: 33.1 pg (ref 26.0–34.0)
MCH: 33.1 pg (ref 26.0–34.0)
MCHC: 32.7 g/dL (ref 30.0–36.0)
MCHC: 32.1 g/dL (ref 30.0–36.0)
MCV: 101.2 fL — ABNORMAL HIGH (ref 80.0–100.0)
MCV: 103 fL — ABNORMAL HIGH (ref 80.0–100.0)
Monocytes Absolute: 1.2 10*3/uL — ABNORMAL HIGH (ref 0.1–1.0)
Monocytes Absolute: 0.9 10*3/uL (ref 0.1–1.0)
Monocytes Relative: 8 %
Monocytes Relative: 6 %
Neutro Abs: 12.3 10*3/uL — ABNORMAL HIGH (ref 1.7–7.7)
Neutro Abs: 12.3 10*3/uL — ABNORMAL HIGH (ref 1.7–7.7)
Neutrophils Relative %: 82 %
Neutrophils Relative %: 84 %
Platelets: 158 10*3/uL (ref 150–400)
Platelets: 183 10*3/uL (ref 150–400)
RBC: 2.57 MIL/uL — ABNORMAL LOW (ref 4.22–5.81)
RBC: 2.69 MIL/uL — ABNORMAL LOW (ref 4.22–5.81)
RDW: 15.7 % — ABNORMAL HIGH (ref 11.5–15.5)
RDW: 16 % — ABNORMAL HIGH (ref 11.5–15.5)
WBC: 14.9 10*3/uL — ABNORMAL HIGH (ref 4.0–10.5)
WBC: 14.6 10*3/uL — ABNORMAL HIGH (ref 4.0–10.5)
nRBC: 1 % — ABNORMAL HIGH (ref 0.0–0.2)
nRBC: 0.9 % — ABNORMAL HIGH (ref 0.0–0.2)

## 2021-02-01 LAB — BASIC METABOLIC PANEL
Anion gap: 6 (ref 5–15)
BUN: 40 mg/dL — ABNORMAL HIGH (ref 8–23)
CO2: 26 mmol/L (ref 22–32)
Calcium: 8.1 mg/dL — ABNORMAL LOW (ref 8.9–10.3)
Chloride: 109 mmol/L (ref 98–111)
Creatinine, Ser: 1.07 mg/dL (ref 0.61–1.24)
GFR, Estimated: >60 mL/min (ref >60)
Glucose, Bld: 117 mg/dL — ABNORMAL HIGH (ref 70–99)
Potassium: 5.3 mmol/L — ABNORMAL HIGH (ref 3.5–5.1)
Sodium: 141 mmol/L (ref 135–145)

## 2021-02-01 LAB — BRAIN NATRIURETIC PEPTIDE: B Natriuretic Peptide: 333 pg/mL — ABNORMAL HIGH (ref 0.0–100.0)

## 2021-02-01 LAB — MAGNESIUM: Magnesium: 2.1 mg/dL (ref 1.7–2.4)

## 2021-02-01 MED ORDER — FUROSEMIDE 10 MG/ML IJ SOLN
40.0000 mg | Freq: Once | INTRAMUSCULAR | Status: AC
  Administered 2021-02-01: 40 mg via INTRAVENOUS
  Filled 2021-02-01: qty 4

## 2021-02-01 MED ORDER — SODIUM POLYSTYRENE SULFONATE 15 GM/60ML PO SUSP
30.0000 g | Freq: Once | ORAL | Status: AC
  Administered 2021-02-01: 30 g via ORAL
  Filled 2021-02-01: qty 120

## 2021-02-01 NOTE — Progress Notes (Signed)
PROGRESS NOTE    KEISHAWN Archer  HGD:[id number] DOB: 04-22-1936 DOA: 2021-02-11 PCP: Daisy Floro, MD   Brief Narrative:  HPI: Charles Archer is a 85 y.o. male with medical history significant for COPD on 10 L of oxygen via HFNC, BPH, hypertension, hyperlipidemia, gout who presents to the emergency department due to suspicion for sepsis.  Patient complained of increased work of breathing on exertion since Friday (4/15), he endorsed urinary incontinence while on his wheelchair on Thursday (4/14), patient states that apart from the urinary incontinence, it was also difficult for him to make it to the bathroom due to increased shortness of breath and weakness, he also noted that his stool was black.  Appetite has decreased since onset of symptoms on Friday.  Family activated EMS today due to patient being lethargic and suspicion for sepsis, on arrival of EMS team, patient was reported to be in respiratory distress with wheezes and crackles, which resolved with lying supine.  He was also reported to be hypotensive, so 1 L of NS was given in route.  Patient endorsed increased cough with occasional clear phlegm and chronic mid back pain due to T3-T4  compression fracture, but denies fever, chills, chest pain, abdominal pain.  ED Course:  In the emergency department, he was tachycardic and tachypneic.  Work-up in the ED showed leukocytosis, macrocytic anemia, BUN was elevated at 59, CBG of 43, lactic acid 2.7, BNP 132.1, FOBT was positive.  Albumin 2.5.  Influenza A, B and SARS coronavirus 2 was negative. Chest x-ray showed severe emphysema with no active disease Breathing treatment was provided, Solu-Medrol was given.  Patient was started on IV ceftriaxone due to presumed urosepsis.  Patient was started on IV Protonix drip.  Hospitalist was asked to admit patient for further evaluation and management.   Assessment & Plan:   Principal Problem:   Upper GI bleed Active Problems:   HTN  (hypertension)   Hyperlipidemia   COPD (chronic obstructive pulmonary disease) (HCC)   Hypoxemic respiratory failure, chronic (HCC)   Physical deconditioning   Sepsis secondary to UTI (HCC)   Lactic acidosis   Acute dehydration   Hypoalbuminemia   Hyperglycemia   Leukocytosis   Macrocytic anemia   Elevated brain natriuretic peptide (BNP) level   Gout   Elevated troponin   Symptomatic anemia  Sepsis ruled out: Patient was admitted as presumed sepsis based off of tachycardia and tachypnea and possible UTI however patient's UA is completely clean with no signs of infection.  I do not think he has UTI.  His tachycardia and tachypnea was likely secondary to GI bleed and his COPD.  Antibiotics discontinued.  Lactic acidosis possibly secondary to multifactorial including above, hypoxia, dehydration.  Resolved  Upper GI bleed/melena Symptomatic anemia/Macrocytic Anemia H/H= 7.9/25.6, this was 15.8/46.3 (about 7 years ago; most recent lab for comparison) Hemoccult was positive.  He received 1 unit of PRBC transfusion.  Hemoglobin improved to 10.  Seen by GI.  Due to his severe COPD, they recommend conservative management and recommend continuing Protonix drip for at least 3 days and then oral PPI twice daily unless he has severe bleeding.  They could consider doing EGD but he would need anesthesia consult.  Monitor H&H every 12 hours for next 1 to 2 days.  Hemoglobin dropped to 8.5.  No reports of stool.  Monitor daily.  Chronic respiratory failure with hypoxia secondary to COPD: Reportedly, patient uses 10 L of high flow oxygen at home.  Currently he  is at his baseline oxygen requirement however today he feels more short of breath.  BNP checked that is elevated.  Wonder if he has some component of acute on chronic combined congestive heart failure.  We will give him 1 dose of Lasix IV 40 mg. Continue duo nebs, Mucinex, Solu-Medrol, albuterol, Pulmicort Continue incentive spirometry and flutter  valve Continue supplemental oxygen to maintain O2 sat > 92%   Acute on chronic combined systolic and diastolic congestive heart failure: Echo on 02/01/2019 shows reduced ejection fraction of 25 to 30% compared to 45% in 2014 as well as grade 1 diastolic dysfunction.  Has slightly elevated cardiac enzymes but flat. BNP 132 and now 133 and he feels short of breath and does have crackles on examination as well.  Cardiology on board and they recommend pursuing conservative management due to his end-stage COPD.  Hyperkalemia: 5.3 today.  Lasix is being given.  Hopefully that will corrected.  Mild AKI: Resolved.  Stop IV fluids.  Dehydration: Resolved.  Now seems to be slightly fluid overloaded.  Leukocytosis/Hyperglycemia possibly related to prednisone effect WBC 11.7, CBG 143.  Patient is chronically on prednisone at home Continue to monitor WBC and CBG with morning labs  Hypoalbuminemia possibly secondary to moderate protein calorie malnutrition.  Consult dietitian.  Hypertension: Blood pressure stable.  Avapro on hold due to recent AKI.  Continue to hold.  Hyperlipidemia Continue Niacin  Chronic back pain Continue Tylenol as needed  BPH Continue Flomax  Gout: Continue allopurinol  DVT prophylaxis: SCDs Start: 02/24/2021 2010   Code Status: Full Code  Family Communication: None present at bedside.  I was able to discuss plan of care with the daughter yesterday.  Called again today and left a voicemail.  Status is: Inpatient  Remains inpatient appropriate because:Inpatient level of care appropriate due to severity of illness   Dispo: The patient is from: Home              Anticipated d/c is to: Home              Patient currently is not medically stable to d/c.   Difficult to place patient No        Estimated body mass index is 21.31 kg/m as calculated from the following:   Height as of 12/22/20:  (1.803 m).   Weight as of this encounter: 69.3 kg.       Nutritional status:               Consultants:   GI  Procedures:   None  Antimicrobials:  Anti-infectives (From admission, onward)   Start     Dose/Rate Route Frequency Ordered Stop   01/31/21 0800  cefTRIAXone (ROCEPHIN) 1 g in sodium chloride 0.9 % 100 mL IVPB  Status:  Discontinued        1 g 200 mL/hr over 30 Minutes Intravenous Every 24 hours 24-Feb-2021 2049 01/31/21 1023   Feb 24, 2021 1645  cefTRIAXone (ROCEPHIN) 1 g in sodium chloride 0.9 % 100 mL IVPB        1 g 200 mL/hr over 30 Minutes Intravenous  Once 2021/02/24 1644 2021/02/24 1745         Subjective: Seen and examined.  Although looks comfortable but complains of more shortness of breath compared to yesterday.  No new complaint.  Objective: Vitals:   02/01/21 0452 02/01/21 0500 02/01/21 0802 02/01/21 0835  BP: (!) 146/83   131/70  Pulse: (!) 106  (!) 107 (!) 111  Resp: Marland Kitchen)  23  (!) 26 (!) 23  Temp: 97.6 F (36.4 C)   98 F (36.7 C)  TempSrc: Oral   Oral  SpO2: 99%  98% 98%  Weight:  69.3 kg      Intake/Output Summary (Last 24 hours) at 02/01/2021 1204 Last data filed at 02/01/2021 0900 Gross per 24 hour  Intake 100 ml  Output 500 ml  Net -400 ml   Filed Weights   01/31/21 0500 02/01/21 0500  Weight: 67.4 kg 69.3 kg    Examination: General exam: Appears calm and comfortable  Respiratory system: Rhonchi and crackles bilaterally. Respiratory effort normal. Cardiovascular system: S1 & S2 heard, RRR. No JVD, murmurs, rubs, gallops or clicks. No pedal edema. Gastrointestinal system: Abdomen is nondistended, soft and nontender. No organomegaly or masses felt. Normal bowel sounds heard. Central nervous system: Alert and oriented. No focal neurological deficits. Extremities: Symmetric 5 x 5 power. Skin: No rashes, lesions or ulcers.  Psychiatry: Judgement and insight appear normal. Mood & affect appropriate.   Data Reviewed: I have personally reviewed following labs and imaging  studies  CBC: Recent Labs  Lab January 31, 2021 1625 01-31-21 1729 01/31/21 0334 01/31/21 0842 01/31/21 1811 02/01/21 0346  WBC 11.2*  --  11.3* 12.2* 12.1* 14.9*  NEUTROABS 8.1*  --   --  10.2* 9.8* 12.3*  HGB 7.9* 7.8* 10.0* 10.4* 9.8* 8.5*  HCT 25.6* 23.0* 30.3* 31.1* 30.2* 26.0*  MCV 105.3*  --  100.7* 100.6* 100.7* 101.2*  PLT 175  --  174 168 177 158   Basic Metabolic Panel: Recent Labs  Lab 2021-01-31 1625 January 31, 2021 1729 01/31/21 0334 02/01/21 0346  NA 139 140 140 141  K 5.1 5.2* 5.2* 5.3*  CL 107  --  108 109  CO2 25  --  26 26  GLUCOSE 143*  --  145* 117*  BUN 59*  --  53* 40*  CREATININE 1.17  --  1.27* 1.07  CALCIUM 7.7*  --  8.5* 8.1*  MG  --   --  2.2 2.1  PHOS  --   --  4.8*  --    GFR: Estimated Creatinine Clearance: 51.3 mL/min (by C-G formula based on SCr of 1.07 mg/dL). Liver Function Tests: Recent Labs  Lab 01/31/2021 1625 01/31/21 0334  AST 41 51*  ALT 37 46*  ALKPHOS 52 61  BILITOT 0.6 0.7  PROT 4.8* 5.8*  ALBUMIN 2.5* 3.1*   No results for input(s): LIPASE, AMYLASE in the last 168 hours. No results for input(s): AMMONIA in the last 168 hours. Coagulation Profile: Recent Labs  Lab January 31, 2021 1625 01/31/21 0334  INR 1.2 1.1   Cardiac Enzymes: No results for input(s): CKTOTAL, CKMB, CKMBINDEX, TROPONINI in the last 168 hours. BNP (last 3 results) No results for input(s): PROBNP in the last 8760 hours. HbA1C: No results for input(s): HGBA1C in the last 72 hours. CBG: No results for input(s): GLUCAP in the last 168 hours. Lipid Profile: No results for input(s): CHOL, HDL, LDLCALC, TRIG, CHOLHDL, LDLDIRECT in the last 72 hours. Thyroid Function Tests: No results for input(s): TSH, T4TOTAL, FREET4, T3FREE, THYROIDAB in the last 72 hours. Anemia Panel: Recent Labs    01/31/21 0334  VITAMINB12 1,748*  FOLATE 24.4  FERRITIN 205  TIBC 293  IRON 86   Sepsis Labs: Recent Labs  Lab 01-31-2021 1625 2021-01-31 1825 01/31/21 0334 01/31/21 0842   PROCALCITON  --   --   --  0.14  LATICACIDVEN 2.7* 2.4* 1.8  --  Recent Results (from the past 240 hour(s))  Resp Panel by RT-PCR (Flu A&B, Covid) Nasopharyngeal Swab     Status: None   Collection Time: 09/15/21  4:25 PM   Specimen: Nasopharyngeal Swab; Nasopharyngeal(NP) swabs in vial transport medium  Result Value Ref Range Status   SARS Coronavirus 2 by RT PCR NEGATIVE NEGATIVE Final    Comment: (NOTE) SARS-CoV-2 target nucleic acids are NOT DETECTED.  The SARS-CoV-2 RNA is generally detectable in upper respiratory specimens during the acute phase of infection. The lowest concentration of SARS-CoV-2 viral copies this assay can detect is 138 copies/mL. A negative result does not preclude SARS-Cov-2 infection and should not be used as the sole basis for treatment or other patient management decisions. A negative result may occur with  improper specimen collection/handling, submission of specimen other than nasopharyngeal swab, presence of viral mutation(s) within the areas targeted by this assay, and inadequate number of viral copies(<138 copies/mL). A negative result must be combined with clinical observations, patient history, and epidemiological information. The expected result is Negative.  Fact Sheet for Patients:  BloggerCourse.comhttps://www.fda.gov/media/152166/download  Fact Sheet for Healthcare Providers:  SeriousBroker.ithttps://www.fda.gov/media/152162/download  This test is no t yet approved or cleared by the [place] FDA and  has been authorized for detection and/or diagnosis of SARS-CoV-2 by FDA under an Emergency Use Authorization (EUA). This EUA will remain  in effect (meaning this test can be used) for the duration of the COVID-19 declaration under Section 564(b)(1) of the Act, 21 U.S.C.section 360bbb-3(b)(1), unless the authorization is terminated  or revoked sooner.       Influenza A by PCR NEGATIVE NEGATIVE Final   Influenza B by PCR NEGATIVE NEGATIVE Final    Comment:  (NOTE) The Xpert Xpress SARS-CoV-2/FLU/RSV plus assay is intended as an aid in the diagnosis of influenza from Nasopharyngeal swab specimens and should not be used as a sole basis for treatment. Nasal washings and aspirates are unacceptable for Xpert Xpress SARS-CoV-2/FLU/RSV testing.  Fact Sheet for Patients: BloggerCourse.comhttps://www.fda.gov/media/152166/download  Fact Sheet for Healthcare Providers: SeriousBroker.ithttps://www.fda.gov/media/152162/download  This test is not yet approved or cleared by the [place] FDA and has been authorized for detection and/or diagnosis of SARS-CoV-2 by FDA under an Emergency Use Authorization (EUA). This EUA will remain in effect (meaning this test can be used) for the duration of the COVID-19 declaration under Section 564(b)(1) of the Act, 21 U.S.C. section 360bbb-3(b)(1), unless the authorization is terminated or revoked.  Performed at [facility], [address] [place], [place] [address]   Blood Culture (routine x 2)     Status: None (Preliminary result)   Collection Time: 09/15/21  4:25 PM   Specimen: BLOOD  Result Value Ref Range Status   Specimen Description BLOOD LEFT ANTECUBITAL  Final   Special Requests   Final    BOTTLES DRAWN AEROBIC AND ANAEROBIC Blood Culture results may not be optimal due to an inadequate volume of blood received in culture bottles   Culture   Final    NO GROWTH 2 DAYS Performed at [facility], [address] [place], [place] [address]    Report Status PENDING  Incomplete  Blood Culture (routine x 2)     Status: None (Preliminary result)   Collection Time: 09/15/21  4:30 PM   Specimen: BLOOD  Result Value Ref Range Status   Specimen Description BLOOD RIGHT ANTECUBITAL  Final   Special Requests   Final    BOTTLES DRAWN AEROBIC AND ANAEROBIC Blood Culture results  may not be optimal due to an inadequate volume of blood received in culture bottles   Culture   Final    NO GROWTH 2 DAYS Performed at [facility], [address], [place], [place] [address]    Report Status PENDING  Incomplete      Radiology Studies: DG Chest Port 1 View  Result Date: 2021-02-28 CLINICAL DATA:  Sepsis EXAM: PORTABLE CHEST 1 VIEW COMPARISON:  12/22/2020 FINDINGS: The lungs are hyperinflated and there is a paucity of vasculature within the lung apices in keeping with changes of severe emphysema, similar to that noted on prior examination. No superimposed focal pulmonary infiltrate. No pneumothorax or pleural effusion. Cardiac size within normal limits. No acute bone abnormality. IMPRESSION: No active disease.  Severe emphysema. Electronically Signed   By: Helyn Numbers MD   On: Feb 28, 2021 17:28   ECHOCARDIOGRAM COMPLETE  Result Date: 01/31/2021    ECHOCARDIOGRAM REPORT   Patient Name:   Genaro A Wootan Date of Exam: 01/31/2021 Medical Rec #:  [id number]      Height:       71.0 in Accession #:    [id number]     Weight:       148.6 lb Date of Birth:  07/28/1936      BSA:          1.858 m Patient Age:    85 years       BP:           150/73 mmHg Patient Gender: M              HR:           104 bpm. Exam Location:  Inpatient Procedure: 2D Echo, Cardiac Doppler and Color Doppler Indications:    I50.9* Heart failure (unspecified)  History:        Patient has prior history of Echocardiogram examinations, most                 recent 10/15/2013. COPD; Risk Factors:Hypertension and                 Dyslipidemia.  Sonographer:    Roosvelt Maser RDCS Referring Phys: [id number] OLADAPO ADEFESO IMPRESSIONS  1. Left ventricular ejection fraction, by estimation, is 25 to 30%. The left ventricle has severely decreased function. Wall motion very difficult to assess due to significant ectopy and poor endocardial visualization, however, the inferior wall appears  more hypokinetic than the other LV segments. Left ventricular diastolic parameters are consistent with Grade I diastolic dysfunction (impaired relaxation).  2. Right ventricular systolic  function is normal. The right ventricular size is severely enlarged.  3. The mitral valve is grossly normal. Trivial mitral valve regurgitation.  4. The aortic valve is tricuspid. There is moderate calcification of the aortic valve. There is moderate thickening of the aortic valve. Aortic valve regurgitation is mild. Mild to moderate aortic valve sclerosis/calcification is present, without any evidence of aortic stenosis.  5. Aortic dilatation noted. There is mild dilatation of the ascending aorta, measuring 36 mm. Comparison(s): Compared to prior echo report in 2014, the LVEF is now severely reduced 25-30% (previously 45%) and the RV is severely dilated (previously reported as normal in size). FINDINGS  Left Ventricle: Wall motion very difficult to assess due to significant ectopy and poor endocardial visualization, however, the inferior wall appears more hypokinetic than the other LV segments. Left ventricular ejection fraction, by estimation, is 25 to 30%. The left ventricle has severely decreased function.  Wall motion very difficult to assess due to significant ectopy and poor endocardial visualization, however, the inferior wall appears more hypokinetic than the other LV segments.The left ventricular internal cavity size was normal in size. There is no left ventricular hypertrophy. Left ventricular diastolic parameters are consistent with Grade I diastolic dysfunction (impaired relaxation). Right Ventricle: The right ventricular size is severely enlarged. No increase in right ventricular wall thickness. Right ventricular systolic function is normal. Left Atrium: Left atrial size was not well visualized. Right Atrium: Right atrial size was normal in size. Pericardium: There is no evidence of pericardial effusion. Mitral Valve: The mitral valve is grossly normal. There is mild thickening of the mitral valve leaflet(s). There is mild calcification of the mitral valve leaflet(s). Mild to moderate mitral annular  calcification. Trivial mitral valve regurgitation. Tricuspid Valve: The tricuspid valve is normal in structure. Tricuspid valve regurgitation is trivial. Aortic Valve: The aortic valve is tricuspid. There is moderate calcification of the aortic valve. There is moderate thickening of the aortic valve. Aortic valve regurgitation is mild. Mild to moderate aortic valve sclerosis/calcification is present, without any evidence of aortic stenosis. Aortic valve mean gradient measures 5.0 mmHg. Aortic valve peak gradient measures 12.2 mmHg. Aortic valve area, by VTI measures 1.44 cm. Pulmonic Valve: The pulmonic valve was normal in structure. Pulmonic valve regurgitation is trivial. Aorta: Aortic dilatation noted. There is mild dilatation of the ascending aorta, measuring 36 mm. IAS/Shunts: No atrial level shunt detected by color flow Doppler.  LEFT VENTRICLE PLAX 2D LVIDd:         4.20 cm      Diastology LVIDs:         3.60 cm      LV e' medial:    13.70 cm/s LV PW:         1.00 cm      LV E/e' medial:  4.0 LV IVS:        0.90 cm      LV e' lateral:   4.90 cm/s LVOT diam:     2.00 cm      LV E/e' lateral: 11.3 LV SV:         36 LV SV Index:   20 LVOT Area:     3.14 cm  LV Volumes (MOD) LV vol d, MOD A2C: 137.0 ml LV vol d, MOD A4C: 111.0 ml LV vol s, MOD A2C: 94.8 ml LV vol s, MOD A4C: 77.0 ml LV SV MOD A2C:     42.2 ml LV SV MOD A4C:     111.0 ml LV SV MOD BP:      35.3 ml RIGHT VENTRICLE RV Basal diam:  4.90 cm RV Mid diam:    6.10 cm RV S prime:     12.70 cm/s TAPSE (M-mode): 2.5 cm LEFT ATRIUM             Index       RIGHT ATRIUM           Index LA diam:        3.60 cm 1.94 cm/m  RA Area:     13.50 cm LA Vol (A2C):   77.5 ml 41.70 ml/m RA Volume:   35.40 ml  19.05 ml/m LA Vol (A4C):   50.4 ml 27.12 ml/m LA Biplane Vol: 61.8 ml 33.25 ml/m  AORTIC VALVE AV Area (Vmax):    1.52 cm AV Area (Vmean):   1.60 cm AV Area (VTI):     1.44 cm AV Vmax:  175.00 cm/s AV Vmean:          106.000 cm/s AV VTI:             0.253 m AV Peak Grad:      12.2 mmHg AV Mean Grad:      5.0 mmHg LVOT Vmax:         84.90 cm/s LVOT Vmean:        54.100 cm/s LVOT VTI:          0.116 m LVOT/AV VTI ratio: 0.46  AORTA Ao Root diam: 3.50 cm Ao Asc diam:  3.60 cm MITRAL VALVE MV Area (PHT): 2.91 cm    SHUNTS MV Decel Time: 261 msec    Systemic VTI:  0.12 m MV E velocity: 55.30 cm/s  Systemic Diam: 2.00 cm MV A velocity: 87.40 cm/s MV E/A ratio:  0.63 Laurance Flatten MD Electronically signed by Laurance Flatten MD Signature Date/Time: 01/31/2021/3:52:46 PM    Final     Scheduled Meds: . sodium chloride   Intravenous Once  . allopurinol  100 mg Oral Daily  . aspirin  81 mg Oral Daily  . budesonide  0.5 mg Nebulization BID  . calcium carbonate  625 mg Oral Daily  . dextromethorphan-guaiFENesin  1 tablet Oral BID  . feeding supplement (GLUCERNA SHAKE)  237 mL Oral TID BM  . furosemide  40 mg Intravenous Once  . ipratropium-albuterol  3 mL Nebulization Q6H  . multivitamin with minerals  1 tablet Oral Daily  . niacin  250 mg Oral QHS  . [START ON 02/03/2021] pantoprazole  40 mg Intravenous Q12H  . predniSONE  10 mg Oral Q breakfast  . tamsulosin  0.4 mg Oral Daily  . vitamin B-12  1,000 mcg Oral Daily   Continuous Infusions: . sodium chloride 75 mL/hr at 01/31/21 1301  . pantoprozole (PROTONIX) infusion 8 mg/hr (02/01/21 0838)     LOS: 2 days   Time spent: 30 minutes   Hughie Closs, MD [facility]  02/01/2021, 12:04 PM   To contact the attending provider between 7A-7P or the covering provider during after hours 7P-7A, please log into the web site [email or web address].

## 2021-02-01 NOTE — Progress Notes (Signed)
[facility] Gastroenterology Progress Note  KEVONTE VANECEK 85 y.o. [birth year]  CC:  Melena, anemia  Subjective: Patient reports increased shortness of breath this morning. Denies abdominal pain, nausea, vomiting.  Has not had a stool today or yesterday.  ROS : Review of Systems  Respiratory: Positive for shortness of breath. Negative for cough.   Gastrointestinal: Negative for abdominal pain, blood in stool, constipation, diarrhea, heartburn, melena, nausea and vomiting.   Objective: Vital signs in last 24 hours: Vitals:   02/01/21 0802 02/01/21 0835  BP:  131/70  Pulse: (!) 107 (!) 111  Resp: (!) 26 (!) 23  Temp:  98 F (36.7 C)  SpO2: 98% 98%    Physical Exam:  General:  Chronically ill appearing, oriented, cooperative, no distress  Head:  Normocephalic, without obvious abnormality, atraumatic  Eyes:  Mild conjunctival pallor, EOMs intact  Lungs:   Tachypneic but not in respiratory distress  Heart:  Mildly tachycardic  Abdomen:   Soft, non-tender, non-distended   Extremities: Extremities normal, atraumatic, no  edema  Pulses: Radial pulse 2+     Lab Results: Recent Labs    01/31/21 0334 02/01/21 0346  NA 140 141  K 5.2* 5.3*  CL 108 109  CO2 26 26  GLUCOSE 145* 117*  BUN 53* 40*  CREATININE 1.27* 1.07  CALCIUM 8.5* 8.1*  MG 2.2 2.1  PHOS 4.8*  --    Recent Labs    02/02/2021 1625 01/31/21 0334  AST 41 51*  ALT 37 46*  ALKPHOS 52 61  BILITOT 0.6 0.7  PROT 4.8* 5.8*  ALBUMIN 2.5* 3.1*   Recent Labs    01/31/21 1811 02/01/21 0346  WBC 12.1* 14.9*  NEUTROABS 9.8* 12.3*  HGB 9.8* 8.5*  HCT 30.2* 26.0*  MCV 100.7* 101.2*  PLT 177 158   Recent Labs    01/18/2021 1625 01/31/21 0334  LABPROT 15.5* 14.0  INR 1.2 1.1     Assessment: Suspected upper GI bleeding: He reports melena for few days, as well as a few days of dysphagia.  Esophagitis versus gastritis versus PUD.  Lesion/malignancy also in the differential. -Hemoglobin 7.9 on arrival, improved  to 10.0 after 1u pRBCs, decreased this morning to 8.5 without any signs of overt bleeding -BUN 40, improving (elevated to 59 on arrival) -Normal PT/INR 14/1.0 as of 01/31/21 -Macrocytosis present -Normal ferritin, iron panel, folate, and elevated B12  Chronic respiratory failure with hypoxia secondary to COPD, on 10L  Plan: Continued conservative management, given increased work of breathing and baseline severe COPD.  Continue IV Protonix drip for a total of 72h, followed by BID dosing.  Clear liquid diet OK from a GI standpoint.  If continued bleeding, consider EGD, if OK from pulmonology and anesthesia standpoint (may need anesthesia consult).    If no further bleeding, consider upper GI series to rule out significant lesion.  Continue supportive care.  Continue to monitor H&H with transfusion as needed to maintain Hgb >7.  [facility] will follow.  Edrick Kins PA-C 02/01/2021, 9:42 AM  Contact #  [phone]

## 2021-02-02 ENCOUNTER — Inpatient Hospital Stay (HOSPITAL_COMMUNITY): Payer: PPO

## 2021-02-02 DIAGNOSIS — E43 Unspecified severe protein-calorie malnutrition: Secondary | ICD-10-CM | POA: Insufficient documentation

## 2021-02-02 DIAGNOSIS — K922 Gastrointestinal hemorrhage, unspecified: Secondary | ICD-10-CM | POA: Diagnosis not present

## 2021-02-02 LAB — BASIC METABOLIC PANEL
Anion gap: 6 (ref 5–15)
BUN: 28 mg/dL — ABNORMAL HIGH (ref 8–23)
CO2: 28 mmol/L (ref 22–32)
Calcium: 8 mg/dL — ABNORMAL LOW (ref 8.9–10.3)
Chloride: 106 mmol/L (ref 98–111)
Creatinine, Ser: 0.99 mg/dL (ref 0.61–1.24)
GFR, Estimated: >60 mL/min (ref >60)
Glucose, Bld: 101 mg/dL — ABNORMAL HIGH (ref 70–99)
Potassium: 4.1 mmol/L (ref 3.5–5.1)
Sodium: 140 mmol/L (ref 135–145)

## 2021-02-02 LAB — CBC WITH DIFFERENTIAL/PLATELET
Abs Immature Granulocytes: 0.18 10*3/uL — ABNORMAL HIGH (ref 0.00–0.07)
Basophils Absolute: 0 10*3/uL (ref 0.0–0.1)
Basophils Relative: 0 %
Eosinophils Absolute: 0 10*3/uL (ref 0.0–0.5)
Eosinophils Relative: 0 %
HCT: 26 % — ABNORMAL LOW (ref 39.0–52.0)
Hemoglobin: 8.5 g/dL — ABNORMAL LOW (ref 13.0–17.0)
Immature Granulocytes: 2 %
Lymphocytes Relative: 14 %
Lymphs Abs: 1.7 10*3/uL (ref 0.7–4.0)
MCH: 32.9 pg (ref 26.0–34.0)
MCHC: 32.7 g/dL (ref 30.0–36.0)
MCV: 100.8 fL — ABNORMAL HIGH (ref 80.0–100.0)
Monocytes Absolute: 1 10*3/uL (ref 0.1–1.0)
Monocytes Relative: 8 %
Neutro Abs: 9.5 10*3/uL — ABNORMAL HIGH (ref 1.7–7.7)
Neutrophils Relative %: 76 %
Platelets: 166 10*3/uL (ref 150–400)
RBC: 2.58 MIL/uL — ABNORMAL LOW (ref 4.22–5.81)
RDW: 16.1 % — ABNORMAL HIGH (ref 11.5–15.5)
WBC: 12.4 10*3/uL — ABNORMAL HIGH (ref 4.0–10.5)
nRBC: 1 % — ABNORMAL HIGH (ref 0.0–0.2)

## 2021-02-02 LAB — CBC
HCT: 25 % — ABNORMAL LOW (ref 39.0–52.0)
Hemoglobin: 8.1 g/dL — ABNORMAL LOW (ref 13.0–17.0)
MCH: 32.9 pg (ref 26.0–34.0)
MCHC: 32.4 g/dL (ref 30.0–36.0)
MCV: 101.6 fL — ABNORMAL HIGH (ref 80.0–100.0)
Platelets: 148 10*3/uL — ABNORMAL LOW (ref 150–400)
RBC: 2.46 MIL/uL — ABNORMAL LOW (ref 4.22–5.81)
RDW: 16.3 % — ABNORMAL HIGH (ref 11.5–15.5)
WBC: 11.7 10*3/uL — ABNORMAL HIGH (ref 4.0–10.5)
nRBC: 0.4 % — ABNORMAL HIGH (ref 0.0–0.2)

## 2021-02-02 LAB — RENAL FUNCTION PANEL
Albumin: 2.6 g/dL — ABNORMAL LOW (ref 3.5–5.0)
Anion gap: 10 (ref 5–15)
BUN: 27 mg/dL — ABNORMAL HIGH (ref 8–23)
CO2: 30 mmol/L (ref 22–32)
Calcium: 8.2 mg/dL — ABNORMAL LOW (ref 8.9–10.3)
Chloride: 98 mmol/L (ref 98–111)
Creatinine, Ser: 1.08 mg/dL (ref 0.61–1.24)
GFR, Estimated: >60 mL/min (ref >60)
Glucose, Bld: 97 mg/dL (ref 70–99)
Phosphorus: 2.1 mg/dL — ABNORMAL LOW (ref 2.5–4.6)
Potassium: 4.1 mmol/L (ref 3.5–5.1)
Sodium: 138 mmol/L (ref 135–145)

## 2021-02-02 LAB — MAGNESIUM: Magnesium: 2 mg/dL (ref 1.7–2.4)

## 2021-02-02 MED ORDER — LACTATED RINGERS IV BOLUS: 250.0000 mL | Freq: Once | INTRAVENOUS | Status: DC

## 2021-02-02 MED ORDER — FUROSEMIDE 10 MG/ML IJ SOLN
40.0000 mg | Freq: Once | INTRAMUSCULAR | Status: AC
  Administered 2021-02-02: 40 mg via INTRAVENOUS
  Filled 2021-02-02: qty 4

## 2021-02-02 MED ORDER — BOOST / RESOURCE BREEZE PO LIQD CUSTOM
1.0000 | Freq: Three times a day (TID) | ORAL | Status: DC
  Administered 2021-02-02 – 2021-02-03 (×4): 1 via ORAL
  Filled 2021-02-02 (×2): qty 1

## 2021-02-02 MED ORDER — PANTOPRAZOLE SODIUM 40 MG PO TBEC
40.0000 mg | DELAYED_RELEASE_TABLET | Freq: Two times a day (BID) | ORAL | Status: DC
  Administered 2021-02-02 – 2021-02-08 (×12): 40 mg via ORAL
  Filled 2021-02-02 (×12): qty 1

## 2021-02-02 MED ORDER — PROSOURCE PLUS PO LIQD
30.0000 mL | Freq: Two times a day (BID) | ORAL | Status: DC
  Administered 2021-02-03 – 2021-02-08 (×10): 30 mL via ORAL
  Filled 2021-02-02 (×6): qty 30

## 2021-02-02 NOTE — Care Management Important Message (Signed)
Important Message  Patient Details  Name: Charles Archer MRN: [id number] Date of Birth: March 17, 1936   Medicare Important Message Given:  Yes     [name] Stefan Church 02/02/2021, 2:46 PM

## 2021-02-02 NOTE — Progress Notes (Signed)
HOSPITAL MEDICINE OVERNIGHT EVENT NOTE    Notified by nursing that patient is exhibiting sudden worsening of tachycardia, now with heart rate in the 150s.  Blood pressures are stable, patient denies any significant change in respiratory status and denies any new onset chest pain.  EKG reveals what seems to be sinus tachycardia in the 150s.  Patient evaluated at bedside, patient exhibits no obvious evidence of volume overload although patient is currently receiving intravenous Lasix by the ED provider.  By the time I was done evaluating the patient, heart rate has come back down to his "baseline" of 110's to 120.  Labs reviewed.  Will obtain repeat chemistry and magnesium and correct electrolyte abnormalities as necessary.  We will additionally obtain repeat CBC considering that hemoglobin hematocrit seem to have been downtrending somewhat.  Continue to monitor patient closely on telemetry.  Marinda Elk  MD [facility]

## 2021-02-02 NOTE — Progress Notes (Signed)
PROGRESS NOTE    Charles Archer  OVZ:[id number] DOB: December 29, 1935 DOA: Feb 11, 2021 PCP: Daisy Floro, MD   Brief Narrative:  Charles Archer is a 85 y.o. male with medical history significant for COPD on 10 L of oxygen via HFNC, BPH, hypertension, hyperlipidemia, gout who presented to ED due to shortness of on exertion since Friday (4/15), he endorsed urinary incontinence while on his wheelchair on Thursday (4/14),  he also noted that his stool was black.  Appetite has decreased since onset of symptoms on Friday.  Family activated EMS due to patient being lethargic and suspicion for sepsis, on arrival of EMS team, patient was reported to be in respiratory distress with wheezes and crackles, which resolved with lying supine.  He was also reported to be hypotensive, so 1 L of NS was given in route.  Patient endorsed increased cough with occasional clear phlegm and chronic mid back pain due to T3-T4  compression fracture, but denied fever, chills, chest pain, abdominal pain.  In the emergency department, he was tachycardic and tachypneic.  Work-up in the ED showed leukocytosis, macrocytic anemia, BUN was elevated at 59, CBG of 43, lactic acid 2.7, BNP 132.1, FOBT was positive.  Albumin 2.5.  Influenza A, B and SARS coronavirus 2 was negative. Chest x-ray showed severe emphysema with no active disease Breathing treatment was provided, Solu-Medrol was given. Patient was started on IV ceftriaxone due to presumed urosepsis.  Patient was started on IV Protonix drip.    Patient was admitted under hospitalist service with presumed sepsis due to UTI however patient's UA was completely unremarkable so antibiotics were discontinued next day.  He also had mild lactic acidosis due to multiple factors which resolved as well.  Also admitted for upper GI bleed/melena.  GI consulted however due to his advanced COPD and respiratory issues, they preferred conservative management and did not recommend EGD or colonoscopy.   They recommended weaning Protonix for total of 72 hours.  His hemoglobin remained stable.  patient then had slightly elevated but flat troponins and transthoracic echo showed further reduction in ejection fraction to 20 to 25% from 45% previously.  For this cardiology was consulted and they also recommended conservative management due to his advanced COPD.  His mild AKI had resolved with IV fluids.   Assessment & Plan:   Principal Problem:   Upper GI bleed Active Problems:   HTN (hypertension)   Hyperlipidemia   COPD (chronic obstructive pulmonary disease) (HCC)   Hypoxemic respiratory failure, chronic (HCC)   Physical deconditioning   Sepsis secondary to UTI (HCC)   Lactic acidosis   Acute dehydration   Hypoalbuminemia   Hyperglycemia   Leukocytosis   Macrocytic anemia   Elevated brain natriuretic peptide (BNP) level   Gout   Elevated troponin   Symptomatic anemia   Protein-calorie malnutrition, severe  Sepsis ruled out: Patient was admitted as presumed sepsis based off of tachycardia and tachypnea and possible UTI however patient's UA is completely clean with no signs of infection.  I do not think he has UTI.  His tachycardia and tachypnea was likely secondary to GI bleed and his COPD.  Antibiotics discontinued.  Lactic acidosis possibly secondary to multifactorial including above, hypoxia, dehydration.  Resolved  Upper GI bleed/melena Symptomatic anemia/Macrocytic Anemia H/H= 7.9/25.6, this was 15.8/46.3 (about 7 years ago; most recent lab for comparison) Hemoccult was positive.  He received 1 unit of PRBC transfusion.  Hemoglobin improved to 10.  Seen by GI.  Due to his  severe COPD, they recommend conservative management and recommend continuing Protonix drip for at least 3 days and then oral PPI twice daily unless he has severe bleeding.  They could consider doing EGD but he would need anesthesia consult.  Monitor H&H every 12 hours for next 1 to 2 days.  Hemoglobin yesterday  was 8.9.  Repeating labs today.  GI has now ordered upper GI series for him.  Appreciate GI help.  Chronic respiratory failure with hypoxia secondary to COPD: Reportedly, patient uses 10 L of high flow oxygen at home.  Currently he is at his baseline oxygen requirement however today he feels more short of breath.  BNP checked that is elevated.  Wonder if he has some component of acute on chronic combined congestive heart failure.  Gave him a dose of Lasix IV yesterday.  Patient feels better today.  We will give him another dose of IV Lasix today. Continue duo nebs, Mucinex, Solu-Medrol, albuterol, Pulmicort Continue incentive spirometry and flutter valve Continue supplemental oxygen to maintain O2 sat > 92%   Acute on chronic combined systolic and diastolic congestive heart failure: Echo on 02/01/2019 shows reduced ejection fraction of 25 to 30% compared to 45% in 2014 as well as grade 1 diastolic dysfunction.  Has slightly elevated cardiac enzymes but flat. BNP 132 and now 333 on 02/01/2021.  Given 1 dose of IV Lasix.  Patient has improved today.  We will give him another dose of Lasix today.  Cardiology on board and they recommend pursuing conservative management due to his end-stage COPD.  Hope that cardiology would recommend further about ongoing diuretics.  Hyperkalemia: Resolved  Mild AKI: Resolved.  Stop IV fluids.  Dehydration: Resolved.    Leukocytosis/Hyperglycemia possibly related to prednisone effect WBC 11.7, CBG 143.  Patient is chronically on prednisone at home Continue to monitor WBC and CBG with morning labs  Hypoalbuminemia possibly secondary to moderate protein calorie malnutrition.  Consult dietitian.  Hypertension: Blood pressure stable.  Avapro on hold due to recent AKI.  Continue to hold.  Hyperlipidemia Continue Niacin  Chronic back pain Continue Tylenol as needed  BPH Continue Flomax  Gout: Continue allopurinol  DVT prophylaxis: SCDs Start: 02/11/2021  2010   Code Status: Full Code  Family Communication: None present at bedside.  I was able to discuss plan of care with the daughter yesterday.  Called again  and left a voicemail yesterday with  Status is: Inpatient  Remains inpatient appropriate because:Inpatient level of care appropriate due to severity of illness   Dispo: The patient is from: Home              Anticipated d/c is to: Home              Patient currently is not medically stable to d/c.   Difficult to place patient No        Estimated body mass index is 21.03 kg/m as calculated from the following:   Height as of 12/22/20:  (1.803 m).   Weight as of this encounter: 68.4 kg.      Nutritional status:  Nutrition Problem: Severe Malnutrition Etiology: chronic illness (COPD)   Signs/Symptoms: severe fat depletion,severe muscle depletion,percent weight loss Percent weight loss: 13.1 %   Interventions: Refer to RD note for recommendations,Boost Breeze,MVI,Prostat    Consultants:   GI  Procedures:   None  Antimicrobials:  Anti-infectives (From admission, onward)   Start     Dose/Rate Route Frequency Ordered Stop   01/31/21 0800  cefTRIAXone (ROCEPHIN) 1 g in sodium chloride 0.9 % 100 mL IVPB  Status:  Discontinued        1 g 200 mL/hr over 30 Minutes Intravenous Every 24 hours 02/06/2021 2049 01/31/21 1023   02/05/2021 1645  cefTRIAXone (ROCEPHIN) 1 g in sodium chloride 0.9 % 100 mL IVPB        1 g 200 mL/hr over 30 Minutes Intravenous  Once 02/01/2021 1644 01/28/2021 1745         Subjective: Seen and examined.  He states that his breathing is much better than yesterday.  No new complaint.  Objective: Vitals:   02/02/21 0500 02/02/21 0729 02/02/21 0743 02/02/21 1204  BP:   121/69 121/75  Pulse:   (!) 106 95  Resp:   20 20  Temp:   98.6 F (37 C) 97.8 F (36.6 C)  TempSrc:   Oral Oral  SpO2:  94% 100% 100%  Weight: 68.4 kg       Intake/Output Summary (Last 24 hours) at 02/02/2021  1221 Last data filed at 02/02/2021 1057 Gross per 24 hour  Intake --  Output 2100 ml  Net -2100 ml   Filed Weights   01/31/21 0500 02/01/21 0500 02/02/21 0500  Weight: 67.4 kg 69.3 kg 68.4 kg    Examination: General exam: Appears calm and comfortable  Respiratory system: Bilateral rhonchi and crackles. Respiratory effort normal. Cardiovascular system: S1 & S2 heard, RRR. No JVD, murmurs, rubs, gallops or clicks. No pedal edema. Gastrointestinal system: Abdomen is nondistended, soft and nontender. No organomegaly or masses felt. Normal bowel sounds heard. Central nervous system: Alert and oriented. No focal neurological deficits. Extremities: Symmetric 5 x 5 power. Skin: No rashes, lesions or ulcers.  Psychiatry: Judgement and insight appear normal. Mood & affect appropriate.   Data Reviewed: I have personally reviewed following labs and imaging studies  CBC: Recent Labs  Lab 02/11/2021 1625 02/11/2021 1729 01/31/21 0334 01/31/21 0842 01/31/21 1811 02/01/21 0346 02/01/21 1619  WBC 11.2*  --  11.3* 12.2* 12.1* 14.9* 14.6*  NEUTROABS 8.1*  --   --  10.2* 9.8* 12.3* 12.3*  HGB 7.9*   < > 10.0* 10.4* 9.8* 8.5* 8.9*  HCT 25.6*   < > 30.3* 31.1* 30.2* 26.0* 27.7*  MCV 105.3*  --  100.7* 100.6* 100.7* 101.2* 103.0*  PLT 175  --  174 168 177 158 183   < > = values in this interval not displayed.   Basic Metabolic Panel: Recent Labs  Lab 01/23/2021 1625 01/22/2021 1729 01/31/21 0334 02/01/21 0346 02/02/21 0254  NA 139 140 140 141 140  K 5.1 5.2* 5.2* 5.3* 4.1  CL 107  --  108 109 106  CO2 25  --  GLUCOSE 143*  --  145* 117* 101*  BUN 59*  --  53* 40* 28*  CREATININE 1.17  --  1.27* 1.07 0.99  CALCIUM 7.7*  --  8.5* 8.1* 8.0*  MG  --   --  2.2 2.1  --   PHOS  --   --  4.8*  --   --    GFR: Estimated Creatinine Clearance: 54.7 mL/min (by C-G formula based on SCr of 0.99 mg/dL). Liver Function Tests: Recent Labs  Lab 02/07/2021 1625 01/31/21 0334  AST 41 51*   ALT 37 46*  ALKPHOS 52 61  BILITOT 0.6 0.7  PROT 4.8* 5.8*  ALBUMIN 2.5* 3.1*   No results for input(s): LIPASE, AMYLASE in the last 168  hours. No results for input(s): AMMONIA in the last 168 hours. Coagulation Profile: Recent Labs  Lab Feb 03, 2021 1625 01/31/21 0334  INR 1.2 1.1   Cardiac Enzymes: No results for input(s): CKTOTAL, CKMB, CKMBINDEX, TROPONINI in the last 168 hours. BNP (last 3 results) No results for input(s): PROBNP in the last 8760 hours. HbA1C: No results for input(s): HGBA1C in the last 72 hours. CBG: No results for input(s): GLUCAP in the last 168 hours. Lipid Profile: No results for input(s): CHOL, HDL, LDLCALC, TRIG, CHOLHDL, LDLDIRECT in the last 72 hours. Thyroid Function Tests: No results for input(s): TSH, T4TOTAL, FREET4, T3FREE, THYROIDAB in the last 72 hours. Anemia Panel: Recent Labs    01/31/21 0334  VITAMINB12 1,748*  FOLATE 24.4  FERRITIN 205  TIBC 293  IRON 86   Sepsis Labs: Recent Labs  Lab 2021/02/03 1625 February 03, 2021 1825 01/31/21 0334 01/31/21 0842  PROCALCITON  --   --   --  0.14  LATICACIDVEN 2.7* 2.4* 1.8  --     Recent Results (from the past 240 hour(s))  Resp Panel by RT-PCR (Flu A&B, Covid) Nasopharyngeal Swab     Status: None   Collection Time: 02-03-2021  4:25 PM   Specimen: Nasopharyngeal Swab; Nasopharyngeal(NP) swabs in vial transport medium  Result Value Ref Range Status   SARS Coronavirus 2 by RT PCR NEGATIVE NEGATIVE Final    Comment: (NOTE) SARS-CoV-2 target nucleic acids are NOT DETECTED.  The SARS-CoV-2 RNA is generally detectable in upper respiratory specimens during the acute phase of infection. The lowest concentration of SARS-CoV-2 viral copies this assay can detect is 138 copies/mL. A negative result does not preclude SARS-Cov-2 infection and should not be used as the sole basis for treatment or other patient management decisions. A negative result may occur with  improper specimen collection/handling,  submission of specimen other than nasopharyngeal swab, presence of viral mutation(s) within the areas targeted by this assay, and inadequate number of viral copies(<138 copies/mL). A negative result must be combined with clinical observations, patient history, and epidemiological information. The expected result is Negative.  Fact Sheet for Patients:  BloggerCourse.com  Fact Sheet for Healthcare Providers:  SeriousBroker.it  This test is no t yet approved or cleared by the [place] FDA and  has been authorized for detection and/or diagnosis of SARS-CoV-2 by FDA under an Emergency Use Authorization (EUA). This EUA will remain  in effect (meaning this test can be used) for the duration of the COVID-19 declaration under Section 564(b)(1) of the Act, 21 U.S.C.section 360bbb-3(b)(1), unless the authorization is terminated  or revoked sooner.       Influenza A by PCR NEGATIVE NEGATIVE Final   Influenza B by PCR NEGATIVE NEGATIVE Final    Comment: (NOTE) The Xpert Xpress SARS-CoV-2/FLU/RSV plus assay is intended as an aid in the diagnosis of influenza from Nasopharyngeal swab specimens and should not be used as a sole basis for treatment. Nasal washings and aspirates are unacceptable for Xpert Xpress SARS-CoV-2/FLU/RSV testing.  Fact Sheet for Patients: BloggerCourse.com  Fact Sheet for Healthcare Providers: SeriousBroker.it  This test is not yet approved or cleared by the [place] FDA and has been authorized for detection and/or diagnosis of SARS-CoV-2 by FDA under an Emergency Use Authorization (EUA). This EUA will remain in effect (meaning this test can be used) for the duration of the COVID-19 declaration under Section 564(b)(1) of the Act, 21 U.S.C. section 360bbb-3(b)(1), unless the authorization is terminated or revoked.  Performed at [facility],  [address] [place], [place] [address]   Blood Culture (routine x 2)     Status: None (Preliminary result)   Collection Time: 01/22/2021  4:25 PM   Specimen: BLOOD  Result Value Ref Range Status   Specimen Description BLOOD LEFT ANTECUBITAL  Final   Special Requests   Final    BOTTLES DRAWN AEROBIC AND ANAEROBIC Blood Culture results may not be optimal due to an inadequate volume of blood received in culture bottles   Culture   Final    NO GROWTH 3 DAYS Performed at [facility], [address] [place], [place] [address]    Report Status PENDING  Incomplete  Blood Culture (routine x 2)     Status: None (Preliminary result)   Collection Time: 01/28/2021  4:30 PM   Specimen: BLOOD  Result Value Ref Range Status   Specimen Description BLOOD RIGHT ANTECUBITAL  Final   Special Requests   Final    BOTTLES DRAWN AEROBIC AND ANAEROBIC Blood Culture results may not be optimal due to an inadequate volume of blood received in culture bottles   Culture   Final    NO GROWTH 3 DAYS Performed at [facility], [address] [place], [place] [address]    Report Status PENDING  Incomplete      Radiology Studies: DG CHEST PORT 1 VIEW  Result Date: 02/01/2021 CLINICAL DATA:  Shortness of breath. EXAM: PORTABLE CHEST 1 VIEW COMPARISON:  Chest x-ray dated January 30, 2021. FINDINGS: The heart size and mediastinal contours are within normal limits. The lungs remain hyperinflated with severe emphysematous changes. Scattered pleuroparenchymal scarring, also involving both costophrenic angles. No focal consolidation, pleural effusion, or pneumothorax. No acute osseous abnormality. IMPRESSION: 1. COPD. No active disease. Electronically Signed   By: Obie DredgeWilliam T Derry M.D.   On: 02/01/2021 16:42   ECHOCARDIOGRAM COMPLETE  Result Date: 01/31/2021    ECHOCARDIOGRAM REPORT   Patient Name:   Daris A Ethier Date of Exam: 01/31/2021 Medical Rec #:  [id number]      Height:       71.0 in Accession #:    [id number]      Weight:       148.6 lb Date of Birth:  1935-10-25      BSA:          1.858 m Patient Age:    85 years       BP:           150/73 mmHg Patient Gender: M              HR:           104 bpm. Exam Location:  Inpatient Procedure: 2D Echo, Cardiac Doppler and Color Doppler Indications:    I50.9* Heart failure (unspecified)  History:        Patient has prior history of Echocardiogram examinations, most                 recent 10/15/2013. COPD; Risk Factors:Hypertension and                 Dyslipidemia.  Sonographer:    Roosvelt Maserachel Lane RDCS Referring Phys: [id number] OLADAPO ADEFESO IMPRESSIONS  1. Left ventricular ejection fraction, by estimation, is 25 to 30%. The left ventricle has severely decreased function. Wall motion very difficult to assess due to significant ectopy and poor endocardial visualization, however, the inferior wall appears  more hypokinetic than the other LV segments. Left ventricular diastolic parameters  are consistent with Grade I diastolic dysfunction (impaired relaxation).  2. Right ventricular systolic function is normal. The right ventricular size is severely enlarged.  3. The mitral valve is grossly normal. Trivial mitral valve regurgitation.  4. The aortic valve is tricuspid. There is moderate calcification of the aortic valve. There is moderate thickening of the aortic valve. Aortic valve regurgitation is mild. Mild to moderate aortic valve sclerosis/calcification is present, without any evidence of aortic stenosis.  5. Aortic dilatation noted. There is mild dilatation of the ascending aorta, measuring 36 mm. Comparison(s): Compared to prior echo report in 2014, the LVEF is now severely reduced 25-30% (previously 45%) and the RV is severely dilated (previously reported as normal in size). FINDINGS  Left Ventricle: Wall motion very difficult to assess due to significant ectopy and poor endocardial visualization, however, the inferior wall appears more hypokinetic than the other LV segments. Left  ventricular ejection fraction, by estimation, is 25 to 30%. The left ventricle has severely decreased function. Wall motion very difficult to assess due to significant ectopy and poor endocardial visualization, however, the inferior wall appears more hypokinetic than the other LV segments.The left ventricular internal cavity size was normal in size. There is no left ventricular hypertrophy. Left ventricular diastolic parameters are consistent with Grade I diastolic dysfunction (impaired relaxation). Right Ventricle: The right ventricular size is severely enlarged. No increase in right ventricular wall thickness. Right ventricular systolic function is normal. Left Atrium: Left atrial size was not well visualized. Right Atrium: Right atrial size was normal in size. Pericardium: There is no evidence of pericardial effusion. Mitral Valve: The mitral valve is grossly normal. There is mild thickening of the mitral valve leaflet(s). There is mild calcification of the mitral valve leaflet(s). Mild to moderate mitral annular calcification. Trivial mitral valve regurgitation. Tricuspid Valve: The tricuspid valve is normal in structure. Tricuspid valve regurgitation is trivial. Aortic Valve: The aortic valve is tricuspid. There is moderate calcification of the aortic valve. There is moderate thickening of the aortic valve. Aortic valve regurgitation is mild. Mild to moderate aortic valve sclerosis/calcification is present, without any evidence of aortic stenosis. Aortic valve mean gradient measures 5.0 mmHg. Aortic valve peak gradient measures 12.2 mmHg. Aortic valve area, by VTI measures 1.44 cm. Pulmonic Valve: The pulmonic valve was normal in structure. Pulmonic valve regurgitation is trivial. Aorta: Aortic dilatation noted. There is mild dilatation of the ascending aorta, measuring 36 mm. IAS/Shunts: No atrial level shunt detected by color flow Doppler.  LEFT VENTRICLE PLAX 2D LVIDd:         4.20 cm      Diastology LVIDs:          3.60 cm      LV e' medial:    13.70 cm/s LV PW:         1.00 cm      LV E/e' medial:  4.0 LV IVS:        0.90 cm      LV e' lateral:   4.90 cm/s LVOT diam:     2.00 cm      LV E/e' lateral: 11.3 LV SV:         36 LV SV Index:   20 LVOT Area:     3.14 cm  LV Volumes (MOD) LV vol d, MOD A2C: 137.0 ml LV vol d, MOD A4C: 111.0 ml LV vol s, MOD A2C: 94.8 ml LV vol s, MOD A4C: 77.0 ml LV SV MOD A2C:  42.2 ml LV SV MOD A4C:     111.0 ml LV SV MOD BP:      35.3 ml RIGHT VENTRICLE RV Basal diam:  4.90 cm RV Mid diam:    6.10 cm RV S prime:     12.70 cm/s TAPSE (M-mode): 2.5 cm LEFT ATRIUM             Index       RIGHT ATRIUM           Index LA diam:        3.60 cm 1.94 cm/m  RA Area:     13.50 cm LA Vol (A2C):   77.5 ml 41.70 ml/m RA Volume:   35.40 ml  19.05 ml/m LA Vol (A4C):   50.4 ml 27.12 ml/m LA Biplane Vol: 61.8 ml 33.25 ml/m  AORTIC VALVE AV Area (Vmax):    1.52 cm AV Area (Vmean):   1.60 cm AV Area (VTI):     1.44 cm AV Vmax:           175.00 cm/s AV Vmean:          106.000 cm/s AV VTI:            0.253 m AV Peak Grad:      12.2 mmHg AV Mean Grad:      5.0 mmHg LVOT Vmax:         84.90 cm/s LVOT Vmean:        54.100 cm/s LVOT VTI:          0.116 m LVOT/AV VTI ratio: 0.46  AORTA Ao Root diam: 3.50 cm Ao Asc diam:  3.60 cm MITRAL VALVE MV Area (PHT): 2.91 cm    SHUNTS MV Decel Time: 261 msec    Systemic VTI:  0.12 m MV E velocity: 55.30 cm/s  Systemic Diam: 2.00 cm MV A velocity: 87.40 cm/s MV E/A ratio:  0.63 Laurance Flatten MD Electronically signed by Laurance Flatten MD Signature Date/Time: 01/31/2021/3:52:46 PM    Final     Scheduled Meds: . (feeding supplement) PROSource Plus  30 mL Oral BID BM  . sodium chloride   Intravenous Once  . allopurinol  100 mg Oral Daily  . aspirin  81 mg Oral Daily  . budesonide  0.5 mg Nebulization BID  . calcium carbonate  625 mg Oral Daily  . dextromethorphan-guaiFENesin  1 tablet Oral BID  . feeding supplement  1 Container Oral TID BM  .  ipratropium-albuterol  3 mL Nebulization Q6H  . multivitamin with minerals  1 tablet Oral Daily  . niacin  250 mg Oral QHS  . [START ON 02/03/2021] pantoprazole  40 mg Intravenous Q12H  . predniSONE  10 mg Oral Q breakfast  . tamsulosin  0.4 mg Oral Daily  . vitamin B-12  1,000 mcg Oral Daily   Continuous Infusions: . pantoprozole (PROTONIX) infusion 8 mg/hr (02/02/21 0543)     LOS: 3 days   Time spent: 29 minutes   Charles Closs, MD [facility]  02/02/2021, 12:21 PM   To contact the attending provider between 7A-7P or the covering provider during after hours 7P-7A, please log into the web site [email or web address].

## 2021-02-02 NOTE — Progress Notes (Addendum)
Initial Nutrition Assessment  DOCUMENTATION CODES:  Severe malnutrition in context of chronic illness  INTERVENTION:  -d/c Glucerna shake as it is a full liquid, not a clear liquid -Boost Breeze po TID, each supplement provides 250 kcal and 9 grams of protein -33ml Prosource Plus po BID, each supplement provides 100 kcals and 15 grams of protein -Continue MVI with minerals daily  Once diet is advanced, recommend d/c Boost Breeze and ordering Ensure Enlive po TID, each supplement provides 350 kcal and 20 grams of protein in addition to Magic cup TID with meals, each supplement provides 290 kcal and 9 grams of protein  NUTRITION DIAGNOSIS:  Severe Malnutrition related to chronic illness (COPD) as evidenced by severe fat depletion,severe muscle depletion,percent weight loss.  GOAL:  Patient will meet greater than or equal to 90% of their needs  MONITOR:  PO intake,Supplement acceptance,Diet advancement,Labs,Weight trends,I & O's  REASON FOR ASSESSMENT:  Consult Assessment of nutrition requirement/status  ASSESSMENT:  Pt admitted with symptomatic/macrocytic anemia 2/2 upper GIB. PMH includes COPD on 10L O2 via HFNC at baseline, BPH, HTN, HLD, and gout.  GI evaluated pt and recommended conservative management given severity of pt's COPD. Per MD, would need anesthesia consult prior to consideration of EGD. H&H being monitored daily.   Per MD, pt also with acute on chronic CHF -- echo on 4/18 showed reduced ejection fraction of 25-30% compared to 45% in 2014 as well as grade 1 diastolic dysfunction. Cardiac enzymes slightly elevated. Pt also noted to have been c/o increased shortness of breath despite being at baseline oxygen requirement. Cardiology evaluated pt and also recommended conservative management given pt's end-stage COPD. Pt given IV Lasix.   Pt sleeping at time of RD visit and did not wake to RD voice/touch. Per H&P, pt reported lethargy and decreased appetite since 4/15. Pt has  been NPO majority of admission. Diet advanced to clear liquid on 4/18; no PO intake documented and RN unable to speak towards pt's intake over the last 2 days. Pt with current orders for Glucerna TID (ordered by MD on 4/18). Note Glucerna is a full liquid, not a clear liquid. Will discontinue this and order clear liquid supplements. Will also provide supplement recommendations for once diet is advanced.   Per wt readings, pt weighed 77.6kg on 12/01/20 and weighed 67.4 kg upon admission (01/31/21). This indicates a 13.1% weight loss x2 months, which is severe and significant for time frame.   UOP: 1.9L x24 hours  Medications: os-cal, mvi with minerals, niacin, protonix, deltasone, vitamin B12  Labs reviewed. H&H continues to be low, but was trending up yesterday. No updated lab yet today.   Hemoglobin & Hematocrit     Component Value Date/Time   HGB 8.9 (L) 02/01/2021 1619   HCT 27.7 (L) 02/01/2021 1619    NUTRITION - FOCUSED PHYSICAL EXAM:  Flowsheet Row Most Recent Value  Orbital Region Severe depletion  Upper Arm Region Severe depletion  Thoracic and Lumbar Region Severe depletion  Buccal Region Severe depletion  Temple Region Moderate depletion  Clavicle Bone Region Severe depletion  Clavicle and Acromion Bone Region Severe depletion  Scapular Bone Region Severe depletion  Dorsal Hand Moderate depletion  Patellar Region Mild depletion  Anterior Thigh Region Moderate depletion  Posterior Calf Region Moderate depletion  Edema (RD Assessment) Mild  Hair Reviewed  Eyes Unable to assess  [pt sleeping]  Mouth Unable to assess  [pt sleeping]  Skin Reviewed  Nails Reviewed      Diet Order:  Diet Order            Diet clear liquid Room service appropriate? Yes; Fluid consistency: Thin  Diet effective now                EDUCATION NEEDS:  Not appropriate for education at this time  Skin:  Skin Assessment: Reviewed RN Assessment  Last BM:  PTA  Height:  Ht Readings  from Last 1 Encounters:  12/22/20 5\' 11"  (1.803 m)   Weight:  Wt Readings from Last 1 Encounters:  02/01/21 69.3 kg   BMI:  Body mass index is 21.31 kg/m.  Estimated Nutritional Needs:  Kcal:  2100-2300 Protein:  105-115 grams Fluid:  >2L    02/03/21, MS, RD, LDN RD pager number and weekend/on-call pager number located in Amion.

## 2021-02-02 NOTE — Progress Notes (Signed)
[facility] Progress Note  Charles Archer 85 y.o. 1936/09/13  CC:  Melena, anemia  Subjective: Patient denies abdominal pain, nausea, vomiting.  Has not had a stool for the past 2-3 days.  ROS : Review of Systems  Respiratory: Positive for shortness of breath. Negative for cough.   Gastrointestinal: Negative for abdominal pain, blood in stool, constipation, diarrhea, heartburn, melena, nausea and vomiting.   Objective: Vital signs in last 24 hours: Vitals:   02/02/21 0729 02/02/21 0743  BP:  121/69  Pulse:  (!) 106  Resp:  20  Temp:  98.6 F (37 C)  SpO2: 94% 100%    Physical Exam:  General:  Chronically ill appearing, oriented, cooperative, no distress  Head:  Normocephalic, without obvious abnormality, atraumatic  Eyes:  Mild conjunctival pallor, EOMs intact  Lungs:   Tachypneic but not in respiratory distress  Heart:  Mildly tachycardic  Abdomen:   Soft, non-tender, non-distended   Extremities: Extremities normal, atraumatic, no  edema  Pulses: Radial pulse 2+     Lab Results: Recent Labs    01/31/21 0334 02/01/21 0346 02/02/21 0254  NA 140 141 140  K 5.2* 5.3* 4.1  CL 108 109 106  CO2 26 26 28   GLUCOSE 145* 117* 101*  BUN 53* 40* 28*  CREATININE 1.27* 1.07 0.99  CALCIUM 8.5* 8.1* 8.0*  MG 2.2 2.1  --   PHOS 4.8*  --   --    Recent Labs    01/20/2021 1625 01/31/21 0334  AST 41 51*  ALT 37 46*  ALKPHOS 52 61  BILITOT 0.6 0.7  PROT 4.8* 5.8*  ALBUMIN 2.5* 3.1*   Recent Labs    02/01/21 0346 02/01/21 1619  WBC 14.9* 14.6*  NEUTROABS 12.3* 12.3*  HGB 8.5* 8.9*  HCT 26.0* 27.7*  MCV 101.2* 103.0*  PLT 158 183   Recent Labs    02/10/2021 1625 01/31/21 0334  LABPROT 15.5* 14.0  INR 1.2 1.1     Assessment: Suspected upper GI bleeding: Melena, anemia.  Esophagitis versus gastritis versus PUD.  Lesion/malignancy also in the differential. -Hemoglobin 8.9 as of 4/19, stable. No further melenic stools -BUN 28, improving (elevated to  59 on arrival) -Normal PT/INR 14/1.0 as of 01/31/21 -Macrocytosis present -Normal ferritin, iron panel, folate, and elevated B12  Chronic respiratory failure with hypoxia secondary to COPD, on 10L  Plan: Upper GI series to rule out any mass lesions.  Continue IV Protonix.  If no concerning findings on Upper GI series, would continue conservative management and advance diet as tolerated.  Continue to monitor H&H with transfusion as needed to maintain Hgb >7.  02/02/21 PA-C 02/02/2021, 10:28 AM  Contact #  [phone]

## 2021-02-02 NOTE — Plan of Care (Signed)

## 2021-02-03 DIAGNOSIS — K922 Gastrointestinal hemorrhage, unspecified: Secondary | ICD-10-CM | POA: Diagnosis not present

## 2021-02-03 DIAGNOSIS — I5042 Chronic combined systolic (congestive) and diastolic (congestive) heart failure: Secondary | ICD-10-CM | POA: Diagnosis not present

## 2021-02-03 LAB — TYPE AND SCREEN
ABO/RH(D): O POS
Antibody Screen: NEGATIVE
Unit division: 0
Unit division: 0

## 2021-02-03 LAB — CBC
HCT: 26.8 % — ABNORMAL LOW (ref 39.0–52.0)
Hemoglobin: 8.6 g/dL — ABNORMAL LOW (ref 13.0–17.0)
MCH: 32.7 pg (ref 26.0–34.0)
MCHC: 32.1 g/dL (ref 30.0–36.0)
MCV: 101.9 fL — ABNORMAL HIGH (ref 80.0–100.0)
Platelets: 166 10*3/uL (ref 150–400)
RBC: 2.63 MIL/uL — ABNORMAL LOW (ref 4.22–5.81)
RDW: 16.1 % — ABNORMAL HIGH (ref 11.5–15.5)
WBC: 14.3 10*3/uL — ABNORMAL HIGH (ref 4.0–10.5)
nRBC: 0.6 % — ABNORMAL HIGH (ref 0.0–0.2)

## 2021-02-03 LAB — BPAM RBC
Blood Product Expiration Date: 202205172359
Blood Product Expiration Date: 202205192359
ISSUE DATE / TIME: 202204171930
Unit Type and Rh: 5100
Unit Type and Rh: 5100

## 2021-02-03 LAB — BASIC METABOLIC PANEL
Anion gap: 9 (ref 5–15)
BUN: 28 mg/dL — ABNORMAL HIGH (ref 8–23)
CO2: 28 mmol/L (ref 22–32)
Calcium: 8.4 mg/dL — ABNORMAL LOW (ref 8.9–10.3)
Chloride: 102 mmol/L (ref 98–111)
Creatinine, Ser: 1.07 mg/dL (ref 0.61–1.24)
GFR, Estimated: >60 mL/min (ref >60)
Glucose, Bld: 102 mg/dL — ABNORMAL HIGH (ref 70–99)
Potassium: 4.2 mmol/L (ref 3.5–5.1)
Sodium: 139 mmol/L (ref 135–145)

## 2021-02-03 MED ORDER — LEVALBUTEROL HCL 1.25 MG/0.5ML IN NEBU
1.2500 mg | INHALATION_SOLUTION | Freq: Two times a day (BID) | RESPIRATORY_TRACT | Status: DC
  Administered 2021-02-03 – 2021-02-08 (×11): 1.25 mg via RESPIRATORY_TRACT
  Filled 2021-02-03 (×12): qty 0.5

## 2021-02-03 MED ORDER — BISOPROLOL FUMARATE 5 MG PO TABS
2.5000 mg | ORAL_TABLET | Freq: Every day | ORAL | Status: DC
  Administered 2021-02-03 – 2021-02-08 (×6): 2.5 mg via ORAL
  Filled 2021-02-03 (×6): qty 1

## 2021-02-03 MED ORDER — FUROSEMIDE 10 MG/ML IJ SOLN
40.0000 mg | Freq: Once | INTRAMUSCULAR | Status: AC
  Administered 2021-02-03: 40 mg via INTRAVENOUS
  Filled 2021-02-03: qty 4

## 2021-02-03 MED ORDER — LEVALBUTEROL HCL 1.25 MG/0.5ML IN NEBU
1.2500 mg | INHALATION_SOLUTION | Freq: Four times a day (QID) | RESPIRATORY_TRACT | Status: DC | PRN
  Administered 2021-02-06 – 2021-02-07 (×3): 1.25 mg via RESPIRATORY_TRACT
  Filled 2021-02-03 (×3): qty 0.5

## 2021-02-03 MED ORDER — MIRTAZAPINE 15 MG PO TABS
15.0000 mg | ORAL_TABLET | Freq: Every day | ORAL | Status: DC
  Administered 2021-02-03 – 2021-02-07 (×5): 15 mg via ORAL
  Filled 2021-02-03 (×5): qty 1

## 2021-02-03 NOTE — Progress Notes (Signed)
PROGRESS NOTE    [name]  LNL:[id number] DOB: [birth year] DOA: 2021-02-12 PCP: Daisy Floro, MD   Brief Narrative:  Charles Archer is a 85 y.o. male with medical history significant for COPD on 10 L of oxygen via HFNC, BPH, hypertension, hyperlipidemia, gout who presented to ED due to shortness of on exertion since Friday (4/15), he endorsed urinary incontinence while on his wheelchair on Thursday (4/14),  he also noted that his stool was black.  Appetite has decreased since onset of symptoms on Friday.  Family activated EMS due to patient being lethargic and suspicion for sepsis, on arrival of EMS team, patient was reported to be in respiratory distress with wheezes and crackles, which resolved with lying supine.  He was also reported to be hypotensive, so 1 L of NS was given in route.  Patient endorsed increased cough with occasional clear phlegm and chronic mid back pain due to T3-T4  compression fracture, but denied fever, chills, chest pain, abdominal pain.  In the emergency department, he was tachycardic and tachypneic.  Work-up in the ED showed leukocytosis, macrocytic anemia, BUN was elevated at 59, CBG of 43, lactic acid 2.7, BNP 132.1, FOBT was positive.  Albumin 2.5.  Influenza A, B and SARS coronavirus 2 was negative. Chest x-ray showed severe emphysema with no active disease Breathing treatment was provided, Solu-Medrol was given. Patient was started on IV ceftriaxone due to presumed urosepsis.  Patient was started on IV Protonix drip.    Patient was admitted under hospitalist service with presumed sepsis due to UTI however patient's UA was completely unremarkable so antibiotics were discontinued next day.  He also had mild lactic acidosis due to multiple factors which resolved as well.  Also admitted for upper GI bleed/melena.  GI consulted however due to his advanced COPD and respiratory issues, they preferred conservative management and did not recommend EGD or colonoscopy.   They recommended weaning Protonix for total of 72 hours.  His hemoglobin remained stable.  patient then had slightly elevated but flat troponins and transthoracic echo showed further reduction in ejection fraction to 20 to 25% from 45% previously.  For this cardiology was consulted and they also recommended conservative management due to his advanced COPD.  His mild AKI had resolved with IV fluids.   Assessment & Plan:   Principal Problem:   Upper GI bleed Active Problems:   HTN (hypertension)   Hyperlipidemia   COPD (chronic obstructive pulmonary disease) (HCC)   Hypoxemic respiratory failure, chronic (HCC)   Physical deconditioning   Sepsis secondary to UTI (HCC)   Lactic acidosis   Acute dehydration   Hypoalbuminemia   Hyperglycemia   Leukocytosis   Macrocytic anemia   Elevated brain natriuretic peptide (BNP) level   Gout   Elevated troponin   Symptomatic anemia   Protein-calorie malnutrition, severe  Sepsis ruled out: Patient was admitted as presumed sepsis based off of tachycardia and tachypnea and possible UTI however patient's UA is completely clean with no signs of infection.  I do not think he has UTI.  His tachycardia and tachypnea was likely secondary to GI bleed and his COPD.  Antibiotics discontinued.  Lactic acidosis possibly secondary to multifactorial including above, hypoxia, dehydration.  Resolved  Upper GI bleed/melena Symptomatic anemia/Macrocytic Anemia H/H= 7.9/25.6, this was 15.8/46.3 (about 7 years ago; most recent lab for comparison) Hemoccult was positive.  He received 1 unit of PRBC transfusion.  Hemoglobin improved to 10.  Seen by GI.  Due to his  severe COPD, they recommend conservative management and recommend continuing Protonix drip for at least 3 days and then oral PPI twice daily unless he has severe bleeding.  Patient was switched to oral Protonix on 02/02/2021.  They could consider doing EGD but he would need anesthesia consult.  Hemoglobin has  remained stable.  Patient underwent upper GI series on 02/02/2021 which is unremarkable.  Chronic respiratory failure with hypoxia secondary to COPD: Reportedly, patient uses 10 L of high flow oxygen at home.  Currently he is at his baseline oxygen requirement however today he feels more short of breath.  BNP checked that is elevated.  Likely has some component of acute on chronic combined congestive heart failure.  Gave him Lasix yesterday and day before.  He feels better.  Still has crackles.  We will get another dose of IV Lasix today.   Continue duo nebs, Mucinex, Solu-Medrol, albuterol, Pulmicort Continue incentive spirometry and flutter valve Continue supplemental oxygen to maintain O2 sat > 92%   Acute on chronic combined systolic and diastolic congestive heart failure: Echo on 02/01/2019 shows reduced ejection fraction of 25 to 30% compared to 45% in 2014 as well as grade 1 diastolic dysfunction.  Has slightly elevated cardiac enzymes but flat. BNP 132 and now 333 on 02/01/2021. Cardiology on board and they recommend pursuing conservative management due to his end-stage COPD.  Gave him Lasix yesterday and day before.  Still has crackles.  Another dose of IV Lasix ordered today.  Will reassess tomorrow for possibly switching to oral Lasix.  Appreciate cardiology help.  Atrial tachycardia: Patient had episode of atrial tachycardia with a heart rate around 150 on the morning of 02/03/2021 however patient was asymptomatic and electrolytes were normal as well.  Seen by cardiology today and started on bisoprolol.  Hyperkalemia: Resolved  Mild AKI: Resolved.  Stop IV fluids.  Dehydration: Resolved.    Leukocytosis/Hyperglycemia possibly related to prednisone effect WBC 11.7, CBG 143.  Patient is chronically on prednisone at home Continue to monitor WBC.  Mild depression: Talking to the daughter, she mentions that patient has lately been very depressed.  He is not interested in eating.  She  requesting to place him Remeron.  I have ordered that.  Hypoalbuminemia possibly secondary to moderate protein calorie malnutrition.  Consult dietitian.  Hypertension: Blood pressure stable.  Avapro on hold due to recent AKI.  Continue to hold.  Hyperlipidemia Continue Niacin  Chronic back pain Continue Tylenol as needed  BPH Continue Flomax  Gout: Continue allopurinol  DVT prophylaxis: SCDs Start: Feb 25, 2021 2010   Code Status: Full Code  Family Communication: None present at bedside.  Updated daughter [name] over the phone.  Status is: Inpatient  Remains inpatient appropriate because:Inpatient level of care appropriate due to severity of illness   Dispo: The patient is from: Home              Anticipated d/c is to: Home              Patient currently is not medically stable to d/c.   Difficult to place patient No    Estimated body mass index is 21.31 kg/m as calculated from the following:   Height as of 12/22/20: 5\' 11"  (1.803 m).   Weight as of this encounter: 69.3 kg.      Nutritional status:  Nutrition Problem: Severe Malnutrition Etiology: chronic illness (COPD)   Signs/Symptoms: severe fat depletion,severe muscle depletion,percent weight loss Percent weight loss: 13.1 %   Interventions: Refer  to RD note for recommendations,Boost Breeze,MVI,Prostat    Consultants:   GI  Procedures:   None  Antimicrobials:  Anti-infectives (From admission, onward)   Start     Dose/Rate Route Frequency Ordered Stop   01/31/21 0800  cefTRIAXone (ROCEPHIN) 1 g in sodium chloride 0.9 % 100 mL IVPB  Status:  Discontinued        1 g 200 mL/hr over 30 Minutes Intravenous Every 24 hours 23-Feb-2021 2049 01/31/21 1023   02-23-2021 1645  cefTRIAXone (ROCEPHIN) 1 g in sodium chloride 0.9 % 100 mL IVPB        1 g 200 mL/hr over 30 Minutes Intravenous  Once Feb 23, 2021 1644 02/23/2021 1745         Subjective: Seen and examined.  Feels well.  No shortness of  breath.  Objective: Vitals:   02/03/21 0500 02/03/21 0725 02/03/21 0800 02/03/21 0843  BP:  (!) 102/58 119/71   Pulse:  89 (!) 113   Resp:  (!) 22 19   Temp:  98.7 F (37.1 C) 98 F (36.7 C)   TempSrc:  Oral    SpO2:  98% 100% 100%  Weight: 69.3 kg       Intake/Output Summary (Last 24 hours) at 02/03/2021 1105 Last data filed at 02/03/2021 0600 Gross per 24 hour  Intake 643.72 ml  Output 2500 ml  Net -1856.28 ml   Filed Weights   02/01/21 0500 02/02/21 0500 02/03/21 0500  Weight: 69.3 kg 68.4 kg 69.3 kg    Examination: General exam: Appears calm and comfortable  Respiratory system: Bibasilar crackles. Respiratory effort normal. Cardiovascular system: S1 & S2 heard, RRR. No JVD, murmurs, rubs, gallops or clicks. No pedal edema. Gastrointestinal system: Abdomen is nondistended, soft and nontender. No organomegaly or masses felt. Normal bowel sounds heard. Central nervous system: Alert and oriented. No focal neurological deficits. Extremities: Symmetric 5 x 5 power. Skin: No rashes, lesions or ulcers.  Psychiatry: Judgement and insight appear normal. Mood & affect appropriate.   Data Reviewed: I have personally reviewed following labs and imaging studies  CBC: Recent Labs  Lab 01/31/21 0842 01/31/21 1811 02/01/21 0346 02/01/21 1619 02/02/21 1241 02/02/21 2238 02/03/21 0220  WBC 12.2* 12.1* 14.9* 14.6* 11.7* 12.4* 14.3*  NEUTROABS 10.2* 9.8* 12.3* 12.3*  --  9.5*  --   HGB 10.4* 9.8* 8.5* 8.9* 8.1* 8.5* 8.6*  HCT 31.1* 30.2* 26.0* 27.7* 25.0* 26.0* 26.8*  MCV 100.6* 100.7* 101.2* 103.0* 101.6* 100.8* 101.9*  PLT 168 177 158 183 148* 166 166   Basic Metabolic Panel: Recent Labs  Lab 01/31/21 0334 02/01/21 0346 02/02/21 0254 02/02/21 2238 02/03/21 0220  NA 140 141 140 138 139  K 5.2* 5.3* 4.1 4.1 4.2  CL 108 109 106 98 102  CO2 26 26 28 30 28   GLUCOSE 145* 117* 101* 97 102*  BUN 53* 40* 28* 27* 28*  CREATININE 1.27* 1.07 0.99 1.08 1.07  CALCIUM 8.5*  8.1* 8.0* 8.2* 8.4*  MG 2.2 2.1  --  2.0  --   PHOS 4.8*  --   --  2.1*  --    GFR: Estimated Creatinine Clearance: 51.3 mL/min (by C-G formula based on SCr of 1.07 mg/dL). Liver Function Tests: Recent Labs  Lab 02/23/21 1625 01/31/21 0334 02/02/21 2238  AST 41 51*  --   ALT 37 46*  --   ALKPHOS 52 61  --   BILITOT 0.6 0.7  --   PROT 4.8* 5.8*  --   ALBUMIN  2.5* 3.1* 2.6*   No results for input(s): LIPASE, AMYLASE in the last 168 hours. No results for input(s): AMMONIA in the last 168 hours. Coagulation Profile: Recent Labs  Lab 01/23/2021 1625 01/31/21 0334  INR 1.2 1.1   Cardiac Enzymes: No results for input(s): CKTOTAL, CKMB, CKMBINDEX, TROPONINI in the last 168 hours. BNP (last 3 results) No results for input(s): PROBNP in the last 8760 hours. HbA1C: No results for input(s): HGBA1C in the last 72 hours. CBG: No results for input(s): GLUCAP in the last 168 hours. Lipid Profile: No results for input(s): CHOL, HDL, LDLCALC, TRIG, CHOLHDL, LDLDIRECT in the last 72 hours. Thyroid Function Tests: No results for input(s): TSH, T4TOTAL, FREET4, T3FREE, THYROIDAB in the last 72 hours. Anemia Panel: No results for input(s): VITAMINB12, FOLATE, FERRITIN, TIBC, IRON, RETICCTPCT in the last 72 hours. Sepsis Labs: Recent Labs  Lab 01/29/2021 1625 02/12/2021 1825 01/31/21 0334 01/31/21 0842  PROCALCITON  --   --   --  0.14  LATICACIDVEN 2.7* 2.4* 1.8  --     Recent Results (from the past 240 hour(s))  Resp Panel by RT-PCR (Flu A&B, Covid) Nasopharyngeal Swab     Status: None   Collection Time: 01/27/2021  4:25 PM   Specimen: Nasopharyngeal Swab; Nasopharyngeal(NP) swabs in vial transport medium  Result Value Ref Range Status   SARS Coronavirus 2 by RT PCR NEGATIVE NEGATIVE Final    Comment: (NOTE) SARS-CoV-2 target nucleic acids are NOT DETECTED.  The SARS-CoV-2 RNA is generally detectable in upper respiratory specimens during the acute phase of infection. The  lowest concentration of SARS-CoV-2 viral copies this assay can detect is 138 copies/mL. A negative result does not preclude SARS-Cov-2 infection and should not be used as the sole basis for treatment or other patient management decisions. A negative result may occur with  improper specimen collection/handling, submission of specimen other than nasopharyngeal swab, presence of viral mutation(s) within the areas targeted by this assay, and inadequate number of viral copies(<138 copies/mL). A negative result must be combined with clinical observations, patient history, and epidemiological information. The expected result is Negative.  Fact Sheet for Patients:  BloggerCourse.comhttps://www.fda.gov/media/152166/download  Fact Sheet for Healthcare Providers:  SeriousBroker.ithttps://www.fda.gov/media/152162/download  This test is no t yet approved or cleared by the [place] FDA and  has been authorized for detection and/or diagnosis of SARS-CoV-2 by FDA under an Emergency Use Authorization (EUA). This EUA will remain  in effect (meaning this test can be used) for the duration of the COVID-19 declaration under Section 564(b)(1) of the Act, 21 U.S.C.section 360bbb-3(b)(1), unless the authorization is terminated  or revoked sooner.       Influenza A by PCR NEGATIVE NEGATIVE Final   Influenza B by PCR NEGATIVE NEGATIVE Final    Comment: (NOTE) The Xpert Xpress SARS-CoV-2/FLU/RSV plus assay is intended as an aid in the diagnosis of influenza from Nasopharyngeal swab specimens and should not be used as a sole basis for treatment. Nasal washings and aspirates are unacceptable for Xpert Xpress SARS-CoV-2/FLU/RSV testing.  Fact Sheet for Patients: BloggerCourse.comhttps://www.fda.gov/media/152166/download  Fact Sheet for Healthcare Providers: SeriousBroker.ithttps://www.fda.gov/media/152162/download  This test is not yet approved or cleared by the [place] FDA and has been authorized for detection and/or diagnosis of SARS-CoV-2 by FDA under  an Emergency Use Authorization (EUA). This EUA will remain in effect (meaning this test can be used) for the duration of the COVID-19 declaration under Section 564(b)(1) of the Act, 21 U.S.C. section 360bbb-3(b)(1), unless the authorization is terminated or revoked.  Performed at [facility], [address], [place], [place] [address]   Blood Culture (routine x 2)     Status: None (Preliminary result)   Collection Time: 02/10/2021  4:25 PM   Specimen: BLOOD  Result Value Ref Range Status   Specimen Description BLOOD LEFT ANTECUBITAL  Final   Special Requests   Final    BOTTLES DRAWN AEROBIC AND ANAEROBIC Blood Culture results may not be optimal due to an inadequate volume of blood received in culture bottles   Culture   Final    NO GROWTH 4 DAYS Performed at [facility], [address] [place], [place] [address]    Report Status PENDING  Incomplete  Blood Culture (routine x 2)     Status: None (Preliminary result)   Collection Time: 01/29/2021  4:30 PM   Specimen: BLOOD  Result Value Ref Range Status   Specimen Description BLOOD RIGHT ANTECUBITAL  Final   Special Requests   Final    BOTTLES DRAWN AEROBIC AND ANAEROBIC Blood Culture results may not be optimal due to an inadequate volume of blood received in culture bottles   Culture   Final    NO GROWTH 4 DAYS Performed at [facility], [address], [place], [place] [address]    Report Status PENDING  Incomplete      Radiology Studies: DG CHEST PORT 1 VIEW  Result Date: 02/01/2021 CLINICAL DATA:  Shortness of breath. EXAM: PORTABLE CHEST 1 VIEW COMPARISON:  Chest x-ray dated January 30, 2021. FINDINGS: The heart size and mediastinal contours are within normal limits. The lungs remain hyperinflated with severe emphysematous changes. Scattered pleuroparenchymal scarring, also involving both costophrenic angles. No focal consolidation, pleural effusion, or pneumothorax. No acute osseous abnormality. IMPRESSION: 1.  COPD. No active disease. Electronically Signed   By: Obie Dredge M.D.   On: 02/01/2021 16:42   DG UGI W SINGLE CM (SOL OR THIN BA)  Result Date: 02/02/2021 CLINICAL DATA:  GI bleed. EXAM: UPPER GI SERIES WITH KUB TECHNIQUE: After obtaining a scout radiograph a routine upper GI series was performed using thin barium FLUOROSCOPY TIME:  Fluoroscopy Time:  1 minutes 30 second Radiation Exposure Index (if provided by the fluoroscopic device): Number of Acquired Spot Images: 15 COMPARISON:  None. FINDINGS: Preliminary KUB demonstrates normal bowel gas pattern. The patient drank an adequate amount of thin barium through a straw. Normal esophageal function. No esophageal stricture or mass. No hiatal hernia. Limited mobility. The stomach is evaluated in the right lateral view and both oblique views. Stomach normal in volume. Normal gastric mucosa. No ulcer or mass in the stomach. Normal gastric emptying. Duodenal bulb normal. No ulcer. Normal duodenal C loop. IMPRESSION: Negative upper GI.  No ulcer or mass identified. Electronically Signed   By: Marlan Palau M.D.   On: 02/02/2021 13:43    Scheduled Meds: . (feeding supplement) PROSource Plus  30 mL Oral BID BM  . sodium chloride   Intravenous Once  . allopurinol  100 mg Oral Daily  . aspirin  81 mg Oral Daily  . bisoprolol  2.5 mg Oral Daily  . budesonide  0.5 mg Nebulization BID  . calcium carbonate  625 mg Oral Daily  . dextromethorphan-guaiFENesin  1 tablet Oral BID  . feeding supplement  1 Container Oral TID BM  . levalbuterol  1.25 mg Nebulization BID  . multivitamin with minerals  1 tablet Oral Daily  . niacin  250 mg Oral QHS  .  pantoprazole  40 mg Oral BID  . predniSONE  10 mg Oral Q breakfast  . tamsulosin  0.4 mg Oral Daily  . vitamin B-12  1,000 mcg Oral Daily   Continuous Infusions:    LOS: 4 days   Time spent: 31 minutes   Hughie Closs, MD [facility]  02/03/2021, 11:05 AM   To contact the attending provider  between 7A-7P or the covering provider during after hours 7P-7A, please log into the web site [email or web address].

## 2021-02-03 NOTE — Progress Notes (Signed)
Progress Note  Patient Name: Charles Archer Date of Encounter: 02/03/2021  Primary Cardiologist: No primary care provider on file.   Subjective   Feels slightly better with IV diuresis, has had good output.   Inpatient Medications    Scheduled Meds: . (feeding supplement) PROSource Plus  30 mL Oral BID BM  . sodium chloride   Intravenous Once  . allopurinol  100 mg Oral Daily  . aspirin  81 mg Oral Daily  . budesonide  0.5 mg Nebulization BID  . calcium carbonate  625 mg Oral Daily  . dextromethorphan-guaiFENesin  1 tablet Oral BID  . feeding supplement  1 Container Oral TID BM  . levalbuterol  1.25 mg Nebulization BID  . multivitamin with minerals  1 tablet Oral Daily  . niacin  250 mg Oral QHS  . pantoprazole  40 mg Oral BID  . predniSONE  10 mg Oral Q breakfast  . tamsulosin  0.4 mg Oral Daily  . vitamin B-12  1,000 mcg Oral Daily   Continuous Infusions:  PRN Meds: acetaminophen, albuterol, docusate sodium, levalbuterol   Vital Signs    Vitals:   02/03/21 0400 02/03/21 0500 02/03/21 0725 02/03/21 0800  BP: (!) 120/50  (!) 102/58 119/71  Pulse:   89 (!) 113  Resp: 16  (!) 22 19  Temp:   98.7 F (37.1 C) 98 F (36.7 C)  TempSrc:   Oral   SpO2:   98% 100%  Weight:  69.3 kg      Intake/Output Summary (Last 24 hours) at 02/03/2021 0809 Last data filed at 02/03/2021 0600 Gross per 24 hour  Intake 643.72 ml  Output 3200 ml  Net -2556.28 ml   Filed Weights   02/01/21 0500 02/02/21 0500 02/03/21 0500  Weight: 69.3 kg 68.4 kg 69.3 kg    Telemetry    SR with atrial tachycardia, PACs and PVCs. No definite atrial fibrillation - Personally Reviewed  ECG    Not available for review- Personally Reviewed  Physical Exam   GEN: increased work of breathing. Neck: No JVD Cardiac: irregular rhythm, tachycardic rate, no murmurs, rubs, or gallops.  Respiratory: diminished in lateral bases GI: Soft, nontender, non-distended  MS: mild pitting ankle edema; No  deformity. Neuro:  Nonfocal  Psych: Normal affect   Labs    Chemistry Recent Labs  Lab 01/16/2021 1625 01/15/2021 1729 01/31/21 0334 02/01/21 0346 02/02/21 0254 02/02/21 2238 02/03/21 0220  NA 139   < > 140   < > 140 138 139  K 5.1   < > 5.2*   < > 4.1 4.1 4.2  CL 107  --  108   < > 106 98 102  CO2 25  --  26   < > 28 30 28   GLUCOSE 143*  --  145*   < > 101* 97 102*  BUN 59*  --  53*   < > 28* 27* 28*  CREATININE 1.17  --  1.27*   < > 0.99 1.08 1.07  CALCIUM 7.7*  --  8.5*   < > 8.0* 8.2* 8.4*  PROT 4.8*  --  5.8*  --   --   --   --   ALBUMIN 2.5*  --  3.1*  --   --  2.6*  --   AST 41  --  51*  --   --   --   --   ALT 37  --  46*  --   --   --   --  ALKPHOS 52  --  61  --   --   --   --   BILITOT 0.6  --  0.7  --   --   --   --   GFRNONAA >60  --  56*   < > >60 >60 >60  ANIONGAP 7  --  6   < > 6 10 9    < > = values in this interval not displayed.     Hematology Recent Labs  Lab 02/02/21 1241 02/02/21 2238 02/03/21 0220  WBC 11.7* 12.4* 14.3*  RBC 2.46* 2.58* 2.63*  HGB 8.1* 8.5* 8.6*  HCT 25.0* 26.0* 26.8*  MCV 101.6* 100.8* 101.9*  MCH 32.9 32.9 32.7  MCHC 32.4 32.7 32.1  RDW 16.3* 16.1* 16.1*  PLT 148* 166 166    Cardiac EnzymesNo results for input(s): TROPONINI in the last 168 hours. No results for input(s): TROPIPOC in the last 168 hours.   BNP Recent Labs  Lab 02/13/21 1626 02/01/21 0346  BNP 132.1* 333.0*     DDimer No results for input(s): DDIMER in the last 168 hours.   Radiology    DG CHEST PORT 1 VIEW  Result Date: 02/01/2021 CLINICAL DATA:  Shortness of breath. EXAM: PORTABLE CHEST 1 VIEW COMPARISON:  Chest x-ray dated 2021/02/13. FINDINGS: The heart size and mediastinal contours are within normal limits. The lungs remain hyperinflated with severe emphysematous changes. Scattered pleuroparenchymal scarring, also involving both costophrenic angles. No focal consolidation, pleural effusion, or pneumothorax. No acute osseous abnormality.  IMPRESSION: 1. COPD. No active disease. Electronically Signed   By: February 01, 2021 M.D.   On: 02/01/2021 16:42   DG UGI W SINGLE CM (SOL OR THIN BA)  Result Date: 02/02/2021 CLINICAL DATA:  GI bleed. EXAM: UPPER GI SERIES WITH KUB TECHNIQUE: After obtaining a scout radiograph a routine upper GI series was performed using thin barium FLUOROSCOPY TIME:  Fluoroscopy Time:  1 minutes 30 second Radiation Exposure Index (if provided by the fluoroscopic device): Number of Acquired Spot Images: 15 COMPARISON:  None. FINDINGS: Preliminary KUB demonstrates normal bowel gas pattern. The patient drank an adequate amount of thin barium through a straw. Normal esophageal function. No esophageal stricture or mass. No hiatal hernia. Limited mobility. The stomach is evaluated in the right lateral view and both oblique views. Stomach normal in volume. Normal gastric mucosa. No ulcer or mass in the stomach. Normal gastric emptying. Duodenal bulb normal. No ulcer. Normal duodenal C loop. IMPRESSION: Negative upper GI.  No ulcer or mass identified. Electronically Signed   By: 02/04/2021 M.D.   On: 02/02/2021 13:43    Cardiac Studies    IMPRESSIONS     1. Left ventricular ejection fraction, by estimation, is 25 to 30%. The  left ventricle has severely decreased function. Wall motion very difficult  to assess due to significant ectopy and poor endocardial visualization,  however, the inferior wall appears   more hypokinetic than the other LV segments. Left ventricular diastolic  parameters are consistent with Grade I diastolic dysfunction (impaired  relaxation).   2. Right ventricular systolic function is normal. The right ventricular  size is severely enlarged.   3. The mitral valve is grossly normal. Trivial mitral valve  regurgitation.   4. The aortic valve is tricuspid. There is moderate calcification of the  aortic valve. There is moderate thickening of the aortic valve. Aortic  valve regurgitation is  mild. Mild to moderate aortic valve  sclerosis/calcification is present, without any  evidence of aortic stenosis.   5. Aortic dilatation noted. There is mild dilatation of the ascending  aorta, measuring 36 mm.    Patient Profile      Charles Archer is a 85 y.o. male with a hx of HTN, HLD, oxygen dependent COPD (10L) and CKD who is being seen today for the evaluation of CHF at the request of Dr. Jacqulyn Bath.    Admitted 10/2013 for dyspnea on exertion. Myoview study demonstrates a fixed inferior wall defect. Echo shows an EF of 45% with global hypokinesis. Cath showed non obstructive (10-20% diffuse LAD; 20-30% RCA) CAD. Now with EF in 25-30% range.    Assessment & Plan   Principal Problem:   Upper GI bleed Active Problems:   HTN (hypertension)   Hyperlipidemia   COPD (chronic obstructive pulmonary disease) (HCC)   Hypoxemic respiratory failure, chronic (HCC)   Physical deconditioning   Sepsis secondary to UTI (HCC)   Lactic acidosis   Acute dehydration   Hypoalbuminemia   Hyperglycemia   Leukocytosis   Macrocytic anemia   Elevated brain natriuretic peptide (BNP) level   Gout   Elevated troponin   Symptomatic anemia   Protein-calorie malnutrition, severe   1. Chronic combined CHF 2. Dyspnea Echocardiogram showed newly depressed LV function to 25% and grade 1 diastolic dysfunction.   - lasix IV was trialed with good response. Can give one additional dose today and monitor response. Consider po lasix 40 mg daily starting tomorrow.  - will add bisoprolol 2.5 mg daily for ectopy and tachycardia, I think this is driven by COPD and anemia more so than decompensated HF. Patient counseled on risks with BB in setting of severe COPD. Will attempt to use but will stop for wheezing or worsening respiratory status.  3.  Elevated troponin -Low flat.  Not an ACS pattern.  He denies chest pain. -Likely demand in setting of acute GI bleed and sepsis appearance   4.  Upper GI  bleed/anemia - Hgb remains stable though low. This will make management of HF, tachycardia and respiratory symptoms much more complicated.   5.  Acute on chronic hypoxic respiratory failure secondary to COPD -Management by primary team    6. AKI - Scr 1.07 -Patient got 1 L of normal saline by EMS and then maintenace fluids, followed but 2 days of IV diuresis -Follow closely   7.  Tachycardia -Review of telemetry shows sinus tachycardia at heart rate of 120 bpm with frequent ectopic PAC and PVC. -Likely driven by pulmonary disease, anemia.  -Will add bisoprolol -No documented evidence of atrial fibrillation on monitor       For questions or updates, please contact [facility] Please consult [email or web address] for contact info under        Signed, Parke Poisson, MD  02/03/2021, 8:09 AM

## 2021-02-04 DIAGNOSIS — Z66 Do not resuscitate: Secondary | ICD-10-CM | POA: Diagnosis not present

## 2021-02-04 DIAGNOSIS — Z7189 Other specified counseling: Secondary | ICD-10-CM

## 2021-02-04 DIAGNOSIS — Z515 Encounter for palliative care: Secondary | ICD-10-CM | POA: Diagnosis not present

## 2021-02-04 DIAGNOSIS — I5042 Chronic combined systolic (congestive) and diastolic (congestive) heart failure: Secondary | ICD-10-CM | POA: Diagnosis not present

## 2021-02-04 DIAGNOSIS — K922 Gastrointestinal hemorrhage, unspecified: Secondary | ICD-10-CM | POA: Diagnosis not present

## 2021-02-04 LAB — CBC
HCT: 27.6 % — ABNORMAL LOW (ref 39.0–52.0)
Hemoglobin: 8.8 g/dL — ABNORMAL LOW (ref 13.0–17.0)
MCH: 32.8 pg (ref 26.0–34.0)
MCHC: 31.9 g/dL (ref 30.0–36.0)
MCV: 103 fL — ABNORMAL HIGH (ref 80.0–100.0)
Platelets: 200 10*3/uL (ref 150–400)
RBC: 2.68 MIL/uL — ABNORMAL LOW (ref 4.22–5.81)
RDW: 16 % — ABNORMAL HIGH (ref 11.5–15.5)
WBC: 12.7 10*3/uL — ABNORMAL HIGH (ref 4.0–10.5)
nRBC: 0.4 % — ABNORMAL HIGH (ref 0.0–0.2)

## 2021-02-04 LAB — BASIC METABOLIC PANEL
Anion gap: 10 (ref 5–15)
BUN: 44 mg/dL — ABNORMAL HIGH (ref 8–23)
CO2: 30 mmol/L (ref 22–32)
Calcium: 8.4 mg/dL — ABNORMAL LOW (ref 8.9–10.3)
Chloride: 99 mmol/L (ref 98–111)
Creatinine, Ser: 1.17 mg/dL (ref 0.61–1.24)
GFR, Estimated: >60 mL/min (ref >60)
Glucose, Bld: 106 mg/dL — ABNORMAL HIGH (ref 70–99)
Potassium: 3.8 mmol/L (ref 3.5–5.1)
Sodium: 139 mmol/L (ref 135–145)

## 2021-02-04 LAB — CULTURE, BLOOD (ROUTINE X 2)
Culture: NO GROWTH
Culture: NO GROWTH

## 2021-02-04 MED ORDER — ENSURE ENLIVE PO LIQD
237.0000 mL | Freq: Three times a day (TID) | ORAL | Status: DC
  Administered 2021-02-04 – 2021-02-08 (×10): 237 mL via ORAL
  Filled 2021-02-04 (×3): qty 237

## 2021-02-04 MED ORDER — FUROSEMIDE 40 MG PO TABS
40.0000 mg | ORAL_TABLET | Freq: Every day | ORAL | Status: DC
  Administered 2021-02-04 – 2021-02-05 (×2): 40 mg via ORAL
  Filled 2021-02-04 (×2): qty 1

## 2021-02-04 NOTE — Progress Notes (Addendum)
Progress Note  Patient Name: Charles Archer Date of Encounter: 02/04/2021  [facility] Cardiologist: Parke Poisson, MD   Subjective   Laying flat but still requiring 9 L of oxygen on nasal cannula.  No chest pain.  Inpatient Medications    Scheduled Meds: . (feeding supplement) PROSource Plus  30 mL Oral BID BM  . sodium chloride   Intravenous Once  . allopurinol  100 mg Oral Daily  . aspirin  81 mg Oral Daily  . bisoprolol  2.5 mg Oral Daily  . budesonide  0.5 mg Nebulization BID  . calcium carbonate  625 mg Oral Daily  . dextromethorphan-guaiFENesin  1 tablet Oral BID  . feeding supplement  237 mL Oral TID BM  . levalbuterol  1.25 mg Nebulization BID  . mirtazapine  15 mg Oral QHS  . multivitamin with minerals  1 tablet Oral Daily  . niacin  250 mg Oral QHS  . pantoprazole  40 mg Oral BID  . predniSONE  10 mg Oral Q breakfast  . tamsulosin  0.4 mg Oral Daily  . vitamin B-12  1,000 mcg Oral Daily   Continuous Infusions:  PRN Meds: acetaminophen, albuterol, docusate sodium, levalbuterol   Vital Signs    Vitals:   02/03/21 2311 02/04/21 0303 02/04/21 0737 02/04/21 0827  BP: (!) 95/41 (!) 107/52 (!) 109/55   Pulse: 91 98 97   Resp: 18 (!) 26 (!) 36   Temp: 98.5 F (36.9 C) 98 F (36.7 C) 98.7 F (37.1 C)   TempSrc: Oral Oral Oral   SpO2: 93% 94% 98% 97%  Weight:      Height:        Intake/Output Summary (Last 24 hours) at 02/04/2021 1234 Last data filed at 02/03/2021 1611 Gross per 24 hour  Intake --  Output 700 ml  Net -700 ml   Last 3 Weights 02/03/2021 02/03/2021 02/02/2021  Weight (lbs) 152 lb 12.5 oz 152 lb 12.5 oz 150 lb 12.7 oz  Weight (kg) 69.3 kg 69.3 kg 68.4 kg      Telemetry    Sinus rhythm to tachycardia, PAC, PVC- Personally Reviewed  ECG    No new tracing  Physical Exam   GEN: No acute distress.   Neck: No JVD Cardiac:  Irregular, no murmurs, rubs, or gallops.  Respiratory:  Diminished breath sound GI: Soft, nontender,  non-distended  MS:  ankle edema; No deformity. Neuro:  Nonfocal  Psych: Normal affect   Labs    High Sensitivity Troponin:   Recent Labs  Lab 01/22/2021 1625 02/01/2021 1826 01/31/21 0334  TROPONINIHS 36* 37* 60*      Chemistry Recent Labs  Lab 01/21/2021 1625 01/20/2021 1729 01/31/21 0334 02/01/21 0346 02/02/21 2238 02/03/21 0220 02/04/21 0312  NA 139   < > 140   < > 138 139 139  K 5.1   < > 5.2*   < > 4.1 4.2 3.8  CL 107  --  108   < > 98 102 99  CO2 25  --  26   < > 30 28 30   GLUCOSE 143*  --  145*   < > 97 102* 106*  BUN 59*  --  53*   < > 27* 28* 44*  CREATININE 1.17  --  1.27*   < > 1.08 1.07 1.17  CALCIUM 7.7*  --  8.5*   < > 8.2* 8.4* 8.4*  PROT 4.8*  --  5.8*  --   --   --   --  ALBUMIN 2.5*  --  3.1*  --  2.6*  --   --   AST 41  --  51*  --   --   --   --   ALT 37  --  46*  --   --   --   --   ALKPHOS 52  --  61  --   --   --   --   BILITOT 0.6  --  0.7  --   --   --   --   GFRNONAA >60  --  56*   < > >60 >60 >60  ANIONGAP 7  --  6   < > 10 9 10    < > = values in this interval not displayed.     Hematology Recent Labs  Lab 02/02/21 2238 02/03/21 0220 02/04/21 0312  WBC 12.4* 14.3* 12.7*  RBC 2.58* 2.63* 2.68*  HGB 8.5* 8.6* 8.8*  HCT 26.0* 26.8* 27.6*  MCV 100.8* 101.9* 103.0*  MCH 32.9 32.7 32.8  MCHC 32.7 32.1 31.9  RDW 16.1* 16.1* 16.0*  PLT 166 166 200    BNP Recent Labs  Lab 02/22/21 1626 02/01/21 0346  BNP 132.1* 333.0*     Radiology    DG UGI W SINGLE CM (SOL OR THIN BA)  Result Date: 02/02/2021 CLINICAL DATA:  GI bleed. EXAM: UPPER GI SERIES WITH KUB TECHNIQUE: After obtaining a scout radiograph a routine upper GI series was performed using thin barium FLUOROSCOPY TIME:  Fluoroscopy Time:  1 minutes 30 second Radiation Exposure Index (if provided by the fluoroscopic device): Number of Acquired Spot Images: 15 COMPARISON:  None. FINDINGS: Preliminary KUB demonstrates normal bowel gas pattern. The patient drank an adequate amount of  thin barium through a straw. Normal esophageal function. No esophageal stricture or mass. No hiatal hernia. Limited mobility. The stomach is evaluated in the right lateral view and both oblique views. Stomach normal in volume. Normal gastric mucosa. No ulcer or mass in the stomach. Normal gastric emptying. Duodenal bulb normal. No ulcer. Normal duodenal C loop. IMPRESSION: Negative upper GI.  No ulcer or mass identified. Electronically Signed   By: 02/04/2021 M.D.   On: 02/02/2021 13:43    Cardiac Studies   Echo this admission IMPRESSIONS  1. Left ventricular ejection fraction, by estimation, is 25 to 30%. The  left ventricle has severely decreased function. Wall motion very difficult  to assess due to significant ectopy and poor endocardial visualization,  however, the inferior wall appears  more hypokinetic than the other LV segments. Left ventricular diastolic  parameters are consistent with Grade I diastolic dysfunction (impaired  relaxation).  2. Right ventricular systolic function is normal. The right ventricular  size is severely enlarged.  3. The mitral valve is grossly normal. Trivial mitral valve  regurgitation.  4. The aortic valve is tricuspid. There is moderate calcification of the  aortic valve. There is moderate thickening of the aortic valve. Aortic  valve regurgitation is mild. Mild to moderate aortic valve  sclerosis/calcification is present, without any  evidence of aortic stenosis.  5. Aortic dilatation noted. There is mild dilatation of the ascending  aorta, measuring 36 mm.    Patient Profile     Charles Dilorenzo Nicksis a 85 y.o.malewith a hx of HTN, HLD, oxygen dependent COPD (10L) and CKDwho is being seen  for the evaluation of CHFat the request of Dr. 91.   Admitted 10/2013 for dyspnea on exertion.Myoview study demonstrates a fixed inferior  wall defect. Echo shows an EF of 45% with global hypokinesis.Cath showed non obstructive (10-20% diffuse  LAD; 20-30% RCA) CAD.Now with EF in 25-30% range.   Assessment & Plan    1. Chronic combined CHF 2. Dyspnea - Echocardiogram this admission showed newly depressed LV function to 25% and grade 1 diastolic dysfunction.  Initially hydrated then placed on IV Lasix.  Net I&O -4.8 L.  -700 cc in past 24 hours.  -Patient is requiring 9 L of oxygen.  His oxygen requirement seems out of proportion for CHF.  Likely pulmonary driven.  Not significant volume overload on exam.  He is laying flat.  Consider palliative evaluation. -As recommended we start on p.o. Lasix 40 mg daily for maintenance. -Continue bisoprolol -Soft blood pressure precludes addition of afterload medications   3. Tachycardia -Review of telemetry shows sinus tachycardia at heart rate of 120 bpm with frequent ectopic PAC and PVC. -Felt driven by pulmonary disease and  anemia.  -HR now improved after addition of bisoprolol -No documented evidence of atrial fibrillation on monitor    For questions or updates, please contact [facility] Please consult [email or web address] for contact info under        Lorelei Pont, PA  02/04/2021, 12:34 PM    Patient seen and examined with [facility].  Agree as above, with the following exceptions and changes as noted below. Dyspnea overall unchanged. Gen: NAD, CV: tachycardic and irregular, no murmurs, Lungs: clear, Abd: soft, Extrem: Warm, well perfused, no edema, Neuro/Psych: alert and oriented x 3, normal mood and affect. All available labs, radiology testing, previous records reviewed. HR improved with addition of bisoprolol, no worsening of respiratory symptoms. Continue lasix po 40 mg daily and monitor renal function.   Parke Poisson, MD

## 2021-02-04 NOTE — Progress Notes (Signed)
PROGRESS NOTE    Charles Archer  ZOX:[id number] DOB: 1936-04-14 DOA: 2021-02-28 PCP: Daisy Floro, MD   Brief Narrative:  Charles Archer is a 85 y.o. male with medical history significant for COPD on 10 L of oxygen via HFNC, BPH, hypertension, hyperlipidemia, gout who presented to ED due to shortness of on exertion since Friday (4/15), he endorsed urinary incontinence while on his wheelchair on Thursday (4/14),  he also noted that his stool was black.  Appetite has decreased since onset of symptoms on Friday.  Family activated EMS due to patient being lethargic and suspicion for sepsis, on arrival of EMS team, patient was reported to be in respiratory distress with wheezes and crackles, which resolved with lying supine.  He was also reported to be hypotensive, so 1 L of NS was given in route.  Patient endorsed increased cough with occasional clear phlegm and chronic mid back pain due to T3-T4  compression fracture, but denied fever, chills, chest pain, abdominal pain.  In the emergency department, he was tachycardic and tachypneic.  Work-up in the ED showed leukocytosis, macrocytic anemia, BUN was elevated at 59, CBG of 43, lactic acid 2.7, BNP 132.1, FOBT was positive.  Albumin 2.5.  Influenza A, B and SARS coronavirus 2 was negative. Chest x-ray showed severe emphysema with no active disease Breathing treatment was provided, Solu-Medrol was given. Patient was started on IV ceftriaxone due to presumed urosepsis.  Patient was started on IV Protonix drip.    Patient was admitted under hospitalist service with presumed sepsis due to UTI however patient's UA was completely unremarkable so antibiotics were discontinued next day.  He also had mild lactic acidosis due to multiple factors which resolved as well.  Also admitted for upper GI bleed/melena.  GI consulted however due to his advanced COPD and respiratory issues, they preferred conservative management and did not recommend EGD or colonoscopy.   They recommended weaning Protonix for total of 72 hours.  His hemoglobin remained stable.  patient then had slightly elevated but flat troponins and transthoracic echo showed further reduction in ejection fraction to 20 to 25% from 45% previously.  For this cardiology was consulted and they also recommended conservative management due to his advanced COPD.  His mild AKI had resolved with IV fluids.  Patient also seems to have mild acute on chronic combined systolic and diastolic congestive heart failure.  He has received IV Lasix for past 3 days and has improved.   Assessment & Plan:   Principal Problem:   Upper GI bleed Active Problems:   HTN (hypertension)   Hyperlipidemia   COPD (chronic obstructive pulmonary disease) (HCC)   Hypoxemic respiratory failure, chronic (HCC)   Physical deconditioning   Sepsis secondary to UTI (HCC)   Lactic acidosis   Acute dehydration   Hypoalbuminemia   Hyperglycemia   Leukocytosis   Macrocytic anemia   Elevated brain natriuretic peptide (BNP) level   Gout   Elevated troponin   Symptomatic anemia   Protein-calorie malnutrition, severe  Sepsis ruled out: Patient was admitted as presumed sepsis based off of tachycardia and tachypnea and possible UTI however patient's UA is completely clean with no signs of infection.  I do not think he has UTI.  His tachycardia and tachypnea was likely secondary to GI bleed and his COPD.  Antibiotics discontinued.  Lactic acidosis possibly secondary to multifactorial including above, hypoxia, dehydration.  Resolved  Upper GI bleed/melena Symptomatic anemia/Macrocytic Anemia H/H= 7.9/25.6, this was 15.8/46.3 (about 7 years  ago; most recent lab for comparison) Hemoccult was positive.  He received 1 unit of PRBC transfusion.  Hemoglobin improved to 10.  Seen by GI.  Due to his severe COPD, they recommend conservative management and recommend continuing Protonix drip for at least 3 days and then oral PPI twice daily  unless he has severe bleeding.  Patient was switched to oral Protonix on 02/02/2021.  They could consider doing EGD but he would need anesthesia consult.  Hemoglobin has remained stable.  Patient underwent upper GI series on 02/02/2021 which is unremarkable.  GI signed off.  No further signs of GI bleed.  Chronic respiratory failure with hypoxia secondary to COPD: Reportedly, patient uses 10 L of high flow oxygen at home.  Currently he is at his baseline oxygen requirement however today he feels more short of breath.  BNP checked that is elevated.  Likely has some component of acute on chronic combined congestive heart failure.  He has received IV Lasix 40 mg daily for past 3 days.  Although he is still requiring his baseline 10 L of high flow oxygen but he states that he does not feel any better than yesterday.  On examination, he has very faint crackles but much improved compared to yesterday.  I think now we can start him on daily oral Lasix however I will wait for cardiology for their opinion. Continue duo nebs, Mucinex, Solu-Medrol, albuterol, Pulmicort Continue incentive spirometry and flutter valve Continue supplemental oxygen to maintain O2 sat > 92%   Acute on chronic combined systolic and diastolic congestive heart failure: Echo on 02/01/2019 shows reduced ejection fraction of 25 to 30% compared to 45% in 2014 as well as grade 1 diastolic dysfunction.  Has slightly elevated cardiac enzymes but flat. BNP 132 and now 333 on 02/01/2021. Cardiology on board and they recommend pursuing conservative management due to his end-stage COPD.  Comments about Lasix as above.  Atrial tachycardia: Patient had episode of atrial tachycardia with a heart rate around 150 on the morning of 02/03/2021 however patient was asymptomatic and electrolytes were normal as well.  Seen by cardiology today and started on bisoprolol.  Hyperkalemia: Resolved  Mild AKI: Resolved.  Stop IV fluids.  Dehydration: Resolved.     Leukocytosis/Hyperglycemia possibly related to prednisone effect WBC 11.7, CBG 143.  Patient is chronically on prednisone at home Continue to monitor WBC.  Mild depression: Talking to the daughter, she mentions that patient has lately been very depressed.  He is not interested in eating.  She requesting to place him Remeron.  Orders placed on 02/03/2021.  Hypoalbuminemia possibly secondary to moderate protein calorie malnutrition.  Consult dietitian.  Hypertension: Blood pressure stable.  Avapro on hold due to recent AKI.  Continue to hold.  Hyperlipidemia Continue Niacin  Chronic back pain Continue Tylenol as needed  BPH Continue Flomax  Gout: Continue allopurinol  Goal of care discussion: Daughter has requested and in fact reached out to palliative care herself for them to be involved.  I have officially consulted palliative care as well.  She was requesting to allow multiple family members to visit the patient.  She was thoroughly informed about [facility] visitation policy.  DVT prophylaxis: SCDs Start: 02-26-21 2010   Code Status: Full Code  Family Communication: None present at bedside.  Discussed with daughter [name] over the phone.  Status is: Inpatient  Remains inpatient appropriate because:Inpatient level of care appropriate due to severity of illness   Dispo: The patient is from: Home  Anticipated d/c is to: Home              Patient currently is not medically stable to d/c.   Difficult to place patient No    Estimated body mass index is 21.31 kg/m as calculated from the following:   Height as of this encounter: 5\' 11"  (1.803 m).   Weight as of this encounter: 69.3 kg.      Nutritional status:  Nutrition Problem: Severe Malnutrition Etiology: chronic illness (COPD)   Signs/Symptoms: severe fat depletion,severe muscle depletion,percent weight loss Percent weight loss: 13.1 %   Interventions: Refer to RD note for  recommendations,Boost Breeze,MVI,Prostat    Consultants:   GI  Cardiology  Procedures:   None  Antimicrobials:  Anti-infectives (From admission, onward)   Start     Dose/Rate Route Frequency Ordered Stop   01/31/21 0800  cefTRIAXone (ROCEPHIN) 1 g in sodium chloride 0.9 % 100 mL IVPB  Status:  Discontinued        1 g 200 mL/hr over 30 Minutes Intravenous Every 24 hours 2021-06-19 2049 01/31/21 1023   2021-06-19 1645  cefTRIAXone (ROCEPHIN) 1 g in sodium chloride 0.9 % 100 mL IVPB        1 g 200 mL/hr over 30 Minutes Intravenous  Once 2021-06-19 1644 2021-06-19 1745         Subjective: Seen and examined.  He states that he does not feel any better than yesterday.  Did not complain of anything specifically.  Objective: Vitals:   02/03/21 2311 02/04/21 0303 02/04/21 0737 02/04/21 0827  BP: (!) 95/41 (!) 107/52 (!) 109/55   Pulse: 91 98 97   Resp: 18 (!) 26 (!) 36   Temp: 98.5 F (36.9 C) 98 F (36.7 C) 98.7 F (37.1 C)   TempSrc: Oral Oral Oral   SpO2: 93% 94% 98% 97%  Weight:      Height:        Intake/Output Summary (Last 24 hours) at 02/04/2021 1116 Last data filed at 02/03/2021 1611 Gross per 24 hour  Intake --  Output 700 ml  Net -700 ml   Filed Weights   02/02/21 0500 02/03/21 0500 02/03/21 1255  Weight: 68.4 kg 69.3 kg 69.3 kg    Examination: General exam: Appears calm but tired Respiratory system: Faint crackles at the bases bilaterally with some diminished breath sounds. Respiratory effort normal. Cardiovascular system: S1 & S2 heard, RRR. No JVD, murmurs, rubs, gallops or clicks. No pedal edema. Gastrointestinal system: Abdomen is nondistended, soft and nontender. No organomegaly or masses felt. Normal bowel sounds heard. Central nervous system: Alert and oriented. No focal neurological deficits. Extremities: Symmetric 5 x 5 power. Skin: No rashes, lesions or ulcers.  Psychiatry: Judgement and insight appear normal. Mood & affect appropriate.   Data  Reviewed: I have personally reviewed following labs and imaging studies  CBC: Recent Labs  Lab 01/31/21 0842 01/31/21 1811 02/01/21 0346 02/01/21 1619 02/02/21 1241 02/02/21 2238 02/03/21 0220 02/04/21 0312  WBC 12.2* 12.1* 14.9* 14.6* 11.7* 12.4* 14.3* 12.7*  NEUTROABS 10.2* 9.8* 12.3* 12.3*  --  9.5*  --   --   HGB 10.4* 9.8* 8.5* 8.9* 8.1* 8.5* 8.6* 8.8*  HCT 31.1* 30.2* 26.0* 27.7* 25.0* 26.0* 26.8* 27.6*  MCV 100.6* 100.7* 101.2* 103.0* 101.6* 100.8* 101.9* 103.0*  PLT 168 177 158 183 148* 166 166 200   Basic Metabolic Panel: Recent Labs  Lab 01/31/21 0334 02/01/21 0346 02/02/21 0254 02/02/21 2238 02/03/21 0220 02/04/21 0312  NA  140 141 140 138 139 139  K 5.2* 5.3* 4.1 4.1 4.2 3.8  CL 108 109 106 98 102 99  CO2 GLUCOSE 145* 117* 101* 97 102* 106*  BUN 53* 40* 28* 27* 28* 44*  CREATININE 1.27* 1.07 0.99 1.08 1.07 1.17  CALCIUM 8.5* 8.1* 8.0* 8.2* 8.4* 8.4*  MG 2.2 2.1  --  2.0  --   --   PHOS 4.8*  --   --  2.1*  --   --    GFR: Estimated Creatinine Clearance: 46.9 mL/min (by C-G formula based on SCr of 1.17 mg/dL). Liver Function Tests: Recent Labs  Lab 02/06/21 1625 01/31/21 0334 02/02/21 2238  AST 41 51*  --   ALT 37 46*  --   ALKPHOS 52 61  --   BILITOT 0.6 0.7  --   PROT 4.8* 5.8*  --   ALBUMIN 2.5* 3.1* 2.6*   No results for input(s): LIPASE, AMYLASE in the last 168 hours. No results for input(s): AMMONIA in the last 168 hours. Coagulation Profile: Recent Labs  Lab 2021-02-06 1625 01/31/21 0334  INR 1.2 1.1   Cardiac Enzymes: No results for input(s): CKTOTAL, CKMB, CKMBINDEX, TROPONINI in the last 168 hours. BNP (last 3 results) No results for input(s): PROBNP in the last 8760 hours. HbA1C: No results for input(s): HGBA1C in the last 72 hours. CBG: No results for input(s): GLUCAP in the last 168 hours. Lipid Profile: No results for input(s): CHOL, HDL, LDLCALC, TRIG, CHOLHDL, LDLDIRECT in the last 72 hours. Thyroid  Function Tests: No results for input(s): TSH, T4TOTAL, FREET4, T3FREE, THYROIDAB in the last 72 hours. Anemia Panel: No results for input(s): VITAMINB12, FOLATE, FERRITIN, TIBC, IRON, RETICCTPCT in the last 72 hours. Sepsis Labs: Recent Labs  Lab 2021-02-06 1625 06-Feb-2021 1825 01/31/21 0334 01/31/21 0842  PROCALCITON  --   --   --  0.14  LATICACIDVEN 2.7* 2.4* 1.8  --     Recent Results (from the past 240 hour(s))  Resp Panel by RT-PCR (Flu A&B, Covid) Nasopharyngeal Swab     Status: None   Collection Time: 02-06-21  4:25 PM   Specimen: Nasopharyngeal Swab; Nasopharyngeal(NP) swabs in vial transport medium  Result Value Ref Range Status   SARS Coronavirus 2 by RT PCR NEGATIVE NEGATIVE Final    Comment: (NOTE) SARS-CoV-2 target nucleic acids are NOT DETECTED.  The SARS-CoV-2 RNA is generally detectable in upper respiratory specimens during the acute phase of infection. The lowest concentration of SARS-CoV-2 viral copies this assay can detect is 138 copies/mL. A negative result does not preclude SARS-Cov-2 infection and should not be used as the sole basis for treatment or other patient management decisions. A negative result may occur with  improper specimen collection/handling, submission of specimen other than nasopharyngeal swab, presence of viral mutation(s) within the areas targeted by this assay, and inadequate number of viral copies(<138 copies/mL). A negative result must be combined with clinical observations, patient history, and epidemiological information. The expected result is Negative.  Fact Sheet for Patients:  BloggerCourse.com  Fact Sheet for Healthcare Providers:  SeriousBroker.it  This test is no t yet approved or cleared by the [place] FDA and  has been authorized for detection and/or diagnosis of SARS-CoV-2 by FDA under an Emergency Use Authorization (EUA). This EUA will remain  in effect (meaning  this test can be used) for the duration of the COVID-19 declaration under Section 564(b)(1) of the Act, 21 U.S.C.section 360bbb-3(b)(1),  unless the authorization is terminated  or revoked sooner.       Influenza A by PCR NEGATIVE NEGATIVE Final   Influenza B by PCR NEGATIVE NEGATIVE Final    Comment: (NOTE) The Xpert Xpress SARS-CoV-2/FLU/RSV plus assay is intended as an aid in the diagnosis of influenza from Nasopharyngeal swab specimens and should not be used as a sole basis for treatment. Nasal washings and aspirates are unacceptable for Xpert Xpress SARS-CoV-2/FLU/RSV testing.  Fact Sheet for Patients: BloggerCourse.com  Fact Sheet for Healthcare Providers: SeriousBroker.it  This test is not yet approved or cleared by the [place] FDA and has been authorized for detection and/or diagnosis of SARS-CoV-2 by FDA under an Emergency Use Authorization (EUA). This EUA will remain in effect (meaning this test can be used) for the duration of the COVID-19 declaration under Section 564(b)(1) of the Act, 21 U.S.C. section 360bbb-3(b)(1), unless the authorization is terminated or revoked.  Performed at [facility], [address], [place], [place] [address]   Blood Culture (routine x 2)     Status: None   Collection Time: 20-Feb-2021  4:25 PM   Specimen: BLOOD  Result Value Ref Range Status   Specimen Description BLOOD LEFT ANTECUBITAL  Final   Special Requests   Final    BOTTLES DRAWN AEROBIC AND ANAEROBIC Blood Culture results may not be optimal due to an inadequate volume of blood received in culture bottles   Culture   Final    NO GROWTH 5 DAYS Performed at [facility], [address], [place], [place] [address]    Report Status 02/04/2021 FINAL  Final  Blood Culture (routine x 2)     Status: None   Collection Time: 2021-02-20  4:30 PM   Specimen: BLOOD  Result Value Ref Range Status   Specimen Description  BLOOD RIGHT ANTECUBITAL  Final   Special Requests   Final    BOTTLES DRAWN AEROBIC AND ANAEROBIC Blood Culture results may not be optimal due to an inadequate volume of blood received in culture bottles   Culture   Final    NO GROWTH 5 DAYS Performed at [facility], [address] [place], [place] [address]    Report Status 02/04/2021 FINAL  Final      Radiology Studies: DG UGI W SINGLE CM (SOL OR THIN BA)  Result Date: 02/02/2021 CLINICAL DATA:  GI bleed. EXAM: UPPER GI SERIES WITH KUB TECHNIQUE: After obtaining a scout radiograph a routine upper GI series was performed using thin barium FLUOROSCOPY TIME:  Fluoroscopy Time:  1 minutes 30 second Radiation Exposure Index (if provided by the fluoroscopic device): Number of Acquired Spot Images: 15 COMPARISON:  None. FINDINGS: Preliminary KUB demonstrates normal bowel gas pattern. The patient drank an adequate amount of thin barium through a straw. Normal esophageal function. No esophageal stricture or mass. No hiatal hernia. Limited mobility. The stomach is evaluated in the right lateral view and both oblique views. Stomach normal in volume. Normal gastric mucosa. No ulcer or mass in the stomach. Normal gastric emptying. Duodenal bulb normal. No ulcer. Normal duodenal C loop. IMPRESSION: Negative upper GI.  No ulcer or mass identified. Electronically Signed   By: Marlan Palau M.D.   On: 02/02/2021 13:43    Scheduled Meds: . (feeding supplement) PROSource Plus  30 mL Oral BID BM  . sodium chloride   Intravenous Once  . allopurinol  100 mg Oral Daily  . aspirin  81 mg Oral Daily  .  bisoprolol  2.5 mg Oral Daily  . budesonide  0.5 mg Nebulization BID  . calcium carbonate  625 mg Oral Daily  . dextromethorphan-guaiFENesin  1 tablet Oral BID  . feeding supplement  237 mL Oral TID BM  . levalbuterol  1.25 mg Nebulization BID  . mirtazapine  15 mg Oral QHS  . multivitamin with minerals  1 tablet Oral Daily  . niacin  250 mg Oral QHS   . pantoprazole  40 mg Oral BID  . predniSONE  10 mg Oral Q breakfast  . tamsulosin  0.4 mg Oral Daily  . vitamin B-12  1,000 mcg Oral Daily   Continuous Infusions:    LOS: 5 days   Time spent: 29 minutes   Hughie Closs, MD [facility]  02/04/2021, 11:16 AM   To contact the attending provider between 7A-7P or the covering provider during after hours 7P-7A, please log into the web site [email or web address].

## 2021-02-04 NOTE — Consult Note (Signed)
Palliative Medicine Inpatient Consult Note  Reason for consult:  Goals of Care  HPI:  Per intake H&P --> Charles Archer a 85 y.o.malewith medical history significant forCOPD on 10 L of oxygen via HFNC, BPH, hypertension, hyperlipidemia, gout who presented to ED due to shortness of on exertion since Friday (4/15), he endorsed urinary incontinence while on his wheelchair on Thursday (4/14),  he also noted that his stool was black.   Palliative care was asked to evaluate Daiva Huge and speak to his daughter, Eustaquio Maize regarding goals of care  Clinical Assessment/Goals of Care:  *Please note that this is a verbal dictation therefore any spelling or grammatical errors are due to the "[place]" system interpretation.  I have reviewed medical records including EPIC notes, labs and imaging, received report from bedside RN, assessed the patient who is lying in bed in no acute distress.  Kino is somnolent though will arouse for short periods to verbal stimulation.    I met with Shea Stakes (daughter)  to further discuss diagnosis prognosis, GOC, EOL wishes, disposition and options.   I introduced Palliative Medicine as specialized medical care for people living with serious illness. It focuses on providing relief from the symptoms and stress of a serious illness. The goal is to improve quality of life for both the patient and the family.  Benjamine Mola shares with me that her father is originally from a small town outside of [place], [place].  Though he is lived in [place], [place] for the majority of his life.  He is married to his spouse Wells Guiles.  He and Wells Guiles share a son and a daughter as well as 7 grandchildren.  He used to work at [employer].  He is a man who enjoys sports and used to very much like to play golf and watch [place] basketball and football.  He is not overtly faithful though he was raised within the [facility].  Prior to admission  Sergio was able to aid in basic activities of daily living for himself.  He and his wife live in a single-family home and his son and grandson also live there, they are in and out in the setting of their jobs.    He is known to been the caregiver for his wife, Wells Guiles from the year 2010 to about 2020 due to a variety of orthopedic injuries.  Her health has improved.  Benjamine Mola shares that as her mother's health improved Bowman's health deteriorated in the setting of his chronic emphysema.  Miron was diagnosed with emphysema in 2018 and per Benjamine Mola has not required hospitalization until coming and most recently which was more so identified for an ulcer then for "difficulty breathing".  Patient has had deficits over the past few years in terms of his declining health state in terms of functionality and presently is at a point where he can mobilize very little without feeling profoundly short of breath.  In his home his oxygen concentrator remains on 10 L a minute.  We reviewed that patient's with decreased activity tolerance are often identified as at the end stage of their disease condition  A detailed discussion was had today regarding advanced directives -Benjamine Mola shares that she had been unable to complete these with her father.    Hermelinda Dellen shares that she has been making the majority of decisions for her father is her mother, Wells Guiles often becomes confused and forgetful.  A MOST form was presented and concepts specific to code status, artifical  feeding and hydration, continued IV antibiotics and rehospitalization was had.    We reviewed the burdens that cardiopulmonary resuscitation may have on a patient and that in Trimaine's case it is likely to cause more harm than benefit.  Benjamine Mola understands this and would prefer for Nayson to be DO NOT RESUSCITATE DO NOT INTUBATE CODE STATUS.   The difference between a aggressive medical intervention path and a palliative comfort care path for this patient at  this time was had.  Is able to broach the topic of hospice.  I described hospice as a service for patients for have a life expectancy of < 6 months. It preserves dignity and quality at the end phases of life. The focus changes from curative to symptom relief.  Benjamine Mola shares that she is interested in hospice care for Midas as she believes he is at the end stage of his disease process.  We discussed that hospice can be done anywhere in the home or in a residential setting.  I shared with Benjamine Mola my concern that [facility] at this point in time would likely not meet inpatient hospice criteria standards.  Reviewed alternative options such as him going to an outpatient rehabilitation to optimize his strength then discharging back to his home with enrollment in hospice.  In the meanwhile I have asked for case manager to get involved in to provide hospice choice to Benjamine Mola so that she may be well-informed and moving forward.  Phillips Climes and I agreed to meet tomorrow with her mom so that we could further discuss the MOST form.  Confirmed that patient is DO NOT RESUSCITATE CODE STATUS and an order would be placed to reflect this.  At this point time goals for Thayer are to be in a place where his medical cares can be done met and he can remain happy watching his sports television shows.  Discussed the importance of continued conversation with family and their  medical providers regarding overall plan of care and treatment options, ensuring decisions are within the context of the patients values and GOCs.  Decision Maker: Shea Stakes (Daughter) [phone]  SUMMARY OF RECOMMENDATIONS   DNAR/DNI  Gold DNR will be placed in the front of the chart  Appreciate transitions of care team consulting hospice so that a liaison could meet with patient's daughter to better describe their services  Patient will likely need short-term SNF placement prior to going home with potential hospice enrollment  Ongoing  goals of care conversations to ensue in the oncoming days  Code Status/Advance Care Planning: DNAR/DNI   Symptom Management:  Delirium precautions:              -Lights and TV off, minimize interruptions at night             -Blinds open and lights on during day             -Glasses/hearing aid with patient                 - Get up during the day                 - Encourage a familiar face to remain present throughout the day             -Frequent reorientation             -PT/OT when able             -Avoid sedation medications/Beers list medications   Palliative Prophylaxis:  Oral care, mobility, delirium precautions  Additional Recommendations (Limitations, Scope, Preferences):  Treat what is treatable   Psycho-social/Spiritual:   Desire for further Chaplaincy support:  No  Additional Recommendations:  Education on progressive nature of COPD   Prognosis:  Patient with increased frailty, hypoalbuminemia, and COPD disease burden restricting prolonged movement. He has very high risk for decompensation and mortality within a 36-monthperiod of time  Discharge Planning:  Discharge plan unclear though patient will most certainly need short-term placement  Vitals:   02/04/21 0737 02/04/21 0827  BP: (!) 109/55   Pulse: 97   Resp: (!) 36   Temp: 98.7 F (37.1 C)   SpO2: 98% 97%    Intake/Output Summary (Last 24 hours) at 02/04/2021 1544 Last data filed at 02/03/2021 1611 Gross per 24 hour  Intake --  Output 700 ml  Net -700 ml   Last Weight  Most recent update: 02/03/2021 12:56 PM   Weight  69.3 kg (152 lb 12.5 oz)           Gen:  Frail elderly M in NAD HEENT: moist mucous membranes CV: Regular rate and rhythm  PULM: On 10LPM [place] ABD: soft/nontender  EXT: No edema  Neuro: Opens eyes incrementally to verbal stimulation though shortly thereafter dozes back off to sleep  PPS: 30%   This conversation/these recommendations were discussed with patient primary care  team, Dr. PDoristine Bosworth Time In: 1540 Time Out: 1650 Total Time: 70 Greater than 50%  of this time was spent counseling and coordinating care related to the above assessment and plan.  MDeliaTeam Team Cell Phone: [phone] utilize secure chat with additional questions, if there is no response within 30 minutes please call the above phone number  Palliative Medicine Team providers are available by phone from 7am to 7pm daily and can be reached through the team cell phone.  Should this patient require assistance outside of these hours, please call the patient's attending physician.

## 2021-02-05 DIAGNOSIS — Z515 Encounter for palliative care: Secondary | ICD-10-CM | POA: Diagnosis not present

## 2021-02-05 DIAGNOSIS — K922 Gastrointestinal hemorrhage, unspecified: Secondary | ICD-10-CM | POA: Diagnosis not present

## 2021-02-05 DIAGNOSIS — I5043 Acute on chronic combined systolic (congestive) and diastolic (congestive) heart failure: Secondary | ICD-10-CM | POA: Diagnosis not present

## 2021-02-05 LAB — BASIC METABOLIC PANEL
Anion gap: 10 (ref 5–15)
BUN: 55 mg/dL — ABNORMAL HIGH (ref 8–23)
CO2: 32 mmol/L (ref 22–32)
Calcium: 8.6 mg/dL — ABNORMAL LOW (ref 8.9–10.3)
Chloride: 98 mmol/L (ref 98–111)
Creatinine, Ser: 1.44 mg/dL — ABNORMAL HIGH (ref 0.61–1.24)
GFR, Estimated: 48 mL/min — ABNORMAL LOW (ref >60)
Glucose, Bld: 142 mg/dL — ABNORMAL HIGH (ref 70–99)
Potassium: 4.2 mmol/L (ref 3.5–5.1)
Sodium: 140 mmol/L (ref 135–145)

## 2021-02-05 NOTE — Progress Notes (Signed)
Pt pulling out iv lines and pulling off electrodes. Pt also refusing to keep oxygen in place. Staff attempting to keep pt comfortable. Will continue to monitor and tx as indicated.

## 2021-02-05 NOTE — Progress Notes (Addendum)
Progress Note  Patient Name: ESTANISLADO SURGEON Date of Encounter: 02/05/2021  [facility] Cardiologist: Parke Poisson, MD   Subjective   Denies CP; complains of mild dyspnea  Inpatient Medications    Scheduled Meds: . (feeding supplement) PROSource Plus  30 mL Oral BID BM  . sodium chloride   Intravenous Once  . allopurinol  100 mg Oral Daily  . aspirin  81 mg Oral Daily  . bisoprolol  2.5 mg Oral Daily  . budesonide  0.5 mg Nebulization BID  . calcium carbonate  625 mg Oral Daily  . dextromethorphan-guaiFENesin  1 tablet Oral BID  . feeding supplement  237 mL Oral TID BM  . furosemide  40 mg Oral Daily  . levalbuterol  1.25 mg Nebulization BID  . mirtazapine  15 mg Oral QHS  . multivitamin with minerals  1 tablet Oral Daily  . niacin  250 mg Oral QHS  . pantoprazole  40 mg Oral BID  . predniSONE  10 mg Oral Q breakfast  . tamsulosin  0.4 mg Oral Daily  . vitamin B-12  1,000 mcg Oral Daily   Continuous Infusions:  PRN Meds: acetaminophen, albuterol, docusate sodium, levalbuterol   Vital Signs    Vitals:   02/05/21 0027 02/05/21 0355 02/05/21 0749 02/05/21 0754  BP: (!) 103/54 115/60  124/77  Pulse: (!) 105 (!) 101  92  Resp:  (!) 25  20  Temp: (!) 97.5 F (36.4 C) 98.4 F (36.9 C)  97.8 F (36.6 C)  TempSrc: Axillary Axillary  Axillary  SpO2: 100% 100% 98% 98%  Weight:  65.7 kg    Height:        Intake/Output Summary (Last 24 hours) at 02/05/2021 0925 Last data filed at 02/05/2021 0300 Gross per 24 hour  Intake --  Output 550 ml  Net -550 ml   Last 3 Weights 02/05/2021 02/03/2021 02/03/2021  Weight (lbs) 144 lb 13.5 oz 152 lb 12.5 oz 152 lb 12.5 oz  Weight (kg) 65.7 kg 69.3 kg 69.3 kg      Telemetry     Sinus to sinus tachycardia with pacs- Personally Reviewed   Physical Exam   GEN: No acute distress.   Neck: No JVD Cardiac: RRR Respiratory: Diminished BS GI: Soft, nontender, non-distended  MS: No edema Neuro:  Nonfocal  Psych: Normal  affect   Labs    High Sensitivity Troponin:   Recent Labs  Lab 02/01/2021 1625 02/02/2021 1826 01/31/21 0334  TROPONINIHS 36* 37* 60*      Chemistry Recent Labs  Lab 01/29/2021 1625 01/23/2021 1729 01/31/21 0334 02/01/21 0346 02/02/21 2238 02/03/21 0220 02/04/21 0312 02/05/21 0212  NA 139   < > 140   < > 138 139 139 140  K 5.1   < > 5.2*   < > 4.1 4.2 3.8 4.2  CL 107  --  108   < > 98 102 99 98  CO2 25  --  26   < > 30 28 30  32  GLUCOSE 143*  --  145*   < > 97 102* 106* 142*  BUN 59*  --  53*   < > 27* 28* 44* 55*  CREATININE 1.17  --  1.27*   < > 1.08 1.07 1.17 1.44*  CALCIUM 7.7*  --  8.5*   < > 8.2* 8.4* 8.4* 8.6*  PROT 4.8*  --  5.8*  --   --   --   --   --  ALBUMIN 2.5*  --  3.1*  --  2.6*  --   --   --   AST 41  --  51*  --   --   --   --   --   ALT 37  --  46*  --   --   --   --   --   ALKPHOS 52  --  61  --   --   --   --   --   BILITOT 0.6  --  0.7  --   --   --   --   --   GFRNONAA >60  --  56*   < > >60 >60 >60 48*  ANIONGAP 7  --  6   < > 10 9 10 10    < > = values in this interval not displayed.     Hematology Recent Labs  Lab 02/02/21 2238 02/03/21 0220 02/04/21 0312  WBC 12.4* 14.3* 12.7*  RBC 2.58* 2.63* 2.68*  HGB 8.5* 8.6* 8.8*  HCT 26.0* 26.8* 27.6*  MCV 100.8* 101.9* 103.0*  MCH 32.9 32.7 32.8  MCHC 32.7 32.1 31.9  RDW 16.1* 16.1* 16.0*  PLT 166 166 200    BNP Recent Labs  Lab 02/07/2021 1626 02/01/21 0346  BNP 132.1* 333.0*     Patient Profile     Yvonne Petite Nicksis a 85 y.o.malewith a hx of HTN, HLD, oxygen dependent COPD (10L) and CKDwho is being seen  for the evaluation of CHF.   Admitted 10/2013 for dyspnea on exertion.Myoview study demonstrates a fixed inferior wall defect. Echo shows an EF of 45% with global hypokinesis.Cath showed non obstructive (10-20% diffuse LAD; 20-30% RCA) CAD. Echocardiogram this admission shows ejection fraction 25 to 30%, grade 1 diastolic dysfunction, severe right ventricular enlargement, mild  aortic insufficiency.  Assessment & Plan    1 acute on chronic combined systolic/diastolic congestive heart failure-he is not volume overloaded on examination today and his BUN creatinine ratio has increased.  We will hold diuretics for now.  Can resume lower dose diuretics (ie 20 mg daily) as renal function improves.  2 severe COPD-pulmonary toilet per primary care.  This appears to be the predominant issue.  Palliative care has been consulted.  3 cardiomyopathy-we will continue low-dose beta-blocker.  Will not add ACE inhibitor, ARB or Entresto in the setting of worsening renal function and borderline blood pressure.  Patient also on palliative care and plan is for hospice.  4 recent GI bleed-hemoglobin is stable.  We will see again Monday if patient is still in the hospital.  Please call with questions prior to then.  For questions or updates, please contact [facility] Please consult [email or web address] for contact info under        Signed, Thursday, MD  02/05/2021, 9:25 AM

## 2021-02-05 NOTE — Progress Notes (Signed)
Manufacturing engineer [facility])  Received request from Palliative Medical Team to meet with pt and family to initiate education related to hospice philosophy and services and to answer any questions.  This liaison met with pt, pt's spouse, and pt's daughter Eustaquio Maize at bedside.  Discussion had of hospice services and support in the home.  Pt was AOx4 throughout meeting and able to share wishes and priorities.  Tacey Ruiz, NP with PMT, joined meeting.  Pt expressed interest in short term rehab if PT/OT deem him a candidate.  [name] shared concern about providing care at home.  Discussion of paid caregivers, monitors, alert buttons/services had,  encouraged [name] to reach out to [facility] for resources.   ACC information and contact numbers given to [facility].  [place] liaisons will continue to follow.  Please call with any questions or concerns.  Thank you for the opportunity to participate in this pt's care.  Domenic Moras, BSN, RN [facility] [phone] (24h on call)

## 2021-02-05 NOTE — Progress Notes (Signed)
PROGRESS NOTE    Charles Archer  JKD:[id number] DOB: October 28, 1935 DOA: 01/31/2021 PCP: Daisy Floro, MD   Chief Complaint  Patient presents with  . Code Sepsis   Brief Narrative: 85 year old male with complex medical history with hypertension, hyperlipidemia, chronic oxygen dependent/hypoxic respiratory failure with COPD, on 10 L of nasal cannula,  Gout presented to the ED with shortness of breath on exertion on 01/28/2021 and also was complaining of urinary incontinence while on the wheelchair on Thursday 4/14 and noted to have blackish stool, decreased appetite. EMS was called patient was lethargic sepsis was suspected on arrival to the ED and was in respiratory distress wheezing with crackles, also hypotensive needing IV fluids. Also had leukocytosis and anemia tachycardia tachypnea chest x-ray with severe emphysema no active disease COVID-19 influenza AB-.  Patient was given Solu-Medrol antibiotics for possible urosepsis and Protonix drip for GI bleed and was admitted for UTI?Marland Kitchen Antibiotics were subsequently discontinued and UTI was not found multiple etiology suspected for some mild lactic acidosis with GI bleed melena.  Seen by GI, treated with Protonix x72 hours hemoglobin remained stable deduce cardiomyopathy respiratory failure was also seen by cardiology. Given his complex medical comorbidities, poor respiratory status palliative care was consulted. Palliative care has seen the patient plan is for DNR/DNI [facility] consulted for hospice evaluation, may likely need short-term SNF placement prior to going home with a potential hospice enrollment as per palliative.  Subjective: Overnight remains on 10 L high flow nasal cannula, heart rate 92 blood pressure stable he has been tachypneic from 20-36 range BUN/creatinine is uptrending 1.44 from 1.17. Patient complaining of mild dyspnea. Otherwise resting comfortably,does not appear to be in distress.  Assessment & Plan:  Acute on chronic  combined systolic/diastolic CHF: Cardiology following and is being diuresed.  Today BUN/creatinine is uptrending and not appearing volume overloaded on exam so holding diuretics, reassess BMP in the morning before resuming Lasix.  Cardiomyopathy continue low-dose beta-blocker no ACE inhibitor or ARB or Entresto due to worsening renal function and borderline blood pressure.  Palliative care to continue.  Chronic hypoxic respiratory failure on 10 L nasal cannula Severe COPD Continue 10 L high flow nasal cannula.  Continue pulmonary toileting bronchodilators.  Palliative care has been consulted.  Continue pulmonary support incentive spirometry Mucinex, oral prednisone.  Recent GI bleeding hemoglobin is stable, GI has seen the patient not pursuing scope given his poor respiratory status  AKI BUN/creatinine uptrending Lasix is on hold for now repeat BMP in the morning. Recent Labs  Lab 02/02/21 0254 02/02/21 2238 02/03/21 0220 02/04/21 0312 02/05/21 0212  BUN 28* 27* 28* 44* 55*  CREATININE 0.99 1.08 1.07 1.17 1.44*   Mild depression Remeron added  Severe protein calorie malnutrition augment dietitian dietitian on board.    Leukocytosis/hyperglycemia likely in the setting of steroid use.  Hypertension BP stable.  Monitor.  Atrial tachycardia up to 150s on the morning of 4/21.  Plan per cardiology continue beta-blocker  BPH continue Flomax  Gout:continue allopurinol  Goals of care: Palliative care input appreciated patient is DNR/DNI, at risk of decompensation overall poor prognosis planning for short-term SNF then home with hospice per palliative care discussion.  Diet Order            DIET SOFT Room service appropriate? No; Fluid consistency: Thin  Diet effective now                 Nutrition Problem: Severe Malnutrition Etiology: chronic illness (COPD) Signs/Symptoms: severe fat depletion,severe  muscle depletion,percent weight loss Percent weight loss: 13.1  % Interventions: Refer to RD note for recommendations,Boost Breeze,MVI,Prostat Patient's Body mass index is 20.2 kg/m.  DVT prophylaxis: SCDs Start: 2021/07/08 2010 Code Status:   Code Status: DNR  Family Communication: plan of care discussed with patient at bedside.  Status is: Inpatient Remains inpatient appropriate because:Inpatient level of care appropriate due to severity of illness  Dispo: The patient is from: Home              Anticipated d/c is to: SNF/TBD              Patient currently is not medically stable to d/c.   Difficult to place patient No Unresulted Labs (From admission, onward)          Start     Ordered   2021/07/08 1625  Urine culture  (Septic presentation on arrival (screening labs, nursing and treatment orders for obvious sepsis))  ONCE - STAT,   STAT        2021/07/08 1625           Medications reviewed:  Scheduled Meds: . (feeding supplement) PROSource Plus  30 mL Oral BID BM  . sodium chloride   Intravenous Once  . allopurinol  100 mg Oral Daily  . aspirin  81 mg Oral Daily  . bisoprolol  2.5 mg Oral Daily  . budesonide  0.5 mg Nebulization BID  . calcium carbonate  625 mg Oral Daily  . dextromethorphan-guaiFENesin  1 tablet Oral BID  . feeding supplement  237 mL Oral TID BM  . levalbuterol  1.25 mg Nebulization BID  . mirtazapine  15 mg Oral QHS  . multivitamin with minerals  1 tablet Oral Daily  . niacin  250 mg Oral QHS  . pantoprazole  40 mg Oral BID  . predniSONE  10 mg Oral Q breakfast  . tamsulosin  0.4 mg Oral Daily  . vitamin B-12  1,000 mcg Oral Daily   Continuous Infusions:  Consultants:see note  Procedures:see note  Antimicrobials: Anti-infectives (From admission, onward)   Start     Dose/Rate Route Frequency Ordered Stop   01/31/21 0800  cefTRIAXone (ROCEPHIN) 1 g in sodium chloride 0.9 % 100 mL IVPB  Status:  Discontinued        1 g 200 mL/hr over 30 Minutes Intravenous Every 24 hours 2021/07/08 2049 01/31/21 1023   2021/07/08  1645  cefTRIAXone (ROCEPHIN) 1 g in sodium chloride 0.9 % 100 mL IVPB        1 g 200 mL/hr over 30 Minutes Intravenous  Once 2021/07/08 1644 2021/07/08 1745     Culture/Microbiology    Component Value Date/Time   SDES BLOOD RIGHT ANTECUBITAL 08-25-2021 1630   SPECREQUEST  08-25-2021 1630    BOTTLES DRAWN AEROBIC AND ANAEROBIC Blood Culture results may not be optimal due to an inadequate volume of blood received in culture bottles   CULT  08-25-2021 1630    NO GROWTH 5 DAYS Performed at [facility], [address] [place], [place] [address]    REPTSTATUS 02/04/2021 FINAL 08-25-2021 1630    Other culture-see note  Objective: Vitals: Today's Vitals   02/05/21 0355 02/05/21 0749 02/05/21 0754 02/05/21 0755  BP: 115/60  124/77   Pulse: (!) 101  92   Resp: (!) 25  20   Temp: 98.4 F (36.9 C)  97.8 F (36.6 C)   TempSrc: Axillary  Axillary   SpO2: 100% 98% 98%   Weight:  65.7 kg     Height:      PainSc:    Asleep    Intake/Output Summary (Last 24 hours) at 02/05/2021 1140 Last data filed at 02/05/2021 0300 Gross per 24 hour  Intake --  Output 550 ml  Net -550 ml   Filed Weights   02/03/21 0500 02/03/21 1255 02/05/21 0355  Weight: 69.3 kg 69.3 kg 65.7 kg   Weight change: -3.6 kg  Intake/Output from previous day: 04/22 0701 - 04/23 0700 In: -  Out: 550 [Urine:550] Intake/Output this shift: No intake/output data recorded. Filed Weights   02/03/21 0500 02/03/21 1255 02/05/21 0355  Weight: 69.3 kg 69.3 kg 65.7 kg    Examination: General exam: Ill looking,frail, on 10 nasal cannula HEENT:Oral mucosa moist, Ear/Nose WNL grossly,dentition normal. Respiratory system: bilaterally diminished,no use of accessory muscle, non tender. Cardiovascular system: S1 & S2 +, regular, No JVD. Gastrointestinal system: Abdomen soft, NT,ND, BS+. Nervous System:Alert, awake, moving extremities and grossly nonfocal Extremities: No edema, distal peripheral pulses palpable.  Skin: No  rashes,no icterus. MSK: Normal muscle bulk,tone, power  Data Reviewed: I have personally reviewed following labs and imaging studies CBC: Recent Labs  Lab 01/31/21 0842 01/31/21 1811 02/01/21 0346 02/01/21 1619 02/02/21 1241 02/02/21 2238 02/03/21 0220 02/04/21 0312  WBC 12.2* 12.1* 14.9* 14.6* 11.7* 12.4* 14.3* 12.7*  NEUTROABS 10.2* 9.8* 12.3* 12.3*  --  9.5*  --   --   HGB 10.4* 9.8* 8.5* 8.9* 8.1* 8.5* 8.6* 8.8*  HCT 31.1* 30.2* 26.0* 27.7* 25.0* 26.0* 26.8* 27.6*  MCV 100.6* 100.7* 101.2* 103.0* 101.6* 100.8* 101.9* 103.0*  PLT 168 177 158 183 148* 166 166 200   Basic Metabolic Panel: Recent Labs  Lab 01/31/21 0334 02/01/21 0346 02/02/21 0254 02/02/21 2238 02/03/21 0220 02/04/21 0312 02/05/21 0212  NA 140 141 140 138 139 139 140  K 5.2* 5.3* 4.1 4.1 4.2 3.8 4.2  CL 108 109 106 98 102 99 98  CO2 32  GLUCOSE 145* 117* 101* 97 102* 106* 142*  BUN 53* 40* 28* 27* 28* 44* 55*  CREATININE 1.27* 1.07 0.99 1.08 1.07 1.17 1.44*  CALCIUM 8.5* 8.1* 8.0* 8.2* 8.4* 8.4* 8.6*  MG 2.2 2.1  --  2.0  --   --   --   PHOS 4.8*  --   --  2.1*  --   --   --    GFR: Estimated Creatinine Clearance: 36.1 mL/min (A) (by C-G formula based on SCr of 1.44 mg/dL (H)). Liver Function Tests: Recent Labs  Lab 02/03/2021 1625 01/31/21 0334 02/02/21 2238  AST 41 51*  --   ALT 37 46*  --   ALKPHOS 52 61  --   BILITOT 0.6 0.7  --   PROT 4.8* 5.8*  --   ALBUMIN 2.5* 3.1* 2.6*   No results for input(s): LIPASE, AMYLASE in the last 168 hours. No results for input(s): AMMONIA in the last 168 hours. Coagulation Profile: Recent Labs  Lab 01/14/2021 1625 01/31/21 0334  INR 1.2 1.1   Cardiac Enzymes: No results for input(s): CKTOTAL, CKMB, CKMBINDEX, TROPONINI in the last 168 hours. BNP (last 3 results) No results for input(s): PROBNP in the last 8760 hours. HbA1C: No results for input(s): HGBA1C in the last 72 hours. CBG: No results for input(s): GLUCAP in the last  168 hours. Lipid Profile: No results for input(s): CHOL, HDL, LDLCALC, TRIG, CHOLHDL, LDLDIRECT in the last 72 hours. Thyroid Function Tests: No  results for input(s): TSH, T4TOTAL, FREET4, T3FREE, THYROIDAB in the last 72 hours. Anemia Panel: No results for input(s): VITAMINB12, FOLATE, FERRITIN, TIBC, IRON, RETICCTPCT in the last 72 hours. Sepsis Labs: Recent Labs  Lab 01/18/2021 1625 01/22/2021 1825 01/31/21 0334 01/31/21 0842  PROCALCITON  --   --   --  0.14  LATICACIDVEN 2.7* 2.4* 1.8  --     Recent Results (from the past 240 hour(s))  Resp Panel by RT-PCR (Flu A&B, Covid) Nasopharyngeal Swab     Status: None   Collection Time: 02/05/2021  4:25 PM   Specimen: Nasopharyngeal Swab; Nasopharyngeal(NP) swabs in vial transport medium  Result Value Ref Range Status   SARS Coronavirus 2 by RT PCR NEGATIVE NEGATIVE Final    Comment: (NOTE) SARS-CoV-2 target nucleic acids are NOT DETECTED.  The SARS-CoV-2 RNA is generally detectable in upper respiratory specimens during the acute phase of infection. The lowest concentration of SARS-CoV-2 viral copies this assay can detect is 138 copies/mL. A negative result does not preclude SARS-Cov-2 infection and should not be used as the sole basis for treatment or other patient management decisions. A negative result may occur with  improper specimen collection/handling, submission of specimen other than nasopharyngeal swab, presence of viral mutation(s) within the areas targeted by this assay, and inadequate number of viral copies(<138 copies/mL). A negative result must be combined with clinical observations, patient history, and epidemiological information. The expected result is Negative.  Fact Sheet for Patients:  BloggerCourse.com  Fact Sheet for Healthcare Providers:  SeriousBroker.it  This test is no t yet approved or cleared by the [place] FDA and  has been authorized for  detection and/or diagnosis of SARS-CoV-2 by FDA under an Emergency Use Authorization (EUA). This EUA will remain  in effect (meaning this test can be used) for the duration of the COVID-19 declaration under Section 564(b)(1) of the Act, 21 U.S.C.section 360bbb-3(b)(1), unless the authorization is terminated  or revoked sooner.       Influenza A by PCR NEGATIVE NEGATIVE Final   Influenza B by PCR NEGATIVE NEGATIVE Final    Comment: (NOTE) The Xpert Xpress SARS-CoV-2/FLU/RSV plus assay is intended as an aid in the diagnosis of influenza from Nasopharyngeal swab specimens and should not be used as a sole basis for treatment. Nasal washings and aspirates are unacceptable for Xpert Xpress SARS-CoV-2/FLU/RSV testing.  Fact Sheet for Patients: BloggerCourse.com  Fact Sheet for Healthcare Providers: SeriousBroker.it  This test is not yet approved or cleared by the [place] FDA and has been authorized for detection and/or diagnosis of SARS-CoV-2 by FDA under an Emergency Use Authorization (EUA). This EUA will remain in effect (meaning this test can be used) for the duration of the COVID-19 declaration under Section 564(b)(1) of the Act, 21 U.S.C. section 360bbb-3(b)(1), unless the authorization is terminated or revoked.  Performed at [facility], [address] [place], [place] [address]   Blood Culture (routine x 2)     Status: None   Collection Time: 01/27/2021  4:25 PM   Specimen: BLOOD  Result Value Ref Range Status   Specimen Description BLOOD LEFT ANTECUBITAL  Final   Special Requests   Final    BOTTLES DRAWN AEROBIC AND ANAEROBIC Blood Culture results may not be optimal due to an inadequate volume of blood received in culture bottles   Culture   Final    NO GROWTH 5 DAYS Performed at [facility], [name] [place], [place] [address]  Report Status 02/04/2021 FINAL  Final  Blood Culture (routine x  2)     Status: None   Collection Time: 19-Feb-2021  4:30 PM   Specimen: BLOOD  Result Value Ref Range Status   Specimen Description BLOOD RIGHT ANTECUBITAL  Final   Special Requests   Final    BOTTLES DRAWN AEROBIC AND ANAEROBIC Blood Culture results may not be optimal due to an inadequate volume of blood received in culture bottles   Culture   Final    NO GROWTH 5 DAYS Performed at [facility], [address], [place], [place] [address]    Report Status 02/04/2021 FINAL  Final     Radiology Studies: No results found.   LOS: 6 days   Lanae Boast, MD [facility]  02/05/2021, 11:40 AM

## 2021-02-05 NOTE — TOC Progression Note (Signed)
Transition of Care [facility]) - Progression Note    Patient Details  Name: HATEM CULL MRN: [id number] Date of Birth: 26-Nov-1935  Transition of [facility] CM/SW Contact  Carley Hammed, [place] Phone Number: 02/05/2021, 4:43 PM  Clinical Narrative:    CSW was consulted by Palliative to provide resources for in home caregivers and life alert systems. CSW provided pamphlets for two life alert companies and information for [facility] as well. Family is appreciative, and noted they are unsure of what his disposition looks like at this time. SW will continue to follow to assist with transitions of care.        Expected Discharge Plan and Services                                                 Social Determinants of Health (SDOH) Interventions    Readmission Risk Interventions No flowsheet data found.

## 2021-02-05 NOTE — Progress Notes (Addendum)
Palliative Medicine Inpatient Follow Up Note   Reason for consult:  Goals of Care  HPI:  Per intake H&P --> Charles Brusca Nicksis a 85 y.o.malewith medical history significant forCOPD on 10 L of oxygen via HFNC, BPH, hypertension, hyperlipidemia, gout who presented to ED due to shortness of on exertion since Friday (4/15), he endorsed urinary incontinence while on his wheelchair on Thursday (4/14), he also noted that his stool was black.   Palliative care was asked to evaluate Charles Archer and speak to his daughter, Charles Archer regarding goals of care  Today's Discussion (02/05/2021):  *Please note that this is a verbal dictation therefore any spelling or grammatical errors are due to the "[place] One" system interpretation.  Chart reviewed. I met with patient this morning. He was noted to be resting on 10LPM [place], more somnolent. Patients daughter, Charles Archer was not present at bedside this morning.  I was able to speak to Charles Archer to reach out to patients daughter to offer further information on their Archer and palliative care services.  As of present the plan remains for a short term SNF stay and then home with Archer. If patient should decline while in house further conversations on comfort focused care will ensue.  Questions and concerns addressed   Objective Assessment: Vital Signs Vitals:   02/05/21 0754 02/05/21 1140  BP: 124/77 (!) 92/58  Pulse: 92 93  Resp: 20 19  Temp: 97.8 F (36.6 C) 98.4 F (36.9 C)  SpO2: 98% 100%    Intake/Output Summary (Last 24 hours) at 02/05/2021 1159 Last data filed at 02/05/2021 0300 Gross per 24 hour  Intake --  Output 550 ml  Net -550 ml   Last Weight  Most recent update: 02/05/2021  4:00 AM   Weight  65.7 kg (144 lb 13.5 oz)           Gen:  Frail elderly M in NAD HEENT: moist mucous membranes CV: Regular rate and rhythm  PULM: On 10LPM [place] ABD: soft/nontender  EXT: No edema  Neuro: Somnolent this morning  SUMMARY  OF RECOMMENDATIONS DNAR/DNI  Gold DNR will be placed in the front of the chart  Appreciate transitions of care team consulting Archer so that a liaison could meet with patient's daughter to better describe their services  Patient will likely need short-term SNF placement prior to going home with potential [facility] enrollment  Ongoing goals of care conversations to ensue in the oncoming days  Time Spent: 15 Greater than 50% of the time was spent in counseling and coordination of care ______________________________________________________________________________________ Addendum:  I met with patient's wife and daughter at bedside.  We reviewed Charles Archer present health condition.  Charles Archer daughter Charles Archer expresses a lot of concern and feelings of anxiety about him discharging home eventually.  Reviewed in detail safety measures that could be implemented such as caregivers, video cameras, and life alert bands to ensure that she is in the now even if not able to be present with him.    Charles Archer read up patient's anemia and whether or not there is something we can utilize to stimulate red blood cells.  I shared that concerns regarding hemoglobin will be addressed with Dr. Lupe Archer of Charles Archer was able to meet with Charles Archer and described the services of Archer through Charles Archer.  Reviewed that I will asked social work to help provide caregiver resources and information on life alert bands.  Time In: 1500 Time out: 1530 Additional Time: [address]  [place] Team Team Cell Phone: [phone] Please utilize secure chat with additional questions, if there is no response within 30 minutes please call the above phone number  Palliative Medicine Team providers are available by phone from 7am to 7pm daily and can be reached through the team cell phone.  Should this patient require assistance outside of these hours, please call the patient's  attending physician.

## 2021-02-06 DIAGNOSIS — Z515 Encounter for palliative care: Secondary | ICD-10-CM | POA: Diagnosis not present

## 2021-02-06 DIAGNOSIS — Z7189 Other specified counseling: Secondary | ICD-10-CM | POA: Diagnosis not present

## 2021-02-06 DIAGNOSIS — K922 Gastrointestinal hemorrhage, unspecified: Secondary | ICD-10-CM | POA: Diagnosis not present

## 2021-02-06 LAB — BASIC METABOLIC PANEL
Anion gap: 8 (ref 5–15)
BUN: 51 mg/dL — ABNORMAL HIGH (ref 8–23)
CO2: 36 mmol/L — ABNORMAL HIGH (ref 22–32)
Calcium: 8.5 mg/dL — ABNORMAL LOW (ref 8.9–10.3)
Chloride: 98 mmol/L (ref 98–111)
Creatinine, Ser: 1.22 mg/dL (ref 0.61–1.24)
GFR, Estimated: 59 mL/min — ABNORMAL LOW (ref >60)
Glucose, Bld: 100 mg/dL — ABNORMAL HIGH (ref 70–99)
Potassium: 4 mmol/L (ref 3.5–5.1)
Sodium: 142 mmol/L (ref 135–145)

## 2021-02-06 LAB — CBC
HCT: 29.7 % — ABNORMAL LOW (ref 39.0–52.0)
Hemoglobin: 9.2 g/dL — ABNORMAL LOW (ref 13.0–17.0)
MCH: 32.1 pg (ref 26.0–34.0)
MCHC: 31 g/dL (ref 30.0–36.0)
MCV: 103.5 fL — ABNORMAL HIGH (ref 80.0–100.0)
Platelets: 265 10*3/uL (ref 150–400)
RBC: 2.87 MIL/uL — ABNORMAL LOW (ref 4.22–5.81)
RDW: 16.1 % — ABNORMAL HIGH (ref 11.5–15.5)
WBC: 14 10*3/uL — ABNORMAL HIGH (ref 4.0–10.5)
nRBC: 0.4 % — ABNORMAL HIGH (ref 0.0–0.2)

## 2021-02-06 MED ORDER — FUROSEMIDE 20 MG PO TABS: 20.0000 mg | ORAL_TABLET | Freq: Every day | ORAL | Status: DC

## 2021-02-06 MED ORDER — SODIUM CHLORIDE 0.9 % IV BOLUS
250.0000 mL | Freq: Once | INTRAVENOUS | Status: AC
  Administered 2021-02-06: 250 mL via INTRAVENOUS

## 2021-02-06 MED ORDER — FUROSEMIDE 20 MG PO TABS
20.0000 mg | ORAL_TABLET | Freq: Every day | ORAL | Status: DC
  Administered 2021-02-07 – 2021-02-08 (×2): 20 mg via ORAL
  Filled 2021-02-06 (×2): qty 1

## 2021-02-06 NOTE — Progress Notes (Signed)
   Palliative Medicine Inpatient Follow Up Note  Reason for consult:  Goals of Care  HPI:  Per intake H&P --> Charles Archer a 85 y.o.malewith medical history significant forCOPD on 10 L of oxygen via HFNC, BPH, hypertension, hyperlipidemia, gout who presented to ED due to shortness of on exertion since Friday (4/15), he endorsed urinary incontinence while on his wheelchair on Thursday (4/14), he also noted that his stool was black.   Palliative care was asked to evaluate Charles Archer and speak to his daughter, Eustaquio Maize regarding goals of care  Today's Discussion (02/06/2021):  *Please note that this is a verbal dictation therefore any spelling or grammatical errors are due to the "[place] One" system interpretation.  Chart reviewed. Plan for PT/OT evaluations today. Patients daughter received information yesterday on life alert and caregiver resources.  I met with Charles Archer at bedside, he was noted to be resting. Appeared comfortable with no nonverbal indicators of discomfort.   Spoke with patients daughter this morning. Reviewed that she has no additional questions at this time.  MOST placed for scanning into [facility] with the following wishes:  Cardiopulmonary Resuscitation: Do Not Attempt Resuscitation (DNR/No CPR)  Medical Interventions: Limited Additional Interventions: Use medical treatment, IV fluids and cardiac monitoring as indicated, DO NOT USE intubation or mechanical ventilation. May consider use of less invasive airway support such as BiPAP or CPAP. Also provide comfort measures. Transfer to the hospital if indicated. Avoid intensive care.   Antibiotics: Antibiotics if indicated  IV Fluids: IV fluids if indicated  Feeding Tube: Feeding tube for a defined trial period   Questions and concerns addressed   Objective Assessment: Vital Signs Vitals:   02/06/21 0832 02/06/21 0833  BP:    Pulse:    Resp:    Temp:    SpO2: 100% 100%    Intake/Output Summary (Last  24 hours) at 02/06/2021 0856 Last data filed at 02/06/2021 0500 Gross per 24 hour  Intake --  Output 1300 ml  Net -1300 ml   Last Weight  Most recent update: 02/06/2021  4:04 AM   Weight  67.1 kg (147 lb 14.9 oz)           Gen:  Frail elderly M in NAD HEENT: moist mucous membranes CV: Regular rate and rhythm  PULM: On 10LPM [place] ABD: soft/nontender  EXT: No edema  Neuro: Sleeping, neuro exam not completed  SUMMARY OF RECOMMENDATIONS DNAR/DNI  MOST Completed, paper copy placed onto the chart electric copy can be found in [facility]  DNR Form Completed, paper copy placed onto the chart electric copy can be found in [facility]  Patient will likely need short-term SNF placement prior to going home with potential hospice enrollment this will depend on PT/OT evaluations  Appreciate [facility] involvement  Ongoing goals of care conversations to ensue in the oncoming days  Time Spent: 25 Greater than 50% of the time was spent in counseling and coordination of care  [place] Team Team Cell Phone: [phone] Please utilize secure chat with additional questions, if there is no response within 30 minutes please call the above phone number  Palliative Medicine Team providers are available by phone from 7am to 7pm daily and can be reached through the team cell phone.  Should this patient require assistance outside of these hours, please call the patient's attending physician.

## 2021-02-06 NOTE — Evaluation (Signed)
Occupational Therapy Evaluation Patient Details Name: Charles Archer MRN: [id number] DOB: 25-Apr-1936 Today's Date: 02/06/2021    History of Present Illness Pt is an 85 y.o. male admitted 02/10/2021 with incontinence, dark stools, lethargy, respiratory distress. Workup for acute on chronic CHF, severe hypoxic respiratory failure secondary to severe COPD, recent GIB, AKI. PMH includes HTN, COPD (O2 dependence), gout.   Clinical Impression   Pt PTA: pt lives with family and reports cared for/was mostly independent with ADL and mobility. Pt currently, Pt modA to totalA for ADL due to poor endurance and feeling fatigued with minimal exertion. Pt ModA overall for bed mobility and pt refusing to attempt transfer to recliner. Pt's eyes closed most of session and conflicting answers given to PLOF info. Pt would greatly benefit from continued OT skilled services. OT following acutely. Pt's O2 requirements increased with activity. 7L HFNC to 9L HFNC with exertion desatting to 86%.    Follow Up Recommendations  Supervision/Assistance - 24 hour;SNF;Other (comment) (?hospice/palliative)    Equipment Recommendations  None recommended by OT    Recommendations for Other Services       Precautions / Restrictions Precautions Precautions: Fall;Other (comment) Precaution Comments: Watch SpO2, BP, RR; pt reports wearing 10L O2 chronic Restrictions Weight Bearing Restrictions: No      Mobility Bed Mobility Overal bed mobility: Needs Assistance Bed Mobility: Supine to Sit;Sit to Supine     Supine to sit: Mod assist Sit to supine: Supervision   General bed mobility comments: Increased RR to 30s, eyes closed, increased time for functional tasks.    Transfers                 General transfer comment: Prolonged sitting EOB and pt adamant about attempting to stand or transfer to recliner despite max encouragement and education    Balance Overall balance assessment: Needs  assistance Sitting-balance support: No upper extremity supported;Feet supported Sitting balance-Leahy Scale: Fair Sitting balance - Comments: sitting EOB  <3 mins before lying backwards without warning                                   ADL either performed or assessed with clinical judgement   ADL Overall ADL's : Needs assistance/impaired Eating/Feeding: Moderate assistance;Bed level   Grooming: Maximal assistance;Bed level   Upper Body Bathing: Maximal assistance;Bed level   Lower Body Bathing: Total assistance;Bed level   Upper Body Dressing : Maximal assistance;Bed level;Sitting   Lower Body Dressing: Total assistance;Bed level   Toilet Transfer: Maximal assistance   Toileting- Clothing Manipulation and Hygiene: Total assistance       Functional mobility during ADLs: Moderate assistance;+2 for physical assistance;+2 for safety/equipment;Cueing for safety General ADL Comments: Pt modA to totalA for ADL due to poor endurance and feeling fatigued with minimal exertion. Pt's O2 requirements increased with activity. 7L HFNC to 9L HFNC with exertion desatting to 86%.     Vision Baseline Vision/History: Wears glasses Wears Glasses: At all times Patient Visual Report: No change from baseline Vision Assessment?: No apparent visual deficits Additional Comments: eyes closed for most of session     Perception     Praxis      Pertinent Vitals/Pain Pain Assessment: No/denies pain     Hand Dominance     Extremity/Trunk Assessment Upper Extremity Assessment Upper Extremity Assessment: Generalized weakness   Lower Extremity Assessment Lower Extremity Assessment: Generalized weakness   Cervical / Trunk Assessment Cervical /  Trunk Assessment: Kyphotic   Communication Communication Communication: HOH;Expressive difficulties (mumbled speech)   Cognition Arousal/Alertness: Awake/alert Behavior During Therapy: Flat affect Overall Cognitive Status: No  family/caregiver present to determine baseline cognitive functioning Area of Impairment: Attention;Memory;Following commands;Safety/judgement;Awareness;Problem solving                   Current Attention Level: Sustained Memory: Decreased short-term memory Following Commands: Follows one step commands with increased time;Follows one step commands inconsistently Safety/Judgement: Decreased awareness of deficits Awareness: Emergent Problem Solving: Slow processing;Decreased initiation;Requires verbal cues General Comments: Pt closing eyes and increased time to respond   General Comments  o2 requirements of 7L HFNC at rest; required 9L HFNC wit exertion. At home, pt was on 10L O2 HFNC.    Exercises     Shoulder Instructions      Home Living Family/patient expects to be discharged to:: Private residence Living Arrangements: Spouse/significant other;Children Available Help at Discharge: Family;Available 24 hours/day Type of Home: House Home Access: Stairs to enter [employer] of Steps: 1   Home Layout: One level     Bathroom Shower/Tub: Tub/shower unit         Home Equipment: Wheelchair - [employer] - 2 wheels;Cane - single point   Additional Comments: Pt initially stating no DME besides w/c; inconsistent answers, question if fully reliable historian      Prior Functioning/Environment Level of Independence: Needs assistance  Gait / Transfers Assistance Needed: Pt reports independent ambulating in house, but reliant on assist to be pushed in w/c outside of home. Pt later states he uses w/c and RW in home depending on day ADL's / Homemaking Assistance Needed: medication managment from family; reports independent with ADL; iADLs provided for him            OT Problem List: Decreased strength;Decreased activity tolerance;Impaired balance (sitting and/or standing);Decreased cognition;Decreased safety awareness;Pain;Increased edema;Cardiopulmonary status  limiting activity      OT Treatment/Interventions: Self-care/ADL training;Therapeutic exercise;Neuromuscular education;Energy conservation;DME and/or AE instruction;Therapeutic activities;Cognitive remediation/compensation;Visual/perceptual remediation/compensation;Patient/family education;Balance training    OT Goals(Current goals can be found in the care plan section) Acute Rehab OT Goals Patient Stated Goal: agreeable to rehab, but also wanting to lay down OT Goal Formulation: With patient Time For Goal Achievement: 02/20/21 Potential to Achieve Goals: Good ADL Goals Pt Will Perform Grooming: with min assist;sitting Pt Will Transfer to Toilet: with max assist;stand pivot transfer;bedside commode Pt/caregiver will Perform Home Exercise Program: Increased strength;Both right and left upper extremity;With theraband;With minimal assist;With written HEP provided Additional ADL Goal #1: Pt will increase to supervisionA for bed mobility as precursor for OOB ADL. Additional ADL Goal #2: Pt will perform x5 mins of seated ADL with 1-2 rest breaks and O2 sats >90%.  OT Frequency: Min 2X/week   Barriers to D/C:            Co-evaluation PT/OT/SLP Co-Evaluation/Treatment: Yes Reason for Co-Treatment: For patient/therapist safety;Complexity of the patient's impairments (multi-system involvement) PT goals addressed during session: Mobility/safety with mobility;Balance OT goals addressed during session: ADL's and self-care;Strengthening/ROM      AM-PAC OT "6 Clicks" Daily Activity     Outcome Measure Help from another person eating meals?: A Lot Help from another person taking care of personal grooming?: A Lot Help from another person toileting, which includes using toliet, bedpan, or urinal?: Total Help from another person bathing (including washing, rinsing, drying)?: A Lot Help from another person to put on and taking off regular upper body clothing?: A Lot Help from another person to  put  on and taking off regular lower body clothing?: Total 6 Click Score: 10   End of Session Equipment Utilized During Treatment: Oxygen Nurse Communication: Mobility status  Activity Tolerance: Patient limited by fatigue;Patient limited by lethargy Patient left: in bed;with call bell/phone within reach;with chair alarm set  OT Visit Diagnosis: Unsteadiness on feet (R26.81);Muscle weakness (generalized) (M62.81);Pain;Adult, failure to thrive (R62.7) Pain - part of body:  (generalized)                Time: 8250-5397 OT Time Calculation (min): 26 min Charges:  OT General Charges $OT Visit: 1 Visit OT Evaluation $OT Eval Moderate Complexity: 1 Mod  Flora Lipps, OTR/L Acute Rehabilitation Services Pager: [phone] Office: [phone]   Teven Mittman C 02/06/2021, 3:06 PM

## 2021-02-06 NOTE — Evaluation (Signed)
Physical Therapy Evaluation Patient Details Name: Charles Archer MRN: [id number] DOB: 1936/07/01 Today's Date: 02/06/2021   History of Present Illness  Pt is an 85 y.o. male admitted 02/10/2021 with incontinence, dark stools, lethargy, respiratory distress. Workup for acute on chronic CHF, severe hypoxic respiratory failure secondary to severe COPD, recent GIB, AKI. PMH includes HTN, COPD (O2 dependence), gout.    Clinical Impression  Pt presents with an overall decrease in functional mobility secondary to above. Question pt reliable historian; pt reports that PTA, independent with ADLs and household ambulation, pushed in w/c for community mobility; lives with family who assists with iADLs. Today, pt required max encouragement to participate, requiring modA to sit EOB and adamantly declining attempts to stand or transfer to recliner. Increased time discussing post-acute rehab at SNF versus hospice/comfort care - pt reports he wants to work with PT/OT, just not today. Question pt's ability to tolerate post-acute rehab, but will recommend for now and hope for improved participation next session. Pt limited by significant WOB with minimal activity, generalized weakness.  SpO2 94% on 7L O2 HFNC at rest Down to 80s% with activity, increased to 9L  C/o dizziness sitting; return to supine BP 90/49     Follow Up Recommendations SNF;Supervision for mobility/OOB (vs. Hospice)    Equipment Recommendations  Hospital bed;Rolling walker with 5" wheels;3in1 (PT)    Recommendations for Other Services       Precautions / Restrictions Precautions Precautions: Fall;Other (comment) Precaution Comments: Watch SpO2, BP, RR; pt reports wearing 10L O2 chronic Restrictions Weight Bearing Restrictions: No      Mobility  Bed Mobility Overal bed mobility: Needs Assistance Bed Mobility: Supine to Sit;Sit to Supine     Supine to sit: Mod assist Sit to supine: Supervision   General bed mobility comments:  Significant increased time and effort to sit EOB, pt requiring repeated cues to attend to task and for sequencing, pt would stop moving and close eyes with increased respiration rate noted; return to supine without assist    Transfers                 General transfer comment: Prolonged sitting EOB and pt adamant about attempting to stand or transfer to recliner despite max encouragement and education  Ambulation/Gait                Stairs            Wheelchair Mobility    Modified Rankin (Stroke Patients Only)       Balance Overall balance assessment: Needs assistance Sitting-balance support: No upper extremity supported;Feet supported Sitting balance-Leahy Scale: Fair Sitting balance - Comments: Stability improved once feet supported, supervision for safety                                     Pertinent Vitals/Pain Pain Assessment: No/denies pain    Home Living Family/patient expects to be discharged to:: Private residence Living Arrangements: Spouse/significant other;Children Available Help at Discharge: Family;Available 24 hours/day Type of Home: House Home Access: Stairs to enter   [employer] of Steps: 1 Home Layout: One level Home Equipment: Wheelchair - [employer] - 2 wheels;Cane - single point Additional Comments: Pt initially stating no DME besides w/c; inconsistent answers, question if fully reliable historian    Prior Function Level of Independence: Needs assistance   Gait / Transfers Assistance Needed: Pt reports independent ambulating in house, but reliant on  assist to be pushed in w/c outside of home. Pt later states he uses w/c and RW in home depending on day  ADL's / Homemaking Assistance Needed: medication managment frm family; reports independent with ADL; iADLs provided for him        Hand Dominance        Extremity/Trunk Assessment   Upper Extremity Assessment Upper Extremity Assessment:  Generalized weakness    Lower Extremity Assessment Lower Extremity Assessment: Generalized weakness    Cervical / Trunk Assessment Cervical / Trunk Assessment: Kyphotic  Communication   Communication: HOH;Expressive difficulties (soft, mumbling speech)  Cognition Arousal/Alertness: Awake/alert Behavior During Therapy: Flat affect Overall Cognitive Status: No family/caregiver present to determine baseline cognitive functioning Area of Impairment: Attention;Memory;Following commands;Safety/judgement;Awareness;Problem solving                   Current Attention Level: Sustained Memory: Decreased short-term memory Following Commands: Follows one step commands with increased time;Follows one step commands inconsistently Safety/Judgement: Decreased awareness of deficits Awareness: Emergent Problem Solving: Slow processing;Decreased initiation;Requires verbal cues General Comments: Difficult to determine true cognitive impairment vs. fatigue vs. desire to participate      General Comments General comments (skin integrity, edema, etc.): pt received on 7L O2 HFNC with SpO2 98%. Pt with significantly increased respiration rate with activity and desaturation (unsure reliable pleth down to 81%), increased to 9L O2 [place]. Pt c/o dizziness upon sitting with return to supine BP 90/49. SpO2 maintaining >/94% on 7L O2 HFNC at end of session. At end of session, discussed post-acute rehab at [facility] versus Hospice services for comfort care; pt reports wanting continued PT/OT    Exercises     Assessment/Plan    PT Assessment Patient needs continued PT services  PT Problem List Decreased strength;Decreased activity tolerance;Decreased balance;Decreased mobility;Decreased cognition;Decreased knowledge of use of DME;Decreased knowledge of precautions;Cardiopulmonary status limiting activity       PT Treatment Interventions DME instruction;Gait training;Stair training;Functional mobility  training;Therapeutic activities;Therapeutic exercise;Balance training;Patient/family education;Wheelchair mobility training    PT Goals (Current goals can be found in the Care Plan section)  Acute Rehab PT Goals Patient Stated Goal: "To lay back down"; pt ensures he wants to work on standing and walking with PT, just not today PT Goal Formulation: With patient Time For Goal Achievement: 02/20/21 Potential to Achieve Goals: Fair    Frequency Min 2X/week   Barriers to discharge        Co-evaluation PT/OT/SLP Co-Evaluation/Treatment: Yes Reason for Co-Treatment: Complexity of the patient's impairments (multi-system involvement);For patient/therapist safety;To address functional/ADL transfers;Other (comment) (poor activity tolerance) PT goals addressed during session: Mobility/safety with mobility;Balance         AM-PAC PT "6 Clicks" Mobility  Outcome Measure Help needed turning from your back to your side while in a flat bed without using bedrails?: A Lot Help needed moving from lying on your back to sitting on the side of a flat bed without using bedrails?: A Lot Help needed moving to and from a bed to a chair (including a wheelchair)?: A Lot Help needed standing up from a chair using your arms (e.g., wheelchair or bedside chair)?: A Lot Help needed to walk in hospital room?: A Lot Help needed climbing 3-5 steps with a railing? : A Lot 6 Click Score: 12    End of Session Equipment Utilized During Treatment: Oxygen Activity Tolerance: Patient limited by fatigue Patient left: in bed;with call bell/phone within reach;with bed alarm set Nurse Communication: Mobility status PT Visit Diagnosis: Other abnormalities  of gait and mobility (R26.89);Muscle weakness (generalized) (M62.81)    Time: 9147-8295 PT Time Calculation (min) (ACUTE ONLY): 25 min   Charges:   PT Evaluation $PT Eval Moderate Complexity: 1 Mod         Ina Homes, PT, DPT Acute Rehabilitation Services   Pager [phone] Office [phone]  Malachy Chamber 02/06/2021, 1:03 PM

## 2021-02-06 NOTE — Progress Notes (Signed)
PROGRESS NOTE    Charles Archer  ONG:[id number] DOB: 1936-07-28 DOA: 02/12/2021 PCP: Daisy Floro, MD   Chief Complaint  Patient presents with  . Code Sepsis   Brief Narrative: 85 year old male with complex medical history with hypertension, hyperlipidemia, chronic oxygen dependent/hypoxic respiratory failure with COPD, on 10 L of nasal cannula,  Gout presented to the ED with shortness of breath on exertion on 01/28/2021 and also was complaining of urinary incontinence while on the wheelchair on Thursday 4/14 and noted to have blackish stool, decreased appetite. EMS was called patient was lethargic sepsis was suspected on arrival to the ED and was in respiratory distress wheezing with crackles, also hypotensive needing IV fluids. Also had leukocytosis and anemia tachycardia tachypnea chest x-ray with severe emphysema no active disease COVID-19 influenza AB-.  Patient was given Solu-Medrol antibiotics for possible urosepsis and Protonix drip for GI bleed and was admitted for UTI?Marland Kitchen Antibiotics were subsequently discontinued and UTI was not found multiple etiology suspected for some mild lactic acidosis with GI bleed melena.  Seen by GI, treated with Protonix x72 hours hemoglobin remained stable deduce cardiomyopathy respiratory failure was also seen by cardiology. Given his complex medical comorbidities, poor respiratory status palliative care was consulted. Palliative care has seen the patient plan is for DNR/DNI [facility] consulted for hospice evaluation, may likely need short-term SNF placement prior to going home with a potential hospice enrollment as per palliative.  Subjective: Seen this morning.  Reports he feels less short of breath today.  His oxygen is down to 7 L.   Mild congestion.   Renal function appears to be better today. Assessment & Plan:  Acute on chronic combined systolic/diastolic CHF: Seen by cardiology, treated with IV diuresis.  Lasix held due to worsening renal function  but creatinine stable today we will resume Lasix at 20 mg daily.   Cardiomyopathy continue low-dose beta-blocker no ACE inhibitor or ARB or Entresto due to worsening renal function and borderline blood pressure.  Palliative care to continue.  Chronic hypoxic respiratory failure on 10 L nasal cannula Severe COPD Oxygen needs down to 7 L, continue pulmonary toileting bronchodilators.  Palliative care is following plan is for short-term SNF then home with hospice.  Resuming oral Lasix.  Continue supportive care Mucinex prednisone.    Recent GI bleeding hemoglobin is stable, GI has seen the patient not pursuing scope given his poor respiratory status  Anemia likely from chronic disease recent GI bleeding hemoglobin is stable uptrending.  Add iron supplementation upon discharge. Recent Labs  Lab 02/02/21 1241 02/02/21 2238 02/03/21 0220 02/04/21 0312 02/06/21 0159  HGB 8.1* 8.5* 8.6* 8.8* 9.2*  HCT 25.0* 26.0* 26.8* 27.6* 29.7*   AKI BUN/creatinine uptrending Lasix was discontinued.  Now creatinine at 1.2 resume Lasix at 20 mg daily.   Recent Labs  Lab 02/02/21 2238 02/03/21 0220 02/04/21 0312 02/05/21 0212 02/06/21 0159  BUN 27* 28* 44* 55* 51*  CREATININE 1.08 1.07 1.17 1.44* 1.22   Mild depression moderately stable, Remeron was added  Severe protein calorie malnutrition augment dietitian dietitian on board.    Leukocytosis/hyperglycemia likely in the setting of steroid use.  Monitor. Recent Labs  Lab 02/02/21 1241 02/02/21 2238 02/03/21 0220 02/04/21 0312 02/06/21 0159  WBC 11.7* 12.4* 14.3* 12.7* 14.0*   Hypertension blood pressure is stable.  Atrial tachycardia up to 150s on the morning of 4/21.  Continue current beta-blocker.  Heart rate is stable  BPH on Flomax  Gout: Stable on allopurinol  Goals  of care: Palliative care input appreciated patient is DNR/DNI, at risk of decompensation overall poor prognosis does not appear bright.  Patient family looking for  short-term SNF then home with hospice.  May be difficult to get dyspneic given high oxygen need.   Diet Order            DIET SOFT Room service appropriate? No; Fluid consistency: Thin  Diet effective now                 Nutrition Problem: Severe Malnutrition Etiology: chronic illness (COPD) Signs/Symptoms: severe fat depletion,severe muscle depletion,percent weight loss Percent weight loss: 13.1 % Interventions: Refer to RD note for recommendations,Boost Breeze,MVI,Prostat Patient's Body mass index is 20.63 kg/m.  DVT prophylaxis: SCDs Start: Feb 19, 2021 2010 Code Status:   Code Status: DNR  Family Communication: plan of care discussed with patient at bedside.  Status is: Inpatient Remains inpatient appropriate because:Inpatient level of care appropriate due to severity of illness  Dispo: The patient is from: Home              Anticipated d/c is to: SNF              Patient currently is medically stable, SNF once available.   Difficult to place patient No Unresulted Labs (From admission, onward)         None       Medications reviewed:  Scheduled Meds: . (feeding supplement) PROSource Plus  30 mL Oral BID BM  . sodium chloride   Intravenous Once  . allopurinol  100 mg Oral Daily  . aspirin  81 mg Oral Daily  . bisoprolol  2.5 mg Oral Daily  . budesonide  0.5 mg Nebulization BID  . calcium carbonate  625 mg Oral Daily  . dextromethorphan-guaiFENesin  1 tablet Oral BID  . feeding supplement  237 mL Oral TID BM  . levalbuterol  1.25 mg Nebulization BID  . mirtazapine  15 mg Oral QHS  . multivitamin with minerals  1 tablet Oral Daily  . niacin  250 mg Oral QHS  . pantoprazole  40 mg Oral BID  . predniSONE  10 mg Oral Q breakfast  . tamsulosin  0.4 mg Oral Daily  . vitamin B-12  1,000 mcg Oral Daily   Continuous Infusions:  Consultants:see note  Procedures:see note  Antimicrobials: Anti-infectives (From admission, onward)   Start     Dose/Rate Route Frequency  Ordered Stop   01/31/21 0800  cefTRIAXone (ROCEPHIN) 1 g in sodium chloride 0.9 % 100 mL IVPB  Status:  Discontinued        1 g 200 mL/hr over 30 Minutes Intravenous Every 24 hours 02-19-21 2049 01/31/21 1023   2021-02-19 1645  cefTRIAXone (ROCEPHIN) 1 g in sodium chloride 0.9 % 100 mL IVPB        1 g 200 mL/hr over 30 Minutes Intravenous  Once 02/19/2021 1644 2021-02-19 1745     Culture/Microbiology    Component Value Date/Time   SDES BLOOD RIGHT ANTECUBITAL 2021-02-19 1630   SPECREQUEST  2021/02/19 1630    BOTTLES DRAWN AEROBIC AND ANAEROBIC Blood Culture results may not be optimal due to an inadequate volume of blood received in culture bottles   CULT  19-Feb-2021 1630    NO GROWTH 5 DAYS Performed at [facility], [address], [place], [place] [address]    REPTSTATUS 02/04/2021 FINAL 02/19/2021 1630    Other culture-see note  Objective: Vitals: Today's Vitals   02/06/21 0759 02/06/21  0804 02/06/21 0832 02/06/21 0833  BP: (!) 115/52 (!) 142/71    Pulse: 96     Resp:      Temp: (!) 97.5 F (36.4 C) 98.4 F (36.9 C)    TempSrc: Oral Oral    SpO2:   100% 100%  Weight:      Height:      PainSc:  Asleep      Intake/Output Summary (Last 24 hours) at 02/06/2021 1116 Last data filed at 02/06/2021 0500 Gross per 24 hour  Intake --  Output 1300 ml  Net -1300 ml   Filed Weights   02/03/21 1255 02/05/21 0355 02/06/21 0401  Weight: 69.3 kg 65.7 kg 67.1 kg   Weight change: 1.4 kg  Intake/Output from previous day: 04/23 0701 - 04/24 0700 In: -  Out: 1300 [Urine:1300] Intake/Output this shift: No intake/output data recorded. Filed Weights   02/03/21 1255 02/05/21 0355 02/06/21 0401  Weight: 69.3 kg 65.7 kg 67.1 kg    Examination: General exam: AAOx3, older than his stated age, on high flow nasal cannula 7 L, weak appearing. HEENT:Oral mucosa moist, Ear/Nose WNL grossly, dentition normal. Respiratory system: bilaterally diminished,no wheezing or crackles,no use of  accessory muscle Cardiovascular system: S1 & S2 +, No JVD,. Gastrointestinal system: Abdomen soft, NT,ND, BS+ Nervous System:Alert, awake, moving extremities and grossly nonfocal Extremities: No edema, distal peripheral pulses palpable.  Skin: No rashes,no icterus. MSK: Normal muscle bulk,tone, power  Data Reviewed: I have personally reviewed following labs and imaging studies CBC: Recent Labs  Lab 01/31/21 0842 01/31/21 1811 02/01/21 0346 02/01/21 1619 02/02/21 1241 02/02/21 2238 02/03/21 0220 02/04/21 0312 02/06/21 0159  WBC 12.2* 12.1* 14.9* 14.6* 11.7* 12.4* 14.3* 12.7* 14.0*  NEUTROABS 10.2* 9.8* 12.3* 12.3*  --  9.5*  --   --   --   HGB 10.4* 9.8* 8.5* 8.9* 8.1* 8.5* 8.6* 8.8* 9.2*  HCT 31.1* 30.2* 26.0* 27.7* 25.0* 26.0* 26.8* 27.6* 29.7*  MCV 100.6* 100.7* 101.2* 103.0* 101.6* 100.8* 101.9* 103.0* 103.5*  PLT 168 177 158 183 148* 166 166 200 265   Basic Metabolic Panel: Recent Labs  Lab 01/31/21 0334 02/01/21 0346 02/02/21 0254 02/02/21 2238 02/03/21 0220 02/04/21 0312 02/05/21 0212 02/06/21 0159  NA 140 141   < > 138 139 139 140 142  K 5.2* 5.3*   < > 4.1 4.2 3.8 4.2 4.0  CL 108 109   < > 98 102 99 98 98  CO2 26 26   < > 30 28 30  32 36*  GLUCOSE 145* 117*   < > 97 102* 106* 142* 100*  BUN 53* 40*   < > 27* 28* 44* 55* 51*  CREATININE 1.27* 1.07   < > 1.08 1.07 1.17 1.44* 1.22  CALCIUM 8.5* 8.1*   < > 8.2* 8.4* 8.4* 8.6* 8.5*  MG 2.2 2.1  --  2.0  --   --   --   --   PHOS 4.8*  --   --  2.1*  --   --   --   --    < > = values in this interval not displayed.   GFR: Estimated Creatinine Clearance: 43.5 mL/min (by C-G formula based on SCr of 1.22 mg/dL). Liver Function Tests: Recent Labs  Lab 01/14/2021 1625 01/31/21 0334 02/02/21 2238  AST 41 51*  --   ALT 37 46*  --   ALKPHOS 52 61  --   BILITOT 0.6 0.7  --   PROT 4.8* 5.8*  --  ALBUMIN 2.5* 3.1* 2.6*   No results for input(s): LIPASE, AMYLASE in the last 168 hours. No results for input(s):  AMMONIA in the last 168 hours. Coagulation Profile: Recent Labs  Lab February 01, 2021 1625 01/31/21 0334  INR 1.2 1.1   Cardiac Enzymes: No results for input(s): CKTOTAL, CKMB, CKMBINDEX, TROPONINI in the last 168 hours. BNP (last 3 results) No results for input(s): PROBNP in the last 8760 hours. HbA1C: No results for input(s): HGBA1C in the last 72 hours. CBG: No results for input(s): GLUCAP in the last 168 hours. Lipid Profile: No results for input(s): CHOL, HDL, LDLCALC, TRIG, CHOLHDL, LDLDIRECT in the last 72 hours. Thyroid Function Tests: No results for input(s): TSH, T4TOTAL, FREET4, T3FREE, THYROIDAB in the last 72 hours. Anemia Panel: No results for input(s): VITAMINB12, FOLATE, FERRITIN, TIBC, IRON, RETICCTPCT in the last 72 hours. Sepsis Labs: Recent Labs  Lab Feb 01, 2021 1625 02-01-21 1825 01/31/21 0334 01/31/21 0842  PROCALCITON  --   --   --  0.14  LATICACIDVEN 2.7* 2.4* 1.8  --     Recent Results (from the past 240 hour(s))  Resp Panel by RT-PCR (Flu A&B, Covid) Nasopharyngeal Swab     Status: None   Collection Time: 02-01-21  4:25 PM   Specimen: Nasopharyngeal Swab; Nasopharyngeal(NP) swabs in vial transport medium  Result Value Ref Range Status   SARS Coronavirus 2 by RT PCR NEGATIVE NEGATIVE Final    Comment: (NOTE) SARS-CoV-2 target nucleic acids are NOT DETECTED.  The SARS-CoV-2 RNA is generally detectable in upper respiratory specimens during the acute phase of infection. The lowest concentration of SARS-CoV-2 viral copies this assay can detect is 138 copies/mL. A negative result does not preclude SARS-Cov-2 infection and should not be used as the sole basis for treatment or other patient management decisions. A negative result may occur with  improper specimen collection/handling, submission of specimen other than nasopharyngeal swab, presence of viral mutation(s) within the areas targeted by this assay, and inadequate number of viral copies(<138  copies/mL). A negative result must be combined with clinical observations, patient history, and epidemiological information. The expected result is Negative.  Fact Sheet for Patients:  BloggerCourse.com  Fact Sheet for Healthcare Providers:  SeriousBroker.it  This test is no t yet approved or cleared by the [place] FDA and  has been authorized for detection and/or diagnosis of SARS-CoV-2 by FDA under an Emergency Use Authorization (EUA). This EUA will remain  in effect (meaning this test can be used) for the duration of the COVID-19 declaration under Section 564(b)(1) of the Act, 21 U.S.C.section 360bbb-3(b)(1), unless the authorization is terminated  or revoked sooner.       Influenza A by PCR NEGATIVE NEGATIVE Final   Influenza B by PCR NEGATIVE NEGATIVE Final    Comment: (NOTE) The Xpert Xpress SARS-CoV-2/FLU/RSV plus assay is intended as an aid in the diagnosis of influenza from Nasopharyngeal swab specimens and should not be used as a sole basis for treatment. Nasal washings and aspirates are unacceptable for Xpert Xpress SARS-CoV-2/FLU/RSV testing.  Fact Sheet for Patients: BloggerCourse.com  Fact Sheet for Healthcare Providers: SeriousBroker.it  This test is not yet approved or cleared by the [place] FDA and has been authorized for detection and/or diagnosis of SARS-CoV-2 by FDA under an Emergency Use Authorization (EUA). This EUA will remain in effect (meaning this test can be used) for the duration of the COVID-19 declaration under Section 564(b)(1) of the Act, 21 U.S.C. section 360bbb-3(b)(1), unless the authorization is terminated or  revoked.  Performed at [facility], [address] [place], [place] [address]   Blood Culture (routine x 2)     Status: None   Collection Time: Nov 10, 2020  4:25 PM   Specimen: BLOOD  Result Value Ref Range Status    Specimen Description BLOOD LEFT ANTECUBITAL  Final   Special Requests   Final    BOTTLES DRAWN AEROBIC AND ANAEROBIC Blood Culture results may not be optimal due to an inadequate volume of blood received in culture bottles   Culture   Final    NO GROWTH 5 DAYS Performed at [facility], [address] [place], [place] [address]    Report Status 02/04/2021 FINAL  Final  Blood Culture (routine x 2)     Status: None   Collection Time: Nov 10, 2020  4:30 PM   Specimen: BLOOD  Result Value Ref Range Status   Specimen Description BLOOD RIGHT ANTECUBITAL  Final   Special Requests   Final    BOTTLES DRAWN AEROBIC AND ANAEROBIC Blood Culture results may not be optimal due to an inadequate volume of blood received in culture bottles   Culture   Final    NO GROWTH 5 DAYS Performed at [facility], [address] [place], [place] [address]    Report Status 02/04/2021 FINAL  Final     Radiology Studies: No results found.   LOS: 7 days   Charles Boastamesh Jhostin Epps, MD [facility]  02/06/2021, 11:16 AM

## 2021-02-07 DIAGNOSIS — K922 Gastrointestinal hemorrhage, unspecified: Secondary | ICD-10-CM | POA: Diagnosis not present

## 2021-02-07 DIAGNOSIS — J438 Other emphysema: Secondary | ICD-10-CM | POA: Diagnosis not present

## 2021-02-07 DIAGNOSIS — J9611 Chronic respiratory failure with hypoxia: Secondary | ICD-10-CM | POA: Diagnosis not present

## 2021-02-07 DIAGNOSIS — I5043 Acute on chronic combined systolic (congestive) and diastolic (congestive) heart failure: Secondary | ICD-10-CM | POA: Diagnosis not present

## 2021-02-07 LAB — BASIC METABOLIC PANEL
Anion gap: 10 (ref 5–15)
BUN: 44 mg/dL — ABNORMAL HIGH (ref 8–23)
CO2: 33 mmol/L — ABNORMAL HIGH (ref 22–32)
Calcium: 8.5 mg/dL — ABNORMAL LOW (ref 8.9–10.3)
Chloride: 98 mmol/L (ref 98–111)
Creatinine, Ser: 1.26 mg/dL — ABNORMAL HIGH (ref 0.61–1.24)
GFR, Estimated: 57 mL/min — ABNORMAL LOW (ref >60)
Glucose, Bld: 150 mg/dL — ABNORMAL HIGH (ref 70–99)
Potassium: 4 mmol/L (ref 3.5–5.1)
Sodium: 141 mmol/L (ref 135–145)

## 2021-02-07 LAB — HEMOGLOBIN AND HEMATOCRIT, BLOOD
HCT: 30.8 % — ABNORMAL LOW (ref 39.0–52.0)
Hemoglobin: 9.4 g/dL — ABNORMAL LOW (ref 13.0–17.0)

## 2021-02-07 NOTE — Progress Notes (Signed)
PROGRESS NOTE    Charles Archer  JFH:[id number] DOB: Aug 07, 1936 DOA: 02/07/2021 PCP: Daisy Floro, MD   Chief Complaint  Patient presents with  . Code Sepsis   Brief Narrative: 85 year old male with complex medical history with hypertension, hyperlipidemia, chronic oxygen dependent/hypoxic respiratory failure with COPD, on 10 L of nasal cannula,  Gout presented to the ED with shortness of breath on exertion on 01/28/2021 and also was complaining of urinary incontinence while on the wheelchair on Thursday 4/14 and noted to have blackish stool, decreased appetite. EMS was called patient was lethargic sepsis was suspected on arrival to the ED and was in respiratory distress wheezing with crackles, also hypotensive needing IV fluids. Also had leukocytosis and anemia tachycardia tachypnea chest x-ray with severe emphysema no active disease COVID-19 influenza AB-.  Patient was given Solu-Medrol antibiotics for possible urosepsis and Protonix drip for GI bleed and was admitted for UTI?Marland Kitchen Antibiotics were subsequently discontinued and UTI was not found multiple etiology suspected for some mild lactic acidosis with GI bleed melena.  Seen by GI, treated with Protonix x72 hours hemoglobin remained stable deduce cardiomyopathy respiratory failure was also seen by cardiology. Given his complex medical comorbidities, poor respiratory status palliative care was consulted. Palliative care has seen the patient plan is for DNR/DNI [facility] consulted for hospice evaluation, may likely need short-term SNF placement prior to going home with a potential hospice enrollment as per palliative.  Subjective: Seen this morning.  Some dark stool Last h/h 9.2 Baseline shortness of breath unchanged oxygen is at 7 L  Assessment & Plan:  Acute on chronic combined systolic/diastolic CHF: Seen by cardiology, treated with IV diuresis.  Lasix held due to worsening renal function but creatinine overall stable, on low dsoe lasix  po 20 mg. Bmp in am.  Cardiomyopathy continue low-dose beta-blocker no ACE inhibitor or ARB or Entresto due to worsening renal function and borderline blood pressure. Palliative care to continue.  Soft blood pressure/hypotension patient needed to 250 ml bolus 4/24-blood pressure stable this morning. Lasix was held yesterday.  Monitor.  Chronic hypoxic respiratory failure on 10 L nasal cannula at baseline Severe COPD Currently on 7 L nasal cannula.  Has baseline shortness of breath.  Continue on current bronchodilators and received pulmonary toileting, PT OT, p.o. Lasix and short-term skilled nursing facility if able to find 1, cont prednisone.   Recent GI bleeding hemoglobin is stable last H&H 9.2 g.  Reported dark BM.  Check H&H today. GI has seen the patient not pursuing scope given his poor respiratory status.  Anemia likely from chronic disease recent GI bleeding hemoglobin is stable.Add iron supplementation upon discharge. Check h/h. Recent Labs  Lab 02/02/21 1241 02/02/21 2238 02/03/21 0220 02/04/21 0312 02/06/21 0159  HGB 8.1* 8.5* 8.6* 8.8* 9.2*  HCT 25.0* 26.0* 26.8* 27.6* 29.7*   AKI BUN/creatinine at 1.22.  On Lasix 20 mg.  Repeat BMP in AM.   Recent Labs  Lab 02/02/21 2238 02/03/21 0220 02/04/21 0312 02/05/21 0212 02/06/21 0159  BUN 27* 28* 44* 55* 51*  CREATININE 1.08 1.07 1.17 1.44* 1.22   Mild depression stable on Remeron.    Severe protein calorie malnutrition augment diet.  With supplementation.  Leukocytosis/hyperglycemia likely in the setting of steroid use.  Monitor.  Patient is afebrile. Recent Labs  Lab 02/02/21 1241 02/02/21 2238 02/03/21 0220 02/04/21 0312 02/06/21 0159  WBC 11.7* 12.4* 14.3* 12.7* 14.0*   Atrial tachycardia up to 150s on the morning of 4/21.  Continue current  beta-blocker.  Heart rate is stable  BPH on Flomax  Gout: Stable on allopurinol  Goals of care: Palliative care input appreciated patient is DNR/DNI, at risk of  decompensation overall  prognosis does not appear bright.  Patient family looking for short-term SNF then home with hospice.  May be difficult to get a skilled nursing facility given high oxygen need.   labs ordered for today.  Diet Order            DIET SOFT Room service appropriate? No; Fluid consistency: Thin  Diet effective now                 Nutrition Problem: Severe Malnutrition Etiology: chronic illness (COPD) Signs/Symptoms: severe fat depletion,severe muscle depletion,percent weight loss Percent weight loss: 13.1 % Interventions: Refer to RD note for recommendations,Boost Breeze,MVI,Prostat Patient's Body mass index is 20.63 kg/m.  DVT prophylaxis: SCDs Start: 01/26/2021 2010 Code Status:   Code Status: DNR  Family Communication: plan of care discussed with patient at bedside.  Status is: Inpatient Remains inpatient appropriate because:Inpatient level of care appropriate due to severity of illness  Dispo: The patient is from: Home              Anticipated d/c is to: SNF              Patient currently is medically stable, SNF once available.   Difficult to place patient No Unresulted Labs (From admission, onward)         None       Medications reviewed:  Scheduled Meds: . (feeding supplement) PROSource Plus  30 mL Oral BID BM  . sodium chloride   Intravenous Once  . allopurinol  100 mg Oral Daily  . aspirin  81 mg Oral Daily  . bisoprolol  2.5 mg Oral Daily  . budesonide  0.5 mg Nebulization BID  . calcium carbonate  625 mg Oral Daily  . dextromethorphan-guaiFENesin  1 tablet Oral BID  . feeding supplement  237 mL Oral TID BM  . furosemide  20 mg Oral Daily  . levalbuterol  1.25 mg Nebulization BID  . mirtazapine  15 mg Oral QHS  . multivitamin with minerals  1 tablet Oral Daily  . niacin  250 mg Oral QHS  . pantoprazole  40 mg Oral BID  . predniSONE  10 mg Oral Q breakfast  . tamsulosin  0.4 mg Oral Daily  . vitamin B-12  1,000 mcg Oral Daily    Continuous Infusions:  Consultants:see note  Procedures:see note  Antimicrobials: Anti-infectives (From admission, onward)   Start     Dose/Rate Route Frequency Ordered Stop   01/31/21 0800  cefTRIAXone (ROCEPHIN) 1 g in sodium chloride 0.9 % 100 mL IVPB  Status:  Discontinued        1 g 200 mL/hr over 30 Minutes Intravenous Every 24 hours 01/14/2021 2049 01/31/21 1023   01/24/2021 1645  cefTRIAXone (ROCEPHIN) 1 g in sodium chloride 0.9 % 100 mL IVPB        1 g 200 mL/hr over 30 Minutes Intravenous  Once 01/20/2021 1644 01/27/2021 1745     Culture/Microbiology    Component Value Date/Time   SDES BLOOD RIGHT ANTECUBITAL 01/23/2021 1630   SPECREQUEST  01/28/2021 1630    BOTTLES DRAWN AEROBIC AND ANAEROBIC Blood Culture results may not be optimal due to an inadequate volume of blood received in culture bottles   CULT  01/25/2021 1630    NO GROWTH 5 DAYS Performed at [facility]  Hospital Lab, [address] [place], [place] [address]    REPTSTATUS 02/04/2021 FINAL 01/31/2021 1630    Other culture-see note  Objective: Vitals: Today's Vitals   02/07/21 0432 02/07/21 0819 02/07/21 0833 02/07/21 0903  BP: 99/62   (!) 108/47  Pulse: 73     Resp: Temp: 98.3 F (36.8 C)  98.7 F (37.1 C)   TempSrc: Oral  Oral Oral  SpO2: 90% 93%  92%  Weight:      Height:      PainSc:        Intake/Output Summary (Last 24 hours) at 02/07/2021 1036 Last data filed at 02/07/2021 0450 Gross per 24 hour  Intake 681.83 ml  Output 725 ml  Net -43.17 ml   Filed Weights   02/03/21 1255 02/05/21 0355 02/06/21 0401  Weight: 69.3 kg 65.7 kg 67.1 kg   Weight change:   Intake/Output from previous day: 04/24 0701 - 04/25 0700 In: 681.8 [P.O.:400; I.V.:31.8; IV Piggyback:250] Out: 725 [Urine:725] Intake/Output this shift: No intake/output data recorded. Filed Weights   02/03/21 1255 02/05/21 0355 02/06/21 0401  Weight: 69.3 kg 65.7 kg 67.1 kg    Examination: General exam: AAO, weak, NAD,  weak appearing. HEENT:Oral mucosa moist, Ear/Nose WNL grossly, dentition normal. Respiratory system: bilaterally diminished,no wheezing or crackles,no use of accessory muscle Cardiovascular system: S1 & S2 +, No JVD,. Gastrointestinal system: Abdomen soft, NT,ND, BS+ Nervous System:Alert, awake, moving extremities and grossly nonfocal Extremities: No edema, distal peripheral pulses palpable.  Skin: No rashes,no icterus. MSK: Normal muscle bulk,tone, power  Data Reviewed: I have personally reviewed following labs and imaging studies CBC: Recent Labs  Lab 01/31/21 1811 02/01/21 0346 02/01/21 1619 02/02/21 1241 02/02/21 2238 02/03/21 0220 02/04/21 0312 02/06/21 0159  WBC 12.1* 14.9* 14.6* 11.7* 12.4* 14.3* 12.7* 14.0*  NEUTROABS 9.8* 12.3* 12.3*  --  9.5*  --   --   --   HGB 9.8* 8.5* 8.9* 8.1* 8.5* 8.6* 8.8* 9.2*  HCT 30.2* 26.0* 27.7* 25.0* 26.0* 26.8* 27.6* 29.7*  MCV 100.7* 101.2* 103.0* 101.6* 100.8* 101.9* 103.0* 103.5*  PLT 177 158 183 148* 166 166 200 265   Basic Metabolic Panel: Recent Labs  Lab 02/01/21 0346 02/02/21 0254 02/02/21 2238 02/03/21 0220 02/04/21 0312 02/05/21 0212 02/06/21 0159  NA 141   < > 138 139 139 140 142  K 5.3*   < > 4.1 4.2 3.8 4.2 4.0  CL 109   < > 98 102 99 98 98  CO2 26   < > 32 36*  GLUCOSE 117*   < > 97 102* 106* 142* 100*  BUN 40*   < > 27* 28* 44* 55* 51*  CREATININE 1.07   < > 1.08 1.07 1.17 1.44* 1.22  CALCIUM 8.1*   < > 8.2* 8.4* 8.4* 8.6* 8.5*  MG 2.1  --  2.0  --   --   --   --   PHOS  --   --  2.1*  --   --   --   --    < > = values in this interval not displayed.   GFR: Estimated Creatinine Clearance: 43.5 mL/min (by C-G formula based on SCr of 1.22 mg/dL). Liver Function Tests: Recent Labs  Lab 02/02/21 2238  ALBUMIN 2.6*   No results for input(s): LIPASE, AMYLASE in the last 168 hours. No results for input(s): AMMONIA in the last 168 hours. Coagulation Profile: No results for input(s):  INR, PROTIME in  the last 168 hours. Cardiac Enzymes: No results for input(s): CKTOTAL, CKMB, CKMBINDEX, TROPONINI in the last 168 hours. BNP (last 3 results) No results for input(s): PROBNP in the last 8760 hours. HbA1C: No results for input(s): HGBA1C in the last 72 hours. CBG: No results for input(s): GLUCAP in the last 168 hours. Lipid Profile: No results for input(s): CHOL, HDL, LDLCALC, TRIG, CHOLHDL, LDLDIRECT in the last 72 hours. Thyroid Function Tests: No results for input(s): TSH, T4TOTAL, FREET4, T3FREE, THYROIDAB in the last 72 hours. Anemia Panel: No results for input(s): VITAMINB12, FOLATE, FERRITIN, TIBC, IRON, RETICCTPCT in the last 72 hours. Sepsis Labs: No results for input(s): PROCALCITON, LATICACIDVEN in the last 168 hours.  Recent Results (from the past 240 hour(s))  Resp Panel by RT-PCR (Flu A&B, Covid) Nasopharyngeal Swab     Status: None   Collection Time: 19-Feb-2021  4:25 PM   Specimen: Nasopharyngeal Swab; Nasopharyngeal(NP) swabs in vial transport medium  Result Value Ref Range Status   SARS Coronavirus 2 by RT PCR NEGATIVE NEGATIVE Final    Comment: (NOTE) SARS-CoV-2 target nucleic acids are NOT DETECTED.  The SARS-CoV-2 RNA is generally detectable in upper respiratory specimens during the acute phase of infection. The lowest concentration of SARS-CoV-2 viral copies this assay can detect is 138 copies/mL. A negative result does not preclude SARS-Cov-2 infection and should not be used as the sole basis for treatment or other patient management decisions. A negative result may occur with  improper specimen collection/handling, submission of specimen other than nasopharyngeal swab, presence of viral mutation(s) within the areas targeted by this assay, and inadequate number of viral copies(<138 copies/mL). A negative result must be combined with clinical observations, patient history, and epidemiological information. The expected result is Negative.  Fact Sheet for  Patients:  BloggerCourse.com  Fact Sheet for Healthcare Providers:  SeriousBroker.it  This test is no t yet approved or cleared by the [place] FDA and  has been authorized for detection and/or diagnosis of SARS-CoV-2 by FDA under an Emergency Use Authorization (EUA). This EUA will remain  in effect (meaning this test can be used) for the duration of the COVID-19 declaration under Section 564(b)(1) of the Act, 21 U.S.C.section 360bbb-3(b)(1), unless the authorization is terminated  or revoked sooner.       Influenza A by PCR NEGATIVE NEGATIVE Final   Influenza B by PCR NEGATIVE NEGATIVE Final    Comment: (NOTE) The Xpert Xpress SARS-CoV-2/FLU/RSV plus assay is intended as an aid in the diagnosis of influenza from Nasopharyngeal swab specimens and should not be used as a sole basis for treatment. Nasal washings and aspirates are unacceptable for Xpert Xpress SARS-CoV-2/FLU/RSV testing.  Fact Sheet for Patients: BloggerCourse.com  Fact Sheet for Healthcare Providers: SeriousBroker.it  This test is not yet approved or cleared by the [place] FDA and has been authorized for detection and/or diagnosis of SARS-CoV-2 by FDA under an Emergency Use Authorization (EUA). This EUA will remain in effect (meaning this test can be used) for the duration of the COVID-19 declaration under Section 564(b)(1) of the Act, 21 U.S.C. section 360bbb-3(b)(1), unless the authorization is terminated or revoked.  Performed at [facility], [address] [place], [place] [address]   Blood Culture (routine x 2)     Status: None   Collection Time: Feb 19, 2021  4:25 PM   Specimen: BLOOD  Result Value Ref Range Status   Specimen Description BLOOD LEFT ANTECUBITAL  Final   Special Requests  Final    BOTTLES DRAWN AEROBIC AND ANAEROBIC Blood Culture results may not be optimal due to an  inadequate volume of blood received in culture bottles   Culture   Final    NO GROWTH 5 DAYS Performed at [facility], [address] [place], [place] [address]    Report Status 02/04/2021 FINAL  Final  Blood Culture (routine x 2)     Status: None   Collection Time: 25-Nov-2020  4:30 PM   Specimen: BLOOD  Result Value Ref Range Status   Specimen Description BLOOD RIGHT ANTECUBITAL  Final   Special Requests   Final    BOTTLES DRAWN AEROBIC AND ANAEROBIC Blood Culture results may not be optimal due to an inadequate volume of blood received in culture bottles   Culture   Final    NO GROWTH 5 DAYS Performed at [facility], [address] [place], [place] [address]    Report Status 02/04/2021 FINAL  Final     Radiology Studies: No results found.   LOS: 8 days   Lanae Boastamesh Dmari Schubring, MD [facility]  02/07/2021, 10:36 AM

## 2021-02-07 NOTE — Progress Notes (Signed)
Civil engineer, contracting [facility])  ACC liaisons will continue to follow to assist as needed with discharge planning for hospice or palliative care.    Thank you for the opportunity to participate in this pt's care.  Gillian Scarce, BSN, RN [employer] [phone] (24h on call)

## 2021-02-07 NOTE — Progress Notes (Signed)
Progress Note  Patient Name: Charles Archer Date of Encounter: 02/07/2021  [facility] Cardiologist: Parke Poisson, MD   Subjective   Stable shortness of breath - palliative care following. Likely plans for SNF with hospice. Note to have dark bowel movement today with blood streaks. Minimal net negative -weight climbing.  Inpatient Medications    Scheduled Meds: . (feeding supplement) PROSource Plus  30 mL Oral BID BM  . sodium chloride   Intravenous Once  . allopurinol  100 mg Oral Daily  . aspirin  81 mg Oral Daily  . bisoprolol  2.5 mg Oral Daily  . budesonide  0.5 mg Nebulization BID  . calcium carbonate  625 mg Oral Daily  . dextromethorphan-guaiFENesin  1 tablet Oral BID  . feeding supplement  237 mL Oral TID BM  . furosemide  20 mg Oral Daily  . levalbuterol  1.25 mg Nebulization BID  . mirtazapine  15 mg Oral QHS  . multivitamin with minerals  1 tablet Oral Daily  . niacin  250 mg Oral QHS  . pantoprazole  40 mg Oral BID  . predniSONE  10 mg Oral Q breakfast  . tamsulosin  0.4 mg Oral Daily  . vitamin B-12  1,000 mcg Oral Daily   Continuous Infusions:  PRN Meds: acetaminophen, albuterol, docusate sodium, levalbuterol   Vital Signs    Vitals:   02/06/21 2347 02/07/21 0108 02/07/21 0432 02/07/21 0819  BP: 118/69  99/62   Pulse: 71  73   Resp: 18  17   Temp: 98 F (36.7 C)  98.3 F (36.8 C)   TempSrc: Oral  Oral   SpO2: 100% 99% 90% 93%  Weight:      Height:        Intake/Output Summary (Last 24 hours) at 02/07/2021 0847 Last data filed at 02/07/2021 0450 Gross per 24 hour  Intake 681.83 ml  Output 725 ml  Net -43.17 ml   Last 3 Weights 02/06/2021 02/05/2021 02/03/2021  Weight (lbs) 147 lb 14.9 oz 144 lb 13.5 oz 152 lb 12.5 oz  Weight (kg) 67.1 kg 65.7 kg 69.3 kg      Telemetry    Sinus to sinus tachycardia with PAC's and PVC's, grouped beating- Personally Reviewed  Physical Exam   General appearance: alert and no distress Neck: no  carotid bruit, no JVD and thyroid not enlarged, symmetric, no tenderness/mass/nodules Lungs: diminished breath sounds bilaterally and rhonchi bilaterally Heart: irregularly irregular rhythm Extremities: extremities normal, atraumatic, no cyanosis or edema Neurologic: Grossly normal Psych: Pleasant   Labs    High Sensitivity Troponin:   Recent Labs  Lab 01/31/2021 1625 02/07/2021 1826 01/31/21 0334  TROPONINIHS 36* 37* 60*      Chemistry Recent Labs  Lab 02/02/21 2238 02/03/21 0220 02/04/21 0312 02/05/21 0212 02/06/21 0159  NA 138   < > 139 140 142  K 4.1   < > 3.8 4.2 4.0  CL 98   < > 99 98 98  CO2 30   < > 30 32 36*  GLUCOSE 97   < > 106* 142* 100*  BUN 27*   < > 44* 55* 51*  CREATININE 1.08   < > 1.17 1.44* 1.22  CALCIUM 8.2*   < > 8.4* 8.6* 8.5*  ALBUMIN 2.6*  --   --   --   --   GFRNONAA >60   < > >60 48* 59*  ANIONGAP 10   < > 10 10 8    < > =  values in this interval not displayed.     Hematology Recent Labs  Lab 02/03/21 0220 02/04/21 0312 02/06/21 0159  WBC 14.3* 12.7* 14.0*  RBC 2.63* 2.68* 2.87*  HGB 8.6* 8.8* 9.2*  HCT 26.8* 27.6* 29.7*  MCV 101.9* 103.0* 103.5*  MCH 32.7 32.8 32.1  MCHC 32.1 31.9 31.0  RDW 16.1* 16.0* 16.1*  PLT 166 200 265    BNP Recent Labs  Lab 02/01/21 0346  BNP 333.0*     Patient Profile     Charles Archer a 85 y.o.malewith a hx of HTN, HLD, oxygen dependent COPD (10L) and CKDwho is being seen  for the evaluation of CHF.   Admitted 10/2013 for dyspnea on exertion.Myoview study demonstrates a fixed inferior wall defect. Echo shows an EF of 45% with global hypokinesis.Cath showed non obstructive (10-20% diffuse LAD; 20-30% RCA) CAD. Echocardiogram this admission shows ejection fraction 25 to 30%, grade 1 diastolic dysfunction, severe right ventricular enlargement, mild aortic insufficiency.  Assessment & Plan    1 acute on chronic combined systolic/diastolic congestive heart failure-he is not volume overloaded  on examination today and his BUN creatinine ratio has increased.  Lasix held yesterday - creatinine improved. Will likely restart low dose maintenance lasix 20 mg daily tomorrow.   2 severe COPD-pulmonary toilet per primary care.  This appears to be the predominant issue.  Palliative care has been consulted.  3 cardiomyopathy-we will continue low-dose beta-blocker.  Will not add ACE inhibitor, ARB or Entresto in the setting of worsening renal function and borderline blood pressure.  Patient also on palliative care and plan is for hospice with SNF.  4 recent GI bleed-hemoglobin is stable. Stools dark/tarry with blood streaks.   For questions or updates, please contact [facility] Please consult [email or web address] for contact info under   Chrystie Nose, MD, Milagros Loll  [place]  [facility]  Medical Director of the [facility] Lipid [facility]  Card[facility]ovascular Risk Reduction [facility] Diplomate of the American Board of Clinical Lipidology Attending Cardiologist  Direct Dial: [phone]  Fax: [phone]  Website:  [email or web address]  Chrystie Nose, MD  02/07/2021, 8:47 AM

## 2021-02-07 NOTE — Progress Notes (Signed)
Pt bowel movement for this morning continues to be dark and tarry but it also had some streaks of blood mixed in with it. Will continue to monitor.

## 2021-02-08 DIAGNOSIS — J9611 Chronic respiratory failure with hypoxia: Secondary | ICD-10-CM | POA: Diagnosis not present

## 2021-02-08 DIAGNOSIS — I5043 Acute on chronic combined systolic (congestive) and diastolic (congestive) heart failure: Secondary | ICD-10-CM | POA: Diagnosis not present

## 2021-02-08 DIAGNOSIS — N400 Enlarged prostate without lower urinary tract symptoms: Secondary | ICD-10-CM | POA: Diagnosis not present

## 2021-02-08 DIAGNOSIS — M179 Osteoarthritis of knee, unspecified: Secondary | ICD-10-CM | POA: Diagnosis not present

## 2021-02-08 DIAGNOSIS — E7800 Pure hypercholesterolemia, unspecified: Secondary | ICD-10-CM | POA: Diagnosis not present

## 2021-02-08 DIAGNOSIS — J449 Chronic obstructive pulmonary disease, unspecified: Secondary | ICD-10-CM | POA: Diagnosis not present

## 2021-02-08 DIAGNOSIS — K922 Gastrointestinal hemorrhage, unspecified: Secondary | ICD-10-CM | POA: Diagnosis not present

## 2021-02-08 DIAGNOSIS — J438 Other emphysema: Secondary | ICD-10-CM | POA: Diagnosis not present

## 2021-02-08 DIAGNOSIS — I10 Essential (primary) hypertension: Secondary | ICD-10-CM | POA: Diagnosis not present

## 2021-02-08 DIAGNOSIS — N183 Chronic kidney disease, stage 3 unspecified: Secondary | ICD-10-CM | POA: Diagnosis not present

## 2021-02-08 DIAGNOSIS — Z7189 Other specified counseling: Secondary | ICD-10-CM | POA: Diagnosis not present

## 2021-02-08 LAB — HEMOGLOBIN AND HEMATOCRIT, BLOOD
HCT: 32.7 % — ABNORMAL LOW (ref 39.0–52.0)
Hemoglobin: 9.8 g/dL — ABNORMAL LOW (ref 13.0–17.0)

## 2021-02-11 DIAGNOSIS — J449 Chronic obstructive pulmonary disease, unspecified: Secondary | ICD-10-CM | POA: Diagnosis not present

## 2021-02-11 DIAGNOSIS — J9611 Chronic respiratory failure with hypoxia: Secondary | ICD-10-CM | POA: Diagnosis not present

## 2021-02-13 NOTE — Progress Notes (Addendum)
PROGRESS NOTE    Charles Archer  HKV:[id number] DOB: 10-26-1935 DOA: 02/05/2021 PCP: Charles Floro, MD   Chief Complaint  Patient presents with  . Code Sepsis   Brief Narrative: 85 year old male with complex medical history with hypertension, hyperlipidemia, chronic oxygen dependent/hypoxic respiratory failure with COPD, on 10 L of nasal cannula,  Gout presented to the ED with shortness of breath on exertion on 01/28/2021 and also was complaining of urinary incontinence while on the wheelchair on Thursday 4/14 and noted to have blackish stool, decreased appetite. EMS was called patient was lethargic sepsis was suspected on arrival to the ED and was in respiratory distress wheezing with crackles, also hypotensive needing IV fluids. Also had leukocytosis and anemia tachycardia tachypnea chest x-ray with severe emphysema no active disease COVID-19 influenza AB-.  Patient was given Solu-Medrol antibiotics for possible urosepsis and Protonix drip for GI bleed and was admitted for UTI?Marland Kitchen Antibiotics were subsequently discontinued and UTI was not found multiple etiology suspected for some mild lactic acidosis with GI bleed melena.  Seen by GI, treated with Protonix x72 hours hemoglobin remained stable deduce cardiomyopathy respiratory failure was also seen by cardiology. Given his complex medical comorbidities, poor respiratory status palliative care was consulted. Palliative care has seen the patient plan is for DNR/DNI [facility] consulted for hospice evaluation, may likely need short-term SNF placement prior to going home with a potential hospice enrollment as per palliative.  Subjective: Seen this morning.  Patient, no shortness of breath unchanged from previous.  He is on 9l [place] No family at bedside.  I had discussed with patient's wife and son at the bedside yesterday.  Assessment & Plan:  Acute on chronic combined systolic/diastolic CHF: Seen by cardiology, treated with IV diuresis.  Lasix held  due to worsening renal function but creatinine overall stable, cont on low dose Lasix po 20 mg.  His weight is overall better.  Total net balance:Net IO Since Admission: -7,239.45 mL [2021/03/08 1125] Filed Weights   02/05/21 0355 02/06/21 0401 2021/03/08 0433  Weight: 65.7 kg 67.1 kg 64.6 kg   Cardiomyopathy continue low-dose beta-blocker no ACE inhibitor or ARB or Entresto due to worsening renal function and borderline blood pressure.   Soft blood pressure/hypotension patient needed to 250 ml bolus 4/24-BP stable 110-130s. Tolerating low dose lasix   Chronic hypoxic respiratory failure on 10 L nasal cannula at baseline Severe COPD Currently on 7> 9 L nasal cannula.  Has baseline shortness of breath.  Continue on current bronchodilators, oral prednisone, pulm support PT OT gentle Lasix as tolerated.  Awaiting for skilled nursing facility.   Recent GI bleeding hemoglobin is stable last H&H 9.4 g.GI has seen the patient not pursuing scope given his poor respiratory status.  Anemia likely from chronic disease recent GI bleeding hemoglobin is overall stable can add iron supplementation upon discharge.   Recent Labs  Lab 02/02/21 2238 02/03/21 0220 02/04/21 0312 02/06/21 0159 02/07/21 1057  HGB 8.5* 8.6* 8.8* 9.2* 9.4*  HCT 26.0* 26.8* 27.6* 29.7* 30.8*   AKI BUN/creatinine at 1.26. On Lasix 20 mg.  Checked BMP yesterday afternoon.   Recent Labs  Lab 02/03/21 0220 02/04/21 0312 02/05/21 0212 02/06/21 0159 02/07/21 1057  BUN 28* 44* 55* 51* 44*  CREATININE 1.07 1.17 1.44* 1.22 1.26*   Mild depression stable on Remeron.    Severe protein calorie malnutrition augment diet.  He is not eating much, continue to encourage.  With supplementation.  Leukocytosis/hyperglycemia likely in the setting of steroid use.  Monitor.  Patient is afebrile. Recent Labs  Lab 02/02/21 1241 02/02/21 2238 02/03/21 0220 02/04/21 0312 02/06/21 0159  WBC 11.7* 12.4* 14.3* 12.7* 14.0*   Atrial  tachycardia up to 150s on the morning of 4/21.  Continue current beta-blocker.  Heart rate is stable  BPH on Flomax  Gout: Stable on allopurinol  Goals of care: Palliative care input appreciated patient is DNR/DNI, at risk of decompensation overall  prognosis does not appear bright.  Patient family looking for short-term SNF then home with hospice.  May be difficult to get a skilled nursing facility given high oxygen need-TOC is currently searching for bed..   labs ordered for today.  Diet Order            DIET SOFT Room service appropriate? No; Fluid consistency: Thin  Diet effective now                 Nutrition Problem: Severe Malnutrition Etiology: chronic illness (COPD) Signs/Symptoms: severe fat depletion,severe muscle depletion,percent weight loss Percent weight loss: 13.1 % Interventions: Refer to RD note for recommendations,Boost Breeze,MVI,Prostat Patient's Body mass index is 19.86 kg/m.  DVT prophylaxis: SCDs Start: 2021/05/09 2010 Code Status:   Code Status: DNR.  Family Communication: plan of care discussed with patient at bedside. I have discussed with patient's wife and son at the bedside 4/25 and aware about plan. Upated daughter Charles Archer and answered all questions.  Status is: Inpatient Remains inpatient appropriate because:Inpatient level of care appropriate due to severity of illness  Dispo: The patient is from: Home              Anticipated d/c is to: SNF              Patient currently is medically stable, SNF once available.   Difficult to place patient No Unresulted Labs (From admission, onward)         None       Medications reviewed:  Scheduled Meds: . (feeding supplement) PROSource Plus  30 mL Oral BID BM  . sodium chloride   Intravenous Once  . allopurinol  100 mg Oral Daily  . aspirin  81 mg Oral Daily  . bisoprolol  2.5 mg Oral Daily  . budesonide  0.5 mg Nebulization BID  . calcium carbonate  625 mg Oral Daily  .  dextromethorphan-guaiFENesin  1 tablet Oral BID  . feeding supplement  237 mL Oral TID BM  . furosemide  20 mg Oral Daily  . levalbuterol  1.25 mg Nebulization BID  . mirtazapine  15 mg Oral QHS  . multivitamin with minerals  1 tablet Oral Daily  . niacin  250 mg Oral QHS  . pantoprazole  40 mg Oral BID  . predniSONE  10 mg Oral Q breakfast  . tamsulosin  0.4 mg Oral Daily  . vitamin B-12  1,000 mcg Oral Daily   Continuous Infusions:  Consultants:see note  Procedures:see note  Antimicrobials: Anti-infectives (From admission, onward)   Start     Dose/Rate Route Frequency Ordered Stop   01/31/21 0800  cefTRIAXone (ROCEPHIN) 1 g in sodium chloride 0.9 % 100 mL IVPB  Status:  Discontinued        1 g 200 mL/hr over 30 Minutes Intravenous Every 24 hours 2021/05/09 2049 01/31/21 1023   2021/05/09 1645  cefTRIAXone (ROCEPHIN) 1 g in sodium chloride 0.9 % 100 mL IVPB        1 g 200 mL/hr over 30 Minutes Intravenous  Once 2021/05/09 1644 2021/05/09  1745     Culture/Microbiology    Component Value Date/Time   SDES BLOOD RIGHT ANTECUBITAL 01/14/2021 1630   SPECREQUEST  02/06/2021 1630    BOTTLES DRAWN AEROBIC AND ANAEROBIC Blood Culture results may not be optimal due to an inadequate volume of blood received in culture bottles   CULT  01/31/2021 1630    NO GROWTH 5 DAYS Performed at [facility], [address] [place], [place] [address]    REPTSTATUS 02/04/2021 FINAL 01/21/2021 1630    Other culture-see note  Objective: Vitals: Today's Vitals   February 11, 2021 0005 Feb 11, 2021 0400 11-Feb-2021 0433 02/11/2021 0811  BP: (!) 114/47 123/68    Pulse: 89 91    Resp: 17 18    Temp: 98.5 F (36.9 C) 98.2 F (36.8 C)    TempSrc: Oral Oral    SpO2: 94% 90%  93%  Weight:   64.6 kg   Height:      PainSc:        Intake/Output Summary (Last 24 hours) at 11-Feb-2021 1119 Last data filed at 02-11-21 0406 Gross per 24 hour  Intake --  Output 500 ml  Net -500 ml   Filed Weights   02/05/21 0355  02/06/21 0401 February 11, 2021 0433  Weight: 65.7 kg 67.1 kg 64.6 kg   Weight change:   Intake/Output from previous day: 04/25 0701 - 04/26 0700 In: -  Out: 500 [Urine:500] Intake/Output this shift: No intake/output data recorded. Filed Weights   02/05/21 0355 02/06/21 0401 2021-02-11 0433  Weight: 65.7 kg 67.1 kg 64.6 kg    Examination: General exam: AAO, ill looking not in acute distress, on high flow nasal cannula,NAD, weak appearing. HEENT:Oral mucosa moist, Ear/Nose WNL grossly, dentition normal. Respiratory system: bilaterally diminished,no wheezing or crackles,no use of accessory muscle Cardiovascular system: S1 & S2 +, No JVD,. Gastrointestinal system: Abdomen soft, NT,ND, BS+ Nervous System:Alert, awake, moving extremities and grossly nonfocal Extremities: No edema, distal peripheral pulses palpable.  Skin: No rashes,no icterus. MSK: Normal muscle bulk,tone, power  Data Reviewed: I have personally reviewed following labs and imaging studies CBC: Recent Labs  Lab 02/01/21 1619 02/02/21 1241 02/02/21 2238 02/03/21 0220 02/04/21 0312 02/06/21 0159 02/07/21 1057  WBC 14.6* 11.7* 12.4* 14.3* 12.7* 14.0*  --   NEUTROABS 12.3*  --  9.5*  --   --   --   --   HGB 8.9* 8.1* 8.5* 8.6* 8.8* 9.2* 9.4*  HCT 27.7* 25.0* 26.0* 26.8* 27.6* 29.7* 30.8*  MCV 103.0* 101.6* 100.8* 101.9* 103.0* 103.5*  --   PLT 183 148* 166 166 200 265  --    Basic Metabolic Panel: Recent Labs  Lab 02/02/21 2238 02/03/21 0220 02/04/21 0312 02/05/21 0212 02/06/21 0159 02/07/21 1057  NA 138 139 139 140 142 141  K 4.1 4.2 3.8 4.2 4.0 4.0  CL 98 102 99 98 98 98  CO2 32 36* 33*  GLUCOSE 97 102* 106* 142* 100* 150*  BUN 27* 28* 44* 55* 51* 44*  CREATININE 1.08 1.07 1.17 1.44* 1.22 1.26*  CALCIUM 8.2* 8.4* 8.4* 8.6* 8.5* 8.5*  MG 2.0  --   --   --   --   --   PHOS 2.1*  --   --   --   --   --    GFR: Estimated Creatinine Clearance: 40.6 mL/min (A) (by C-G formula based on SCr of 1.26  mg/dL (H)). Liver Function Tests: Recent Labs  Lab 02/02/21 2238  ALBUMIN 2.6*  No results for input(s): LIPASE, AMYLASE in the last 168 hours. No results for input(s): AMMONIA in the last 168 hours. Coagulation Profile: No results for input(s): INR, PROTIME in the last 168 hours. Cardiac Enzymes: No results for input(s): CKTOTAL, CKMB, CKMBINDEX, TROPONINI in the last 168 hours. BNP (last 3 results) No results for input(s): PROBNP in the last 8760 hours. HbA1C: No results for input(s): HGBA1C in the last 72 hours. CBG: No results for input(s): GLUCAP in the last 168 hours. Lipid Profile: No results for input(s): CHOL, HDL, LDLCALC, TRIG, CHOLHDL, LDLDIRECT in the last 72 hours. Thyroid Function Tests: No results for input(s): TSH, T4TOTAL, FREET4, T3FREE, THYROIDAB in the last 72 hours. Anemia Panel: No results for input(s): VITAMINB12, FOLATE, FERRITIN, TIBC, IRON, RETICCTPCT in the last 72 hours. Sepsis Labs: No results for input(s): PROCALCITON, LATICACIDVEN in the last 168 hours.  Recent Results (from the past 240 hour(s))  Resp Panel by RT-PCR (Flu A&B, Covid) Nasopharyngeal Swab     Status: None   Collection Time: 02/17/2021  4:25 PM   Specimen: Nasopharyngeal Swab; Nasopharyngeal(NP) swabs in vial transport medium  Result Value Ref Range Status   SARS Coronavirus 2 by RT PCR NEGATIVE NEGATIVE Final    Comment: (NOTE) SARS-CoV-2 target nucleic acids are NOT DETECTED.  The SARS-CoV-2 RNA is generally detectable in upper respiratory specimens during the acute phase of infection. The lowest concentration of SARS-CoV-2 viral copies this assay can detect is 138 copies/mL. A negative result does not preclude SARS-Cov-2 infection and should not be used as the sole basis for treatment or other patient management decisions. A negative result may occur with  improper specimen collection/handling, submission of specimen other than nasopharyngeal swab, presence of viral  mutation(s) within the areas targeted by this assay, and inadequate number of viral copies(<138 copies/mL). A negative result must be combined with clinical observations, patient history, and epidemiological information. The expected result is Negative.  Fact Sheet for Patients:  BloggerCourse.com  Fact Sheet for Healthcare Providers:  SeriousBroker.it  This test is no t yet approved or cleared by the [place] FDA and  has been authorized for detection and/or diagnosis of SARS-CoV-2 by FDA under an Emergency Use Authorization (EUA). This EUA will remain  in effect (meaning this test can be used) for the duration of the COVID-19 declaration under Section 564(b)(1) of the Act, 21 U.S.C.section 360bbb-3(b)(1), unless the authorization is terminated  or revoked sooner.       Influenza A by PCR NEGATIVE NEGATIVE Final   Influenza B by PCR NEGATIVE NEGATIVE Final    Comment: (NOTE) The Xpert Xpress SARS-CoV-2/FLU/RSV plus assay is intended as an aid in the diagnosis of influenza from Nasopharyngeal swab specimens and should not be used as a sole basis for treatment. Nasal washings and aspirates are unacceptable for Xpert Xpress SARS-CoV-2/FLU/RSV testing.  Fact Sheet for Patients: BloggerCourse.com  Fact Sheet for Healthcare Providers: SeriousBroker.it  This test is not yet approved or cleared by the [place] FDA and has been authorized for detection and/or diagnosis of SARS-CoV-2 by FDA under an Emergency Use Authorization (EUA). This EUA will remain in effect (meaning this test can be used) for the duration of the COVID-19 declaration under Section 564(b)(1) of the Act, 21 U.S.C. section 360bbb-3(b)(1), unless the authorization is terminated or revoked.  Performed at [facility], [address], [place], [place] [address]   Blood Culture (routine x 2)      Status: None   Collection Time:  01/16/2021  4:25 PM   Specimen: BLOOD  Result Value Ref Range Status   Specimen Description BLOOD LEFT ANTECUBITAL  Final   Special Requests   Final    BOTTLES DRAWN AEROBIC AND ANAEROBIC Blood Culture results may not be optimal due to an inadequate volume of blood received in culture bottles   Culture   Final    NO GROWTH 5 DAYS Performed at [facility], [address], [place], [place] [address]    Report Status 02/04/2021 FINAL  Final  Blood Culture (routine x 2)     Status: None   Collection Time: 02/11/2021  4:30 PM   Specimen: BLOOD  Result Value Ref Range Status   Specimen Description BLOOD RIGHT ANTECUBITAL  Final   Special Requests   Final    BOTTLES DRAWN AEROBIC AND ANAEROBIC Blood Culture results may not be optimal due to an inadequate volume of blood received in culture bottles   Culture   Final    NO GROWTH 5 DAYS Performed at [facility], [address] [place], [place] [address]    Report Status 02/04/2021 FINAL  Final     Radiology Studies: No results found.   LOS: 9 days   Lanae Boast, MD [facility]  06-Mar-2021, 11:19 AM

## 2021-02-13 NOTE — Progress Notes (Signed)
Physical Therapy Treatment Patient Details Name: Charles Archer MRN: [id number] DOB: 05/25/1936 Today's Date: Feb 17, 2021    History of Present Illness Pt is an 85 y.o. male admitted 01/20/2021 with incontinence, dark stools, lethargy, respiratory distress. Workup for acute on chronic CHF, severe hypoxic respiratory failure secondary to severe COPD, recent GIB, AKI. PMH includes HTN, COPD (O2 dependence), gout.    PT Comments    Patient participated as able this date, being limited by respiratory status. Tolerated supine exercises with multiple rest periods to maintain RR <30 and sats >87% on 9L. Agreed to EOB, however tolerated only 30 seconds with RR 40 and sats 87%. On return to supine required 4 minutes to reduce RR to 25 and recover sats to 90%.   Noted plan related to hospice is still undecided. Pt reported to me that he is going to go somewhere for "rehab" and then later stated he is going straight home.      Follow Up Recommendations  SNF;Supervision for mobility/OOB (vs. Hospice)     Equipment Recommendations  Hospital bed;Rolling walker with 5" wheels;3in1 (PT)    Recommendations for Other Services       Precautions / Restrictions Precautions Precautions: Fall;Other (comment) Precaution Comments: Watch SpO2, BP, RR; pt reports wearing 10L O2 chronic    Mobility  Bed Mobility Overal bed mobility: Needs Assistance Bed Mobility: Supine to Sit;Sit to Supine     Supine to sit: Min guard Sit to supine: Supervision   General bed mobility comments: Sat EOB 30 seconds and returned to supine himself. Increased RR to 40 sats down to 87% on 9L    Transfers                    Ambulation/Gait                 Stairs             Wheelchair Mobility    Modified Rankin (Stroke Patients Only)       Balance Overall balance assessment: Needs assistance Sitting-balance support: No upper extremity supported;Feet supported Sitting balance-Leahy Scale:  Fair                                      Cognition Arousal/Alertness: Awake/alert Behavior During Therapy: Flat affect Overall Cognitive Status: No family/caregiver present to determine baseline cognitive functioning Area of Impairment: Attention;Memory;Following commands;Safety/judgement;Awareness;Problem solving;Orientation                 Orientation Level: Place;Time;Situation Current Attention Level: Sustained Memory: Decreased short-term memory Following Commands: Follows one step commands with increased time;Follows one step commands inconsistently Safety/Judgement: Decreased awareness of deficits   Problem Solving: Slow processing;Decreased initiation;Requires verbal cues General Comments: Did not believe he was in the hospital yet knew he was not at home      Exercises General Exercises - Lower Extremity Ankle Circles/Pumps: AROM;Both;10 reps Short Arc Quad: AROM;Both;10 reps Heel Slides: AROM;Both;5 reps    General Comments General comments (skin integrity, edema, etc.): Required incr time on return to supine for RR to reach 25 bpm and sats to reach 90%.      Pertinent Vitals/Pain Pain Assessment: No/denies pain    Home Living                      Prior Function  PT Goals (current goals can now be found in the care plan section) Acute Rehab PT Goals Patient Stated Goal: agreeable to rehab Time For Goal Achievement: 02/20/21 Potential to Achieve Goals: Fair Progress towards PT goals: Not progressing toward goals - comment (limited by resp status)    Frequency    Min 2X/week      PT Plan Current plan remains appropriate    Co-evaluation PT/OT/SLP Co-Evaluation/Treatment: Yes            AM-PAC PT "6 Clicks" Mobility   Outcome Measure  Help needed turning from your back to your side while in a flat bed without using bedrails?: A Lot Help needed moving from lying on your back to sitting on the side of a  flat bed without using bedrails?: A Lot Help needed moving to and from a bed to a chair (including a wheelchair)?: Total Help needed standing up from a chair using your arms (e.g., wheelchair or bedside chair)?: Total Help needed to walk in hospital room?: Total Help needed climbing 3-5 steps with a railing? : Total 6 Click Score: 8    End of Session Equipment Utilized During Treatment: Oxygen Activity Tolerance: Patient limited by fatigue;Treatment limited secondary to medical complications (Comment) (drop in sats and incr RR) Patient left: in bed;with call bell/phone within reach;with bed alarm set Nurse Communication: Mobility status;Other (comment) (drop in sats and prolonged recovery after EOB x 30 sec) PT Visit Diagnosis: Other abnormalities of gait and mobility (R26.89);Muscle weakness (generalized) (M62.81)     Time: 0865-7846 PT Time Calculation (min) (ACUTE ONLY): 23 min  Charges:  $Therapeutic Exercise: 8-22 mins                      Jerolyn Center, PT Pager [phone]    Zena Amos 02-27-2021, 12:17 PM

## 2021-02-13 NOTE — Progress Notes (Signed)
Progress Note  Patient Name: Charles Archer Date of Encounter: 02-11-21  [facility] Cardiologist: Parke Poisson, MD   Subjective   No issues overnight. Still requiring high flow oxygen. Creatinine appears stable. Hemoglobin is stable.   Inpatient Medications    Scheduled Meds: . (feeding supplement) PROSource Plus  30 mL Oral BID BM  . sodium chloride   Intravenous Once  . allopurinol  100 mg Oral Daily  . aspirin  81 mg Oral Daily  . bisoprolol  2.5 mg Oral Daily  . budesonide  0.5 mg Nebulization BID  . calcium carbonate  625 mg Oral Daily  . dextromethorphan-guaiFENesin  1 tablet Oral BID  . feeding supplement  237 mL Oral TID BM  . furosemide  20 mg Oral Daily  . levalbuterol  1.25 mg Nebulization BID  . mirtazapine  15 mg Oral QHS  . multivitamin with minerals  1 tablet Oral Daily  . niacin  250 mg Oral QHS  . pantoprazole  40 mg Oral BID  . predniSONE  10 mg Oral Q breakfast  . tamsulosin  0.4 mg Oral Daily  . vitamin B-12  1,000 mcg Oral Daily   Continuous Infusions:  PRN Meds: acetaminophen, albuterol, docusate sodium, levalbuterol   Vital Signs    Vitals:   02-11-21 0005 02-11-21 0400 February 11, 2021 0433 11-Feb-2021 0811  BP: (!) 114/47 123/68    Pulse: 89 91    Resp: 17 18    Temp: 98.5 F (36.9 C) 98.2 F (36.8 C)    TempSrc: Oral Oral    SpO2: 94% 90%  93%  Weight:   64.6 kg   Height:        Intake/Output Summary (Last 24 hours) at 11-Feb-2021 1136 Last data filed at 02-11-2021 0406 Gross per 24 hour  Intake --  Output 500 ml  Net -500 ml   Last 3 Weights February 11, 2021 02/06/2021 02/05/2021  Weight (lbs) 142 lb 6.7 oz 147 lb 14.9 oz 144 lb 13.5 oz  Weight (kg) 64.6 kg 67.1 kg 65.7 kg      Telemetry    Sinus to sinus tachycardia - Personally Reviewed  Physical Exam   General appearance: alert and no distress Neck: no carotid bruit, no JVD and thyroid not enlarged, symmetric, no tenderness/mass/nodules Lungs: diminished breath sounds  bilaterally and rhonchi bilaterally Heart: irregularly irregular rhythm Extremities: extremities normal, atraumatic, no cyanosis or edema Neurologic: Grossly normal Psych: Pleasant   Labs    High Sensitivity Troponin:   Recent Labs  Lab 01/24/2021 1625 02/07/2021 1826 01/31/21 0334  TROPONINIHS 36* 37* 60*      Chemistry Recent Labs  Lab 02/02/21 2238 02/03/21 0220 02/05/21 0212 02/06/21 0159 02/07/21 1057  NA 138   < > 140 142 141  K 4.1   < > 4.2 4.0 4.0  CL 98   < > 98 98 98  CO2 30   < > 32 36* 33*  GLUCOSE 97   < > 142* 100* 150*  BUN 27*   < > 55* 51* 44*  CREATININE 1.08   < > 1.44* 1.22 1.26*  CALCIUM 8.2*   < > 8.6* 8.5* 8.5*  ALBUMIN 2.6*  --   --   --   --   GFRNONAA >60   < > 48* 59* 57*  ANIONGAP 10   < > 10 8 10    < > = values in this interval not displayed.     Hematology Recent Labs  Lab  02/03/21 0220 02/04/21 0312 02/06/21 0159 02/07/21 1057  WBC 14.3* 12.7* 14.0*  --   RBC 2.63* 2.68* 2.87*  --   HGB 8.6* 8.8* 9.2* 9.4*  HCT 26.8* 27.6* 29.7* 30.8*  MCV 101.9* 103.0* 103.5*  --   MCH 32.7 32.8 32.1  --   MCHC 32.1 31.9 31.0  --   RDW 16.1* 16.0* 16.1*  --   PLT 166 200 265  --     BNP No results for input(s): BNP, PROBNP in the last 168 hours.   Patient Profile     Charles Archer Nicksis a 85 y.o.malewith a hx of HTN, HLD, oxygen dependent COPD (10L) and CKDwho is being seen  for the evaluation of CHF.   Admitted 10/2013 for dyspnea on exertion.Myoview study demonstrates a fixed inferior wall defect. Echo shows an EF of 45% with global hypokinesis.Cath showed non obstructive (10-20% diffuse LAD; 20-30% RCA) CAD. Echocardiogram this admission shows ejection fraction 25 to 30%, grade 1 diastolic dysfunction, severe right ventricular enlargement, mild aortic insufficiency.  Assessment & Plan    1 acute on chronic combined systolic/diastolic congestive heart failure-lasix 20 mg oral started today for maintenance, monitor creatinine  2  severe COPD-pulmonary toilet per primary care. Still requiring high flow oxygen. This appears to be the predominant issue.  Palliative care has been consulted.  3 cardiomyopathy-we will continue low-dose beta-blocker.  Will not add ACE inhibitor, ARB or Entresto in the setting of worsening renal function and borderline blood pressure.  Patient also on palliative care and plan is for hospice with SNF.  4 recent GI bleed-hemoglobin is stable. Stools dark/tarry with blood streaks.   For questions or updates, please contact [facility] Please consult [email or web address] for contact info under   Chrystie Nose, MD, Milagros Loll  [place]  [facility]  Medical Director of the Advanced Lipid Disorders &  Cardiovascular Risk Reduction Clinic Diplomate of the American Board of Clinical Lipidology Attending Cardiologist  Direct Dial: [phone]  Fax: [phone]  Website:  [email or web address]  Chrystie Nose, MD  02/04/2021, 11:36 AM

## 2021-02-13 NOTE — Progress Notes (Signed)
Informed by RN that patient has passed away. His HR was in 30s on monitor,and they had rushed to to the room and was unresponsive. I came at bedside immediately.No response, no pulse, no heart and lung sounds and tele was flat line (prior to removal by RN).He was pronounced at 17:30 I called daughter [name] and informed her, offered by deepest condolences. I had spoken to Daughter [name] earlier on the day and was updated about his overall poor prognosis. Spoke to palliative care Lora Havens they were planning to meet to d/w hospice/comfort tomorrow.

## 2021-02-13 NOTE — Progress Notes (Addendum)
Daily Progress Note   Patient Name: Charles Archer       Date: 01/20/2021 DOB: 11-14-1935  Age: 85 y.o. MRN#: [id number] Attending Physician: Lanae Boast, MD Primary Care Physician: Daisy Floro, MD Admit Date: Feb 11, 2021  Reason for Consultation/Follow-up: Establishing goals of care  Subjective: Received call from patient's daughter Charles Archer. She is very overwhelmed with advanced are planning for patient and unsure of which path to proceed for him- continued aggressive medical care with SNF rehab vs comfort/Hospice care. She has concerns about the ability for her Dad to receive adequate care at home and there is not someone available 24/7. She is really looking for some guidance in her decision making.  I evaluated patient after speaking with Charles Archer. He was sleeping, his breathing appeared labored.  He did wake briefly and denied pain. He needed to have an urgent bowel movement.  His son was at bedside and shared concerns regarding the cause of his GI bleeding and questioning if the GI bleeding was the reason for his poor po intake. We discussed his stable hemoglobin not indicating significant bleed. We also discussed his respiratory status that can cause decreased po intake- noted he is on 10L at baseline.     Length of Stay: 9  Current Medications: Scheduled Meds:  . (feeding supplement) PROSource Plus  30 mL Oral BID BM  . sodium chloride   Intravenous Once  . allopurinol  100 mg Oral Daily  . bisoprolol  2.5 mg Oral Daily  . budesonide  0.5 mg Nebulization BID  . calcium carbonate  625 mg Oral Daily  . dextromethorphan-guaiFENesin  1 tablet Oral BID  . feeding supplement  237 mL Oral TID BM  . furosemide  20 mg Oral Daily  . levalbuterol  1.25 mg Nebulization BID  . mirtazapine  15 mg  Oral QHS  . multivitamin with minerals  1 tablet Oral Daily  . niacin  250 mg Oral QHS  . pantoprazole  40 mg Oral BID  . predniSONE  10 mg Oral Q breakfast  . tamsulosin  0.4 mg Oral Daily  . vitamin B-12  1,000 mcg Oral Daily    Continuous Infusions:   PRN Meds: acetaminophen, albuterol, docusate sodium, levalbuterol  Physical Exam Vitals and nursing note reviewed.  Constitutional:      Appearance:  He is ill-appearing.     Comments: Very thin and frail, cachetic  Pulmonary:     Comments: Increased rate and effort Neurological:     Comments: lethargic             Vital Signs: BP 108/79 (BP Location: Left Arm)   Pulse 93   Temp 98.4 F (36.9 C) (Oral)   Resp (!) 22   Ht 5\' 11"  (1.803 m)   Wt 64.6 kg   SpO2 92%   BMI 19.86 kg/m  SpO2: SpO2: 92 % O2 Device: O2 Device: High Flow Nasal Cannula O2 Flow Rate: O2 Flow Rate (L/min): 10 L/min  Intake/output summary:   Intake/Output Summary (Last 24 hours) at 02/09/2021 1618 Last data filed at 01/27/2021 0406 Gross per 24 hour  Intake --  Output 500 ml  Net -500 ml   LBM: Last BM Date: 01/18/2021 Baseline Weight: Weight: 67.4 kg Most recent weight: Weight: 64.6 kg       Palliative Assessment/Data: PPS: 20%      Patient Active Problem List   Diagnosis Date Noted  . Acute on chronic combined systolic and diastolic CHF (congestive heart failure) (HCC)   . Protein-calorie malnutrition, severe 02/02/2021  . Upper GI bleed 02/25/21  . Sepsis secondary to UTI (HCC) Feb 25, 2021  . Lactic acidosis Feb 25, 2021  . Acute dehydration Feb 25, 2021  . Hypoalbuminemia 2021-02-25  . Hyperglycemia 02-25-2021  . Leukocytosis Feb 25, 2021  . Macrocytic anemia 02-25-2021  . Elevated brain natriuretic peptide (BNP) level 02-25-21  . Gout 02-25-21  . Elevated troponin 02-25-21  . Symptomatic anemia 02-25-21  . Physical deconditioning 07/31/2018  . Hypoxemic respiratory failure, chronic (HCC) 02/15/2017  . Lung nodule  01/19/2017  . Allergic rhinitis 03/24/2015  . Chronic cough 03/24/2015  . COPD (chronic obstructive pulmonary disease) (HCC) 12/16/2013  . Abnormal nuclear stress test 10/20/2013  . HTN (hypertension)   . Hyperlipidemia   . Dyspnea on exertion     Palliative Care Assessment & Plan   Patient Profile: Per intake H&P -->Lam A Nicksis a 85 y.o.malewith medical history significant forCOPD on 10 L of oxygen via HFNC, BPH, hypertension, hyperlipidemia, gout who presented to ED due to shortness of on exertion since Friday (4/15), he endorsed urinary incontinence while on his wheelchair on Thursday (4/14), he also noted that his stool was black.He received a transfusion on admission. Hgb is stable although he continues to have black stools reported. He is having continued shortness of breath on 10L [place]. His volume status is stable- appears COPD is primary issue. There are no further interventions planned for GI bleeding as Hgb is stable. Palliative consulting for goals of care.   Assessment/Recommendations/Plan   Plan to meet with Charles Archer again tomorrow at 1:30 for further GOC discussion  I am concerned about patient's ability to rehab and recover- he appears very ill- no decisions have yet been made  Goals of Care and Additional Recommendations:  Limitations on Scope of Treatment: Full Scope Treatment  Code Status:  DNR  Prognosis:   Unable to determine- likely weeks to months if patient does not show significant improvement  Discharge Planning:  To Be Determined  Care plan was discussed with patient's daughter and son.   Thank you for allowing the Palliative Medicine Team to assist in the care of this patient.   Total time: 67 minutes  Greater than 50%  of this time was spent counseling and coordinating care related to the above assessment and plan.  04-01-1981, AGNP-C Palliative  Medicine   Please contact Palliative Medicine Team phone at [phone] for questions and  concerns.

## 2021-02-13 NOTE — Progress Notes (Incomplete)
This nurse received a call from TELE that patients heart rate was in the 30's. Upon assessment, patient appeared white. This nurse and second nurse Tresa Endo performed lung and heart auscultation

## 2021-02-13 NOTE — TOC Initial Note (Addendum)
Transition of Care [facility]) - Initial/Assessment Note    Patient Details  Name: Charles Archer MRN: [id number] Date of Birth: Apr 28, 1936  [facility] of Care [facility]) CM/SW Contact:    [name], RN Phone Number: [phone]  02/07/2021, 9:32 AM  Clinical Narrative:                 CM at bedside to speak with patient about disposition plan. Patient is alert to self only. Unable to answer questions about discharge plan. CM attempted to contact wife [name] [phone] with no answer. CM spoke with daughter [name] who confirms that the family would like patient to be worked up for SNF placement. CM to initiate SNF bed search once patients O2 is weaned. Documentation currently reflects 10L HFNC which is not within criteria for SNF.    Expected Discharge Plan: Skilled Nursing Facility Barriers to Discharge: Continued Medical Work up   Patient Goals and CMS Choice Patient states their goals for this hospitalization and ongoing recovery are:: Patient is confused rambling conversation   Choice offered to / list presented to : NA (daughter refused at this time)  Expected Discharge Plan and Services Expected Discharge Plan: Skilled Nursing Facility In-house Referral: NA Discharge Planning Services: CM Consult   Living arrangements for the past 2 months: Single Family Home                 DME Arranged: N/A DME Agency: NA       HH Arranged: NA HH Agency: NA        Prior Living Arrangements/Services Living arrangements for the past 2 months: Single Family Home Lives with:: Spouse Patient language and need for interpreter reviewed:: Yes Do you feel safe going back to the place where you live?:  (unable to respond appropiately)      Need for Family Participation in Patient Care: Yes (Comment) Care giver support system in place?: Yes (comment) Current home services:  (n/a)    Activities of Daily Living Home Assistive Devices/Equipment: Wheelchair,Grab bars in shower,Shower  chair without back,Oxygen ADL Screening (condition at time of admission) Patient's cognitive ability adequate to safely complete daily activities?: Yes Is the patient deaf or have difficulty hearing?: Yes Does the patient have difficulty seeing, even when wearing glasses/contacts?: No Does the patient have difficulty concentrating, remembering, or making decisions?: No Patient able to express need for assistance with ADLs?: Yes Does the patient have difficulty dressing or bathing?: Yes Independently performs ADLs?: No Communication: Independent Dressing (OT): Needs assistance Is this a change from baseline?: Change from baseline, expected to last >3 days Grooming: Needs assistance Is this a change from baseline?: Change from baseline, expected to last >3 days Feeding: Independent Bathing: Needs assistance Is this a change from baseline?: Change from baseline, expected to last >3 days Toileting: Needs assistance Is this a change from baseline?: Change from baseline, expected to last >3days In/Out Bed: Needs assistance Is this a change from baseline?: Change from baseline, expected to last >3 days Walks in Home: Needs assistance Is this a change from baseline?: Change from baseline, expected to last >3 days Does the patient have difficulty walking or climbing stairs?: Yes (due to lungs) Weakness of Legs: None Weakness of Arms/Hands: None  Permission Sought/Granted   Permission granted to share information with : No              Emotional Assessment Appearance:: Appears stated age Attitude/Demeanor/Rapport: Other (comment) (confused) Affect (typically observed): Other (comment) (confused) Orientation: : Oriented to  Self Alcohol / Substance Use: Not Applicable Psych Involvement: No (comment)  Admission diagnosis:  Upper GI bleed [K92.2] Acute upper GI bleed [K92.2] Patient Active Problem List   Diagnosis Date Noted  . Acute on chronic combined systolic and diastolic CHF  (congestive heart failure) (HCC)   . Protein-calorie malnutrition, severe 02/02/2021  . Upper GI bleed 01/18/2021  . Sepsis secondary to UTI (HCC) 01/27/2021  . Lactic acidosis 01/14/2021  . Acute dehydration 01/31/2021  . Hypoalbuminemia 02/07/2021  . Hyperglycemia 01/24/2021  . Leukocytosis 01/16/2021  . Macrocytic anemia 02/09/2021  . Elevated brain natriuretic peptide (BNP) level 01/29/2021  . Gout 01/26/2021  . Elevated troponin 01/23/2021  . Symptomatic anemia 02/04/2021  . Physical deconditioning 07/31/2018  . Hypoxemic respiratory failure, chronic (HCC) 02/15/2017  . Lung nodule 01/19/2017  . Allergic rhinitis 03/24/2015  . Chronic cough 03/24/2015  . COPD (chronic obstructive pulmonary disease) (HCC) 12/16/2013  . Abnormal nuclear stress test 10/20/2013  . HTN (hypertension)   . Hyperlipidemia   . Dyspnea on exertion    PCP:  Daisy Floro, MD Pharmacy:   [facility] - [place], [place] - [address] [place] [address] Phone: [phone] Fax: [phone]     Social Determinants of Health (SDOH) Interventions    Readmission Risk Interventions Readmission Risk Prevention Plan 02-26-2021  Transportation Screening Complete  PCP or Specialist Appt within 3-5 Days Complete  HRI or Home Care Consult Complete  Social Work Consult for Recovery Care Planning/Counseling Complete  Palliative Care Screening Complete  Medication Review (RN Care Manager) Referral to Pharmacy  Some recent data might be hidden

## 2021-02-13 NOTE — Death Summary Note (Signed)
DEATH SUMMARY   Patient Details  Name: Charles Archer MRN: [id number] DOB: [birth year]  Admission/Discharge Information   Admit Date:  14-Feb-2021  Date of Death: Date of Death: Feb 23, 2021  Time of Death: Time of Death: Feb 15, 1729  Length of Stay: 02/07/2023  Referring Physician: Daisy Floro, MD   Reason(s) for Hospitalization  Black stool  Diagnoses  Preliminary cause of death:  Respiratory failure Secondary Diagnoses (including complications and co-morbidities): acute on chronic systolic/Diastolic failure Chronic hypoxic respiratory failure on 10 [place] at home Severe end stage COPD-must follow-up with Dr. Delton Archer, recent CT in 12/2020-severe emphysema Has baseline shortness of breath. Was on bronchodilators, oral prednisone.  Acute on chronic combined systolic/diastolic CHF: Seen by cardiology, treated with IV diuresis.  Lasix held due to worsening renal function , creat stable, on low dose Lasix po 20 mg.  Cardiomyopathy continue low-dose beta-blocker no ACE inhibitor or ARB or Entresto due to worsening renal function and borderline blood pressure.    Soft blood pressure/hypotension  needing IV fluid intermittently.     GI bleeding hemoglobin is stable last H&H 9.4 g no significant active bleeding however intermittently having black stool. GI has seen the patient not pursuing scope given his poor respiratory status and also not having acute bleeding. Anemia likely from chronic disease recent GI bleeding hemoglobin stable overall.        Recent Labs  Lab 02/02/21 2238 02/03/21 0220 02/04/21 0312 02/06/21 0159 02/07/21 1057  HGB 8.5* 8.6* 8.8* 9.2* 9.4*  HCT 26.0* 26.8* 27.6* 29.7* 30.8*     Failure to thrive/deconditioning Severe protein calorie malnutrition augment diet.  He is not eating much, continue to encourage.  With supplementation.  AKI BUN/creatinine at 1.26. On Lasix 20 mg.  Last Labs          Recent Labs  Lab 02/03/21 0220 02/04/21 0312 02/05/21 0212 02/06/21 0159  02/07/21 1057  BUN 28* 44* 55* 51* 44*  CREATININE 1.07 1.17 1.44* 1.22 1.26*     Elevated troponin 36>37> 60: demand mismatch due to congestive heart failure cardiomyopathy respiratory failure   Mild depression on Remeron.   Leukocytosis/hyperglycemia likely in the setting of steroid use.  Monitor.  Patient is afebrile. Atrial tachycardia up to 150s on the morning of 4/21.  Continue current beta-blocker.  Heart rate is stable BPH on Flomax Lactic acidosis Macrocytic anemia Gout: Stable on allopurinol GOC: Palliative care was following closely.  Patient continued to have poor oral intake has been lethargic, continueD to need 10 L nasal cannula, at risk of acute decompensation and family awake palliative care was following closely plan was for home with hospice after short-term rehab but the meeting was being planned to discuss goals of care 4/27  Brief Hospital Course (including significant findings, care, treatment, and services provided and events leading to death)  Charles Archer is a 85 y.o. year old male who 85 year old male with complex medical history with hypertension, hyperlipidemia, chronic oxygen dependent/chronic hypoxic respiratory failure with COPD on 10 L of nasal cannula,  Gout presented to the ED with shortness of breath on exertion on 01/28/2021 and also was complaining of urinary incontinence while on the wheelchair on Thursday 4/14 and noted to have blackish stool, decreased appetite.EMS was called patient was lethargic , sepsis was suspected on arrival to the ED and was in respiratory distress wheezing with crackles, also hypotensive needing IV fluids. Labs showed leukocytosis and anemia tachycardia tachypnea chest x-ray with severe emphysema no active disease COVID-19 influenza AB  negative. Patient was given Solu-Medrol antibiotics for possible urosepsis and Protonix drip for GI bleed and was admitted for UTI?Marland Kitchen Antibiotics were subsequently discontinued as no infection found-  SEPSIS RULED OUT. Multiple etiology suspected for  lactic acidosis - with GI bleed melen, hypoxic respiratory failure.  Seen by GI, treated with Protonix x72 hours hemoglobin remained stable  and due to cardiomyopathy respiratory failure he was also seen by cardiology. Given his complex medical comorbidities, poor respiratory status palliative care was consulted. Palliative care has seen the patient and plan was for Home with hospice and family desired short term SNF. He was DNR/DNI,TOC was consulted for hospice evaluation.  It appears patient has been living in [address] for few months and seemed to have reached end stage. Patient continued to exhibit failure to thrive severely malnourished with poor oral intake. Palliative care meeting was being planned on [phone] for further GOC discussion, initially plan was for short-term rehab-and home with hospice.  Daughter was updated regarding his overall poor prognosis. Unfortunately nursing found that his heart rate has bradyed down in 30s and when rushed to check in he was unresponsive white no falls.  I immediately came and saw him after being found.  Patient was pronounced. I called and updated his daughter Charles Archer and offered my deepest condolences.  Pertinent Labs and Studies  Significant Diagnostic Studies DG CHEST PORT 1 VIEW  Result Date: 02/01/2021 CLINICAL DATA:  Shortness of breath. EXAM: PORTABLE CHEST 1 VIEW COMPARISON:  Chest x-ray dated January 30, 2021. FINDINGS: The heart size and mediastinal contours are within normal limits. The lungs remain hyperinflated with severe emphysematous changes. Scattered pleuroparenchymal scarring, also involving both costophrenic angles. No focal consolidation, pleural effusion, or pneumothorax. No acute osseous abnormality. IMPRESSION: 1. COPD. No active disease. Electronically Signed   By: Obie Dredge M.D.   On: 02/01/2021 16:42   DG Chest Port 1 View  Result Date: 01/21/2021 CLINICAL DATA:  Sepsis EXAM:  PORTABLE CHEST 1 VIEW COMPARISON:  12/22/2020 FINDINGS: The lungs are hyperinflated and there is a paucity of vasculature within the lung apices in keeping with changes of severe emphysema, similar to that noted on prior examination. No superimposed focal pulmonary infiltrate. No pneumothorax or pleural effusion. Cardiac size within normal limits. No acute bone abnormality. IMPRESSION: No active disease.  Severe emphysema. Electronically Signed   By: Helyn Numbers MD   On: 01/22/2021 17:28   DG UGI W SINGLE CM (SOL OR THIN BA)  Result Date: 02/02/2021 CLINICAL DATA:  GI bleed. EXAM: UPPER GI SERIES WITH KUB TECHNIQUE: After obtaining a scout radiograph a routine upper GI series was performed using thin barium FLUOROSCOPY TIME:  Fluoroscopy Time:  1 minutes 30 second Radiation Exposure Index (if provided by the fluoroscopic device): Number of Acquired Spot Images: 15 COMPARISON:  None. FINDINGS: Preliminary KUB demonstrates normal bowel gas pattern. The patient drank an adequate amount of thin barium through a straw. Normal esophageal function. No esophageal stricture or mass. No hiatal hernia. Limited mobility. The stomach is evaluated in the right lateral view and both oblique views. Stomach normal in volume. Normal gastric mucosa. No ulcer or mass in the stomach. Normal gastric emptying. Duodenal bulb normal. No ulcer. Normal duodenal C loop. IMPRESSION: Negative upper GI.  No ulcer or mass identified. Electronically Signed   By: Marlan Palau M.D.   On: 02/02/2021 13:43   ECHOCARDIOGRAM COMPLETE  Result Date: 01/31/2021    ECHOCARDIOGRAM REPORT   Patient Name:   DOSSIE  A Sensabaugh Date of Exam: 01/31/2021 Medical Rec #:  [id number]      Height:       71.0 in Accession #:    [id number]     Weight:       148.6 lb Date of Birth:  08/12/1936      BSA:          1.858 m Patient Age:    85 years       BP:           150/73 mmHg Patient Gender: M              HR:           104 bpm. Exam Location:  Inpatient Procedure:  2D Echo, Cardiac Doppler and Color Doppler Indications:    I50.9* Heart failure (unspecified)  History:        Patient has prior history of Echocardiogram examinations, most                 recent 10/15/2013. COPD; Risk Factors:Hypertension and                 Dyslipidemia.  Sonographer:    Roosvelt Maserachel Lane RDCS Referring Phys: [id number] OLADAPO ADEFESO IMPRESSIONS  1. Left ventricular ejection fraction, by estimation, is 25 to 30%. The left ventricle has severely decreased function. Wall motion very difficult to assess due to significant ectopy and poor endocardial visualization, however, the inferior wall appears  more hypokinetic than the other LV segments. Left ventricular diastolic parameters are consistent with Grade I diastolic dysfunction (impaired relaxation).  2. Right ventricular systolic function is normal. The right ventricular size is severely enlarged.  3. The mitral valve is grossly normal. Trivial mitral valve regurgitation.  4. The aortic valve is tricuspid. There is moderate calcification of the aortic valve. There is moderate thickening of the aortic valve. Aortic valve regurgitation is mild. Mild to moderate aortic valve sclerosis/calcification is present, without any evidence of aortic stenosis.  5. Aortic dilatation noted. There is mild dilatation of the ascending aorta, measuring 36 mm. Comparison(s): Compared to prior echo report in 2014, the LVEF is now severely reduced 25-30% (previously 45%) and the RV is severely dilated (previously reported as normal in size). FINDINGS  Left Ventricle: Wall motion very difficult to assess due to significant ectopy and poor endocardial visualization, however, the inferior wall appears more hypokinetic than the other LV segments. Left ventricular ejection fraction, by estimation, is 25 to 30%. The left ventricle has severely decreased function. Wall motion very difficult to assess due to significant ectopy and poor endocardial visualization, however, the  inferior wall appears more hypokinetic than the other LV segments.The left ventricular internal cavity size was normal in size. There is no left ventricular hypertrophy. Left ventricular diastolic parameters are consistent with Grade I diastolic dysfunction (impaired relaxation). Right Ventricle: The right ventricular size is severely enlarged. No increase in right ventricular wall thickness. Right ventricular systolic function is normal. Left Atrium: Left atrial size was not well visualized. Right Atrium: Right atrial size was normal in size. Pericardium: There is no evidence of pericardial effusion. Mitral Valve: The mitral valve is grossly normal. There is mild thickening of the mitral valve leaflet(s). There is mild calcification of the mitral valve leaflet(s). Mild to moderate mitral annular calcification. Trivial mitral valve regurgitation. Tricuspid Valve: The tricuspid valve is normal in structure. Tricuspid valve regurgitation is trivial. Aortic Valve: The aortic valve is tricuspid. There is moderate calcification of the aortic valve.  There is moderate thickening of the aortic valve. Aortic valve regurgitation is mild. Mild to moderate aortic valve sclerosis/calcification is present, without any evidence of aortic stenosis. Aortic valve mean gradient measures 5.0 mmHg. Aortic valve peak gradient measures 12.2 mmHg. Aortic valve area, by VTI measures 1.44 cm. Pulmonic Valve: The pulmonic valve was normal in structure. Pulmonic valve regurgitation is trivial. Aorta: Aortic dilatation noted. There is mild dilatation of the ascending aorta, measuring 36 mm. IAS/Shunts: No atrial level shunt detected by color flow Doppler.  LEFT VENTRICLE PLAX 2D LVIDd:         4.20 cm      Diastology LVIDs:         3.60 cm      LV e' medial:    13.70 cm/s LV PW:         1.00 cm      LV E/e' medial:  4.0 LV IVS:        0.90 cm      LV e' lateral:   4.90 cm/s LVOT diam:     2.00 cm      LV E/e' lateral: 11.3 LV SV:         36 LV  SV Index:   20 LVOT Area:     3.14 cm  LV Volumes (MOD) LV vol d, MOD A2C: 137.0 ml LV vol d, MOD A4C: 111.0 ml LV vol s, MOD A2C: 94.8 ml LV vol s, MOD A4C: 77.0 ml LV SV MOD A2C:     42.2 ml LV SV MOD A4C:     111.0 ml LV SV MOD BP:      35.3 ml RIGHT VENTRICLE RV Basal diam:  4.90 cm RV Mid diam:    6.10 cm RV S prime:     12.70 cm/s TAPSE (M-mode): 2.5 cm LEFT ATRIUM             Index       RIGHT ATRIUM           Index LA diam:        3.60 cm 1.94 cm/m  RA Area:     13.50 cm LA Vol (A2C):   77.5 ml 41.70 ml/m RA Volume:   35.40 ml  19.05 ml/m LA Vol (A4C):   50.4 ml 27.12 ml/m LA Biplane Vol: 61.8 ml 33.25 ml/m  AORTIC VALVE AV Area (Vmax):    1.52 cm AV Area (Vmean):   1.60 cm AV Area (VTI):     1.44 cm AV Vmax:           175.00 cm/s AV Vmean:          106.000 cm/s AV VTI:            0.253 m AV Peak Grad:      12.2 mmHg AV Mean Grad:      5.0 mmHg LVOT Vmax:         84.90 cm/s LVOT Vmean:        54.100 cm/s LVOT VTI:          0.116 m LVOT/AV VTI ratio: 0.46  AORTA Ao Root diam: 3.50 cm Ao Asc diam:  3.60 cm MITRAL VALVE MV Area (PHT): 2.91 cm    SHUNTS MV Decel Time: 261 msec    Systemic VTI:  0.12 m MV E velocity: 55.30 cm/s  Systemic Diam: 2.00 cm MV A velocity: 87.40 cm/s MV E/A ratio:  0.63 Laurance Flatten MD Electronically signed by Laurance Flatten MD Signature  Date/Time: 01/31/2021/3:52:46 PM    Final     Microbiology Recent Results (from the past 240 hour(s))  Resp Panel by RT-PCR (Flu A&B, Covid) Nasopharyngeal Swab     Status: None   Collection Time: 01/28/2021  4:25 PM   Specimen: Nasopharyngeal Swab; Nasopharyngeal(NP) swabs in vial transport medium  Result Value Ref Range Status   SARS Coronavirus 2 by RT PCR NEGATIVE NEGATIVE Final    Comment: (NOTE) SARS-CoV-2 target nucleic acids are NOT DETECTED.  The SARS-CoV-2 RNA is generally detectable in upper respiratory specimens during the acute phase of infection. The lowest concentration of SARS-CoV-2 viral copies this assay  can detect is 138 copies/mL. A negative result does not preclude SARS-Cov-2 infection and should not be used as the sole basis for treatment or other patient management decisions. A negative result may occur with  improper specimen collection/handling, submission of specimen other than nasopharyngeal swab, presence of viral mutation(s) within the areas targeted by this assay, and inadequate number of viral copies(<138 copies/mL). A negative result must be combined with clinical observations, patient history, and epidemiological information. The expected result is Negative.  Fact Sheet for Patients:  BloggerCourse.com  Fact Sheet for Healthcare Providers:  SeriousBroker.it  This test is no t yet approved or cleared by the [place] FDA and  has been authorized for detection and/or diagnosis of SARS-CoV-2 by FDA under an Emergency Use Authorization (EUA). This EUA will remain  in effect (meaning this test can be used) for the duration of the COVID-19 declaration under Section 564(b)(1) of the Act, 21 U.S.C.section 360bbb-3(b)(1), unless the authorization is terminated  or revoked sooner.       Influenza A by PCR NEGATIVE NEGATIVE Final   Influenza B by PCR NEGATIVE NEGATIVE Final    Comment: (NOTE) The Xpert Xpress SARS-CoV-2/FLU/RSV plus assay is intended as an aid in the diagnosis of influenza from Nasopharyngeal swab specimens and should not be used as a sole basis for treatment. Nasal washings and aspirates are unacceptable for Xpert Xpress SARS-CoV-2/FLU/RSV testing.  Fact Sheet for Patients: BloggerCourse.com  Fact Sheet for Healthcare Providers: SeriousBroker.it  This test is not yet approved or cleared by the [place] FDA and has been authorized for detection and/or diagnosis of SARS-CoV-2 by FDA under an Emergency Use Authorization (EUA). This EUA will  remain in effect (meaning this test can be used) for the duration of the COVID-19 declaration under Section 564(b)(1) of the Act, 21 U.S.C. section 360bbb-3(b)(1), unless the authorization is terminated or revoked.  Performed at [facility], [address] [place], [place] [address]   Blood Culture (routine x 2)     Status: None   Collection Time: 01/17/2021  4:25 PM   Specimen: BLOOD  Result Value Ref Range Status   Specimen Description BLOOD LEFT ANTECUBITAL  Final   Special Requests   Final    BOTTLES DRAWN AEROBIC AND ANAEROBIC Blood Culture results may not be optimal due to an inadequate volume of blood received in culture bottles   Culture   Final    NO GROWTH 5 DAYS Performed at [facility], [address], [place], [place] [address]    Report Status 02/04/2021 FINAL  Final  Blood Culture (routine x 2)     Status: None   Collection Time: 02/05/2021  4:30 PM   Specimen: BLOOD  Result Value Ref Range Status   Specimen Description BLOOD RIGHT ANTECUBITAL  Final   Special Requests   Final  BOTTLES DRAWN AEROBIC AND ANAEROBIC Blood Culture results may not be optimal due to an inadequate volume of blood received in culture bottles   Culture   Final    NO GROWTH 5 DAYS Performed at [facility], [address], [place], [place] [address]    Report Status 02/04/2021 FINAL  Final    Lab Basic Metabolic Panel: Recent Labs  Lab 02/02/21 2238 02/03/21 0220 02/04/21 0312 02/05/21 0212 02/06/21 0159 02/07/21 1057  NA 138 139 139 140 142 141  K 4.1 4.2 3.8 4.2 4.0 4.0  CL 98 102 99 98 98 98  CO2 32 36* 33*  GLUCOSE 97 102* 106* 142* 100* 150*  BUN 27* 28* 44* 55* 51* 44*  CREATININE 1.08 1.07 1.17 1.44* 1.22 1.26*  CALCIUM 8.2* 8.4* 8.4* 8.6* 8.5* 8.5*  MG 2.0  --   --   --   --   --   PHOS 2.1*  --   --   --   --   --    Liver Function Tests: Recent Labs  Lab 02/02/21 2238  ALBUMIN 2.6*   No results for input(s): LIPASE, AMYLASE in  the last 168 hours. No results for input(s): AMMONIA in the last 168 hours. CBC: Recent Labs  Lab 02/02/21 1241 02/02/21 2238 02/03/21 0220 02/04/21 0312 02/06/21 0159 02/07/21 1057 Feb 22, 2021 1426  WBC 11.7* 12.4* 14.3* 12.7* 14.0*  --   --   NEUTROABS  --  9.5*  --   --   --   --   --   HGB 8.1* 8.5* 8.6* 8.8* 9.2* 9.4* 9.8*  HCT 25.0* 26.0* 26.8* 27.6* 29.7* 30.8* 32.7*  MCV 101.6* 100.8* 101.9* 103.0* 103.5*  --   --   PLT 148* 166 166 200 265  --   --    Cardiac Enzymes: No results for input(s): CKTOTAL, CKMB, CKMBINDEX, TROPONINI in the last 168 hours. Sepsis Labs: Recent Labs  Lab 02/02/21 2238 02/03/21 0220 02/04/21 0312 02/06/21 0159  WBC 12.4* 14.3* 12.7* 14.0*    Procedures/Operations   Shareeka Yim 02-22-21, 6:19 PM

## 2021-02-13 DEATH — deceased
